# Patient Record
Sex: Female | Born: 1965
Health system: Southern US, Community
[De-identification: ages and names within clinical notes are randomized; demographics above are authoritative.]

## PROBLEM LIST (undated history)

## (undated) DIAGNOSIS — IMO0001 Reserved for inherently not codable concepts without codable children: Secondary | ICD-10-CM

## (undated) DIAGNOSIS — K219 Gastro-esophageal reflux disease without esophagitis: Secondary | ICD-10-CM

## (undated) DIAGNOSIS — T4145XA Adverse effect of unspecified anesthetic, initial encounter: Secondary | ICD-10-CM

## (undated) DIAGNOSIS — F329 Major depressive disorder, single episode, unspecified: Secondary | ICD-10-CM

## (undated) DIAGNOSIS — C50919 Malignant neoplasm of unspecified site of unspecified female breast: Secondary | ICD-10-CM

## (undated) DIAGNOSIS — D649 Anemia, unspecified: Secondary | ICD-10-CM

## (undated) DIAGNOSIS — T7840XA Allergy, unspecified, initial encounter: Secondary | ICD-10-CM

## (undated) DIAGNOSIS — T8859XA Other complications of anesthesia, initial encounter: Secondary | ICD-10-CM

## (undated) DIAGNOSIS — N189 Chronic kidney disease, unspecified: Secondary | ICD-10-CM

## (undated) DIAGNOSIS — I10 Essential (primary) hypertension: Secondary | ICD-10-CM

## (undated) DIAGNOSIS — Z9889 Other specified postprocedural states: Secondary | ICD-10-CM

## (undated) DIAGNOSIS — F32A Depression, unspecified: Secondary | ICD-10-CM

## (undated) DIAGNOSIS — R112 Nausea with vomiting, unspecified: Secondary | ICD-10-CM

## (undated) DIAGNOSIS — Z5189 Encounter for other specified aftercare: Secondary | ICD-10-CM

## (undated) DIAGNOSIS — F419 Anxiety disorder, unspecified: Secondary | ICD-10-CM

## (undated) HISTORY — DX: Major depressive disorder, single episode, unspecified: F32.9

## (undated) HISTORY — DX: Malignant neoplasm of unspecified site of unspecified female breast: C50.919

## (undated) HISTORY — DX: Essential (primary) hypertension: I10

## (undated) HISTORY — DX: Allergy, unspecified, initial encounter: T78.40XA

## (undated) HISTORY — PX: BREAST BIOPSY: SHX20

## (undated) HISTORY — DX: Depression, unspecified: F32.A

## (undated) HISTORY — PX: BREAST RECONSTRUCTION: SHX9

## (undated) HISTORY — DX: Encounter for other specified aftercare: Z51.89

## (undated) HISTORY — DX: Anxiety disorder, unspecified: F41.9

## (undated) HISTORY — PX: BACK SURGERY: SHX140

## (undated) HISTORY — PX: TONSILLECTOMY: SUR1361

---

## 1999-10-16 HISTORY — PX: ABDOMINAL HYSTERECTOMY: SHX81

## 2006-05-16 ENCOUNTER — Ambulatory Visit: Payer: Self-pay | Admitting: Unknown Physician Specialty

## 2006-06-07 ENCOUNTER — Ambulatory Visit: Payer: Self-pay | Admitting: Unknown Physician Specialty

## 2006-12-10 ENCOUNTER — Ambulatory Visit: Payer: Self-pay | Admitting: Unknown Physician Specialty

## 2007-06-10 ENCOUNTER — Ambulatory Visit: Payer: Self-pay | Admitting: Surgery

## 2007-06-13 ENCOUNTER — Ambulatory Visit: Payer: Self-pay | Admitting: Surgery

## 2007-09-04 ENCOUNTER — Ambulatory Visit: Payer: Self-pay

## 2008-05-12 ENCOUNTER — Ambulatory Visit: Payer: Self-pay | Admitting: Unknown Physician Specialty

## 2008-06-14 ENCOUNTER — Ambulatory Visit: Payer: Self-pay | Admitting: Surgery

## 2008-09-13 ENCOUNTER — Ambulatory Visit: Payer: Self-pay | Admitting: Unknown Physician Specialty

## 2009-06-13 ENCOUNTER — Ambulatory Visit: Payer: Self-pay | Admitting: Unknown Physician Specialty

## 2009-06-16 ENCOUNTER — Ambulatory Visit: Payer: Self-pay | Admitting: Surgery

## 2010-01-04 ENCOUNTER — Ambulatory Visit: Payer: Self-pay | Admitting: Unknown Physician Specialty

## 2010-06-20 ENCOUNTER — Ambulatory Visit: Payer: Self-pay | Admitting: Surgery

## 2010-10-23 ENCOUNTER — Ambulatory Visit: Payer: Self-pay | Admitting: Internal Medicine

## 2011-07-04 ENCOUNTER — Ambulatory Visit: Payer: Self-pay | Admitting: Surgery

## 2011-12-24 ENCOUNTER — Ambulatory Visit: Payer: Self-pay | Admitting: Surgery

## 2012-12-24 ENCOUNTER — Ambulatory Visit: Payer: Self-pay | Admitting: Surgery

## 2013-09-21 ENCOUNTER — Ambulatory Visit: Payer: Self-pay | Admitting: Physician Assistant

## 2013-12-29 ENCOUNTER — Ambulatory Visit: Payer: Self-pay | Admitting: Surgery

## 2015-01-12 ENCOUNTER — Encounter: Payer: Self-pay | Admitting: *Deleted

## 2015-08-01 ENCOUNTER — Other Ambulatory Visit: Payer: Self-pay | Admitting: Surgery

## 2015-08-01 DIAGNOSIS — N63 Unspecified lump in unspecified breast: Secondary | ICD-10-CM

## 2015-08-01 DIAGNOSIS — Z9289 Personal history of other medical treatment: Secondary | ICD-10-CM

## 2015-08-01 DIAGNOSIS — Z1231 Encounter for screening mammogram for malignant neoplasm of breast: Secondary | ICD-10-CM

## 2015-08-12 ENCOUNTER — Other Ambulatory Visit: Payer: Self-pay | Admitting: Surgery

## 2015-08-12 ENCOUNTER — Ambulatory Visit
Admission: RE | Admit: 2015-08-12 | Discharge: 2015-08-12 | Disposition: A | Discharge status: Home / Self Care | Payer: 59 | Source: Ambulatory Visit | Attending: Surgery | Admitting: Surgery

## 2015-08-12 DIAGNOSIS — N63 Unspecified lump in unspecified breast: Secondary | ICD-10-CM

## 2015-08-12 DIAGNOSIS — R920 Mammographic microcalcification found on diagnostic imaging of breast: Secondary | ICD-10-CM | POA: Diagnosis not present

## 2015-08-15 ENCOUNTER — Telehealth: Payer: Self-pay | Admitting: Surgery

## 2015-08-15 DIAGNOSIS — R921 Mammographic calcification found on diagnostic imaging of breast: Secondary | ICD-10-CM

## 2015-08-15 NOTE — Telephone Encounter (Signed)
Telephone conversation with Emma Atkinson. She stated that her mammogram was abnormal and they were suggesting biopsy in the breast Center. I have not been able to review her films personally but have reviewed the reports and made in in agreement with right ultrasound-guided core biopsy which is been ordered. I will follow up the results with her.

## 2015-08-16 ENCOUNTER — Other Ambulatory Visit: Payer: Self-pay | Admitting: Surgery

## 2015-08-16 DIAGNOSIS — N63 Unspecified lump in unspecified breast: Secondary | ICD-10-CM

## 2015-08-17 ENCOUNTER — Other Ambulatory Visit: Payer: Self-pay | Admitting: Surgery

## 2015-08-17 ENCOUNTER — Ambulatory Visit
Admission: RE | Admit: 2015-08-17 | Discharge: 2015-08-17 | Disposition: A | Discharge status: Home / Self Care | Payer: 59 | Source: Ambulatory Visit | Attending: Surgery | Admitting: Surgery

## 2015-08-17 DIAGNOSIS — N63 Unspecified lump in unspecified breast: Secondary | ICD-10-CM

## 2015-08-17 DIAGNOSIS — C50911 Malignant neoplasm of unspecified site of right female breast: Secondary | ICD-10-CM | POA: Insufficient documentation

## 2015-08-17 DIAGNOSIS — C50811 Malignant neoplasm of overlapping sites of right female breast: Secondary | ICD-10-CM | POA: Diagnosis not present

## 2015-08-17 DIAGNOSIS — C50919 Malignant neoplasm of unspecified site of unspecified female breast: Secondary | ICD-10-CM | POA: Insufficient documentation

## 2015-08-19 LAB — SURGICAL PATHOLOGY

## 2015-08-22 ENCOUNTER — Inpatient Hospital Stay: Payer: 59

## 2015-08-22 ENCOUNTER — Inpatient Hospital Stay: Payer: 59 | Attending: Hematology and Oncology | Admitting: Hematology and Oncology

## 2015-08-22 ENCOUNTER — Encounter: Payer: Self-pay | Admitting: Hematology and Oncology

## 2015-08-22 VITALS — BP 154/75 | HR 81 | Temp 96.6°F | Resp 18

## 2015-08-22 DIAGNOSIS — N390 Urinary tract infection, site not specified: Secondary | ICD-10-CM | POA: Diagnosis not present

## 2015-08-22 DIAGNOSIS — Z79899 Other long term (current) drug therapy: Secondary | ICD-10-CM | POA: Diagnosis not present

## 2015-08-22 DIAGNOSIS — F329 Major depressive disorder, single episode, unspecified: Secondary | ICD-10-CM | POA: Diagnosis not present

## 2015-08-22 DIAGNOSIS — F419 Anxiety disorder, unspecified: Secondary | ICD-10-CM | POA: Insufficient documentation

## 2015-08-22 DIAGNOSIS — Z170 Estrogen receptor positive status [ER+]: Secondary | ICD-10-CM | POA: Insufficient documentation

## 2015-08-22 DIAGNOSIS — C50811 Malignant neoplasm of overlapping sites of right female breast: Secondary | ICD-10-CM | POA: Insufficient documentation

## 2015-08-22 DIAGNOSIS — Z8041 Family history of malignant neoplasm of ovary: Secondary | ICD-10-CM | POA: Diagnosis not present

## 2015-08-22 DIAGNOSIS — Z803 Family history of malignant neoplasm of breast: Secondary | ICD-10-CM | POA: Diagnosis not present

## 2015-08-22 DIAGNOSIS — D242 Benign neoplasm of left breast: Secondary | ICD-10-CM | POA: Diagnosis not present

## 2015-08-22 DIAGNOSIS — Z7689 Persons encountering health services in other specified circumstances: Secondary | ICD-10-CM | POA: Insufficient documentation

## 2015-08-22 DIAGNOSIS — R232 Flushing: Secondary | ICD-10-CM | POA: Diagnosis not present

## 2015-08-22 DIAGNOSIS — Z90710 Acquired absence of both cervix and uterus: Secondary | ICD-10-CM | POA: Insufficient documentation

## 2015-08-22 DIAGNOSIS — C50111 Malignant neoplasm of central portion of right female breast: Secondary | ICD-10-CM

## 2015-08-22 DIAGNOSIS — Z5111 Encounter for antineoplastic chemotherapy: Secondary | ICD-10-CM | POA: Insufficient documentation

## 2015-08-22 DIAGNOSIS — Z87891 Personal history of nicotine dependence: Secondary | ICD-10-CM | POA: Diagnosis not present

## 2015-08-22 LAB — COMPREHENSIVE METABOLIC PANEL
ALT: 19 U/L (ref 14–54)
AST: 23 U/L (ref 15–41)
Albumin: 4.6 g/dL (ref 3.5–5.0)
Alkaline Phosphatase: 55 U/L (ref 38–126)
Anion gap: 7 (ref 5–15)
BUN: 12 mg/dL (ref 6–20)
CO2: 24 mmol/L (ref 22–32)
Calcium: 10.1 mg/dL (ref 8.9–10.3)
Chloride: 103 mmol/L (ref 101–111)
Creatinine, Ser: 0.73 mg/dL (ref 0.44–1.00)
GFR calc Af Amer: >60 mL/min (ref >60)
GFR calc non Af Amer: >60 mL/min (ref >60)
Glucose, Bld: 99 mg/dL (ref 65–99)
Potassium: 3.8 mmol/L (ref 3.5–5.1)
Sodium: 134 mmol/L — ABNORMAL LOW (ref 135–145)
Total Bilirubin: 0.9 mg/dL (ref 0.3–1.2)
Total Protein: 7.8 g/dL (ref 6.5–8.1)

## 2015-08-22 LAB — CBC WITH DIFFERENTIAL/PLATELET
Basophils Absolute: 0 10*3/uL (ref 0–0.1)
Basophils Relative: 0 %
Eosinophils Absolute: 0 10*3/uL (ref 0–0.7)
Eosinophils Relative: 1 %
HCT: 40.8 % (ref 35.0–47.0)
Hemoglobin: 13.7 g/dL (ref 12.0–16.0)
Lymphocytes Relative: 18 %
Lymphs Abs: 1.8 10*3/uL (ref 1.0–3.6)
MCH: 31.1 pg (ref 26.0–34.0)
MCHC: 33.6 g/dL (ref 32.0–36.0)
MCV: 92.4 fL (ref 80.0–100.0)
Monocytes Absolute: 0.4 10*3/uL (ref 0.2–0.9)
Monocytes Relative: 4 %
Neutro Abs: 7.9 10*3/uL — ABNORMAL HIGH (ref 1.4–6.5)
Neutrophils Relative %: 77 %
Platelets: 371 10*3/uL (ref 150–440)
RBC: 4.42 MIL/uL (ref 3.80–5.20)
RDW: 12.9 % (ref 11.5–14.5)
WBC: 10.2 10*3/uL (ref 3.6–11.0)

## 2015-08-22 NOTE — Progress Notes (Signed)
[facility] day:  08/22/2015  Chief Complaint: Emma Atkinson is a 49 y.o. female with clinical stage IIB right breast cancer s/p biopsy who is referred in consultation by Dr. Rochel Brome for assessment and management.  HPI:  The patient has had regular mammograms. She has noticed cysts now and then. As. She was due for follow-up mammogram in 12/2014 at. Mammogram was delayed. She then a right a right breast mass getting larger with inversion of the nipple. Mass was uncomfortable. She thus sought medical attention.  Bilateral mammogram and ultrasound on 08/12/2015 revealed a 4.1 x 2.3 x 4.0 cm cm highly suspicious right superior breast mass containing pleomorphic microcalcifications. There was increased internal vascularity.  There were several satellite lesions (1.3 cm, 0.9 cm, 0.6 cm) plus a 1.3 cm mass versus complicated cyst and a 0.8 cm cyst.  There was associated right axillary adenopathy (one 5 mm node with irregular thickened cortex).   In the left breast was 1.5 cm cyst containing intramural nodule versus debris at the 9:00 position 2 cm from the nipple. There was also an indeterminate left breast 7 mm nodule at the 2:00 position 4 cm from the nipple.  On 08/17/2015, she underwent core needle biopsy at the 12 o'clock position in the right breast.  Pathology revealed grade 3 invasive mammary carcinoma of no special type. Core sample was 1.2 cm. Right axillary biopsy confirmed metastatic mammary carcinoma. Left breast biopsy at the 9:00 position revealed intraductal papilloma with fibrocystic changes. Tumor was ER positive (1-10%), PR positive (11-50%) and HER-2/neu 3+  The patient notes a hysterectomy about 13 to 15 years ago. Her ovaries remain in place. She has an intermittent hot flashes. Menses was at age 61. First pregnancy at age 47. She did not breast feed her children.  She was on birth control pills from age 43 to [age].  She has not been on  any hormone replacement since her hysterectomy. She has 2 daughters who are alive and well.  Patient has a family history of breast cancer.  A maternal aunt had breast cancer at age 59. A maternal second cousin has breast cancer. A maternal aunt had ovarian cancer at age 71.  Past Medical History  Diagnosis Date  . Breast cancer ([place])   . Anxiety   . Depression   . Anemia     H/O  . Complication of anesthesia     HARD TO WAKE UP AFTER TONSILLECTOMY AS A CHILD BUT NO PROBLEMS SINCE    Past Surgical History  Procedure Laterality Date  . Breast biopsy Bilateral     08/17/2015   . Abdominal hysterectomy  2001  . Tonsillectomy    . Back surgery      FOR SCOLIOSIS-HERRINGTON RODS IN PLACE  . Portacath placement N/A 08/30/2015    Procedure: INSERTION PORT-A-CATH;  Surgeon: Leonie Green, MD;  Location: [facility] ORS;  Service: General;  Laterality: N/A;    Family History  Problem Relation Age of Onset  . Breast cancer Maternal Aunt 64  . Cancer Father   . Cancer Sister   . Cancer Maternal Grandfather     Social History:  reports that she quit smoking about 15 years ago. Her smoking use included Cigarettes. She has a 10 pack-year smoking history. She does not have any smokeless tobacco history on file. She reports that she drinks alcohol. She reports that she does not use illicit drugs.  The patient is accompanied  by her husband and mother today.  Allergies:  Allergies  Allergen Reactions  . Ceftin [Cefuroxime Axetil] Rash    Current Medications: Current Outpatient Prescriptions  Medication Sig Dispense Refill  . ALPRAZolam (XANAX) 0.25 MG tablet Take 1/4 tablet up to three times daily as needed    . diphenhydrAMINE (SOMINEX) 25 MG tablet Take 25 mg by mouth at bedtime.     Marland Kitchen HYDROcodone-acetaminophen (NORCO) 5-325 MG tablet Take 1-2 tablets by mouth every 4 (four) hours as needed for moderate pain. 12 tablet 0  . Multiple Vitamin (MULTIVITAMIN) capsule Take 1 capsule by  mouth daily.    Marland Kitchen triprolidine-pseudoephedrine (APRODINE) 2.5-60 MG TABS tablet Take 1 tablet by mouth every 6 (six) hours as needed for allergies.     No current facility-administered medications for this visit.    Review of Systems:  GENERAL:  Feels good.  Active.  No fevers, sweats or weight loss. PERFORMANCE STATUS (ECOG):  0 HEENT:  No visual changes, runny nose, sore throat, mouth sores or tenderness. Lungs: No shortness of breath or cough.  No hemoptysis. Cardiac:  No chest pain, palpitations, orthopnea, or PND. GI:  No nausea, vomiting, diarrhea, constipation, melena or hematochezia. GU:  No urgency, frequency, dysuria, or hematuria. Musculoskeletal:  No back pain.  No joint pain.  No muscle tenderness. Extremities:  No pain or swelling. Skin:  No rashes or skin changes. Neuro:  Sinus headaches.  No numbness or weakness, balance or coordination issues. Endocrine:  No diabetes, thyroid issues, hot flashes or night sweats. Psych:  Nervous/anxious.  No mood changes or depression. Pain:  No focal pain. Review of systems:  All other systems reviewed and found to be negative.  Physical Exam: Blood pressure 154/75, pulse 81, temperature 96.6 F (35.9 C), temperature source Tympanic, resp. rate 18. GENERAL:  Well developed, well nourished, sitting comfortably in the exam room in no acute distress. MENTAL STATUS:  Alert and oriented to person, place and time. HEAD:  Blonde frosted hair.  Normocephalic, atraumatic, face symmetric, no Cushingoid features. EYES:  Blue eyes.  Pupils equal round and reactive to light and accomodation.  No conjunctivitis or scleral icterus. ENT:  Oropharynx clear without lesion.  Tongue normal. Mucous membranes moist.  RESPIRATORY:  Clear to auscultation without rales, wheezes or rhonchi. CARDIOVASCULAR:  Regular rate and rhythm without murmur, rub or gallop. BREAST:  Right central breast mass measuring approximately 5 cm with associated nipple deformity.   No skin changes or nipple discharge.  Left breast with 2 cm mass at the 8-9 o'clock position and additional mass-like firmness in the 3 o'clock position. No skin changes or nipple discharge.  Steri-strips in right axillary region, right breast, and left breast. ABDOMEN:  Soft, non-tender, with active bowel sounds, and no hepatosplenomegaly.  No masses. BACK:  Scoliosis.  Long well healed midline scar. SKIN:  No rashes, ulcers or lesions. EXTREMITIES: No edema, no skin discoloration or tenderness.  No palpable cords. LYMPH NODES: No palpable cervical, supraclavicular, axillary or inguinal adenopathy  NEUROLOGICAL: Unremarkable. PSYCH:  Appropriate.  Appointment on 08/22/2015  Component Date Value Ref Range Status  . WBC 08/22/2015 10.2  3.6 - 11.0 K/uL Final  . RBC 08/22/2015 4.42  3.80 - 5.20 MIL/uL Final  . Hemoglobin 08/22/2015 13.7  12.0 - 16.0 g/dL Final  . HCT 08/22/2015 40.8  35.0 - 47.0 % Final  . MCV 08/22/2015 92.4  80.0 - 100.0 fL Final  . MCH 08/22/2015 31.1  26.0 - 34.0 pg Final  .  MCHC 08/22/2015 33.6  32.0 - 36.0 g/dL Final  . RDW 08/22/2015 12.9  11.5 - 14.5 % Final  . Platelets 08/22/2015 371  150 - 440 K/uL Final  . Neutrophils Relative % 08/22/2015 77   Final  . Neutro Abs 08/22/2015 7.9* 1.4 - 6.5 K/uL Final  . Lymphocytes Relative 08/22/2015 18   Final  . Lymphs Abs 08/22/2015 1.8  1.0 - 3.6 K/uL Final  . Monocytes Relative 08/22/2015 4   Final  . Monocytes Absolute 08/22/2015 0.4  0.2 - 0.9 K/uL Final  . Eosinophils Relative 08/22/2015 1   Final  . Eosinophils Absolute 08/22/2015 0.0  0 - 0.7 K/uL Final  . Basophils Relative 08/22/2015 0   Final  . Basophils Absolute 08/22/2015 0.0  0 - 0.1 K/uL Final  . Sodium 08/22/2015 134* 135 - 145 mmol/L Final  . Potassium 08/22/2015 3.8  3.5 - 5.1 mmol/L Final  . Chloride 08/22/2015 103  101 - 111 mmol/L Final  . CO2 08/22/2015 24  22 - 32 mmol/L Final  . Glucose, Bld 08/22/2015 99  65 - 99 mg/dL Final  . BUN 08/22/2015  12  6 - 20 mg/dL Final  . Creatinine, Ser 08/22/2015 0.73  0.44 - 1.00 mg/dL Final  . Calcium 08/22/2015 10.1  8.9 - 10.3 mg/dL Final  . Total Protein 08/22/2015 7.8  6.5 - 8.1 g/dL Final  . Albumin 08/22/2015 4.6  3.5 - 5.0 g/dL Final  . AST 08/22/2015 23  15 - 41 U/L Final  . ALT 08/22/2015 19  14 - 54 U/L Final  . Alkaline Phosphatase 08/22/2015 55  38 - 126 U/L Final  . Total Bilirubin 08/22/2015 0.9  0.3 - 1.2 mg/dL Final  . GFR calc non Af Amer 08/22/2015 >60  >60 mL/min Final  . GFR calc Af Amer 08/22/2015 >60  >60 mL/min Final   Comment: (NOTE) The eGFR has been calculated using the CKD EPI equation. This calculation has not been validated in all clinical situations. eGFR's persistently <60 mL/min signify possible Chronic Kidney Disease.   . Anion gap 08/22/2015 7  5 - 15 Final  . CA 27.29 08/22/2015 19.6  0.0 - 38.6 U/mL Final   Comment: (NOTE) [employer]ayer Cent[employer]/ACS methodology Performed At: [facility] [place], [place] [id number] Lindon Romp MD MS:[id number]   . [facility] 08/22/2015 5.0   Final   Comment: (NOTE)                    Follicular phase        3.5 -  12.5                    Ovulation phase         4.7 -  21.5                    Luteal phase            1.7 -   7.7                    Postmenopausal         25.8 - 134.8 Performed At: [facility] [place], [place] [id number] Lindon Romp MD LP:[id number]   . Estradiol 08/22/2015 271.9   Final   Comment: (NOTE)                    Adult Female:  Follicular phase   16.1 -   166.0                      Ovulation phase    85.8 -   498.0                      Luteal phase       43.8 -   211.0                      Postmenopausal     <6.0 -    54.7                    Pregnancy                      1st trimester     215.0 - >4300.0                    Girls (1-10 years)    6.0 -    27.0 Roche ECLIA methodology Performed At: [facility] [place], [place] [id number] Lindon Romp MD WJ:[id number]     Assessment:  Emma Atkinson is a 49 y.o. female with clinical stage T2N1 (stage IIB) Her2/neu + right breast cancer status post biopsy.  She presented with a minimally painful right breast mass with nipple inversion.  Bilateral mammogram and ultrasound on 08/12/2015 revealed a 4.1 cm right superior breast mass containing pleomorphic microcalcifications. There were several satellite lesions (1.3 cm, 0.9 cm, 0.6 cm) plus a 1.3 cm mass versus complicated cyst and a 0.8 cm cyst.  There was one 5 mm right axillary node with irregular thickened cortex.   Left breast revealed a 1.5 cm cyst containing intramural nodule versus debris at the 9:00 position 2 cm from the nipple. There was also an indeterminate left breast 7 mm nodule at the 2:00 position 4 cm from the nipple.  Left breast biopsy at the 9:00 position on 08/17/2015 revealed intraductal papilloma with fibrocystic changes.   Core needle biopsy on 08/17/2015 of the right breast mass revealed a grade 3 invasive mammary carcinoma. Right axillary biopsy confirmed metastatic mammary carcinoma. Tumor was ER positive (1-10%), PR positive (11-50%) and HER-2/neu 3+  She is s/p partial hysterectomy.  She has a family history of breast and ovarian cancer.  Symptomatically, she feels well.  Exam reveals a 5 cm  Right breast mass and significant changes in the left breast.  Plan: 1.  Review pathology and diagnosis of breast cancer.  Discuss staging.  Clinical stage IIB.  Discuss additional staging studies (PET scan).  Discuss My Risk genetic testing.  Discuss neoadjuvant versus adjuvant chemotherapy.  Discuss likely right mastectomy with axillary lymph node dissection.  Discuss aggressive nature of Her2/neu + disease.  Discuss in vivo response with neoadjuvant chemotherapy.  Discuss additional imaging of left breast.  Patient may not be a candidate for breast MRI given Herrington rods.   Discuss issues with following left breast.  Patient considering bilateral mastectomy.  2.  Discuss neoadjuant chemotherapy (AC q 2-3 weeks x 4 cycles with Neulasta support followed by weekly Taxol x 12 plus Perjeta x 4 cycles every 3 weeks with Herceptin x 1 year).  Discuss TCH x 6 cycles (Taxotere, carboplatin, Herceptin) followed by additional Herceptin to complete a year of Hereptin.  Side effects of chemotherapy were reviewed in detail.  Discuss surgery following  chemotherapy portion of treatment.  Discuss radiation therapy consult.  Discuss hormonal therapy (tamoxifen versus aromatase inhibitor).  Side effects reviewed.  Discuss assessing menopausal status (FSH, estradiol).  3.  Information on Adriamycin, Cytoxan, Taxol, Taxotere, carboplatin, Herceptin, and Perjeta.  4.  Schedule chemotherapy class. 5.  Labs today:  CBC with diff, CMP, CA27.29, FSH, estradiol. 6.  My Risk genetic testing. 7.  Schedule PET scan. 8.  Schedule echocardiogram- assess EF. 9.  Surgical consult (Dr Rochel Brome) for port-a-cath placement. 10. RTC after above to finalize treatment plan.   Lequita Asal, MD  08/22/2015, 8:50 AM

## 2015-08-22 NOTE — Patient Instructions (Signed)
Trastuzumab injection for infusion What is this medicine? TRASTUZUMAB (tras TOO zoo mab) is a monoclonal antibody. It is used to treat breast cancer and stomach cancer. This medicine may be used for other purposes; ask your health care provider or pharmacist if you have questions. What should I tell my health care provider before I take this medicine? They need to know if you have any of these conditions: -heart disease -heart failure -infection (especially a virus infection such as chickenpox, cold sores, or herpes) -lung or breathing disease, like asthma -recent or ongoing radiation therapy -an unusual or allergic reaction to trastuzumab, benzyl alcohol, or other medications, foods, dyes, or preservatives -pregnant or trying to get pregnant -breast-feeding How should I use this medicine? This drug is given as an infusion into a vein. It is administered in a hospital or clinic by a specially trained health care professional. Talk to your pediatrician regarding the use of this medicine in children. This medicine is not approved for use in children. Overdosage: If you think you have taken too much of this medicine contact a poison control center or emergency room at once. NOTE: This medicine is only for you. Do not share this medicine with others. What if I miss a dose? It is important not to miss a dose. Call your doctor or health care professional if you are unable to keep an appointment. What may interact with this medicine? -doxorubicin -warfarin This list may not describe all possible interactions. Give your health care provider a list of all the medicines, herbs, non-prescription drugs, or dietary supplements you use. Also tell them if you smoke, drink alcohol, or use illegal drugs. Some items may interact with your medicine. What should I watch for while using this medicine? Visit your doctor for checks on your progress. Report any side effects. Continue your course of treatment even  though you feel ill unless your doctor tells you to stop. Call your doctor or health care professional for advice if you get a fever, chills or sore throat, or other symptoms of a cold or flu. Do not treat yourself. Try to avoid being around people who are sick. You may experience fever, chills and shaking during your first infusion. These effects are usually mild and can be treated with other medicines. Report any side effects during the infusion to your health care professional. Fever and chills usually do not happen with later infusions. Do not become pregnant while taking this medicine or for 7 months after stopping it. Women should inform their doctor if they wish to become pregnant or think they might be pregnant. Women of child-bearing potential will need to have a negative pregnancy test before starting this medicine. There is a potential for serious side effects to an unborn child. Talk to your health care professional or pharmacist for more information. Do not breast-feed an infant while taking this medicine or for 7 months after stopping it. Women must use effective birth control with this medicine. What side effects may I notice from receiving this medicine? Side effects that you should report to your doctor or other health care professional as soon as possible: -breathing difficulties -chest pain or palpitations -cough -dizziness or fainting -fever or chills, sore throat -skin rash, itching or hives -swelling of the legs or ankles -unusually weak or tired Side effects that usually do not require medical attention (report to your doctor or other health care professional if they continue or are bothersome): -loss of appetite -headache -muscle aches -nausea This   list may not describe all possible side effects. Call your doctor for medical advice about side effects. You may report side effects to FDA at 1-800-FDA-1088. Where should I keep my medicine? This drug is given in a hospital  or clinic and will not be stored at home. NOTE: This sheet is a summary. It may not cover all possible information. If you have questions about this medicine, talk to your doctor, pharmacist, or health care provider.    2016, Elsevier/[employer] Standard. (2015-01-07 11:49:32) Pertuzumab injection What is this medicine? PERTUZUMAB (per TOOZ ue mab) is a monoclonal antibody. It is used to treat breast cancer. This medicine may be used for other purposes; ask your health care provider or pharmacist if you have questions. What should I tell my health care provider before I take this medicine? They need to know if you have any of these conditions: -heart disease -heart failure -high blood pressure -history of irregular heart beat -recent or ongoing radiation therapy -an unusual or allergic reaction to pertuzumab, other medicines, foods, dyes, or preservatives -pregnant or trying to get pregnant -breast-feeding How should I use this medicine? This medicine is for infusion into a vein. It is given by a health care professional in a hospital or clinic setting. Talk to your pediatrician regarding the use of this medicine in children. Special care may be needed. Overdosage: If you think you have taken too much of this medicine contact a poison control center or emergency room at once. NOTE: This medicine is only for you. Do not share this medicine with others. What if I miss a dose? It is important not to miss your dose. Call your doctor or health care professional if you are unable to keep an appointment. What may interact with this medicine? Interactions are not expected. Give your health care provider a list of all the medicines, herbs, non-prescription drugs, or dietary supplements you use. Also tell them if you smoke, drink alcohol, or use illegal drugs. Some items may interact with your medicine. This list may not describe all possible interactions. Give your health care provider a list of all  the medicines, herbs, non-prescription drugs, or dietary supplements you use. Also tell them if you smoke, drink alcohol, or use illegal drugs. Some items may interact with your medicine. What should I watch for while using this medicine? Your condition will be monitored carefully while you are receiving this medicine. Report any side effects. Continue your course of treatment even though you feel ill unless your doctor tells you to stop. Do not become pregnant while taking this medicine or for 7 months after stopping it. Women should inform their doctor if they wish to become pregnant or think they might be pregnant. Women of child-bearing potential will need to have a negative pregnancy test before starting this medicine. There is a potential for serious side effects to an unborn child. Talk to your health care professional or pharmacist for more information. Do not breast-feed an infant while taking this medicine or for 7 months after stopping it. Women must use effective birth control with this medicine. Call your doctor or health care professional for advice if you get a fever, chills or sore throat, or other symptoms of a cold or flu. Do not treat yourself. Try to avoid being around people who are sick. You may experience fever, chills, and headache during the infusion. Report any side effects during the infusion to your health care professional. What side effects may I notice from receiving  this medicine? Side effects that you should report to your doctor or health care professional as soon as possible: -breathing problems -chest pain or palpitations -dizziness -feeling faint or lightheaded -fever or chills -skin rash, itching or hives -sore throat -swelling of the face, lips, or tongue -swelling of the legs or ankles -unusually weak or tired Side effects that usually do not require medical attention (Report these to your doctor or health care professional if they continue or are  bothersome.): -diarrhea -hair loss -nausea, vomiting -tiredness This list may not describe all possible side effects. Call your doctor for medical advice about side effects. You may report side effects to FDA at 1-800-FDA-1088. Where should I keep my medicine? This drug is given in a hospital or clinic and will not be stored at home. NOTE: This sheet is a summary. It may not cover all possible information. If you have questions about this medicine, talk to your doctor, pharmacist, or health care provider.    2016, Elsevier/Gold Standard. (2015-01-07 16:07:57) Docetaxel injection What is this medicine? DOCETAXEL (doe se TAX el) is a chemotherapy drug. It targets fast dividing cells, like cancer cells, and causes these cells to die. This medicine is used to treat many types of cancers like breast cancer, certain stomach cancers, head and neck cancer, lung cancer, and prostate cancer. This medicine may be used for other purposes; ask your health care provider or pharmacist if you have questions. What should I tell my health care provider before I take this medicine? They need to know if you have any of these conditions: -infection (especially a virus infection such as chickenpox, cold sores, or herpes) -liver disease -low blood counts, like low white cell, platelet, or red cell counts -an unusual or allergic reaction to docetaxel, polysorbate 80, other chemotherapy agents, other medicines, foods, dyes, or preservatives -pregnant or trying to get pregnant -breast-feeding How should I use this medicine? This drug is given as an infusion into a vein. It is administered in a hospital or clinic by a specially trained health care professional. Talk to your pediatrician regarding the use of this medicine in children. Special care may be needed. Overdosage: If you think you have taken too much of this medicine contact a poison control center or emergency room at once. NOTE: This medicine is only for  you. Do not share this medicine with others. What if I miss a dose? It is important not to miss your dose. Call your doctor or health care professional if you are unable to keep an appointment. What may interact with this medicine? -cyclosporine -erythromycin -ketoconazole -medicines to increase blood counts like filgrastim, pegfilgrastim, sargramostim -vaccines Talk to your doctor or health care professional before taking any of these medicines: -acetaminophen -aspirin -ibuprofen -ketoprofen -naproxen This list may not describe all possible interactions. Give your health care provider a list of all the medicines, herbs, non-prescription drugs, or dietary supplements you use. Also tell them if you smoke, drink alcohol, or use illegal drugs. Some items may interact with your medicine. What should I watch for while using this medicine? Your condition will be monitored carefully while you are receiving this medicine. You will need important blood work done while you are taking this medicine. This drug may make you feel generally unwell. This is not uncommon, as chemotherapy can affect healthy cells as well as cancer cells. Report any side effects. Continue your course of treatment even though you feel ill unless your doctor tells you to stop. In some  cases, you may be given additional medicines to help with side effects. Follow all directions for their use. Call your doctor or health care professional for advice if you get a fever, chills or sore throat, or other symptoms of a cold or flu. Do not treat yourself. This drug decreases your body's ability to fight infections. Try to avoid being around people who are sick. This medicine may increase your risk to bruise or bleed. Call your doctor or health care professional if you notice any unusual bleeding. This medicine may contain alcohol in the product. You may get drowsy or dizzy. Do not drive, use machinery, or do anything that needs mental  alertness until you know how this medicine affects you. Do not stand or sit up quickly, especially if you are an older patient. This reduces the risk of dizzy or fainting spells. Avoid alcoholic drinks. Do not become pregnant while taking this medicine. Women should inform their doctor if they wish to become pregnant or think they might be pregnant. There is a potential for serious side effects to an unborn child. Talk to your health care professional or pharmacist for more information. Do not breast-feed an infant while taking this medicine. What side effects may I notice from receiving this medicine? Side effects that you should report to your doctor or health care professional as soon as possible: -allergic reactions like skin rash, itching or hives, swelling of the face, lips, or tongue -low blood counts - This drug may decrease the number of white blood cells, red blood cells and platelets. You may be at increased risk for infections and bleeding. -signs of infection - fever or chills, cough, sore throat, pain or difficulty passing urine -signs of decreased platelets or bleeding - bruising, pinpoint red spots on the skin, black, tarry stools, nosebleeds -signs of decreased red blood cells - unusually weak or tired, fainting spells, lightheadedness -breathing problems -fast or irregular heartbeat -low blood pressure -mouth sores -nausea and vomiting -pain, swelling, redness or irritation at the injection site -pain, tingling, numbness in the hands or feet -swelling of the ankle, feet, hands -weight gain Side effects that usually do not require medical attention (report to your prescriber or health care professional if they continue or are bothersome): -bone pain -complete hair loss including hair on your head, underarms, pubic hair, eyebrows, and eyelashes -diarrhea -excessive tearing -changes in the color of fingernails -loosening of the fingernails -nausea -muscle pain -red flush to  skin -sweating -weak or tired This list may not describe all possible side effects. Call your doctor for medical advice about side effects. You may report side effects to FDA at 1-800-FDA-1088. Where should I keep my medicine? This drug is given in a hospital or clinic and will not be stored at home. NOTE: This sheet is a summary. It may not cover all possible information. If you have questions about this medicine, talk to your doctor, pharmacist, or health care provider.    2016, [employer]/Gold Standard. (2014-10-18 16:04:57) Carboplatin injection What is this medicine? CARBOPLATIN (KAR boe pla tin) is a chemotherapy drug. It targets fast dividing cells, like cancer cells, and causes these cells to die. This medicine is used to treat ovarian cancer and many other cancers. This medicine may be used for other purposes; ask your health care provider or pharmacist if you have questions. What should I tell my health care provider before I take this medicine? They need to know if you have any of these conditions: -blood disorders -  hearing problems -kidney disease -recent or ongoing radiation therapy -an unusual or allergic reaction to carboplatin, cisplatin, other chemotherapy, other medicines, foods, dyes, or preservatives -pregnant or trying to get pregnant -breast-feeding How should I use this medicine? This drug is usually given as an infusion into a vein. It is administered in a hospital or clinic by a specially trained health care professional. Talk to your pediatrician regarding the use of this medicine in children. Special care may be needed. Overdosage: If you think you have taken too much of this medicine contact a poison control center or emergency room at once. NOTE: This medicine is only for you. Do not share this medicine with others. What if I miss a dose? It is important not to miss a dose. Call your doctor or health care professional if you are unable to keep an  appointment. What may interact with this medicine? -medicines for seizures -medicines to increase blood counts like filgrastim, pegfilgrastim, sargramostim -some antibiotics like amikacin, gentamicin, neomycin, streptomycin, tobramycin -vaccines Talk to your doctor or health care professional before taking any of these medicines: -acetaminophen -aspirin -ibuprofen -ketoprofen -naproxen This list may not describe all possible interactions. Give your health care provider a list of all the medicines, herbs, non-prescription drugs, or dietary supplements you use. Also tell them if you smoke, drink alcohol, or use illegal drugs. Some items may interact with your medicine. What should I watch for while using this medicine? Your condition will be monitored carefully while you are receiving this medicine. You will need important blood work done while you are taking this medicine. This drug may make you feel generally unwell. This is not uncommon, as chemotherapy can affect healthy cells as well as cancer cells. Report any side effects. Continue your course of treatment even though you feel ill unless your doctor tells you to stop. In some cases, you may be given additional medicines to help with side effects. Follow all directions for their use. Call your doctor or health care professional for advice if you get a fever, chills or sore throat, or other symptoms of a cold or flu. Do not treat yourself. This drug decreases your body's ability to fight infections. Try to avoid being around people who are sick. This medicine may increase your risk to bruise or bleed. Call your doctor or health care professional if you notice any unusual bleeding. Be careful brushing and flossing your teeth or using a toothpick because you may get an infection or bleed more easily. If you have any dental work done, tell your dentist you are receiving this medicine. Avoid taking products that contain aspirin, acetaminophen,  ibuprofen, naproxen, or ketoprofen unless instructed by your doctor. These medicines may hide a fever. Do not become pregnant while taking this medicine. Women should inform their doctor if they wish to become pregnant or think they might be pregnant. There is a potential for serious side effects to an unborn child. Talk to your health care professional or pharmacist for more information. Do not breast-feed an infant while taking this medicine. What side effects may I notice from receiving this medicine? Side effects that you should report to your doctor or health care professional as soon as possible: -allergic reactions like skin rash, itching or hives, swelling of the face, lips, or tongue -signs of infection - fever or chills, cough, sore throat, pain or difficulty passing urine -signs of decreased platelets or bleeding - bruising, pinpoint red spots on the skin, black, tarry stools, nosebleeds -signs  of decreased red blood cells - unusually weak or tired, fainting spells, lightheadedness -breathing problems -changes in hearing -changes in vision -chest pain -high blood pressure -low blood counts - This drug may decrease the number of white blood cells, red blood cells and platelets. You may be at increased risk for infections and bleeding. -nausea and vomiting -pain, swelling, redness or irritation at the injection site -pain, tingling, numbness in the hands or feet -problems with balance, talking, walking -trouble passing urine or change in the amount of urine Side effects that usually do not require medical attention (report to your doctor or health care professional if they continue or are bothersome): -hair loss -loss of appetite -metallic taste in the mouth or changes in taste This list may not describe all possible side effects. Call your doctor for medical advice about side effects. You may report side effects to FDA at 1-800-FDA-1088. Where should I keep my medicine? This drug  is given in a hospital or clinic and will not be stored at home. NOTE: This sheet is a summary. It may not cover all possible information. If you have questions about this medicine, talk to your doctor, pharmacist, or health care provider.    2016, Elsevier/Gold Standard. (2008-01-06 14:38:05)

## 2015-08-23 ENCOUNTER — Telehealth: Payer: Self-pay | Admitting: *Deleted

## 2015-08-23 ENCOUNTER — Encounter: Payer: Self-pay | Admitting: *Deleted

## 2015-08-23 LAB — ESTRADIOL: Estradiol: 271.9 pg/mL

## 2015-08-23 LAB — FOLLICLE STIMULATING HORMONE: FSH: 5 m[IU]/mL

## 2015-08-23 LAB — CANCER ANTIGEN 27.29: CA 27.29: 19.6 U/mL (ref 0.0–38.6)

## 2015-08-23 NOTE — Telephone Encounter (Signed)
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 I will advise her to call PMD

## 2015-08-23 NOTE — Telephone Encounter (Signed)
  Has she had chicken pox before?  Received the varicella vaccine?  What was the extent of the exposure?  She should talk to her PCP about this.  M

## 2015-08-23 NOTE — Progress Notes (Signed)
  Oncology Nurse Navigator Documentation    Navigator Encounter Type: Initial MedOnc (08/23/15 1200) Patient Visit Type: Initial (08/23/15 1200) Treatment Phase: Other (08/23/15 1200) Barriers/Navigation Needs: Education (08/23/15 1200) Education: Newly Diagnosed Cancer Education (08/23/15 1200)    Met patient today at the [facility] in [place].  She was seen yesterday as a new consult with Dr. Mike Gip, but I was unable to meet her at the time.  Offered support. Gave patient breast cancer educational literature, "My Breast Cancer Treatment Handbook" by Josephine Igo, RN.  Reviewed services offered at the cancer center.  She is to call if she has any questions or needs.      Support Groups/Services: Breast (08/23/15 1200) Specialty Items/DME: Wigs (08/23/15 1200) Time Spent with Patient: 60 (08/23/15 1200)

## 2015-08-23 NOTE — Telephone Encounter (Signed)
  Has she had chickenpox before?  If so, she is likely immune.  Did she get the vaccine in the past?  Unless she touched his skin/lesions, she will probably be fine.  M

## 2015-08-23 NOTE — Telephone Encounter (Signed)
Called to report that she has been exposed to Shingles. Does she need to do anything?

## 2015-08-24 ENCOUNTER — Other Ambulatory Visit: Payer: 59

## 2015-08-24 ENCOUNTER — Encounter: Payer: Self-pay | Admitting: *Deleted

## 2015-08-24 NOTE — Patient Instructions (Signed)
  Your procedure is scheduled on: 08-30-15 Report to [facility] To find out your arrival time please call [phone] between 1PM - 3PM on 08-29-15  Remember: Instructions that are not followed completely may result in serious medical risk, up to and including death, or upon the discretion of your surgeon and anesthesiologist your surgery may need to be rescheduled.    _X___ 1. Do not eat food or drink liquids after midnight. No gum chewing or hard candies.     _X___ 2. No Alcohol for 24 hours before or after surgery.   ____ 3. Bring all medications with you on the day of surgery if instructed.    ____ 4. Notify your doctor if there is any change in your medical condition     (cold, fever, infections).     Do not wear jewelry, make-up, hairpins, clips or nail polish.  Do not wear lotions, powders, or perfumes. You may wear deodorant.  Do not shave 48 hours prior to surgery. Men may shave face and neck.  Do not bring valuables to the hospital.    [facility] is not responsible for any belongings or valuables.               Contacts, dentures or bridgework may not be worn into surgery.  Leave your suitcase in the car. After surgery it may be brought to your room.  For patients admitted to the hospital, discharge time is determined by your treatment team.   Patients discharged the day of surgery will not be allowed to drive home.   Please read over the following fact sheets that you were given:      ____ Take these medicines the morning of surgery with A SIP OF WATER:    1. MAY TAKE XANAX IF NEEDED AM OF SURGERY WITH A SMALL SIP OF WATER  2.   3.   4.  5.  6.  ____ Fleet Enema (as directed)   ____ Use CHG Soap as directed  ____ Use inhalers on the day of surgery  ____ Stop metformin 2 days prior to surgery    ____ Take 1/2 of usual insulin dose the night before surgery and none on the morning of surgery.   ____ Stop  Coumadin/Plavix/aspirin-N/A  ____ Stop Anti-inflammatories-NO NSAIDS OR ASA PRODUCTS-TYLENOL OK   ____ Stop supplements until after surgery.    ____ Bring C-Pap to the hospital.

## 2015-08-25 ENCOUNTER — Encounter: Payer: Self-pay | Admitting: Radiation Oncology

## 2015-08-25 ENCOUNTER — Inpatient Hospital Stay: Payer: 59

## 2015-08-25 ENCOUNTER — Ambulatory Visit
Admission: RE | Admit: 2015-08-25 | Discharge: 2015-08-25 | Disposition: A | Discharge status: Home / Self Care | Payer: 59 | Source: Ambulatory Visit | Attending: Radiation Oncology | Admitting: Radiation Oncology

## 2015-08-25 VITALS — BP 159/79 | HR 100 | Temp 99.5°F | Resp 20 | Ht 61.0 in | Wt 110.9 lb

## 2015-08-25 DIAGNOSIS — C50911 Malignant neoplasm of unspecified site of right female breast: Secondary | ICD-10-CM

## 2015-08-25 NOTE — Consult Note (Signed)
Except an outstanding is perfect of Radiation Oncology NEW PATIENT EVALUATION  Name: Emma Atkinson  MRN: [id number]  Date:   08/25/2015     DOB: 1966/08/02   This 49 y.o. female patient presents to the clinic for initial evaluation of stage III invasive mammary carcinoma the right breast for treatment option discussion and evaluation.  REFERRING PHYSICIAN: No ref. provider found  CHIEF COMPLAINT:  Chief Complaint  Patient presents with  . Cancer    here for consultation    DIAGNOSIS: The encounter diagnosis was Malignant neoplasm of right female breast, unspecified site of breast ([place]).   PREVIOUS INVESTIGATIONS:  Mammogram and ultrasound reviewed Pathology report reviewed Clinical notes reviewed  HPI: Patient is a pleasant 49 year old female family member of one of my previous patients who presented with a self discovered mass in the 12:00 position of the right breast. Mammogram showed a 3.4 x 3.5 x 4.6 cm mass in the right breast ill-defined associated with pleomorphic microcalcifications. There is also an abnormal right axillary lymph node measuring 5 mm. There was also a cyst noted in the left breast both lesions as well as her axillary lymph node were sampled. The left breast was intraductal papilloma. Right breast had an invasive mammary carcinoma measuring 1.2 cm overall grade 3 at the 12:00 position. Her axillary lymph node also showed metastatic invasive mammary carcinoma. She's been seen by medical oncology. They're planning to do neoadjuvant chemotherapy and then possible wide local excision and sentinel node biopsy and I been asked to render opinion regarding radiation therapy treatments. She is otherwise doing well. She scheduled to have a port placed in her left anterior chest wall. There are some associated areas of abnormality on her recent mammogram although I have explained to the patient and MRI scan may be performed prior to doing definitive surgery in the  future.  PLANNED TREATMENT REGIMEN: New adjuvant chemotherapy followed by possible wide local excision and sentinel node biopsy and adjuvant radiation therapy  PAST MEDICAL HISTORY:  has a past medical history of Breast cancer ([place]); Anxiety; Depression; Anemia; and Complication of anesthesia.    PAST SURGICAL HISTORY:  Past Surgical History  Procedure Laterality Date  . Breast biopsy Bilateral     08/17/2015   . Abdominal hysterectomy  2001  . Tonsillectomy    . Back surgery      FOR SCOLIOSIS-HERRINGTON RODS IN PLACE    FAMILY HISTORY: family history includes Breast cancer (age of onset: 62) in her maternal aunt; Cancer in her father, maternal grandfather, and sister.  SOCIAL HISTORY:  reports that she quit smoking about 15 years ago. Her smoking use included Cigarettes. She has a 10 pack-year smoking history. She does not have any smokeless tobacco history on file. She reports that she drinks alcohol. She reports that she does not use illicit drugs.  ALLERGIES: Ceftin  MEDICATIONS:  Current Outpatient Prescriptions  Medication Sig Dispense Refill  . ALPRAZolam (XANAX) 0.25 MG tablet Take 1/4 tablet up to three times daily as needed    . diphenhydrAMINE (SOMINEX) 25 MG tablet Take 25 mg by mouth at bedtime.     . Multiple Vitamin (MULTIVITAMIN) capsule Take 1 capsule by mouth daily.    Marland Kitchen triprolidine-pseudoephedrine (APRODINE) 2.5-60 MG TABS tablet Take 1 tablet by mouth every 6 (six) hours as needed for allergies.     No current facility-administered medications for this encounter.    ECOG PERFORMANCE STATUS:  0 - Asymptomatic  REVIEW OF SYSTEMS:  Patient denies  any weight loss, fatigue, weakness, fever, chills or night sweats. Patient denies any loss of vision, blurred vision. Patient denies any ringing  of the ears or hearing loss. No irregular heartbeat. Patient denies heart murmur or history of fainting. Patient denies any chest pain or pain radiating to her upper  extremities. Patient denies any shortness of breath, difficulty breathing at night, cough or hemoptysis. Patient denies any swelling in the lower legs. Patient denies any nausea vomiting, vomiting of blood, or coffee ground material in the vomitus. Patient denies any stomach pain. Patient states has had normal bowel movements no significant constipation or diarrhea. Patient denies any dysuria, hematuria or significant nocturia. Patient denies any problems walking, swelling in the joints or loss of balance. Patient denies any skin changes, loss of hair or loss of weight. Patient denies any excessive worrying or anxiety or significant depression. Patient denies any problems with insomnia. Patient denies excessive thirst, polyuria, polydipsia. Patient denies any swollen glands, patient denies easy bruising or easy bleeding. Patient denies any recent infections, allergies or URI. Patient "s visual fields have not changed significantly in recent time.    PHYSICAL EXAM: BP 159/79 mmHg  Pulse 100  Temp(Src) 99.5 F (37.5 C) (Tympanic)  Resp 20  Ht 5\' 1"  (1.549 m)  Wt 110 lb 14.3 oz (50.3 kg)  BMI 20.96 kg/m2 Well-developed female in NAD. She has bilateral nipple inversion. There is a firm 4 cm mass present the 12:00 position of the right breast. No other dominant mass or nodularity is noted in either breast in 2 positions examined. No supraclavicular or axillary adenopathy is appreciated. Well-developed well-nourished patient in NAD. HEENT reveals PERLA, EOMI, discs not visualized.  Oral cavity is clear. No oral mucosal lesions are identified. Neck is clear without evidence of cervical or supraclavicular adenopathy. Lungs are clear to A&P. Cardiac examination is essentially unremarkable with regular rate and rhythm without murmur rub or thrill. Abdomen is benign with no organomegaly or masses noted. Motor sensory and DTR levels are equal and symmetric in the upper and lower extremities. Cranial nerves II  through XII are grossly intact. Proprioception is intact. No peripheral adenopathy or edema is identified. No motor or sensory levels are noted. Crude visual fields are within normal range.  LABORATORY DATA: Pathology reports reviewed    RADIOLOGY RESULTS: Mammograms and ultrasound reviewed   IMPRESSION: Stage III invasive mammary carcinoma the right breast for neoadjuvant chemotherapy followed by surgery and possible adjuvant radiation therapy  PLAN: I've explained to the patient and her family the sequencing of neoadjuvant chemotherapy in the hopes that the tumor size can shrink and patient would be able to undergo wide local excision. She would also need possible sentinel node biopsy at the time of surgery. Should there be no significant shrinkage in the mass she may be candidate for mastectomy. I would otherwise if she has wide local excision and depending on surgical pathology findings if she has a mastectomy recommend adjuvant radiation therapy. That will be determined at the time of final pathology. Risks and benefits of radiation including skin reaction, fatigue, alteration of blood counts, inclusion of some superficial lung, and possible lymphedema in her right upper extremity all were explained in detail to the patient. I will see her in follow-up after completion of her surgery for reevaluation. Patient and her family know to call with any concerns. As stated above also MRI scan may be indicated prior to any definitive surgical excision based on the multiple abnormalities in her right  breast.  I would like to take this opportunity for allowing me to participate in the care of your patient.Armstead Peaks., MD

## 2015-08-25 NOTE — Progress Notes (Signed)
Here to day for consult. States she has had a sinus headache for the past two days. No other complaints.

## 2015-08-26 ENCOUNTER — Encounter
Admission: RE | Admit: 2015-08-26 | Discharge: 2015-08-26 | Disposition: A | Discharge status: Home / Self Care | Payer: 59 | Source: Ambulatory Visit | Attending: Hematology and Oncology | Admitting: Hematology and Oncology

## 2015-08-26 ENCOUNTER — Ambulatory Visit
Admission: RE | Admit: 2015-08-26 | Discharge: 2015-08-26 | Disposition: A | Discharge status: Home / Self Care | Payer: 59 | Source: Ambulatory Visit | Attending: Hematology and Oncology | Admitting: Hematology and Oncology

## 2015-08-26 DIAGNOSIS — R590 Localized enlarged lymph nodes: Secondary | ICD-10-CM | POA: Diagnosis not present

## 2015-08-26 DIAGNOSIS — I340 Nonrheumatic mitral (valve) insufficiency: Secondary | ICD-10-CM | POA: Insufficient documentation

## 2015-08-26 DIAGNOSIS — C50111 Malignant neoplasm of central portion of right female breast: Secondary | ICD-10-CM | POA: Insufficient documentation

## 2015-08-26 DIAGNOSIS — M4184 Other forms of scoliosis, thoracic region: Secondary | ICD-10-CM | POA: Diagnosis not present

## 2015-08-26 DIAGNOSIS — Z01810 Encounter for preprocedural cardiovascular examination: Secondary | ICD-10-CM | POA: Diagnosis present

## 2015-08-26 DIAGNOSIS — Z08 Encounter for follow-up examination after completed treatment for malignant neoplasm: Secondary | ICD-10-CM | POA: Diagnosis present

## 2015-08-26 LAB — GLUCOSE, CAPILLARY: Glucose-Capillary: 70 mg/dL (ref 65–99)

## 2015-08-26 MED ORDER — FLUDEOXYGLUCOSE F - 18 (FDG) INJECTION
12.5400 | Freq: Once | INTRAVENOUS | Status: DC | PRN
  Administered 2015-08-26: 12.54 via INTRAVENOUS
  Filled 2015-08-26: qty 12.54

## 2015-08-26 NOTE — Progress Notes (Signed)
*  PRELIMINARY RESULTS* Echocardiogram 2D Echocardiogram has been performed.  Emma Atkinson 08/26/2015, 12:51 PM

## 2015-08-30 ENCOUNTER — Encounter: Payer: Self-pay | Admitting: *Deleted

## 2015-08-30 ENCOUNTER — Ambulatory Visit: Payer: 59

## 2015-08-30 ENCOUNTER — Ambulatory Visit
Admission: RE | Admit: 2015-08-30 | Discharge: 2015-08-30 | Disposition: A | Discharge status: Home / Self Care | Payer: 59 | Source: Ambulatory Visit | Attending: Surgery | Admitting: Surgery

## 2015-08-30 ENCOUNTER — Ambulatory Visit: Payer: 59 | Admitting: Anesthesiology

## 2015-08-30 ENCOUNTER — Encounter
Admission: RE | Disposition: A | Discharge status: Home / Self Care | Payer: Self-pay | Source: Ambulatory Visit | Attending: Surgery

## 2015-08-30 DIAGNOSIS — Z808 Family history of malignant neoplasm of other organs or systems: Secondary | ICD-10-CM | POA: Diagnosis not present

## 2015-08-30 DIAGNOSIS — Z90710 Acquired absence of both cervix and uterus: Secondary | ICD-10-CM | POA: Diagnosis not present

## 2015-08-30 DIAGNOSIS — F418 Other specified anxiety disorders: Secondary | ICD-10-CM | POA: Diagnosis not present

## 2015-08-30 DIAGNOSIS — Z87891 Personal history of nicotine dependence: Secondary | ICD-10-CM | POA: Insufficient documentation

## 2015-08-30 DIAGNOSIS — Z803 Family history of malignant neoplasm of breast: Secondary | ICD-10-CM | POA: Diagnosis not present

## 2015-08-30 DIAGNOSIS — Z8042 Family history of malignant neoplasm of prostate: Secondary | ICD-10-CM | POA: Insufficient documentation

## 2015-08-30 DIAGNOSIS — Z8249 Family history of ischemic heart disease and other diseases of the circulatory system: Secondary | ICD-10-CM | POA: Diagnosis not present

## 2015-08-30 DIAGNOSIS — Z881 Allergy status to other antibiotic agents status: Secondary | ICD-10-CM | POA: Diagnosis not present

## 2015-08-30 DIAGNOSIS — C50811 Malignant neoplasm of overlapping sites of right female breast: Secondary | ICD-10-CM

## 2015-08-30 DIAGNOSIS — C50211 Malignant neoplasm of upper-inner quadrant of right female breast: Secondary | ICD-10-CM | POA: Diagnosis not present

## 2015-08-30 DIAGNOSIS — Z79899 Other long term (current) drug therapy: Secondary | ICD-10-CM | POA: Diagnosis not present

## 2015-08-30 HISTORY — DX: Anemia, unspecified: D64.9

## 2015-08-30 HISTORY — PX: PORTACATH PLACEMENT: SHX2246

## 2015-08-30 HISTORY — DX: Other complications of anesthesia, initial encounter: T88.59XA

## 2015-08-30 HISTORY — DX: Adverse effect of unspecified anesthetic, initial encounter: T41.45XA

## 2015-08-30 SURGERY — INSERTION, TUNNELED CENTRAL VENOUS DEVICE, WITH PORT
Anesthesia: General

## 2015-08-30 MED ORDER — FAMOTIDINE 20 MG PO TABS
20.0000 mg | ORAL_TABLET | Freq: Once | ORAL | Status: AC
  Administered 2015-08-30: 20 mg via ORAL

## 2015-08-30 MED ORDER — FENTANYL CITRATE (PF) 100 MCG/2ML IJ SOLN
25.0000 ug | INTRAMUSCULAR | Status: DC | PRN
  Administered 2015-08-30 (×2): 25 ug via INTRAVENOUS

## 2015-08-30 MED ORDER — FENTANYL CITRATE (PF) 100 MCG/2ML IJ SOLN
INTRAMUSCULAR | Status: AC
  Administered 2015-08-30: 25 ug via INTRAVENOUS
  Filled 2015-08-30: qty 2

## 2015-08-30 MED ORDER — SODIUM CHLORIDE 0.9 % IJ SOLN
INTRAMUSCULAR | Status: AC
  Filled 2015-08-30: qty 50

## 2015-08-30 MED ORDER — PHENYLEPHRINE HCL 10 MG/ML IJ SOLN
INTRAMUSCULAR | Status: DC | PRN
  Administered 2015-08-30: 200 ug via INTRAVENOUS

## 2015-08-30 MED ORDER — VANCOMYCIN HCL IN DEXTROSE 1-5 GM/200ML-% IV SOLN
1000.0000 mg | Freq: Once | INTRAVENOUS | Status: AC
  Administered 2015-08-30: 1000 mg via INTRAVENOUS

## 2015-08-30 MED ORDER — VANCOMYCIN HCL IN DEXTROSE 1-5 GM/200ML-% IV SOLN
INTRAVENOUS | Status: AC
  Administered 2015-08-30: 1000 mg via INTRAVENOUS
  Filled 2015-08-30: qty 200

## 2015-08-30 MED ORDER — FENTANYL CITRATE (PF) 100 MCG/2ML IJ SOLN
INTRAMUSCULAR | Status: DC | PRN
  Administered 2015-08-30: 100 ug via INTRAVENOUS

## 2015-08-30 MED ORDER — FAMOTIDINE 20 MG PO TABS
ORAL_TABLET | ORAL | Status: AC
  Administered 2015-08-30: 20 mg via ORAL
  Filled 2015-08-30: qty 1

## 2015-08-30 MED ORDER — HYDROCODONE-ACETAMINOPHEN 5-325 MG PO TABS: 1.0000 | ORAL_TABLET | ORAL | Status: DC | PRN

## 2015-08-30 MED ORDER — LIDOCAINE HCL (PF) 1 % IJ SOLN
INTRAMUSCULAR | Status: AC
  Filled 2015-08-30: qty 30

## 2015-08-30 MED ORDER — PROPOFOL 500 MG/50ML IV EMUL
INTRAVENOUS | Status: DC | PRN
  Administered 2015-08-30: 100 ug/kg/min via INTRAVENOUS

## 2015-08-30 MED ORDER — ONDANSETRON HCL 4 MG/2ML IJ SOLN: 4.0000 mg | Freq: Once | INTRAMUSCULAR | Status: DC | PRN

## 2015-08-30 MED ORDER — LACTATED RINGERS IV SOLN
INTRAVENOUS | Status: DC
  Administered 2015-08-30 (×3): via INTRAVENOUS

## 2015-08-30 MED ORDER — HEPARIN SODIUM (PORCINE) 5000 UNIT/ML IJ SOLN
INTRAMUSCULAR | Status: AC
  Filled 2015-08-30: qty 1

## 2015-08-30 MED ORDER — LIDOCAINE-EPINEPHRINE 1 %-1:100000 IJ SOLN
INTRAMUSCULAR | Status: AC
  Filled 2015-08-30: qty 1

## 2015-08-30 MED ORDER — MIDAZOLAM HCL 2 MG/2ML IJ SOLN
INTRAMUSCULAR | Status: DC | PRN
  Administered 2015-08-30: 2 mg via INTRAVENOUS

## 2015-08-30 SURGICAL SUPPLY — 22 items
CANISTER SUCT 1200ML W/VALVE (MISCELLANEOUS) IMPLANT
CHLORAPREP W/TINT 26ML (MISCELLANEOUS) ×3 IMPLANT
COVER LIGHT HANDLE STERIS (MISCELLANEOUS) ×6 IMPLANT
DRAPE C-ARM XRAY 36X54 (DRAPES) ×3 IMPLANT
GLOVE BIO SURGEON STRL SZ7.5 (GLOVE) ×9 IMPLANT
GOWN STRL REUS W/ TWL LRG LVL3 (GOWN DISPOSABLE) ×2 IMPLANT
GOWN STRL REUS W/TWL LRG LVL3 (GOWN DISPOSABLE) ×4
KIT RM TURNOVER STRD PROC AR (KITS) ×3 IMPLANT
LABEL OR SOLS (LABEL) ×3 IMPLANT
LIQUID BAND (GAUZE/BANDAGES/DRESSINGS) ×3 IMPLANT
NEEDLE FILTER BLUNT 18X 1/2SAF (NEEDLE) ×2
NEEDLE FILTER BLUNT 18X1 1/2 (NEEDLE) ×1 IMPLANT
PACK PORT-A-CATH (MISCELLANEOUS) ×3 IMPLANT
PAD GROUND ADULT SPLIT (MISCELLANEOUS) ×3 IMPLANT
PORTACATH POWER 8F (Port) ×3 IMPLANT
SUT MNCRL+ 5-0 UNDYED PC-3 (SUTURE) IMPLANT
SUT MON AB 5-0 P3 18 (SUTURE) IMPLANT
SUT MONOCRYL 5-0 (SUTURE)
SUT SILK 3 0 SH 30 (SUTURE) ×3 IMPLANT
SUT VIC AB 5-0 RB1 27 (SUTURE) ×3 IMPLANT
SYR 3ML LL SCALE MARK (SYRINGE) ×3 IMPLANT
SYRINGE 10CC LL (SYRINGE) ×3 IMPLANT

## 2015-08-30 NOTE — Discharge Instructions (Signed)
Take Tylenol or Norco if needed for pain.  May shower

## 2015-08-30 NOTE — Transfer of Care (Signed)
Immediate Anesthesia Transfer of Care Note  Patient: Emma Atkinson  Procedure(s) Performed: Procedure(s): INSERTION PORT-A-CATH (N/A)  Patient Location: PACU  Anesthesia Type:General  Level of Consciousness: awake, alert  and oriented  Airway & Oxygen Therapy: Patient Spontanous Breathing and Patient connected to face mask oxygen  Post-op Assessment: Report given to RN and Post -op Vital signs reviewed and stable  Post vital signs: stable  Last Vitals:  Filed Vitals:   08/30/15 0848  BP: 138/60  Pulse: 88  Temp: 36.4 C  Resp: 16    Complications: No apparent anesthesia complications

## 2015-08-30 NOTE — Op Note (Signed)
Operative report  Preoperative diagnosis is right breast cancer  Postoperative diagnosis is right breast cancer  Operation insertion of central venous catheter with subcutaneous infusion port  Anesthesia local with monitored anesthesia care  Indications: This 49 year old female recently had findings of right breast cancer. Central venous access was recommended for chemotherapy  With the patient on the operating table in the supine position a rolled sheet was placed behind the shoulder blades to extend the neck. She was monitored by the anesthesia staff and sedated. The neck and chest wall were prepared with ChloraPrep and draped in a sterile manner.  The skin beneath the left clavicle was infiltrated with 1% Xylocaine. A transversely oriented 3 cm incision was made and carried down through subcutaneous tissues. Several small bleeding points were cauterized. A subcutaneous pouch was created large enough to admit the [place] port. The site was subsequently infiltrated with 1% Xylocaine with epinephrine for improved hemostasis. The jugular vein was identified with ultrasound. The skin overlying the jugular vein was infiltrated with 1% Xylocaine. A transversely oriented 5 mm incision was made and carried down through subcutaneous tissues. A needle was inserted into the jugular vein using ultrasound guidance. An image was saved for the paper chart. A guidewire was advanced down through the needle into the central circulation. Fluoroscopy was used to demonstrate the location of the guidewire in the vena cava. The dilator and introducer sheath were advanced over the guidewire. The guidewire and dilator were removed. The catheter was placed down through the sheath and the sheath was peeled away. Fluoroscopy was used to demonstrate the tip of the catheter in the superior vena cava. An image was saved for the paper chart. The catheter was tunneled to the subclavian incision and pressure held over the tunnel site.  The catheter was cut to fit and attached to the [place] port and accessed with a Huber needle aspirating a trace of blood and flushing with 5 cc of heparinized saline solution. The port was placed into the subcutaneous pouch and sutured to the deep fascia with 4-0 silk. Hemostasis was intact. Subcutaneous tissues were approximated with 5-0 Vicryl Both skin incisions were closed with 5-0 Vicryl subcuticular suture and LiquiBand. The patient tolerated surgery satisfactorily and was prepared for transfer to the recovery room.  Rochel Brome M.D.

## 2015-08-30 NOTE — Anesthesia Preprocedure Evaluation (Addendum)
Anesthesia Evaluation  Patient identified by MRN, date of birth, ID band Patient awake    Reviewed: Allergy & Precautions, NPO status , Patient's Chart, lab work & pertinent test results  History of Anesthesia Complications (+) PROLONGED EMERGENCE and history of anesthetic complications (as a child)  Airway Mallampati: II       Dental  (+) Teeth Intact   Pulmonary neg pulmonary ROS, former smoker,           Cardiovascular negative cardio ROS       Neuro/Psych Anxiety Depression negative neurological ROS     GI/Hepatic negative GI ROS, Neg liver ROS,   Endo/Other  negative endocrine ROS  Renal/GU negative Renal ROS     Musculoskeletal   Abdominal   Peds  Hematology  (+) anemia ,   Anesthesia Other Findings   Reproductive/Obstetrics                            Anesthesia Physical Anesthesia Plan  ASA: II  Anesthesia Plan: General   Post-op Pain Management:    Induction: Intravenous  Airway Management Planned: Nasal Cannula  Additional Equipment:   Intra-op Plan:   Post-operative Plan:   Informed Consent: I have reviewed the patients History and Physical, chart, labs and discussed the procedure including the risks, benefits and alternatives for the proposed anesthesia with the patient or authorized representative who has indicated his/her understanding and acceptance.     Plan Discussed with:   Anesthesia Plan Comments:         Anesthesia Quick Evaluation

## 2015-08-30 NOTE — Anesthesia Postprocedure Evaluation (Signed)
  Anesthesia Post-op Note  Patient: Emma Atkinson  Procedure(s) Performed: Procedure(s): INSERTION PORT-A-CATH (N/A)  Anesthesia type:General  Patient location: PACU  Post pain: Pain level controlled  Post assessment: Post-op Vital signs reviewed, Patient's Cardiovascular Status Stable, Respiratory Function Stable, Patent Airway and No signs of Nausea or vomiting  Post vital signs: Reviewed and stable  Last Vitals:  Filed Vitals:   08/30/15 1222  BP: 133/58  Pulse: 77  Temp:   Resp: 16    Level of consciousness: awake, alert  and patient cooperative  Complications: No apparent anesthesia complications

## 2015-08-31 DIAGNOSIS — G4700 Insomnia, unspecified: Secondary | ICD-10-CM | POA: Insufficient documentation

## 2015-08-31 DIAGNOSIS — F329 Major depressive disorder, single episode, unspecified: Secondary | ICD-10-CM | POA: Insufficient documentation

## 2015-08-31 DIAGNOSIS — F419 Anxiety disorder, unspecified: Secondary | ICD-10-CM | POA: Insufficient documentation

## 2015-08-31 DIAGNOSIS — F32A Depression, unspecified: Secondary | ICD-10-CM | POA: Insufficient documentation

## 2015-09-04 DIAGNOSIS — C50111 Malignant neoplasm of central portion of right female breast: Secondary | ICD-10-CM | POA: Insufficient documentation

## 2015-09-05 ENCOUNTER — Other Ambulatory Visit: Payer: Self-pay

## 2015-09-05 ENCOUNTER — Encounter: Payer: Self-pay | Admitting: Hematology and Oncology

## 2015-09-05 ENCOUNTER — Inpatient Hospital Stay (HOSPITAL_BASED_OUTPATIENT_CLINIC_OR_DEPARTMENT_OTHER): Payer: 59 | Admitting: Hematology and Oncology

## 2015-09-05 VITALS — BP 145/84 | HR 87 | Temp 96.3°F | Resp 18 | Ht 61.0 in | Wt 114.2 lb

## 2015-09-05 DIAGNOSIS — C50811 Malignant neoplasm of overlapping sites of right female breast: Secondary | ICD-10-CM

## 2015-09-05 DIAGNOSIS — D242 Benign neoplasm of left breast: Secondary | ICD-10-CM

## 2015-09-05 DIAGNOSIS — Z87891 Personal history of nicotine dependence: Secondary | ICD-10-CM

## 2015-09-05 DIAGNOSIS — Z803 Family history of malignant neoplasm of breast: Secondary | ICD-10-CM

## 2015-09-05 DIAGNOSIS — Z8041 Family history of malignant neoplasm of ovary: Secondary | ICD-10-CM

## 2015-09-05 DIAGNOSIS — F419 Anxiety disorder, unspecified: Secondary | ICD-10-CM

## 2015-09-05 DIAGNOSIS — Z170 Estrogen receptor positive status [ER+]: Secondary | ICD-10-CM | POA: Diagnosis not present

## 2015-09-05 DIAGNOSIS — Z79899 Other long term (current) drug therapy: Secondary | ICD-10-CM

## 2015-09-05 DIAGNOSIS — R232 Flushing: Secondary | ICD-10-CM

## 2015-09-05 DIAGNOSIS — Z5111 Encounter for antineoplastic chemotherapy: Secondary | ICD-10-CM | POA: Diagnosis not present

## 2015-09-05 DIAGNOSIS — Z90710 Acquired absence of both cervix and uterus: Secondary | ICD-10-CM

## 2015-09-05 DIAGNOSIS — F329 Major depressive disorder, single episode, unspecified: Secondary | ICD-10-CM

## 2015-09-05 MED ORDER — LIDOCAINE-PRILOCAINE 2.5-2.5 % EX CREA: 1.0000 "application " | TOPICAL_CREAM | CUTANEOUS | Status: DC | PRN

## 2015-09-05 NOTE — Progress Notes (Signed)
[facility] day:  09/05/2015  Chief Complaint: Emma Atkinson is a 49 y.o. female with clinical stage IIB right breast cancer s/p biopsy who is seen for review of initial work-up and finalization of treatment plan.  HPI:  The patient was last seen in the medical oncology clinic on 08/22/2015.  At that time, she was seen for initial consultation.   She had clinical stage T2N1 (stage IIB) Her2/neu + right breast cancer status post biopsy.  We discussed neoadjuvant chemotherapy consisting of either Adriamycin and Cytoxan followed by weekly Taxol with Herceptin and Perjeta versus TCH. Information on chemotherapy was provided.  She attended chemotherapy class.  We discussed My Risk of genetic testing given her family history of breast and ovarian cancer.    She underwent baseline labs.  CBC with differential, CMP, and CA27.29 were normal. Labs confirmed a premenopausal state with an [place] of 5 and an estradiol of 271.9.  PET scan on 94/80/1655 hypermetabolic right breast mass (SUV 11.3) with a hypermetabolic right axillary lymph node and a subtle focus of very faint hypermetabolic activity laterally in the right breast. There was faint hypermetabolic activity at the biopsy site of the left breast (? biopsy related).  Echocardiogram on 08/26/2015 revealed an ejection fraction of 55-60%.  She underwent Port-A-Cath placement on 08/30/2015.  She saw Dr Baruch Gouty from radiation oncology on 08/25/2015.  During the interim, she has done well.  Past Medical History  Diagnosis Date  . Breast cancer ([place])   . Anxiety   . Depression   . Anemia     H/O  . Complication of anesthesia     HARD TO WAKE UP AFTER TONSILLECTOMY AS A CHILD BUT NO PROBLEMS SINCE    Past Surgical History  Procedure Laterality Date  . Breast biopsy Bilateral     08/17/2015   . Abdominal hysterectomy  2001  . Tonsillectomy    . Back surgery      FOR SCOLIOSIS-HERRINGTON RODS IN PLACE  .  Portacath placement N/A 08/30/2015    Procedure: INSERTION PORT-A-CATH;  Surgeon: Leonie Green, MD;  Location: [facility] ORS;  Service: General;  Laterality: N/A;    Family History  Problem Relation Age of Onset  . Breast cancer Maternal Aunt 64  . Cancer Father   . Cancer Sister   . Cancer Maternal Grandfather     Social History:  reports that she quit smoking about 15 years ago. Her smoking use included Cigarettes. She has a 10 pack-year smoking history. She does not have any smokeless tobacco history on file. She reports that she drinks alcohol. She reports that she does not use illicit drugs.  The patient is accompanied by her husband and mother today.  Allergies:  Allergies  Allergen Reactions  . Ceftin [Cefuroxime Axetil] Rash    Current Medications: Current Outpatient Prescriptions  Medication Sig Dispense Refill  . ALPRAZolam (XANAX) 0.25 MG tablet Take 1/4 tablet up to three times daily as needed    . diphenhydrAMINE (SOMINEX) 25 MG tablet Take 25 mg by mouth at bedtime.     . Multiple Vitamin (MULTIVITAMIN) capsule Take 1 capsule by mouth daily.    Marland Kitchen dexamethasone (DECADRON) 4 MG tablet Take 2 tablets by mouth once a day on the day after chemotherapy and then take 2 tablets two times a day for 2 days. Take with food. 30 tablet 1  . lidocaine-prilocaine (EMLA) cream Apply 1 application topically as needed. Apply at least  1-2 hours before the port is to be used. 30 g 1  . LORazepam (ATIVAN) 0.5 MG tablet Take 1 tablet (0.5 mg total) by mouth every 6 (six) hours as needed (Nausea or vomiting). 30 tablet 0  . ondansetron (ZOFRAN) 8 MG tablet Take 1 tablet (8 mg total) by mouth 2 (two) times daily as needed. Start on the third day after chemotherapy. 30 tablet 1   No current facility-administered medications for this visit.    Review of Systems:  GENERAL:  Feels good.  Active.  No fevers, sweats or weight loss. PERFORMANCE STATUS (ECOG):  0 HEENT:  No visual changes, runny  nose, sore throat, mouth sores or tenderness. Lungs: No shortness of breath or cough.  No hemoptysis. Cardiac:  No chest pain, palpitations, orthopnea, or PND. GI:  No nausea, vomiting, diarrhea, constipation, melena or hematochezia. GU:  No urgency, frequency, dysuria, or hematuria. Musculoskeletal:  No back pain.  No joint pain.  No muscle tenderness. Extremities:  No pain or swelling. Skin:  No rashes or skin changes. Neuro:  Sinus headaches.  No numbness or weakness, balance or coordination issues. Endocrine:  No diabetes, thyroid issues, hot flashes or night sweats. Psych:  Nervous/anxious.  No mood changes or depression. Pain:  No focal pain. Review of systems:  All other systems reviewed and found to be negative.  Physical Exam: Blood pressure 145/84, pulse 87, temperature 96.3 F (35.7 C), temperature source Tympanic, resp. rate 18, height _0  (1.549 m), weight 114 lb 3.2 oz (51.8 kg). GENERAL:  Well developed, well nourished, sitting comfortably in the exam room in no acute distress. MENTAL STATUS:  Alert and oriented to person, place and time. HEAD:  Blonde frosted hair.  Normocephalic, atraumatic, face symmetric, no Cushingoid features. EYES:  Blue eyes.  No conjunctivitis or scleral icterus. NEUROLOGICAL: Unremarkable. PSYCH:  Appropriate.  No visits with results within 3 Day(s) from this visit. Latest known visit with results is:  Hospital Outpatient Visit on 08/26/2015  Component Date Value Ref Range Status  . Glucose-Capillary 08/26/2015 70  65 - 99 mg/dL Final    Assessment:  Emma Atkinson is a 49 y.o. female with clinical stage T2N1 (stage IIB) Her2/neu + right breast cancer status post biopsy.  She presented with a minimally painful right breast mass with nipple inversion.  Bilateral mammogram and ultrasound on 08/12/2015 revealed a 4.1 cm right superior breast mass containing pleomorphic microcalcifications. There were several satellite lesions (1.3 cm, 0.9 cm,  0.6 cm) plus a 1.3 cm mass versus complicated cyst and a 0.8 cm cyst.  There was one 5 mm right axillary node with irregular thickened cortex.   Left breast revealed a 1.5 cm cyst containing intramural nodule versus debris at the 9:00 position 2 cm from the nipple. There was also an indeterminate left breast 7 mm nodule at the 2:00 position 4 cm from the nipple.  Left breast biopsy at the 9:00 position on 08/17/2015 revealed intraductal papilloma with fibrocystic changes.   Core needle biopsy on 08/17/2015 of the right breast mass revealed a grade 3 invasive mammary carcinoma. Right axillary biopsy confirmed metastatic mammary carcinoma. Tumor was ER positive (1-10%), PR positive (11-50%) and HER-2/neu 3+  PET scan on 08/26/2015 revealed hypermetabolic right breast mass (SUV 11.3) with a hypermetabolic right axillary lymph node and a subtle focus of very faint hypermetabolic activity laterally in the right breast. There was faint hypermetabolic activity at the biopsy site of the left breast (? biopsy related).  CA27.29 was 19.6 on 08/22/2015.  She is s/p partial hysterectomy.  She is premenopausal ([place] 5 and estradiol 271.9) on 08/22/2015.    Echocardiogram on 08/26/2015 revealed an ejection fraction of 55-60%.  She has a family history of breast and ovarian cancer.  Symptomatically, she feels well.  Exam reveals a 5 cm right breast mass and significant changes in the left breast.  Plan: 1.  Review labs, PET scan, and echocardiogram. 2.  Discuss chemotherapy in detail.  Decision to proceed with AC q 2-3 weeks x 4 cycles with Neulasta support followed by weekly Taxol x 12 plus Perjeta x 4 cycles every 3 weeks with Herceptin x 1 year).  Discuss exams and follow-up breast ultrasound during chemotherapy to assess tumor response.  Discuss surgery approximately 3-4 weeks after chemotherapy complete.  Discuss radiation therapy consult.  Discuss hormonal therapy. 3.  Preauth Adriamycin and Cytoxan x 4  cycles. 4.  My Risk genetic testing. 5.  Rx:  wig (cranial prosthesis). 6.  RTC on 09/13/2015 for MD assess, labs (CBC with diff, CMP, Mg), and cycle #1 AC with Neulasta support 7.  RTC on 09/20/2015 for nadir assessment (CBC with diff).   Lequita Asal, MD  09/05/2015

## 2015-09-06 ENCOUNTER — Telehealth: Payer: Self-pay | Admitting: *Deleted

## 2015-09-06 NOTE — Telephone Encounter (Signed)
  Oncology Nurse Navigator Documentation  Referral date to RadOnc/MedOnc: 09/06/15 (09/06/15 1000) Navigator Encounter Type: Telephone (09/06/15 1000) Patient Visit Type: Follow-up (09/06/15 1000) Treatment Phase: Other (09/06/15 1000) Barriers/Navigation Needs: Education (09/06/15 1000)    Called patient today to see if she had any questions or needs.  Patient met with Dr. Alvia Grove yesterday for treatment plan.  She is to start chemo on 09/13/15.  No needs at this time.            Time Spent with Patient: 15 (09/06/15 1000)

## 2015-09-13 ENCOUNTER — Inpatient Hospital Stay (HOSPITAL_BASED_OUTPATIENT_CLINIC_OR_DEPARTMENT_OTHER): Payer: 59 | Admitting: Internal Medicine

## 2015-09-13 ENCOUNTER — Telehealth: Payer: Self-pay | Admitting: *Deleted

## 2015-09-13 ENCOUNTER — Inpatient Hospital Stay: Payer: 59

## 2015-09-13 ENCOUNTER — Encounter: Payer: Self-pay | Admitting: *Deleted

## 2015-09-13 ENCOUNTER — Telehealth: Payer: Self-pay

## 2015-09-13 ENCOUNTER — Encounter: Payer: Self-pay | Admitting: Hematology and Oncology

## 2015-09-13 ENCOUNTER — Encounter: Payer: Self-pay | Admitting: Internal Medicine

## 2015-09-13 ENCOUNTER — Other Ambulatory Visit: Payer: Self-pay | Admitting: Hematology and Oncology

## 2015-09-13 VITALS — BP 152/84 | HR 88 | Temp 98.0°F | Resp 18 | Ht 61.0 in | Wt 112.0 lb

## 2015-09-13 DIAGNOSIS — Z170 Estrogen receptor positive status [ER+]: Secondary | ICD-10-CM | POA: Diagnosis not present

## 2015-09-13 DIAGNOSIS — Z803 Family history of malignant neoplasm of breast: Secondary | ICD-10-CM

## 2015-09-13 DIAGNOSIS — C50811 Malignant neoplasm of overlapping sites of right female breast: Secondary | ICD-10-CM

## 2015-09-13 DIAGNOSIS — C50111 Malignant neoplasm of central portion of right female breast: Secondary | ICD-10-CM

## 2015-09-13 DIAGNOSIS — Z8041 Family history of malignant neoplasm of ovary: Secondary | ICD-10-CM

## 2015-09-13 DIAGNOSIS — Z87891 Personal history of nicotine dependence: Secondary | ICD-10-CM

## 2015-09-13 DIAGNOSIS — D242 Benign neoplasm of left breast: Secondary | ICD-10-CM

## 2015-09-13 DIAGNOSIS — Z90710 Acquired absence of both cervix and uterus: Secondary | ICD-10-CM

## 2015-09-13 DIAGNOSIS — N390 Urinary tract infection, site not specified: Secondary | ICD-10-CM

## 2015-09-13 DIAGNOSIS — Z5111 Encounter for antineoplastic chemotherapy: Secondary | ICD-10-CM | POA: Diagnosis not present

## 2015-09-13 DIAGNOSIS — F329 Major depressive disorder, single episode, unspecified: Secondary | ICD-10-CM

## 2015-09-13 DIAGNOSIS — F419 Anxiety disorder, unspecified: Secondary | ICD-10-CM

## 2015-09-13 DIAGNOSIS — R232 Flushing: Secondary | ICD-10-CM

## 2015-09-13 DIAGNOSIS — Z79899 Other long term (current) drug therapy: Secondary | ICD-10-CM

## 2015-09-13 LAB — COMPREHENSIVE METABOLIC PANEL
ALT: 18 U/L (ref 14–54)
AST: 22 U/L (ref 15–41)
Albumin: 4.3 g/dL (ref 3.5–5.0)
Alkaline Phosphatase: 54 U/L (ref 38–126)
Anion gap: 8 (ref 5–15)
BUN: 13 mg/dL (ref 6–20)
CO2: 22 mmol/L (ref 22–32)
Calcium: 9.2 mg/dL (ref 8.9–10.3)
Chloride: 107 mmol/L (ref 101–111)
Creatinine, Ser: 0.8 mg/dL (ref 0.44–1.00)
GFR calc Af Amer: >60 mL/min (ref >60)
GFR calc non Af Amer: >60 mL/min (ref >60)
Glucose, Bld: 79 mg/dL (ref 65–99)
Potassium: 3.7 mmol/L (ref 3.5–5.1)
Sodium: 137 mmol/L (ref 135–145)
Total Bilirubin: 0.6 mg/dL (ref 0.3–1.2)
Total Protein: 7 g/dL (ref 6.5–8.1)

## 2015-09-13 LAB — CBC WITH DIFFERENTIAL/PLATELET
Basophils Absolute: 0 10*3/uL (ref 0–0.1)
Basophils Relative: 0 %
Eosinophils Absolute: 0.2 10*3/uL (ref 0–0.7)
Eosinophils Relative: 2 %
HCT: 39.5 % (ref 35.0–47.0)
Hemoglobin: 13.1 g/dL (ref 12.0–16.0)
Lymphocytes Relative: 22 %
Lymphs Abs: 1.7 10*3/uL (ref 1.0–3.6)
MCH: 30.8 pg (ref 26.0–34.0)
MCHC: 33.1 g/dL (ref 32.0–36.0)
MCV: 93.1 fL (ref 80.0–100.0)
Monocytes Absolute: 0.5 10*3/uL (ref 0.2–0.9)
Monocytes Relative: 7 %
Neutro Abs: 5.5 10*3/uL (ref 1.4–6.5)
Neutrophils Relative %: 69 %
Platelets: 328 10*3/uL (ref 150–440)
RBC: 4.24 MIL/uL (ref 3.80–5.20)
RDW: 13.2 % (ref 11.5–14.5)
WBC: 8 10*3/uL (ref 3.6–11.0)

## 2015-09-13 LAB — MAGNESIUM: Magnesium: 1.9 mg/dL (ref 1.7–2.4)

## 2015-09-13 MED ORDER — SODIUM CHLORIDE 0.9 % IV SOLN
Freq: Once | INTRAVENOUS | Status: AC
  Administered 2015-09-13: 11:00:00 via INTRAVENOUS
  Filled 2015-09-13: qty 1000

## 2015-09-13 MED ORDER — DEXAMETHASONE 4 MG PO TABS: ORAL_TABLET | ORAL | Status: DC

## 2015-09-13 MED ORDER — ONDANSETRON HCL 8 MG PO TABS: 8.0000 mg | ORAL_TABLET | Freq: Two times a day (BID) | ORAL | Status: DC | PRN

## 2015-09-13 MED ORDER — DOXORUBICIN HCL CHEMO IV INJECTION 2 MG/ML
60.0000 mg/m2 | Freq: Once | INTRAVENOUS | Status: AC
  Administered 2015-09-13: 88 mg via INTRAVENOUS
  Filled 2015-09-13: qty 44

## 2015-09-13 MED ORDER — SODIUM CHLORIDE 0.9 % IJ SOLN
10.0000 mL | INTRAMUSCULAR | Status: DC | PRN
  Administered 2015-09-13: 10 mL via INTRAVENOUS
  Filled 2015-09-13: qty 10

## 2015-09-13 MED ORDER — HEPARIN SOD (PORK) LOCK FLUSH 100 UNIT/ML IV SOLN
500.0000 [IU] | Freq: Once | INTRAVENOUS | Status: DC
  Filled 2015-09-13: qty 5

## 2015-09-13 MED ORDER — LORAZEPAM 0.5 MG PO TABS: 0.5000 mg | ORAL_TABLET | Freq: Four times a day (QID) | ORAL | Status: DC | PRN

## 2015-09-13 MED ORDER — FAMOTIDINE IN NACL 20-0.9 MG/50ML-% IV SOLN
20.0000 mg | Freq: Once | INTRAVENOUS | Status: AC
  Administered 2015-09-13: 20 mg via INTRAVENOUS
  Filled 2015-09-13: qty 50

## 2015-09-13 MED ORDER — HEPARIN SOD (PORK) LOCK FLUSH 100 UNIT/ML IV SOLN
500.0000 [IU] | Freq: Once | INTRAVENOUS | Status: AC | PRN
  Administered 2015-09-13: 500 [IU]

## 2015-09-13 MED ORDER — SODIUM CHLORIDE 0.9 % IV SOLN
600.0000 mg/m2 | Freq: Once | INTRAVENOUS | Status: AC
  Administered 2015-09-13: 880 mg via INTRAVENOUS
  Filled 2015-09-13: qty 44

## 2015-09-13 MED ORDER — DIPHENHYDRAMINE HCL 50 MG/ML IJ SOLN
25.0000 mg | Freq: Once | INTRAMUSCULAR | Status: AC
  Administered 2015-09-13: 25 mg via INTRAVENOUS
  Filled 2015-09-13: qty 1

## 2015-09-13 MED ORDER — PALONOSETRON HCL INJECTION 0.25 MG/5ML
0.2500 mg | Freq: Once | INTRAVENOUS | Status: AC
  Administered 2015-09-13: 0.25 mg via INTRAVENOUS
  Filled 2015-09-13: qty 5

## 2015-09-13 MED ORDER — SODIUM CHLORIDE 0.9 % IV SOLN
Freq: Once | INTRAVENOUS | Status: AC
  Administered 2015-09-13: 11:00:00 via INTRAVENOUS
  Filled 2015-09-13: qty 5

## 2015-09-13 NOTE — Telephone Encounter (Signed)
Pt has reaction to the decadron and emend together in infusion bag. Per Katharine Look it happened almost as soon as the med started.  Katharine Look states she had flushing of her face and her whole face was beet red. She was given additional medication of benadryl and pepcid.  I called and spoke to Bethany about the rx for decadron that the pharmacy has to order for her to make she still wants to use it with pt having reaction. Mike Gip states no she does not want her to have it. She can use ativan and it was explained to her and the zofran.  Called pt to tell her the above and i did call pharmacy and leave message to cancel the dexamethasone rx .  Pt states she got a call from [facility] today about the ativan but pt never got rx for it.  I told her that I would check with md in am and order it for her and he understands that she will not use the dexamethasone and that I have cancelled it from the pharmacy.  The pharmacy had to order it and so it will no longer need to be ordered.

## 2015-09-13 NOTE — Progress Notes (Signed)
Patient is here for follow-up of breast cancer and first [facility] treatment. Patient states that last week she had a UTI and was placed on Cipro x 7days.

## 2015-09-13 NOTE — Progress Notes (Signed)
  Oncology Nurse Navigator Documentation    Navigator Encounter Type: Treatment (09/13/15 1100) Patient Visit Type: Follow-up (09/13/15 1100) Treatment Phase: First Chemo Tx (09/13/15 1100) Barriers/Navigation Needs: No barriers at this time (09/13/15 1100)      Met patient and her husband during her first chemotherapy treatment.  She was a little anxious.  Offered support.  She is to call if she has any questions or needs.          Time Spent with Patient: 15 (09/13/15 1100)

## 2015-09-13 NOTE — Telephone Encounter (Signed)
Called per MD and spoke with pt's husband while pt was in treatment.  Per MD pt has a prescription for xanax and one for Ativan for nausea.  Per MD informed pt's husband that if she takes Ativan for nausea not to take Xanax.  Husband verbalized an understanding and was sitting right beside pt.

## 2015-09-13 NOTE — Progress Notes (Signed)
[facility] day:  09/05/2015  Chief Complaint: Emma Atkinson is a 49 y.o. female with clinical stage IIB right breast cancer s/p biopsy who is seen for review of initial work-up and finalization of treatment plan.  HPI:  The patient was last seen in the medical oncology clinic on 08/22/2015.  At that time, she was seen for initial consultation.   She had clinical stage T2N1 (stage IIB) Her2/neu + right breast cancer status post biopsy.  We discussed neoadjuvant chemotherapy consisting of either Adriamycin and Cytoxan followed by weekly Taxol with Herceptin and Perjeta versus TCH. Information on chemotherapy was provided.  She attended chemotherapy class.  We discussed My Risk of genetic testing given her family history of breast and ovarian cancer.    She underwent baseline labs.  CBC with differential, CMP, and CA27.29 were normal. Labs confirmed a premenopausal state with an H[place] of 5 and an estradiol of 271.9.  PET scan on 11/65/7903 hypermetabolic right breast mass (SUV 11.3) with a hypermetabolic right axillary lymph node and a subtle focus of very faint hypermetabolic activity laterally in the right breast. There was faint hypermetabolic activity at the biopsy site of the left breast (? biopsy related).  Echocardiogram on 08/26/2015 revealed an ejection fraction of 55-60%.  She underwent Port-A-Cath placement on 08/30/2015.  She saw Dr Baruch Gouty from radiation oncology on 08/25/2015.  Current status:  Emma Atkinson comes to our clinic to start cycle 1 of dose dense AC chemotherapy. Last week she developed burning with urination, went to a walk-in clinic, where she was diagnosed with UTI, and prescribed ciprofloxacin, although culture of the urine came back negative. She has improved on ciprofloxacin within the next 2 days, and for the past 4 days she has been symptomatic. She denies pain with urination, burning with urination, blood in the urine, back  pain, fever, chills.  Past Medical History  Diagnosis Date  . Breast cancer ([place])   . Anxiety   . Depression   . Anemia     H/O  . Complication of anesthesia     HARD TO WAKE UP AFTER TONSILLECTOMY AS A CHILD BUT NO PROBLEMS SINCE    Past Surgical History  Procedure Laterality Date  . Breast biopsy Bilateral     08/17/2015   . Abdominal hysterectomy  2001  . Tonsillectomy    . Back surgery      FOR SCOLIOSIS-HERRINGTON RODS IN PLACE  . Portacath placement N/A 08/30/2015    Procedure: INSERTION PORT-A-CATH;  Surgeon: Leonie Green, MD;  Location: [facility] ORS;  Service: General;  Laterality: N/A;    Family History  Problem Relation Age of Onset  . Breast cancer Maternal Aunt 64  . Cancer Father   . Cancer Sister   . Cancer Maternal Grandfather     Social History:  reports that she quit smoking about 15 years ago. Her smoking use included Cigarettes. She has a 10 pack-year smoking history. She does not have any smokeless tobacco history on file. She reports that she drinks alcohol. She reports that she does not use illicit drugs.  The patient is accompanied by her husband and mother today.  Allergies:  Allergies  Allergen Reactions  . Ceftin [Cefuroxime Axetil] Rash    Current Medications: Current Outpatient Prescriptions  Medication Sig Dispense Refill  . ALPRAZolam (XANAX) 0.25 MG tablet Take 1/4 tablet up to three times daily as needed    . dexamethasone (DECADRON) 4 MG tablet  Take 2 tablets by mouth once a day on the day after chemotherapy and then take 2 tablets two times a day for 2 days. Take with food. 30 tablet 1  . diphenhydrAMINE (SOMINEX) 25 MG tablet Take 25 mg by mouth at bedtime.     . lidocaine-prilocaine (EMLA) cream Apply 1 application topically as needed. Apply at least 1-2 hours before the port is to be used. 30 g 1  . LORazepam (ATIVAN) 0.5 MG tablet Take 1 tablet (0.5 mg total) by mouth every 6 (six) hours as needed (Nausea or vomiting). 30 tablet  0  . Multiple Vitamin (MULTIVITAMIN) capsule Take 1 capsule by mouth daily.    . ondansetron (ZOFRAN) 8 MG tablet Take 1 tablet (8 mg total) by mouth 2 (two) times daily as needed. Start on the third day after chemotherapy. 30 tablet 1   No current facility-administered medications for this visit.    Review of Systems:  GENERAL:  Feels good.  Active.  No fevers, sweats or weight loss. PERFORMANCE STATUS (ECOG):  0 HEENT:  No visual changes, runny nose, sore throat, mouth sores or tenderness. Lungs: No shortness of breath or cough.  No hemoptysis. Cardiac:  No chest pain, palpitations, orthopnea, or PND. GI:  No nausea, vomiting, diarrhea, constipation, melena or hematochezia. GU:  No urgency, frequency, dysuria, or hematuria. Musculoskeletal:  No back pain.  No joint pain.  No muscle tenderness. Extremities:  No pain or swelling. Skin:  No rashes or skin changes. Neuro:  Sinus headaches.  No numbness or weakness, balance or coordination issues. Endocrine:  No diabetes, thyroid issues, hot flashes or night sweats. Psych:  Nervous/anxious.  No mood changes or depression. Pain:  No focal pain. Review of systems:  All other systems reviewed and found to be negative.  Physical Exam: Blood pressure 152/84, pulse 88, temperature 98 F (36.7 C), temperature source Tympanic, resp. rate 18, height 5' 1" (1.549 m), weight 111 lb 15.9 oz (50.8 kg). GENERAL:  Well developed, well nourished, sitting comfortably in the exam room in no acute distress. MENTAL STATUS:  Alert and oriented to person, place and time. HEAD:  Blonde frosted hair.  Normocephalic, atraumatic, face symmetric, no Cushingoid features. EYES:  Blue eyes.  No conjunctivitis or scleral icterus. NEUROLOGICAL: Unremarkable. PSYCH:  Appropriate. Breast: 5 cm palpable mass in the right breast. No palpable lymphadenopathy.  Infusion on 09/13/2015  Component Date Value Ref Range Status  . WBC 09/13/2015 8.0  3.6 - 11.0 K/uL Final  .  RBC 09/13/2015 4.24  3.80 - 5.20 MIL/uL Final  . Hemoglobin 09/13/2015 13.1  12.0 - 16.0 g/dL Final  . HCT 09/13/2015 39.5  35.0 - 47.0 % Final  . MCV 09/13/2015 93.1  80.0 - 100.0 fL Final  . MCH 09/13/2015 30.8  26.0 - 34.0 pg Final  . MCHC 09/13/2015 33.1  32.0 - 36.0 g/dL Final  . RDW 09/13/2015 13.2  11.5 - 14.5 % Final  . Platelets 09/13/2015 328  150 - 440 K/uL Final  . Neutrophils Relative % 09/13/2015 69   Final  . Neutro Abs 09/13/2015 5.5  1.4 - 6.5 K/uL Final  . Lymphocytes Relative 09/13/2015 22   Final  . Lymphs Abs 09/13/2015 1.7  1.0 - 3.6 K/uL Final  . Monocytes Relative 09/13/2015 7   Final  . Monocytes Absolute 09/13/2015 0.5  0.2 - 0.9 K/uL Final  . Eosinophils Relative 09/13/2015 2   Final  . Eosinophils Absolute 09/13/2015 0.2  0 - 0.7  K/uL Final  . Basophils Relative 09/13/2015 0   Final  . Basophils Absolute 09/13/2015 0.0  0 - 0.1 K/uL Final  . Sodium 09/13/2015 137  135 - 145 mmol/L Final  . Potassium 09/13/2015 3.7  3.5 - 5.1 mmol/L Final  . Chloride 09/13/2015 107  101 - 111 mmol/L Final  . CO2 09/13/2015 22  22 - 32 mmol/L Final  . Glucose, Bld 09/13/2015 79  65 - 99 mg/dL Final  . BUN 09/13/2015 13  6 - 20 mg/dL Final  . Creatinine, Ser 09/13/2015 0.80  0.44 - 1.00 mg/dL Final  . Calcium 09/13/2015 9.2  8.9 - 10.3 mg/dL Final  . Total Protein 09/13/2015 7.0  6.5 - 8.1 g/dL Final  . Albumin 09/13/2015 4.3  3.5 - 5.0 g/dL Final  . AST 09/13/2015 22  15 - 41 U/L Final  . ALT 09/13/2015 18  14 - 54 U/L Final  . Alkaline Phosphatase 09/13/2015 54  38 - 126 U/L Final  . Total Bilirubin 09/13/2015 0.6  0.3 - 1.2 mg/dL Final  . GFR calc non Af Amer 09/13/2015 >60  >60 mL/min Final  . GFR calc Af Amer 09/13/2015 >60  >60 mL/min Final   Comment: (NOTE) The eGFR has been calculated using the CKD EPI equation. This calculation has not been validated in all clinical situations. eGFR's persistently <60 mL/min signify possible Chronic Kidney Disease.   . Anion  gap 09/13/2015 8  5 - 15 Final  . Magnesium 09/13/2015 1.9  1.7 - 2.4 mg/dL Final    Assessment:  Emma Atkinson is a 49 y.o. female with clinical stage T2N1 (stage IIB) Her2/neu + right breast cancer status post biopsy.  She presented with a minimally painful right breast mass with nipple inversion.  Bilateral mammogram and ultrasound on 08/12/2015 revealed a 4.1 cm right superior breast mass containing pleomorphic microcalcifications. There were several satellite lesions (1.3 cm, 0.9 cm, 0.6 cm) plus a 1.3 cm mass versus complicated cyst and a 0.8 cm cyst.  There was one 5 mm right axillary node with irregular thickened cortex.   Left breast revealed a 1.5 cm cyst containing intramural nodule versus debris at the 9:00 position 2 cm from the nipple. There was also an indeterminate left breast 7 mm nodule at the 2:00 position 4 cm from the nipple.  Left breast biopsy at the 9:00 position on 08/17/2015 revealed intraductal papilloma with fibrocystic changes.   Core needle biopsy on 08/17/2015 of the right breast mass revealed a grade 3 invasive mammary carcinoma. Right axillary biopsy confirmed metastatic mammary carcinoma. Tumor was ER positive (1-10%), PR positive (11-50%) and HER-2/neu 3+  PET scan on 08/26/2015 revealed hypermetabolic right breast mass (SUV 11.3) with a hypermetabolic right axillary lymph node and a subtle focus of very faint hypermetabolic activity laterally in the right breast. There was faint hypermetabolic activity at the biopsy site of the left breast (? biopsy related).  CA27.29 was 19.6 on 08/22/2015.  She is s/p partial hysterectomy.  She is premenopausal ([place] 5 and estradiol 271.9) on 08/22/2015.    Echocardiogram on 08/26/2015 revealed an ejection fraction of 55-60%.  She has a family history of breast and ovarian cancer.  Symptomatically, she feels well.  Exam reveals a 5 cm right breast mass and no significant changes in the left breast.  Plan: 1. Patient is ready  to proceed with the first cycle dose dense AC chemotherapy. Discussed the need for oral hydration, antinausea medications to be taken at  home, and the need to call the clinic if she were to develop intractable nausea and vomiting, fevers or any other problems. 2. Neulasta to be injected tomorrow in [place] clinic. 3. Return to the clinic in one week for CBC, CMP check. 4.  RTC in 2 weeks for MD assessment, labs (CBC with diff, CMP, Mg), and cycle #2 AC with Neulasta support 5. Recent UTI-will complete ciprofloxacin tomorrow. Currently symptomatic. Will call us if symptoms recur.   Roxana Hires, MD  09/05/2015

## 2015-09-14 ENCOUNTER — Telehealth: Payer: Self-pay | Admitting: *Deleted

## 2015-09-14 ENCOUNTER — Ambulatory Visit: Payer: 59

## 2015-09-14 ENCOUNTER — Inpatient Hospital Stay: Payer: 59

## 2015-09-14 VITALS — BP 136/62 | HR 83 | Temp 98.1°F | Resp 18

## 2015-09-14 DIAGNOSIS — C50111 Malignant neoplasm of central portion of right female breast: Secondary | ICD-10-CM

## 2015-09-14 DIAGNOSIS — Z5111 Encounter for antineoplastic chemotherapy: Secondary | ICD-10-CM | POA: Diagnosis not present

## 2015-09-14 MED ORDER — PEGFILGRASTIM INJECTION 6 MG/0.6ML ~~LOC~~
6.0000 mg | PREFILLED_SYRINGE | Freq: Once | SUBCUTANEOUS | Status: AC
Start: 2015-09-14 — End: 2015-09-14
  Administered 2015-09-14: 6 mg via SUBCUTANEOUS
  Filled 2015-09-14: qty 0.6

## 2015-09-14 NOTE — Telephone Encounter (Signed)
Called in the rx that Dr. Mike Gip sent electronically but Ativan does not go through electronically and it was called into pharmacy as directed in prescription ordered by [facility].  Pt was told yest evening that I would call it in this am and she will look for it to be ready today.

## 2015-09-19 ENCOUNTER — Inpatient Hospital Stay: Payer: 59 | Attending: Hematology and Oncology | Admitting: Hematology and Oncology

## 2015-09-19 ENCOUNTER — Inpatient Hospital Stay: Payer: 59

## 2015-09-19 ENCOUNTER — Telehealth: Payer: Self-pay | Admitting: *Deleted

## 2015-09-19 ENCOUNTER — Other Ambulatory Visit: Payer: Self-pay

## 2015-09-19 VITALS — BP 115/81 | HR 75 | Temp 95.9°F | Resp 18 | Ht 61.0 in | Wt 108.0 lb

## 2015-09-19 DIAGNOSIS — R7989 Other specified abnormal findings of blood chemistry: Secondary | ICD-10-CM

## 2015-09-19 DIAGNOSIS — R42 Dizziness and giddiness: Secondary | ICD-10-CM | POA: Diagnosis not present

## 2015-09-19 DIAGNOSIS — R634 Abnormal weight loss: Secondary | ICD-10-CM | POA: Diagnosis not present

## 2015-09-19 DIAGNOSIS — Z170 Estrogen receptor positive status [ER+]: Secondary | ICD-10-CM | POA: Insufficient documentation

## 2015-09-19 DIAGNOSIS — Z87891 Personal history of nicotine dependence: Secondary | ICD-10-CM | POA: Insufficient documentation

## 2015-09-19 DIAGNOSIS — C50111 Malignant neoplasm of central portion of right female breast: Secondary | ICD-10-CM

## 2015-09-19 DIAGNOSIS — D709 Neutropenia, unspecified: Secondary | ICD-10-CM | POA: Diagnosis not present

## 2015-09-19 DIAGNOSIS — Z5111 Encounter for antineoplastic chemotherapy: Secondary | ICD-10-CM | POA: Insufficient documentation

## 2015-09-19 DIAGNOSIS — Z862 Personal history of diseases of the blood and blood-forming organs and certain disorders involving the immune mechanism: Secondary | ICD-10-CM | POA: Diagnosis not present

## 2015-09-19 DIAGNOSIS — R531 Weakness: Secondary | ICD-10-CM | POA: Insufficient documentation

## 2015-09-19 DIAGNOSIS — R197 Diarrhea, unspecified: Secondary | ICD-10-CM | POA: Insufficient documentation

## 2015-09-19 DIAGNOSIS — Z8041 Family history of malignant neoplasm of ovary: Secondary | ICD-10-CM | POA: Insufficient documentation

## 2015-09-19 DIAGNOSIS — Z7689 Persons encountering health services in other specified circumstances: Secondary | ICD-10-CM | POA: Diagnosis not present

## 2015-09-19 DIAGNOSIS — F419 Anxiety disorder, unspecified: Secondary | ICD-10-CM | POA: Diagnosis not present

## 2015-09-19 DIAGNOSIS — F329 Major depressive disorder, single episode, unspecified: Secondary | ICD-10-CM | POA: Insufficient documentation

## 2015-09-19 DIAGNOSIS — C50811 Malignant neoplasm of overlapping sites of right female breast: Secondary | ICD-10-CM | POA: Diagnosis not present

## 2015-09-19 DIAGNOSIS — E2740 Unspecified adrenocortical insufficiency: Secondary | ICD-10-CM

## 2015-09-19 DIAGNOSIS — Z79899 Other long term (current) drug therapy: Secondary | ICD-10-CM | POA: Insufficient documentation

## 2015-09-19 DIAGNOSIS — Z803 Family history of malignant neoplasm of breast: Secondary | ICD-10-CM | POA: Diagnosis not present

## 2015-09-19 DIAGNOSIS — Z809 Family history of malignant neoplasm, unspecified: Secondary | ICD-10-CM | POA: Insufficient documentation

## 2015-09-19 DIAGNOSIS — D702 Other drug-induced agranulocytosis: Secondary | ICD-10-CM

## 2015-09-19 LAB — BASIC METABOLIC PANEL
Anion gap: 8 (ref 5–15)
BUN: 11 mg/dL (ref 6–20)
CO2: 26 mmol/L (ref 22–32)
Calcium: 9.4 mg/dL (ref 8.9–10.3)
Chloride: 101 mmol/L (ref 101–111)
Creatinine, Ser: 0.57 mg/dL (ref 0.44–1.00)
GFR calc Af Amer: >60 mL/min (ref >60)
GFR calc non Af Amer: >60 mL/min (ref >60)
Glucose, Bld: 91 mg/dL (ref 65–99)
Potassium: 3.7 mmol/L (ref 3.5–5.1)
Sodium: 135 mmol/L (ref 135–145)

## 2015-09-19 LAB — CBC WITH DIFFERENTIAL/PLATELET
Basophils Absolute: 0 10*3/uL (ref 0–0.1)
Basophils Relative: 1 %
Eosinophils Absolute: 0.1 10*3/uL (ref 0–0.7)
Eosinophils Relative: 7 %
HCT: 38.8 % (ref 35.0–47.0)
Hemoglobin: 12.7 g/dL (ref 12.0–16.0)
Lymphocytes Relative: 67 %
Lymphs Abs: 0.7 10*3/uL — ABNORMAL LOW (ref 1.0–3.6)
MCH: 30.9 pg (ref 26.0–34.0)
MCHC: 32.8 g/dL (ref 32.0–36.0)
MCV: 94 fL (ref 80.0–100.0)
Monocytes Absolute: 0 10*3/uL — ABNORMAL LOW (ref 0.2–0.9)
Monocytes Relative: 4 %
Neutro Abs: 0.2 10*3/uL — ABNORMAL LOW (ref 1.4–6.5)
Neutrophils Relative %: 21 %
Platelets: 192 10*3/uL (ref 150–440)
RBC: 4.13 MIL/uL (ref 3.80–5.20)
RDW: 12.8 % (ref 11.5–14.5)
WBC: 1 10*3/uL — CL (ref 3.6–11.0)

## 2015-09-19 LAB — CORTISOL: Cortisol, Plasma: 6.4 ug/dL

## 2015-09-19 MED ORDER — LORAZEPAM 0.5 MG PO TABS: 0.5000 mg | ORAL_TABLET | Freq: Four times a day (QID) | ORAL | Status: DC | PRN

## 2015-09-19 NOTE — Progress Notes (Signed)
Patient is acute add on for dizziness. She had first chemo treatment last Tuesday. She states that she was feeling fine until Saturday when she started having some dizziness that morning. She also had some nausea and took her Zofran which helped. She states that after the dizziness and nausea she felt good the rest of the day. She has also been very fatigued.

## 2015-09-19 NOTE — Telephone Encounter (Signed)
Coming in at 1045 for lab, 11 for IVF    bce

## 2015-09-19 NOTE — Telephone Encounter (Addendum)
Called and reported that since chemo on Thursday, she has been having bouts of nausea and dizziness. She is reporting dizzy and light headed when she gets up. She is drinking 3 bottles of water a day. Orders?

## 2015-09-20 ENCOUNTER — Ambulatory Visit: Payer: 59 | Admitting: Hematology and Oncology

## 2015-09-20 ENCOUNTER — Encounter: Payer: Self-pay | Admitting: Hematology and Oncology

## 2015-09-20 ENCOUNTER — Other Ambulatory Visit: Payer: 59

## 2015-09-20 NOTE — Progress Notes (Signed)
[facility] day:  09/19/2015  Chief Complaint: Emma Atkinson is a 49 y.o. female with clinical stage IIB Her2/neu+ right breast cancer s/p biopsy who is seen for sick call visit on day 7 of cycle #1 adriamycin and Cytoxan (AC).  HPI:  The patient was last seen in the medical oncology clinic on 09/05/2015.  At that time, her work-up was reviewed and her treatment plan finalized.  Decision was made to proceed with 4 cycles of AC followed by 12 weeks of Taxol, Perjeta x 4 cycles, and 1 year of adjuvant Herceptin.  She was seen by Dr. Rudean Hitt on 09/13/2015 for cycle #1 AC.  She tolerated her chemotherapy well, except for severe facial erythema/flushing after initiation of Decadron and Emend.  She denied any shortness of breath.  She received Benadryl and Pepcid.  Her premedications were temporarly held then restarted slowly.  She tolerated the remainder of her Decadron and subsequent chemotherapy well.  She returned the following day in [place] for Neulasta.  She states that on Friday, 09/16/2015, she felt fine. On Saturday, 09/17/2015, she stood up in the morning and felt dizzy. She felt nauseous twice on Saturday and took Zofran.  She felt better.  She noted a "little diarrhea here and there".  On _0 /11/2014   . Abdominal hysterectomy  2001   . Tonsillectomy    . Back surgery      FOR SCOLIOSIS-HERRINGTON RODS IN PLACE  . Portacath placement N/A 08/30/2015    Procedure: INSERTION PORT-A-CATH;  Surgeon: Leonie Green, MD;  Location: [facility] ORS;  Service: General;  Laterality: N/A;    Family History  Problem Relation Age of Onset  . Breast cancer Maternal Aunt 64  . Cancer Father   . Cancer Sister   . Cancer Maternal Grandfather     Social History:  reports that she quit smoking about 15 years ago. Her smoking use included Cigarettes. She has a 10 pack-year smoking history. She does not have any smokeless tobacco history on file. She reports that she drinks alcohol. She reports that she does not use illicit drugs.  The patient is accompanied by her mother and daughter, Emma Atkinson, today.  Allergies:  Allergies  Allergen Reactions  . Ceftin [Cefuroxime Axetil] Rash    Current Medications: Current Outpatient Prescriptions  Medication Sig Dispense Refill  . ALPRAZolam (XANAX) 0.25 MG tablet Take 1/4 tablet up to three times daily as needed    . diphenhydrAMINE (SOMINEX) 25 MG tablet Take 25 mg by mouth at bedtime.     . lidocaine-prilocaine (EMLA) cream Apply 1 application topically as needed. Apply at least 1-2 hours before the port is to be used. 30 g  1  . LORazepam (ATIVAN) 0.5 MG tablet Take 1 tablet (0.5 mg total) by mouth every 6 (six) hours as needed (Nausea or vomiting). 30 tablet 0  . Multiple Vitamin (MULTIVITAMIN) capsule Take 1 capsule by mouth daily.    . ondansetron (ZOFRAN) 8 MG tablet Take 1 tablet (8 mg total) by mouth 2 (two) times daily as needed. Start on the third day after chemotherapy. 30 tablet 1   No current facility-administered medications for this visit.    Review of Systems:  GENERAL:  Feels a little tired.  No fevers or sweats.  Weight down 3 pounds in 1 week. PERFORMANCE STATUS (ECOG):  1 HEENT:  No visual changes, runny nose, sore throat, mouth sores or tenderness. Lungs: No shortness  of breath or cough.  No hemoptysis. Cardiac:  No chest pain, palpitations, orthopnea, or PND. GI:  No current nausea or vomiting.  No current diarrhea.  No constipation, melena or hematochezia. GU:  No urgency, frequency, dysuria, or hematuria. Musculoskeletal:  No back pain.  No joint pain.  No muscle tenderness. Extremities:  No pain or swelling. Skin:  No rashes or skin changes. Neuro:  Dizzy in morning x 3.  No numbness or weakness, balance or coordination issues. Endocrine:  No diabetes, thyroid issues, hot flashes or night sweats. Psych:  No mood changes or depression. Pain:  No focal pain. Review of systems:  All other systems reviewed and found to be negative.  Physical Exam: Blood pressure 115/81, pulse 75, temperature 95.9 F (35.5 C), temperature source Tympanic, resp. rate 18, height _0  (1.549 m), weight 108 lb 0.4 oz (49 kg).  No orthostasis. GENERAL:  Thin woman sitting comfortably in the exam room in no acute distress. MENTAL STATUS:  Alert and oriented to person, place and time. HEAD:  Blonde frosted hair.  Normocephalic, atraumatic, face symmetric (thin), no Cushingoid features. EYES:  Blue eyes.  Pupils equal round and reactive to light and accomodation.  No conjunctivitis or scleral icterus. ENT:  Oropharynx clear without lesion.  Tongue normal. Mucous membranes moist.  RESPIRATORY:  Clear to auscultation without rales, wheezes or rhonchi. CARDIOVASCULAR:  Regular rate and rhythm without murmur, rub or gallop. No JVD. ABDOMEN:  Soft, non-tender, with active bowel sounds, and no hepatosplenomegaly.  No masses. SKIN:  No rashes, ulcers or lesions. EXTREMITIES: No edema, no skin discoloration or tenderness.  No palpable cords. LYMPH NODES: No palpable cervical, supraclavicular, axillary or inguinal adenopathy  NEUROLOGICAL: Unremarkable. PSYCH:  Appropriate.   Appointment on 09/19/2015  Component Date Value Ref Range Status  . WBC 09/19/2015 1.0* 3.6 - 11.0 K/uL  Final   Comment: RESULT REPEATED AND VERIFIED [facility]ER [facility]ENTER CRITICAL VALUE PROTOCOL   . RBC 09/19/2015 4.13  3.80 - 5.20 MIL/uL Final  . Hemoglobin 09/19/2015 12.7  12.0 - 16.0 g/dL Final  . HCT 09/19/2015 38.8  35.0 - 47.0 % Final  . MCV 09/19/2015 94.0  80.0 - 100.0 fL Final  . MCH 09/19/2015 30.9  26.0 - 34.0 pg Final  . MCHC 09/19/2015 32.8  32.0 - 36.0 g/dL Final  . RDW 09/19/2015 12.8  11.5 - 14.5 % Final  . Platelets 09/19/2015 192  150 - 440 K/uL Final  . Neutrophils Relative % 09/19/2015 21%   Final  . Neutro Abs 09/19/2015 0.2* 1.4 - 6.5 K/uL Final  . Lymphocytes Relative 09/19/2015 67%   Final  . Lymphs Abs 09/19/2015 0.7* 1.0 - 3.6 K/uL Final  . Monocytes Relative 09/19/2015 4%  Final  . Monocytes Absolute 09/19/2015 0.0* 0.2 - 0.9 K/uL Final  . Eosinophils Relative 09/19/2015 7%   Final  . Eosinophils Absolute 09/19/2015 0.1  0 - 0.7 K/uL Final  . Basophils Relative 09/19/2015 1%   Final  . Basophils Absolute 09/19/2015 0.0  0 - 0.1 K/uL Final  . Sodium 09/19/2015 135  135 - 145 mmol/L Final  . Potassium 09/19/2015 3.7  3.5 - 5.1 mmol/L Final  . Chloride 09/19/2015 101  101 - 111 mmol/L Final  . CO2 09/19/2015 26  22 - 32 mmol/L Final  . Glucose, Bld 09/19/2015 91  65 - 99 mg/dL Final  . BUN 09/19/2015 11  6 - 20 mg/dL Final  . Creatinine, Ser 09/19/2015 0.57  0.44 - 1.00 mg/dL Final  . Calcium 09/19/2015 9.4  8.9 - 10.3 mg/dL Final  . GFR calc non Af Amer 09/19/2015 >60  >60 mL/min Final  . GFR calc Af Amer 09/19/2015 >60  >60 mL/min Final   Comment: (NOTE) The eGFR has been calculated using the CKD EPI equation. This calculation has not been validated in all clinical situations. eGFR's persistently <60 mL/min signify possible Chronic Kidney Disease.   . Anion gap 09/19/2015 8  5 - 15 Final  . Cortisol, Plasma 09/19/2015 6.4   Final   Comment: (NOTE) AM    6.7 - 22.6 ug/dL PM   <10.0       ug/dL Performed at [facility]     Assessment:  SHACARA COZINE is a 49 y.o. female with clinical stage T2N1 (stage IIB) Her2/neu + right breast cancer status post biopsy.  She presented with a minimally painful right breast mass with nipple inversion.  Bilateral mammogram and ultrasound on 08/12/2015 revealed a 4.1 cm right superior breast mass containing pleomorphic microcalcifications. There were several satellite lesions (1.3 cm, 0.9 cm, 0.6 cm) plus a 1.3 cm mass versus complicated cyst and a 0.8 cm cyst.  There was one 5 mm right axillary node with irregular thickened cortex.   Left breast revealed a 1.5 cm cyst containing intramural nodule versus debris at the 9:00 position 2 cm from the nipple. There was also an indeterminate left breast 7 mm nodule at the 2:00 position 4 cm from the nipple.  Left breast biopsy at the 9:00 position on 08/17/2015 revealed intraductal papilloma with fibrocystic changes.   Core needle biopsy on 08/17/2015 of the right breast mass revealed a grade 3 invasive mammary carcinoma. Right axillary biopsy confirmed metastatic mammary carcinoma. Tumor was ER positive (1-10%), PR positive (11-50%) and HER-2/neu 3+  PET scan on 08/26/2015 revealed hypermetabolic right breast mass (SUV 11.3) with a hypermetabolic right axillary lymph node and a subtle focus of very faint hypermetabolic activity laterally in the right breast. There was faint hypermetabolic activity at the biopsy site of the left breast (? biopsy related).  CA27.29 was 19.6 on 08/22/2015.  She is s/p partial hysterectomy.  She is premenopausal ([place] 5 and estradiol 271.9) on 08/22/2015.  Echocardiogram on 08/26/2015 revealed an ejection fraction of 55-60%.  She has a family history of breast and ovarian cancer.  My Risk genetic testing revealed no mutation on 08/22/2015.  She is day 7 of cycle #1 AC (09/13/2015) with Neulasta support.  Cycle #1 was complicated by facial flushing secondary to Decadron and Emend.  She tolerated the remainder of the infusion with Benadryl  and Pepcid premedication and slowing down the infusion.  She has had 3 episodes of dizziness in  the morning.  She is currently not dizzy or orthostatic.  She does not eat in the morning (? hypoglycemia).  She is neutropenic.  Exam is unremarkable.  Plan: 1.  Labs today:  CBC with diff, BMP, cortisol level. 2.  Orthostatics +/- IVF. 3.  Discuss caloric intake.   4.  Discuss fever and neutropenia precautions. 5.  Rx:  Ativan 0.5 mg po q 6 hours prn nausea.  She is not to take Xanax if takes Ativan. 6.  RTC on 09/20/2015 for weight check. 7.  RTC on 09/27/2015 for MD assessment, labs (CBC with diff, CMP, Mg) and cycle #2 AC (slow infusion of Decadron with premeds).    Lequita Asal, MD  09/19/2015, 12:06 PM

## 2015-09-22 ENCOUNTER — Inpatient Hospital Stay: Payer: 59 | Admitting: Hematology and Oncology

## 2015-09-22 VITALS — Wt 111.3 lb

## 2015-09-22 DIAGNOSIS — C50811 Malignant neoplasm of overlapping sites of right female breast: Secondary | ICD-10-CM

## 2015-09-23 ENCOUNTER — Inpatient Hospital Stay: Payer: 59

## 2015-09-23 ENCOUNTER — Other Ambulatory Visit: Payer: Self-pay

## 2015-09-23 ENCOUNTER — Telehealth: Payer: Self-pay

## 2015-09-23 DIAGNOSIS — C50811 Malignant neoplasm of overlapping sites of right female breast: Secondary | ICD-10-CM

## 2015-09-23 DIAGNOSIS — Z5111 Encounter for antineoplastic chemotherapy: Secondary | ICD-10-CM | POA: Diagnosis not present

## 2015-09-23 LAB — COMPREHENSIVE METABOLIC PANEL
ALT: 49 U/L (ref 14–54)
AST: 21 U/L (ref 15–41)
Albumin: 4 g/dL (ref 3.5–5.0)
Alkaline Phosphatase: 86 U/L (ref 38–126)
Anion gap: 8 (ref 5–15)
BUN: 9 mg/dL (ref 6–20)
CO2: 25 mmol/L (ref 22–32)
Calcium: 8.9 mg/dL (ref 8.9–10.3)
Chloride: 103 mmol/L (ref 101–111)
Creatinine, Ser: 0.65 mg/dL (ref 0.44–1.00)
GFR calc Af Amer: >60 mL/min (ref >60)
GFR calc non Af Amer: >60 mL/min (ref >60)
Glucose, Bld: 120 mg/dL — ABNORMAL HIGH (ref 65–99)
Potassium: 3.5 mmol/L (ref 3.5–5.1)
Sodium: 136 mmol/L (ref 135–145)
Total Bilirubin: 0.2 mg/dL — ABNORMAL LOW (ref 0.3–1.2)
Total Protein: 6.6 g/dL (ref 6.5–8.1)

## 2015-09-23 LAB — CBC WITH DIFFERENTIAL/PLATELET
Basophils Absolute: 0 10*3/uL (ref 0–0.1)
Basophils Relative: 0 %
Eosinophils Absolute: 0.1 10*3/uL (ref 0–0.7)
Eosinophils Relative: 1 %
HCT: 35.4 % (ref 35.0–47.0)
Hemoglobin: 11.6 g/dL — ABNORMAL LOW (ref 12.0–16.0)
Lymphocytes Relative: 14 %
Lymphs Abs: 1.9 10*3/uL (ref 1.0–3.6)
MCH: 30.6 pg (ref 26.0–34.0)
MCHC: 32.8 g/dL (ref 32.0–36.0)
MCV: 93.4 fL (ref 80.0–100.0)
Monocytes Absolute: 0.8 10*3/uL (ref 0.2–0.9)
Monocytes Relative: 6 %
Neutro Abs: 10.3 10*3/uL — ABNORMAL HIGH (ref 1.4–6.5)
Neutrophils Relative %: 79 %
Platelets: 232 10*3/uL (ref 150–440)
RBC: 3.79 MIL/uL — ABNORMAL LOW (ref 3.80–5.20)
RDW: 13.2 % (ref 11.5–14.5)
WBC: 13.1 10*3/uL — ABNORMAL HIGH (ref 3.6–11.0)

## 2015-09-23 LAB — MAGNESIUM: Magnesium: 2.2 mg/dL (ref 1.7–2.4)

## 2015-09-24 ENCOUNTER — Other Ambulatory Visit: Payer: Self-pay | Admitting: Hematology and Oncology

## 2015-09-27 ENCOUNTER — Inpatient Hospital Stay: Payer: 59

## 2015-09-27 ENCOUNTER — Inpatient Hospital Stay (HOSPITAL_BASED_OUTPATIENT_CLINIC_OR_DEPARTMENT_OTHER): Payer: 59 | Admitting: Hematology and Oncology

## 2015-09-27 ENCOUNTER — Ambulatory Visit
Admission: RE | Admit: 2015-09-27 | Discharge: 2015-09-27 | Disposition: A | Discharge status: Home / Self Care | Payer: 59 | Source: Ambulatory Visit | Attending: Hematology and Oncology | Admitting: Hematology and Oncology

## 2015-09-27 VITALS — BP 121/78 | HR 80 | Temp 97.8°F | Ht 61.0 in | Wt 109.6 lb

## 2015-09-27 DIAGNOSIS — F419 Anxiety disorder, unspecified: Secondary | ICD-10-CM

## 2015-09-27 DIAGNOSIS — D709 Neutropenia, unspecified: Secondary | ICD-10-CM

## 2015-09-27 DIAGNOSIS — Z803 Family history of malignant neoplasm of breast: Secondary | ICD-10-CM

## 2015-09-27 DIAGNOSIS — C50811 Malignant neoplasm of overlapping sites of right female breast: Secondary | ICD-10-CM

## 2015-09-27 DIAGNOSIS — Z95828 Presence of other vascular implants and grafts: Secondary | ICD-10-CM | POA: Diagnosis present

## 2015-09-27 DIAGNOSIS — R531 Weakness: Secondary | ICD-10-CM

## 2015-09-27 DIAGNOSIS — Z87891 Personal history of nicotine dependence: Secondary | ICD-10-CM

## 2015-09-27 DIAGNOSIS — R7989 Other specified abnormal findings of blood chemistry: Secondary | ICD-10-CM

## 2015-09-27 DIAGNOSIS — Z809 Family history of malignant neoplasm, unspecified: Secondary | ICD-10-CM

## 2015-09-27 DIAGNOSIS — Z862 Personal history of diseases of the blood and blood-forming organs and certain disorders involving the immune mechanism: Secondary | ICD-10-CM

## 2015-09-27 DIAGNOSIS — C50111 Malignant neoplasm of central portion of right female breast: Secondary | ICD-10-CM

## 2015-09-27 DIAGNOSIS — Z8041 Family history of malignant neoplasm of ovary: Secondary | ICD-10-CM

## 2015-09-27 DIAGNOSIS — Z79899 Other long term (current) drug therapy: Secondary | ICD-10-CM

## 2015-09-27 DIAGNOSIS — Z170 Estrogen receptor positive status [ER+]: Secondary | ICD-10-CM | POA: Diagnosis not present

## 2015-09-27 DIAGNOSIS — F329 Major depressive disorder, single episode, unspecified: Secondary | ICD-10-CM

## 2015-09-27 DIAGNOSIS — Z5111 Encounter for antineoplastic chemotherapy: Secondary | ICD-10-CM | POA: Diagnosis not present

## 2015-09-27 DIAGNOSIS — E2740 Unspecified adrenocortical insufficiency: Secondary | ICD-10-CM

## 2015-09-27 LAB — COMPREHENSIVE METABOLIC PANEL
ALT: 30 U/L (ref 14–54)
AST: 21 U/L (ref 15–41)
Albumin: 4.3 g/dL (ref 3.5–5.0)
Alkaline Phosphatase: 98 U/L (ref 38–126)
Anion gap: 6 (ref 5–15)
BUN: 10 mg/dL (ref 6–20)
CO2: 27 mmol/L (ref 22–32)
Calcium: 9.5 mg/dL (ref 8.9–10.3)
Chloride: 104 mmol/L (ref 101–111)
Creatinine, Ser: 0.71 mg/dL (ref 0.44–1.00)
GFR calc Af Amer: >60 mL/min (ref >60)
GFR calc non Af Amer: >60 mL/min (ref >60)
Glucose, Bld: 89 mg/dL (ref 65–99)
Potassium: 4.4 mmol/L (ref 3.5–5.1)
Sodium: 137 mmol/L (ref 135–145)
Total Bilirubin: 0.3 mg/dL (ref 0.3–1.2)
Total Protein: 7.1 g/dL (ref 6.5–8.1)

## 2015-09-27 LAB — CBC WITH DIFFERENTIAL/PLATELET
Basophils Absolute: 0 10*3/uL (ref 0–0.1)
Basophils Relative: 0 %
Eosinophils Absolute: 0 10*3/uL (ref 0–0.7)
Eosinophils Relative: 0 %
HCT: 37 % (ref 35.0–47.0)
Hemoglobin: 12.3 g/dL (ref 12.0–16.0)
Lymphocytes Relative: 14 %
Lymphs Abs: 1.9 10*3/uL (ref 1.0–3.6)
MCH: 30.8 pg (ref 26.0–34.0)
MCHC: 33.2 g/dL (ref 32.0–36.0)
MCV: 92.9 fL (ref 80.0–100.0)
Monocytes Absolute: 0.9 10*3/uL (ref 0.2–0.9)
Monocytes Relative: 7 %
Neutro Abs: 10.8 10*3/uL — ABNORMAL HIGH (ref 1.4–6.5)
Neutrophils Relative %: 79 %
Platelets: 260 10*3/uL (ref 150–440)
RBC: 3.98 MIL/uL (ref 3.80–5.20)
RDW: 13.2 % (ref 11.5–14.5)
WBC: 13.7 10*3/uL — ABNORMAL HIGH (ref 3.6–11.0)

## 2015-09-27 LAB — MAGNESIUM: Magnesium: 2.3 mg/dL (ref 1.7–2.4)

## 2015-09-27 MED ORDER — DOXORUBICIN HCL CHEMO IV INJECTION 2 MG/ML
60.0000 mg/m2 | Freq: Once | INTRAVENOUS | Status: AC
  Administered 2015-09-27: 88 mg via INTRAVENOUS
  Filled 2015-09-27: qty 44

## 2015-09-27 MED ORDER — SODIUM CHLORIDE 0.9 % IJ SOLN
10.0000 mL | INTRAMUSCULAR | Status: DC | PRN
  Filled 2015-09-27: qty 10

## 2015-09-27 MED ORDER — PALONOSETRON HCL INJECTION 0.25 MG/5ML
0.2500 mg | Freq: Once | INTRAVENOUS | Status: AC
  Administered 2015-09-27: 0.25 mg via INTRAVENOUS
  Filled 2015-09-27: qty 5

## 2015-09-27 MED ORDER — HEPARIN SOD (PORK) LOCK FLUSH 100 UNIT/ML IV SOLN
500.0000 [IU] | Freq: Once | INTRAVENOUS | Status: AC
  Administered 2015-09-27: 500 [IU] via INTRAVENOUS
  Filled 2015-09-27: qty 5

## 2015-09-27 MED ORDER — SODIUM CHLORIDE 0.9 % IV SOLN
600.0000 mg/m2 | Freq: Once | INTRAVENOUS | Status: AC
  Administered 2015-09-27: 880 mg via INTRAVENOUS
  Filled 2015-09-27: qty 44

## 2015-09-27 MED ORDER — PEGFILGRASTIM 6 MG/0.6ML ~~LOC~~ PSKT
6.0000 mg | PREFILLED_SYRINGE | Freq: Once | SUBCUTANEOUS | Status: AC
  Administered 2015-09-27: 6 mg via SUBCUTANEOUS

## 2015-09-27 MED ORDER — FOSAPREPITANT DIMEGLUMINE INJECTION 150 MG
Freq: Once | INTRAVENOUS | Status: AC
  Administered 2015-09-27: 16:00:00 via INTRAVENOUS
  Filled 2015-09-27: qty 5

## 2015-09-27 MED ORDER — ALTEPLASE 2 MG IJ SOLR
2.0000 mg | Freq: Once | INTRAMUSCULAR | Status: AC | PRN
  Administered 2015-09-27: 2 mg
  Filled 2015-09-27: qty 2

## 2015-09-27 MED ORDER — FAMOTIDINE IN NACL 20-0.9 MG/50ML-% IV SOLN
20.0000 mg | Freq: Two times a day (BID) | INTRAVENOUS | Status: DC
  Administered 2015-09-27: 20 mg via INTRAVENOUS
  Filled 2015-09-27: qty 50

## 2015-09-27 MED ORDER — DIPHENHYDRAMINE HCL 50 MG/ML IJ SOLN
25.0000 mg | Freq: Once | INTRAMUSCULAR | Status: AC
  Administered 2015-09-27: 25 mg via INTRAVENOUS
  Filled 2015-09-27: qty 1

## 2015-09-27 MED ORDER — HEPARIN SOD (PORK) LOCK FLUSH 100 UNIT/ML IV SOLN
500.0000 [IU] | Freq: Once | INTRAVENOUS | Status: DC | PRN
Start: 2015-09-27 — End: 2015-09-28

## 2015-09-27 MED ORDER — PEGFILGRASTIM 6 MG/0.6ML ~~LOC~~ PSKT
PREFILLED_SYRINGE | SUBCUTANEOUS | Status: AC
  Filled 2015-09-27: qty 0.6

## 2015-09-27 MED ORDER — SODIUM CHLORIDE 0.9 % IV SOLN
Freq: Once | INTRAVENOUS | Status: AC
  Administered 2015-09-27: 15:00:00 via INTRAVENOUS
  Filled 2015-09-27: qty 1000

## 2015-09-28 ENCOUNTER — Ambulatory Visit: Payer: 59

## 2015-09-28 NOTE — Progress Notes (Signed)
09/27/15 pt had cathflo activase used on port with positive results, blood return noted after one hour, started chemo after notifying Dr. Mike Gip

## 2015-10-03 NOTE — Telephone Encounter (Signed)
Erroneous note encounter  

## 2015-10-10 ENCOUNTER — Encounter: Payer: Self-pay | Admitting: Hematology and Oncology

## 2015-10-10 ENCOUNTER — Other Ambulatory Visit: Payer: Self-pay | Admitting: Hematology and Oncology

## 2015-10-10 NOTE — Progress Notes (Signed)
[facility] day:  09/27/2015  Chief Complaint: Emma Atkinson is a 49 y.o. female with clinical stage IIB Her2/neu+ right breast cancer who is seen for assessment prior to cycle #2 neoadjuvant adriamycin and Cytoxan (AC).  HPI:  The patient was last seen in the medical oncology clinic on 09/19/2015.  At that time, she was seen for sick call visit.  She noted a 3 day history of dizziness, mostly in the morning.  She felt nauseous twice  and took Zofran.  She noted a "little diarrhea here and there".  She believed that she was eating and drinking well, although her weight was down 3 pounds.  She was not eating in the morning.  She had no fever.  She was not orthostatic.  She was drinking well.  She was neutropenic ([place] 200).  Sodium was 135 and potassium 3.7.  Cortisol was 6.4 at 11:54 AM (6.7-22.6).  Because of a low serum cortisol, she was referred to endocrinology, Dr. Gabriel Carina.  She has an appointment on 09/29/2015.  During the interim, she has felt better. Her weight is up 2 pounds. She denies any nausea. She feels back to herself. She has had no further episodes.  Past Medical History  Diagnosis Date  . Breast cancer ([place])   . Anxiety   . Depression   . Anemia     H/O  . Complication of anesthesia     HARD TO WAKE UP AFTER TONSILLECTOMY AS A CHILD BUT NO PROBLEMS SINCE    Past Surgical History  Procedure Laterality Date  . Breast biopsy Bilateral     08/17/2015   . Abdominal hysterectomy  2001  . Tonsillectomy    . Back surgery      FOR SCOLIOSIS-HERRINGTON RODS IN PLACE  . Portacath placement N/A 08/30/2015    Procedure: INSERTION PORT-A-CATH;  Surgeon: Leonie Green, MD;  Location: [facility] ORS;  Service: General;  Laterality: N/A;    Family History  Problem Relation Age of Onset  . Breast cancer Maternal Aunt 64  . Cancer Father   . Cancer Sister   . Cancer Maternal Grandfather     Social History:  reports that she quit smoking  about 15 years ago. Her smoking use included Cigarettes. She has a 10 pack-year smoking history. She does not have any smokeless tobacco history on file. She reports that she drinks alcohol. She reports that she does not use illicit drugs.  The patient is accompanied by her husband today.  Allergies:  Allergies  Allergen Reactions  . Ceftin [Cefuroxime Axetil] Rash    Current Medications: Current Outpatient Prescriptions  Medication Sig Dispense Refill  . ALPRAZolam (XANAX) 0.25 MG tablet Take 1/4 tablet up to three times daily as needed    . diphenhydrAMINE (SOMINEX) 25 MG tablet Take 25 mg by mouth at bedtime.     . lidocaine-prilocaine (EMLA) cream Apply 1 application topically as needed. Apply at least 1-2 hours before the port is to be used. 30 g 1  . LORazepam (ATIVAN) 0.5 MG tablet Take 1 tablet (0.5 mg total) by mouth every 6 (six) hours as needed (Nausea or vomiting). 30 tablet 0  . Multiple Vitamin (MULTIVITAMIN) capsule Take 1 capsule by mouth daily.    . ondansetron (ZOFRAN) 8 MG tablet Take 1 tablet (8 mg total) by mouth 2 (two) times daily as needed. Start on the third day after chemotherapy. 30 tablet 1   No current facility-administered medications  for this visit.    Review of Systems:  GENERAL:  Feels better.  No fevers or sweats.  Weight up 2 pounds. PERFORMANCE STATUS (ECOG):  1 HEENT:  No visual changes, runny nose, sore throat, mouth sores or tenderness. Lungs: No shortness of breath or cough.  No hemoptysis. Cardiac:  No chest pain, palpitations, orthopnea, or PND. GI:  No nausea, vomiting, diarrhea, constipation, melena or hematochezia. GU:  No urgency, frequency, dysuria, or hematuria. Musculoskeletal:  No back pain.  No joint pain.  No muscle tenderness. Extremities:  No pain or swelling. Skin:  No rashes or skin changes. Neuro:  No recurrent dizzy episodes.  No numbness or weakness, balance or coordination issues. Endocrine:  No diabetes, thyroid issues, hot  flashes or night sweats. Psych:  No mood changes or depression. Pain:  No focal pain. Review of systems:  All other systems reviewed and found to be negative.  Physical Exam: Blood pressure 121/78, pulse 80, temperature 97.8 F (36.6 C), temperature source Tympanic, height _0  (1.549 m), weight 109 lb 9.1 oz (49.7 kg).  No orthostasis. GENERAL:  Thin woman sitting comfortably in the exam room in no acute distress. MENTAL STATUS:  Alert and oriented to person, place and time. HEAD:  Shoulder length blonde frosted hair.  Normocephalic, atraumatic, face symmetric (thin), no Cushingoid features. EYES:  Blue eyes.  Pupils equal round and reactive to light and accomodation.  No conjunctivitis or scleral icterus. ENT:  Oropharynx clear without lesion.  Tongue normal. Mucous membranes moist.  RESPIRATORY:  Clear to auscultation without rales, wheezes or rhonchi. CARDIOVASCULAR:  Regular rate and rhythm without murmur, rub or gallop. No JVD. ABDOMEN:  Soft, non-tender, with active bowel sounds, and no hepatosplenomegaly.  No masses. SKIN:  No rashes, ulcers or lesions. EXTREMITIES: No edema, no skin discoloration or tenderness.  No palpable cords. LYMPH NODES: No palpable cervical, supraclavicular, axillary or inguinal adenopathy  NEUROLOGICAL: Unremarkable. PSYCH:  Appropriate.   Infusion on 09/27/2015  Component Date Value Ref Range Status  . WBC 09/27/2015 13.7* 3.6 - 11.0 K/uL Final  . RBC 09/27/2015 3.98  3.80 - 5.20 MIL/uL Final  . Hemoglobin 09/27/2015 12.3  12.0 - 16.0 g/dL Final  . HCT 09/27/2015 37.0  35.0 - 47.0 % Final  . MCV 09/27/2015 92.9  80.0 - 100.0 fL Final  . MCH 09/27/2015 30.8  26.0 - 34.0 pg Final  . MCHC 09/27/2015 33.2  32.0 - 36.0 g/dL Final  . RDW 09/27/2015 13.2  11.5 - 14.5 % Final  . Platelets 09/27/2015 260  150 - 440 K/uL Final  . Neutrophils Relative % 09/27/2015 79   Final  . Neutro Abs 09/27/2015 10.8* 1.4 - 6.5 K/uL Final  . Lymphocytes Relative  09/27/2015 14   Final  . Lymphs Abs 09/27/2015 1.9  1.0 - 3.6 K/uL Final  . Monocytes Relative 09/27/2015 7   Final  . Monocytes Absolute 09/27/2015 0.9  0.2 - 0.9 K/uL Final  . Eosinophils Relative 09/27/2015 0   Final  . Eosinophils Absolute 09/27/2015 0.0  0 - 0.7 K/uL Final  . Basophils Relative 09/27/2015 0   Final  . Basophils Absolute 09/27/2015 0.0  0 - 0.1 K/uL Final  . Sodium 09/27/2015 137  135 - 145 mmol/L Final  . Potassium 09/27/2015 4.4  3.5 - 5.1 mmol/L Final  . Chloride 09/27/2015 104  101 - 111 mmol/L Final  . CO2 09/27/2015 27  22 - 32 mmol/L Final  . Glucose, Bld 09/27/2015 89  65 - 99 mg/dL Final  . BUN 09/27/2015 10  6 - 20 mg/dL Final  . Creatinine, Ser 09/27/2015 0.71  0.44 - 1.00 mg/dL Final  . Calcium 09/27/2015 9.5  8.9 - 10.3 mg/dL Final  . Total Protein 09/27/2015 7.1  6.5 - 8.1 g/dL Final  . Albumin 09/27/2015 4.3  3.5 - 5.0 g/dL Final  . AST 09/27/2015 21  15 - 41 U/L Final  . ALT 09/27/2015 30  14 - 54 U/L Final  . Alkaline Phosphatase 09/27/2015 98  38 - 126 U/L Final  . Total Bilirubin 09/27/2015 0.3  0.3 - 1.2 mg/dL Final  . GFR calc non Af Amer 09/27/2015 >60  >60 mL/min Final  . GFR calc Af Amer 09/27/2015 >60  >60 mL/min Final   Comment: (NOTE) The eGFR has been calculated using the CKD EPI equation. This calculation has not been validated in all clinical situations. eGFR's persistently <60 mL/min signify possible Chronic Kidney Disease.   . Anion gap 09/27/2015 6  5 - 15 Final  . Magnesium 09/27/2015 2.3  1.7 - 2.4 mg/dL Final    Assessment:  Emma Atkinson is a 49 y.o. female with clinical stage T2N1 (stage IIB) Her2/neu + right breast cancer status post biopsy.  She presented with a minimally painful right breast mass with nipple inversion.  Bilateral mammogram and ultrasound on 08/12/2015 revealed a 4.1 cm right superior breast mass containing pleomorphic microcalcifications. There were several satellite lesions (1.3 cm, 0.9 cm, 0.6 cm)  plus a 1.3 cm mass versus complicated cyst and a 0.8 cm cyst.  There was one 5 mm right axillary node with irregular thickened cortex.   Left breast revealed a 1.5 cm cyst containing intramural nodule versus debris at the 9:00 position 2 cm from the nipple. There was also an indeterminate left breast 7 mm nodule at the 2:00 position 4 cm from the nipple.  Left breast biopsy at the 9:00 position on 08/17/2015 revealed intraductal papilloma with fibrocystic changes.   Core needle biopsy on 08/17/2015 of the right breast mass revealed a grade 3 invasive mammary carcinoma. Right axillary biopsy confirmed metastatic mammary carcinoma. Tumor was ER positive (1-10%), PR positive (11-50%) and HER-2/neu 3+  PET scan on 08/26/2015 revealed hypermetabolic right breast mass (SUV 11.3) with a hypermetabolic right axillary lymph node and a subtle focus of very faint hypermetabolic activity laterally in the right breast. There was faint hypermetabolic activity at the biopsy site of the left breast (? biopsy related).  CA27.29 was 19.6 on 08/22/2015.  She is s/p partial hysterectomy.  She is premenopausal ([place] 5 and estradiol 271.9) on 08/22/2015.  Echocardiogram on 08/26/2015 revealed an ejection fraction of 55-60%.  She has a family history of breast and ovarian cancer.  My Risk genetic testing revealed no mutation on 08/22/2015.  She is status post 1 cycle of AC (09/13/2015) with Neulasta support.  Cycle #1 was complicated by facial flushing secondary to Decadron and Emend.  She tolerated the remainder of the infusion with Benadryl and Pepcid premedication and slowing down the infusion.  She developed neutropenia (ANC 200).  She had 3 episodes of dizziness in the morning.  Cortisol level was low on 09/19/2015 at noon.   Symptomatically, she feels back to baseline.  Exam is unremarkable.  Port-a-cath is not working.  Plan: 1.  Labs today:  CBC with diff, CMP, Mg. 2.  Copy of My Risk genetic testing provided.   Results reviewed (no mutation). 3.  CXR (PA  and lateral): line placement.  If line in good position, cathflo prior to chemo. 4.  Cycle #2 AC today with premeds (after line working). 5.  RTC tomorrow in [place] for Neulasta.   6.  Re-review fever and neutropenia precautions.  7.  Follow-up with Dr. Gabriel Carina this week re: evaluation of low cortisol. 8.  RTC in 2 weeks for MD assessment, labs (CBC with diff, CMP, Mg) and cycle #3 AC.   Addendum:  CXR reveals port in good position.   Lequita Asal, MD  09/27/2015, 5:52 PM

## 2015-10-11 ENCOUNTER — Inpatient Hospital Stay (HOSPITAL_BASED_OUTPATIENT_CLINIC_OR_DEPARTMENT_OTHER): Payer: 59 | Admitting: Hematology and Oncology

## 2015-10-11 ENCOUNTER — Other Ambulatory Visit: Payer: 59

## 2015-10-11 ENCOUNTER — Ambulatory Visit: Payer: 59

## 2015-10-11 ENCOUNTER — Ambulatory Visit: Payer: 59 | Admitting: Hematology and Oncology

## 2015-10-11 ENCOUNTER — Inpatient Hospital Stay: Payer: 59

## 2015-10-11 VITALS — BP 147/81 | HR 97 | Temp 98.1°F | Resp 18 | Ht 61.0 in | Wt 108.7 lb

## 2015-10-11 DIAGNOSIS — R634 Abnormal weight loss: Secondary | ICD-10-CM

## 2015-10-11 DIAGNOSIS — Z170 Estrogen receptor positive status [ER+]: Secondary | ICD-10-CM

## 2015-10-11 DIAGNOSIS — Z8041 Family history of malignant neoplasm of ovary: Secondary | ICD-10-CM

## 2015-10-11 DIAGNOSIS — F419 Anxiety disorder, unspecified: Secondary | ICD-10-CM

## 2015-10-11 DIAGNOSIS — C50811 Malignant neoplasm of overlapping sites of right female breast: Secondary | ICD-10-CM

## 2015-10-11 DIAGNOSIS — D709 Neutropenia, unspecified: Secondary | ICD-10-CM

## 2015-10-11 DIAGNOSIS — Z79899 Other long term (current) drug therapy: Secondary | ICD-10-CM

## 2015-10-11 DIAGNOSIS — Z5111 Encounter for antineoplastic chemotherapy: Secondary | ICD-10-CM | POA: Diagnosis not present

## 2015-10-11 DIAGNOSIS — Z7689 Persons encountering health services in other specified circumstances: Secondary | ICD-10-CM

## 2015-10-11 DIAGNOSIS — D72829 Elevated white blood cell count, unspecified: Secondary | ICD-10-CM

## 2015-10-11 DIAGNOSIS — R531 Weakness: Secondary | ICD-10-CM

## 2015-10-11 DIAGNOSIS — Z809 Family history of malignant neoplasm, unspecified: Secondary | ICD-10-CM

## 2015-10-11 DIAGNOSIS — Z803 Family history of malignant neoplasm of breast: Secondary | ICD-10-CM

## 2015-10-11 DIAGNOSIS — Z87891 Personal history of nicotine dependence: Secondary | ICD-10-CM

## 2015-10-11 DIAGNOSIS — R197 Diarrhea, unspecified: Secondary | ICD-10-CM

## 2015-10-11 DIAGNOSIS — R42 Dizziness and giddiness: Secondary | ICD-10-CM

## 2015-10-11 DIAGNOSIS — Z862 Personal history of diseases of the blood and blood-forming organs and certain disorders involving the immune mechanism: Secondary | ICD-10-CM

## 2015-10-11 DIAGNOSIS — F329 Major depressive disorder, single episode, unspecified: Secondary | ICD-10-CM

## 2015-10-11 LAB — COMPREHENSIVE METABOLIC PANEL
ALT: 24 U/L (ref 14–54)
AST: 16 U/L (ref 15–41)
Albumin: 4.5 g/dL (ref 3.5–5.0)
Alkaline Phosphatase: 125 U/L (ref 38–126)
Anion gap: 7 (ref 5–15)
BUN: 11 mg/dL (ref 6–20)
CO2: 26 mmol/L (ref 22–32)
Calcium: 8.8 mg/dL — ABNORMAL LOW (ref 8.9–10.3)
Chloride: 103 mmol/L (ref 101–111)
Creatinine, Ser: 0.69 mg/dL (ref 0.44–1.00)
GFR calc Af Amer: >60 mL/min (ref >60)
GFR calc non Af Amer: >60 mL/min (ref >60)
Glucose, Bld: 100 mg/dL — ABNORMAL HIGH (ref 65–99)
Potassium: 3.5 mmol/L (ref 3.5–5.1)
Sodium: 136 mmol/L (ref 135–145)
Total Bilirubin: 0.6 mg/dL (ref 0.3–1.2)
Total Protein: 7.1 g/dL (ref 6.5–8.1)

## 2015-10-11 LAB — MAGNESIUM: Magnesium: 2.1 mg/dL (ref 1.7–2.4)

## 2015-10-11 LAB — CBC WITH DIFFERENTIAL/PLATELET
Basophils Absolute: 0 10*3/uL (ref 0–0.1)
Basophils Relative: 0 %
Eosinophils Absolute: 0 10*3/uL (ref 0–0.7)
Eosinophils Relative: 0 %
HCT: 33.6 % — ABNORMAL LOW (ref 35.0–47.0)
Hemoglobin: 11.1 g/dL — ABNORMAL LOW (ref 12.0–16.0)
Lymphocytes Relative: 7 %
Lymphs Abs: 1.7 10*3/uL (ref 1.0–3.6)
MCH: 30.3 pg (ref 26.0–34.0)
MCHC: 33 g/dL (ref 32.0–36.0)
MCV: 91.6 fL (ref 80.0–100.0)
Monocytes Absolute: 1.3 10*3/uL — ABNORMAL HIGH (ref 0.2–0.9)
Monocytes Relative: 6 %
Neutro Abs: 20.1 10*3/uL — ABNORMAL HIGH (ref 1.4–6.5)
Neutrophils Relative %: 87 %
Platelets: 214 10*3/uL (ref 150–440)
RBC: 3.67 MIL/uL — ABNORMAL LOW (ref 3.80–5.20)
RDW: 13.6 % (ref 11.5–14.5)
WBC: 23.1 10*3/uL — ABNORMAL HIGH (ref 3.6–11.0)

## 2015-10-11 MED ORDER — HEPARIN SOD (PORK) LOCK FLUSH 100 UNIT/ML IV SOLN
500.0000 [IU] | Freq: Once | INTRAVENOUS | Status: AC
  Administered 2015-10-11: 500 [IU] via INTRAVENOUS
  Filled 2015-10-11: qty 5

## 2015-10-11 MED ORDER — SODIUM CHLORIDE 0.9 % IJ SOLN
10.0000 mL | INTRAMUSCULAR | Status: DC | PRN
  Administered 2015-10-11: 10 mL via INTRAVENOUS
  Filled 2015-10-11: qty 10

## 2015-10-11 NOTE — Progress Notes (Signed)
[facility]:  [place]   Chief Complaint: Emma Atkinson is a 49 y.o. female with clinical stage IIB Her2/neu+ right breast cancer who is seen for assessment prior to cycle #3 neoadjuvant adriamycin and Cytoxan (AC).  HPI:  The patient was last seen in the medical oncology clinic on 09/27/2015.  At that time, she felt back to herself. She has had no further dizzy episodes.  She received cycle #2 AC with Neulasta support.  She had no reaction with her premedication regimen.  She has been seen by endocrinology.  Repeat cortisol level was low (1.9).    During the interim, she notes nausea after chemotherapy.  She has taken ondansetron and Ativan.  She does not like Ensure.  She has had no further dizzy spells ("fuzzy or out of it").  She denies any runny nose, sore throat, cough, or urinary symptoms.  Past Medical History  Diagnosis Date  . Breast cancer ([place])   . Anxiety   . Depression   . Anemia     H/O  . Complication of anesthesia     HARD TO WAKE UP AFTER TONSILLECTOMY AS A CHILD BUT NO PROBLEMS SINCE    Past Surgical History  Procedure Laterality Date  . Breast biopsy Bilateral     08/17/2015   . Abdominal hysterectomy  2001  . Tonsillectomy    . Back surgery      FOR SCOLIOSIS-HERRINGTON RODS IN PLACE  . Portacath placement N/A 08/30/2015    Procedure: INSERTION PORT-A-CATH;  Surgeon: Leonie Green, MD;  Location: [facility] ORS;  Service: General;  Laterality: N/A;    Family History  Problem Relation Age of Onset  . Breast cancer Maternal Aunt 64  . Cancer Father   . Cancer Sister   . Cancer Maternal Grandfather     Social History:  reports that she quit smoking about 15 years ago. Her smoking use included Cigarettes. She has a 10 pack-year smoking history. She does not have any smokeless tobacco history on file. She reports that she drinks alcohol. She reports that she does not use illicit drugs.  The patient is  accompanied by her husband today.  Allergies:  Allergies  Allergen Reactions  . Ceftin [Cefuroxime Axetil] Rash    Current Medications: Current Outpatient Prescriptions  Medication Sig Dispense Refill  . ALPRAZolam (XANAX) 0.25 MG tablet Take 1/4 tablet up to three times daily as needed    . Dexlansoprazole (DEXILANT) 30 MG capsule Take 30 mg by mouth daily.    . diphenhydrAMINE (SOMINEX) 25 MG tablet Take 25 mg by mouth at bedtime.     . lidocaine-prilocaine (EMLA) cream Apply 1 application topically as needed. Apply at least 1-2 hours before the port is to be used. 30 g 1  . LORazepam (ATIVAN) 0.5 MG tablet Take 1 tablet (0.5 mg total) by mouth every 6 (six) hours as needed (Nausea or vomiting). 30 tablet 0  . Multiple Vitamin (MULTIVITAMIN) capsule Take 1 capsule by mouth daily.    . ondansetron (ZOFRAN) 8 MG tablet Take 1 tablet (8 mg total) by mouth 2 (two) times daily as needed. Start on the third day after chemotherapy. 30 tablet 1   No current facility-administered medications for this visit.   Facility-Administered Medications Ordered in Other Visits  Medication Dose Route Frequency Provider Last Rate Last Dose  . heparin lock flush 100 unit/mL  500 Units Intravenous Once Lequita Asal, MD      .  sodium chloride 0.9 % injection 10 mL  10 mL Intravenous PRN Lequita Asal, MD   10 mL at 10/11/15 1307    Review of Systems:  GENERAL:  Feels good.  No fevers or sweats.  Weight down 1 pound. PERFORMANCE STATUS (ECOG):  1 HEENT:  No visual changes, runny nose, sore throat, mouth sores or tenderness. Lungs: No shortness of breath or cough.  No hemoptysis. Cardiac:  No chest pain, palpitations, orthopnea, or PND. GI:  No nausea, vomiting, diarrhea, constipation, melena or hematochezia. GU:  No urgency, frequency, dysuria, or hematuria. Musculoskeletal:  No back pain.  No joint pain.  No muscle tenderness. Extremities:  No pain or swelling. Skin:  No rashes or skin  changes. Neuro:  No recurrent dizzy episodes.  No numbness or weakness, balance or coordination issues. Endocrine:  No diabetes, thyroid issues, hot flashes or night sweats. Psych:  No mood changes or depression. Pain:  No focal pain. Review of systems:  All other systems reviewed and found to be negative.  Physical Exam: Blood pressure 147/81, pulse 97, temperature 98.1 F (36.7 C), temperature source Tympanic, resp. rate 18, height '5\' 1"'  (1.549 m), weight 108 lb 11 oz (49.3 kg).  No orthostasis. GENERAL:  Thin woman sitting comfortably in the exam room in no acute distress. MENTAL STATUS:  Alert and oriented to person, place and time. HEAD:  Pearline Cables knit hat.  Alopecia.  Normocephalic, atraumatic, face symmetric (thin), no Cushingoid features. EYES:  Blue eyes.  Pupils equal round and reactive to light and accomodation.  No conjunctivitis or scleral icterus. ENT:  Oropharynx clear without lesion.  Tongue normal. Mucous membranes moist.  RESPIRATORY:  Clear to auscultation without rales, wheezes or rhonchi. CARDIOVASCULAR:  Regular rate and rhythm without murmur, rub or gallop. No JVD. BREAST:  Right breast with 4.5 x 3. 5 cm central mass.  No skin changes or nipple discharge.   ABDOMEN:  Soft, non-tender, with active bowel sounds, and no hepatosplenomegaly.  No masses. SKIN:  No rashes, ulcers or lesions. EXTREMITIES: No edema, no skin discoloration or tenderness.  No palpable cords. LYMPH NODES: No palpable cervical, supraclavicular, axillary or inguinal adenopathy  NEUROLOGICAL: Unremarkable. PSYCH:  Appropriate.   Infusion on [place]  Component Date Value Ref Range Status  . WBC [place] 23.1* 3.6 - 11.0 K/uL Final  . RBC [place] 3.67* 3.80 - 5.20 MIL/uL Final  . Hemoglobin [place] 11.1* 12.0 - 16.0 g/dL Final  . HCT [place] 33.6* 35.0 - 47.0 % Final  . MCV [place] 91.6  80.0 - 100.0 fL Final  . MCH [place] 30.3  26.0 - 34.0 pg Final  . MCHC [place] 33.0   32.0 - 36.0 g/dL Final  . RDW [place] 13.6  11.5 - 14.5 % Final  . Platelets [place] 214  150 - 440 K/uL Final  . Neutrophils Relative % [place] 87   Final  . Neutro Abs [place] 20.1* 1.4 - 6.5 K/uL Final  . Lymphocytes Relative [place] 7   Final  . Lymphs Abs [place] 1.7  1.0 - 3.6 K/uL Final  . Monocytes Relative [place] 6   Final  . Monocytes Absolute [place] 1.3* 0.2 - 0.9 K/uL Final  . Eosinophils Relative [place] 0   Final  . Eosinophils Absolute [place] 0.0  0 - 0.7 K/uL Final  . Basophils Relative [place] 0   Final  . Basophils Absolute [place] 0.0  0 - 0.1 K/uL Final  . Sodium [place] 136  135 - 145 mmol/L  Final  . Potassium [place] 3.5  3.5 - 5.1 mmol/L Final  . Chloride [place] 103  101 - 111 mmol/L Final  . CO2 [place] 26  22 - 32 mmol/L Final  . Glucose, Bld [place] 100* 65 - 99 mg/dL Final  . BUN [place] 11  6 - 20 mg/dL Final  . Creatinine, Ser [place] 0.69  0.44 - 1.00 mg/dL Final  . Calcium [place] 8.8* 8.9 - 10.3 mg/dL Final  . Total Protein [place] 7.1  6.5 - 8.1 g/dL Final  . Albumin [place] 4.5  3.5 - 5.0 g/dL Final  . AST [place] 16  15 - 41 U/L Final  . ALT [place] 24  14 - 54 U/L Final  . Alkaline Phosphatase [place] 125  38 - 126 U/L Final  . Total Bilirubin [place] 0.6  0.3 - 1.2 mg/dL Final  . GFR calc non Af Amer [place] >60  >60 mL/min Final  . GFR calc Af Amer [place] >60  >60 mL/min Final   Comment: (NOTE) The eGFR has been calculated using the CKD EPI equation. This calculation has not been validated in all clinical situations. eGFR's persistently <60 mL/min signify possible Chronic Kidney Disease.   . Anion gap [place] 7  5 - 15 Final  . Magnesium [place] 2.1  1.7 - 2.4 mg/dL Final    Assessment:  WISDOM SEYBOLD is a 49 y.o. female with clinical stage T2N1 (stage IIB) Her2/neu + right breast cancer status post biopsy.  She presented with a minimally painful  right breast mass with nipple inversion.  Bilateral mammogram and ultrasound on 08/12/2015 revealed a 4.1 cm right superior breast mass containing pleomorphic microcalcifications. There were several satellite lesions (1.3 cm, 0.9 cm, 0.6 cm) plus a 1.3 cm mass versus complicated cyst and a 0.8 cm cyst.  There was one 5 mm right axillary node with irregular thickened cortex.   Left breast revealed a 1.5 cm cyst containing intramural nodule versus debris at the 9:00 position 2 cm from the nipple. There was also an indeterminate left breast 7 mm nodule at the 2:00 position 4 cm from the nipple.  Left breast biopsy at the 9:00 position on 08/17/2015 revealed intraductal papilloma with fibrocystic changes.   Core needle biopsy on 08/17/2015 of the right breast mass revealed a grade 3 invasive mammary carcinoma. Right axillary biopsy confirmed metastatic mammary carcinoma. Tumor was ER positive (1-10%), PR positive (11-50%) and HER-2/neu 3+  PET scan on 08/26/2015 revealed hypermetabolic right breast mass (SUV 11.3) with a hypermetabolic right axillary lymph node and a subtle focus of very faint hypermetabolic activity laterally in the right breast. There was faint hypermetabolic activity at the biopsy site of the left breast (? biopsy related).  CA27.29 was 19.6 on 08/22/2015.  She is s/p partial hysterectomy.  She is premenopausal ([place] 5 and estradiol 271.9) on 08/22/2015.  Echo on 08/26/2015 revealed an ejection fraction of 55-60%.  She has a family history of breast and ovarian cancer.  My Risk genetic testing revealed no mutation on 08/22/2015.  She is status post 2 cycles of AC (09/13/2015 - 09/27/2015) with Neulasta support.  Cycle #1 was complicated by facial flushing secondary to Decadron and Emend.  She tolerated the remainder of the infusion with Benadryl and Pepcid premedication and slowing down the infusion.  She developed neutropenia (ANC 200).  She had 3 episodes of dizziness in the morning.   Cortisol level was low x 2 (seen by endocrinology).   Symptomatically, she feels  good.  Exam is unremarkable.  Plan: 1.  Labs today:  CBC with diff, CMP, Mg. 2.  Discuss unexplained leukocytosis.  Etiology secondary to residual Neulasta effect or possible infection.  Patient to contact clinic if becomes ill (fever, chills, or other concerning symptoms). 3.  No chemotherapy today. 4.  RTC in 1 week for MD assessment, labs (CBC with diff, CMP) and cycle #3 AC.    Lequita Asal, MD  [place], 2:04 PM

## 2015-10-17 ENCOUNTER — Encounter: Payer: Self-pay | Admitting: Hematology and Oncology

## 2015-10-18 ENCOUNTER — Inpatient Hospital Stay (HOSPITAL_BASED_OUTPATIENT_CLINIC_OR_DEPARTMENT_OTHER): Payer: 59 | Admitting: Hematology and Oncology

## 2015-10-18 ENCOUNTER — Inpatient Hospital Stay: Payer: 59 | Attending: Hematology and Oncology

## 2015-10-18 VITALS — BP 129/84 | HR 90 | Temp 98.7°F | Resp 84 | Ht 61.0 in | Wt 111.6 lb

## 2015-10-18 DIAGNOSIS — Z803 Family history of malignant neoplasm of breast: Secondary | ICD-10-CM | POA: Insufficient documentation

## 2015-10-18 DIAGNOSIS — Z79899 Other long term (current) drug therapy: Secondary | ICD-10-CM | POA: Insufficient documentation

## 2015-10-18 DIAGNOSIS — Z87891 Personal history of nicotine dependence: Secondary | ICD-10-CM | POA: Insufficient documentation

## 2015-10-18 DIAGNOSIS — F329 Major depressive disorder, single episode, unspecified: Secondary | ICD-10-CM | POA: Insufficient documentation

## 2015-10-18 DIAGNOSIS — R109 Unspecified abdominal pain: Secondary | ICD-10-CM | POA: Diagnosis not present

## 2015-10-18 DIAGNOSIS — Z170 Estrogen receptor positive status [ER+]: Secondary | ICD-10-CM | POA: Diagnosis not present

## 2015-10-18 DIAGNOSIS — C50811 Malignant neoplasm of overlapping sites of right female breast: Secondary | ICD-10-CM

## 2015-10-18 DIAGNOSIS — F419 Anxiety disorder, unspecified: Secondary | ICD-10-CM | POA: Insufficient documentation

## 2015-10-18 DIAGNOSIS — R110 Nausea: Secondary | ICD-10-CM | POA: Diagnosis not present

## 2015-10-18 DIAGNOSIS — D709 Neutropenia, unspecified: Secondary | ICD-10-CM | POA: Diagnosis not present

## 2015-10-18 DIAGNOSIS — R5383 Other fatigue: Secondary | ICD-10-CM | POA: Diagnosis not present

## 2015-10-18 DIAGNOSIS — Z8041 Family history of malignant neoplasm of ovary: Secondary | ICD-10-CM | POA: Diagnosis not present

## 2015-10-18 DIAGNOSIS — Z809 Family history of malignant neoplasm, unspecified: Secondary | ICD-10-CM

## 2015-10-18 DIAGNOSIS — Z7689 Persons encountering health services in other specified circumstances: Secondary | ICD-10-CM | POA: Diagnosis not present

## 2015-10-18 DIAGNOSIS — R42 Dizziness and giddiness: Secondary | ICD-10-CM | POA: Diagnosis not present

## 2015-10-18 DIAGNOSIS — R531 Weakness: Secondary | ICD-10-CM | POA: Diagnosis not present

## 2015-10-18 DIAGNOSIS — N63 Unspecified lump in breast: Secondary | ICD-10-CM | POA: Insufficient documentation

## 2015-10-18 DIAGNOSIS — C50911 Malignant neoplasm of unspecified site of right female breast: Secondary | ICD-10-CM | POA: Diagnosis not present

## 2015-10-18 DIAGNOSIS — T380X5A Adverse effect of glucocorticoids and synthetic analogues, initial encounter: Secondary | ICD-10-CM | POA: Insufficient documentation

## 2015-10-18 DIAGNOSIS — D72829 Elevated white blood cell count, unspecified: Secondary | ICD-10-CM | POA: Insufficient documentation

## 2015-10-18 DIAGNOSIS — Z5111 Encounter for antineoplastic chemotherapy: Secondary | ICD-10-CM | POA: Insufficient documentation

## 2015-10-18 LAB — CBC WITH DIFFERENTIAL/PLATELET
Basophils Absolute: 0 10*3/uL (ref 0–0.1)
Basophils Relative: 0 %
Eosinophils Absolute: 0 10*3/uL (ref 0–0.7)
Eosinophils Relative: 0 %
HCT: 34.9 % — ABNORMAL LOW (ref 35.0–47.0)
Hemoglobin: 11.6 g/dL — ABNORMAL LOW (ref 12.0–16.0)
Lymphocytes Relative: 14 %
Lymphs Abs: 1.2 10*3/uL (ref 1.0–3.6)
MCH: 31.2 pg (ref 26.0–34.0)
MCHC: 33.4 g/dL (ref 32.0–36.0)
MCV: 93.5 fL (ref 80.0–100.0)
Monocytes Absolute: 0.7 10*3/uL (ref 0.2–0.9)
Monocytes Relative: 8 %
Neutro Abs: 6.9 10*3/uL — ABNORMAL HIGH (ref 1.4–6.5)
Neutrophils Relative %: 78 %
Platelets: 361 10*3/uL (ref 150–440)
RBC: 3.73 MIL/uL — ABNORMAL LOW (ref 3.80–5.20)
RDW: 15.1 % — ABNORMAL HIGH (ref 11.5–14.5)
WBC: 8.9 10*3/uL (ref 3.6–11.0)

## 2015-10-18 LAB — COMPREHENSIVE METABOLIC PANEL
ALT: 24 U/L (ref 14–54)
AST: 21 U/L (ref 15–41)
Albumin: 4.5 g/dL (ref 3.5–5.0)
Alkaline Phosphatase: 85 U/L (ref 38–126)
Anion gap: 7 (ref 5–15)
BUN: 15 mg/dL (ref 6–20)
CO2: 25 mmol/L (ref 22–32)
Calcium: 8.9 mg/dL (ref 8.9–10.3)
Chloride: 103 mmol/L (ref 101–111)
Creatinine, Ser: 0.69 mg/dL (ref 0.44–1.00)
GFR calc Af Amer: >60 mL/min (ref >60)
GFR calc non Af Amer: >60 mL/min (ref >60)
Glucose, Bld: 79 mg/dL (ref 65–99)
Potassium: 3.5 mmol/L (ref 3.5–5.1)
Sodium: 135 mmol/L (ref 135–145)
Total Bilirubin: 0.5 mg/dL (ref 0.3–1.2)
Total Protein: 7.5 g/dL (ref 6.5–8.1)

## 2015-10-19 ENCOUNTER — Inpatient Hospital Stay: Payer: 59

## 2015-10-19 ENCOUNTER — Other Ambulatory Visit: Payer: Self-pay | Admitting: Hematology and Oncology

## 2015-10-19 VITALS — BP 126/80 | HR 84 | Temp 97.7°F | Resp 20

## 2015-10-19 DIAGNOSIS — T380X5A Adverse effect of glucocorticoids and synthetic analogues, initial encounter: Secondary | ICD-10-CM | POA: Diagnosis not present

## 2015-10-19 DIAGNOSIS — R42 Dizziness and giddiness: Secondary | ICD-10-CM | POA: Diagnosis not present

## 2015-10-19 DIAGNOSIS — D709 Neutropenia, unspecified: Secondary | ICD-10-CM | POA: Diagnosis not present

## 2015-10-19 DIAGNOSIS — Z5111 Encounter for antineoplastic chemotherapy: Secondary | ICD-10-CM | POA: Diagnosis not present

## 2015-10-19 DIAGNOSIS — Z170 Estrogen receptor positive status [ER+]: Secondary | ICD-10-CM | POA: Diagnosis not present

## 2015-10-19 DIAGNOSIS — Z7689 Persons encountering health services in other specified circumstances: Secondary | ICD-10-CM | POA: Diagnosis not present

## 2015-10-19 DIAGNOSIS — C50911 Malignant neoplasm of unspecified site of right female breast: Secondary | ICD-10-CM | POA: Diagnosis not present

## 2015-10-19 DIAGNOSIS — R5383 Other fatigue: Secondary | ICD-10-CM | POA: Diagnosis not present

## 2015-10-19 DIAGNOSIS — R531 Weakness: Secondary | ICD-10-CM | POA: Diagnosis not present

## 2015-10-19 DIAGNOSIS — C50111 Malignant neoplasm of central portion of right female breast: Secondary | ICD-10-CM

## 2015-10-19 MED ORDER — SODIUM CHLORIDE 0.9 % IJ SOLN
10.0000 mL | INTRAMUSCULAR | Status: DC | PRN
  Filled 2015-10-19: qty 10

## 2015-10-19 MED ORDER — HEPARIN SOD (PORK) LOCK FLUSH 100 UNIT/ML IV SOLN
500.0000 [IU] | Freq: Once | INTRAVENOUS | Status: AC | PRN
  Administered 2015-10-19: 500 [IU]
  Filled 2015-10-19: qty 5

## 2015-10-19 MED ORDER — PALONOSETRON HCL INJECTION 0.25 MG/5ML
0.2500 mg | Freq: Once | INTRAVENOUS | Status: AC
  Administered 2015-10-19: 0.25 mg via INTRAVENOUS
  Filled 2015-10-19: qty 5

## 2015-10-19 MED ORDER — DIPHENHYDRAMINE HCL 50 MG/ML IJ SOLN
25.0000 mg | Freq: Once | INTRAMUSCULAR | Status: AC
  Administered 2015-10-19: 25 mg via INTRAVENOUS
  Filled 2015-10-19: qty 1

## 2015-10-19 MED ORDER — FAMOTIDINE IN NACL 20-0.9 MG/50ML-% IV SOLN
20.0000 mg | Freq: Two times a day (BID) | INTRAVENOUS | Status: DC
  Administered 2015-10-19: 20 mg via INTRAVENOUS
  Filled 2015-10-19: qty 50

## 2015-10-19 MED ORDER — SODIUM CHLORIDE 0.9 % IV SOLN
Freq: Once | INTRAVENOUS | Status: AC
  Administered 2015-10-19: 15:00:00 via INTRAVENOUS
  Filled 2015-10-19: qty 5

## 2015-10-19 MED ORDER — PEGFILGRASTIM 6 MG/0.6ML ~~LOC~~ PSKT
6.0000 mg | PREFILLED_SYRINGE | Freq: Once | SUBCUTANEOUS | Status: AC
  Administered 2015-10-19: 6 mg via SUBCUTANEOUS
  Filled 2015-10-19: qty 0.6

## 2015-10-19 MED ORDER — SODIUM CHLORIDE 0.9 % IV SOLN
Freq: Once | INTRAVENOUS | Status: AC
  Administered 2015-10-19: 14:00:00 via INTRAVENOUS
  Filled 2015-10-19: qty 1000

## 2015-10-19 MED ORDER — DOXORUBICIN HCL CHEMO IV INJECTION 2 MG/ML
60.0000 mg/m2 | Freq: Once | INTRAVENOUS | Status: AC
  Administered 2015-10-19: 88 mg via INTRAVENOUS
  Filled 2015-10-19: qty 44

## 2015-10-19 MED ORDER — SODIUM CHLORIDE 0.9 % IV SOLN
600.0000 mg/m2 | Freq: Once | INTRAVENOUS | Status: AC
  Administered 2015-10-19: 880 mg via INTRAVENOUS
  Filled 2015-10-19: qty 44

## 2015-10-21 ENCOUNTER — Telehealth: Payer: Self-pay | Admitting: *Deleted

## 2015-10-21 NOTE — Telephone Encounter (Signed)
Called to report that she had her first chemo treatment Wed and has woke up this morning with facial swelling. Asking if this is normal or does she need to be concerned

## 2015-10-21 NOTE — Telephone Encounter (Signed)
Per Dr Mike Gip puffiness is nml, but if she has a lot of edema, need to be evaluated. Per pt, there is only some puffiness, nothing major. She was advised to call back if it becomes worse. She repeated this back to me

## 2015-10-27 ENCOUNTER — Inpatient Hospital Stay (HOSPITAL_BASED_OUTPATIENT_CLINIC_OR_DEPARTMENT_OTHER): Payer: 59 | Admitting: Hematology and Oncology

## 2015-10-27 ENCOUNTER — Telehealth: Payer: Self-pay | Admitting: *Deleted

## 2015-10-27 ENCOUNTER — Inpatient Hospital Stay: Payer: 59

## 2015-10-27 VITALS — BP 123/71 | HR 98 | Temp 97.9°F | Resp 18 | Ht 61.0 in | Wt 106.9 lb

## 2015-10-27 DIAGNOSIS — R42 Dizziness and giddiness: Secondary | ICD-10-CM | POA: Diagnosis not present

## 2015-10-27 DIAGNOSIS — F419 Anxiety disorder, unspecified: Secondary | ICD-10-CM

## 2015-10-27 DIAGNOSIS — D72829 Elevated white blood cell count, unspecified: Secondary | ICD-10-CM

## 2015-10-27 DIAGNOSIS — C50111 Malignant neoplasm of central portion of right female breast: Secondary | ICD-10-CM

## 2015-10-27 DIAGNOSIS — D709 Neutropenia, unspecified: Secondary | ICD-10-CM | POA: Diagnosis not present

## 2015-10-27 DIAGNOSIS — N63 Unspecified lump in breast: Secondary | ICD-10-CM

## 2015-10-27 DIAGNOSIS — R5383 Other fatigue: Secondary | ICD-10-CM

## 2015-10-27 DIAGNOSIS — Z5189 Encounter for other specified aftercare: Secondary | ICD-10-CM

## 2015-10-27 DIAGNOSIS — Z170 Estrogen receptor positive status [ER+]: Secondary | ICD-10-CM | POA: Diagnosis not present

## 2015-10-27 DIAGNOSIS — R110 Nausea: Secondary | ICD-10-CM

## 2015-10-27 DIAGNOSIS — Z87891 Personal history of nicotine dependence: Secondary | ICD-10-CM

## 2015-10-27 DIAGNOSIS — R634 Abnormal weight loss: Secondary | ICD-10-CM

## 2015-10-27 DIAGNOSIS — Z809 Family history of malignant neoplasm, unspecified: Secondary | ICD-10-CM

## 2015-10-27 DIAGNOSIS — Z7689 Persons encountering health services in other specified circumstances: Secondary | ICD-10-CM | POA: Diagnosis not present

## 2015-10-27 DIAGNOSIS — R531 Weakness: Secondary | ICD-10-CM | POA: Diagnosis not present

## 2015-10-27 DIAGNOSIS — T380X5A Adverse effect of glucocorticoids and synthetic analogues, initial encounter: Secondary | ICD-10-CM | POA: Diagnosis not present

## 2015-10-27 DIAGNOSIS — R109 Unspecified abdominal pain: Secondary | ICD-10-CM

## 2015-10-27 DIAGNOSIS — Z803 Family history of malignant neoplasm of breast: Secondary | ICD-10-CM

## 2015-10-27 DIAGNOSIS — C50911 Malignant neoplasm of unspecified site of right female breast: Secondary | ICD-10-CM | POA: Diagnosis not present

## 2015-10-27 DIAGNOSIS — Z8041 Family history of malignant neoplasm of ovary: Secondary | ICD-10-CM

## 2015-10-27 DIAGNOSIS — F329 Major depressive disorder, single episode, unspecified: Secondary | ICD-10-CM

## 2015-10-27 DIAGNOSIS — Z5111 Encounter for antineoplastic chemotherapy: Secondary | ICD-10-CM | POA: Diagnosis not present

## 2015-10-27 DIAGNOSIS — Z79899 Other long term (current) drug therapy: Secondary | ICD-10-CM

## 2015-10-27 LAB — CBC WITH DIFFERENTIAL/PLATELET
Basophils Absolute: 0 10*3/uL (ref 0–0.1)
Basophils Relative: 1 %
Eosinophils Absolute: 0 10*3/uL (ref 0–0.7)
Eosinophils Relative: 1 %
HCT: 33.1 % — ABNORMAL LOW (ref 35.0–47.0)
Hemoglobin: 11.1 g/dL — ABNORMAL LOW (ref 12.0–16.0)
Lymphocytes Relative: 16 %
Lymphs Abs: 0.7 10*3/uL — ABNORMAL LOW (ref 1.0–3.6)
MCH: 30.9 pg (ref 26.0–34.0)
MCHC: 33.6 g/dL (ref 32.0–36.0)
MCV: 92 fL (ref 80.0–100.0)
Monocytes Absolute: 0.4 10*3/uL (ref 0.2–0.9)
Monocytes Relative: 10 %
Neutro Abs: 3.1 10*3/uL (ref 1.4–6.5)
Neutrophils Relative %: 73 %
Platelets: 242 10*3/uL (ref 150–440)
RBC: 3.59 MIL/uL — ABNORMAL LOW (ref 3.80–5.20)
RDW: 14.9 % — ABNORMAL HIGH (ref 11.5–14.5)
WBC: 4.2 10*3/uL (ref 3.6–11.0)

## 2015-10-27 LAB — COMPREHENSIVE METABOLIC PANEL
ALT: 41 U/L (ref 14–54)
AST: 24 U/L (ref 15–41)
Albumin: 4.3 g/dL (ref 3.5–5.0)
Alkaline Phosphatase: 122 U/L (ref 38–126)
Anion gap: 7 (ref 5–15)
BUN: 11 mg/dL (ref 6–20)
CO2: 27 mmol/L (ref 22–32)
Calcium: 9.4 mg/dL (ref 8.9–10.3)
Chloride: 102 mmol/L (ref 101–111)
Creatinine, Ser: 0.62 mg/dL (ref 0.44–1.00)
GFR calc Af Amer: >60 mL/min (ref >60)
GFR calc non Af Amer: >60 mL/min (ref >60)
Glucose, Bld: 92 mg/dL (ref 65–99)
Potassium: 3.2 mmol/L — ABNORMAL LOW (ref 3.5–5.1)
Sodium: 136 mmol/L (ref 135–145)
Total Bilirubin: 0.6 mg/dL (ref 0.3–1.2)
Total Protein: 7.5 g/dL (ref 6.5–8.1)

## 2015-10-27 LAB — MAGNESIUM: Magnesium: 2.1 mg/dL (ref 1.7–2.4)

## 2015-10-27 MED ORDER — POTASSIUM CHLORIDE ER 10 MEQ PO TBCR: 10.0000 meq | EXTENDED_RELEASE_TABLET | Freq: Two times a day (BID) | ORAL | Status: DC

## 2015-10-27 NOTE — Progress Notes (Signed)
[facility] day:  10/27/2015  Chief Complaint: Emma Atkinson is a 50 y.o. female with clinical stage IIB Her2/neu+ right breast cancer who is seen for sick call visit on day 9 status post cycle #3 neoadjuvant adriamycin and Cytoxan (AC).  HPI:  The patient was last seen in the medical oncology clinic on 10/18/2015.  At that time, she was doing well.  She felt back to herself after an additional week off of chemotherapy.  Labs revealed a hematocrit of 34.8, hemoglobin 11.6, platelets 135,000, WBC 8900 with an [place] 6900.  She received cycle #3 on 10/19/2015 with Neulasta On-Pro support.  During the interim, she has felt tired and weak.  She is eating small amounts.  She denies any emesis.  She has mild nausea.  She denies diarrhea.  She describes 1 dizzy spell after a hot shower.  She took a Zofran and layed down for 30 minutes.  She notes a "puffy face" x 2 days after chemotherapy.  She was tired on Friday.  She went home at lunchtime, ate lunch, and slept.    She describes "eating some, but not much".  She has tried to eat Mac and cheese, apple, and ice cream.  She states that her stomach hurts after eating.  She takes Miralax with her coffee.  She is sipping water.  She is voiding well (3-4 times a day).   Past Medical History  Diagnosis Date  . Breast cancer ([place])   . Anxiety   . Depression   . Anemia     H/O  . Complication of anesthesia     HARD TO WAKE UP AFTER TONSILLECTOMY AS A CHILD BUT NO PROBLEMS SINCE    Past Surgical History  Procedure Laterality Date  . Breast biopsy Bilateral     08/17/2015   . Abdominal hysterectomy  2001  . Tonsillectomy    . Back surgery      FOR SCOLIOSIS-HERRINGTON RODS IN PLACE  . Portacath placement N/A 08/30/2015    Procedure: INSERTION PORT-A-CATH;  Surgeon: Leonie Green, MD;  Location: [facility] ORS;  Service: General;  Laterality: N/A;    Family History  Problem Relation Age of Onset  . Breast  cancer Maternal Aunt 64  . Cancer Father   . Cancer Sister   . Cancer Maternal Grandfather     Social History:  reports that she quit smoking about 16 years ago. Her smoking use included Cigarettes. She has a 10 pack-year smoking history. She does not have any smokeless tobacco history on file. She reports that she drinks alcohol. She reports that she does not use illicit drugs.  The patient is accompanied by her mother, husband, and son-in-law today.  Allergies:  Allergies  Allergen Reactions  . Ceftin [Cefuroxime Axetil] Rash    Current Medications: Current Outpatient Prescriptions  Medication Sig Dispense Refill  . ALPRAZolam (XANAX) 0.25 MG tablet Take 1/4 tablet up to three times daily as needed    . Dexlansoprazole (DEXILANT) 30 MG capsule Take 30 mg by mouth daily.    . diphenhydrAMINE (SOMINEX) 25 MG tablet Take 25 mg by mouth at bedtime.     . lidocaine-prilocaine (EMLA) cream Apply 1 application topically as needed. Apply at least 1-2 hours before the port is to be used. 30 g 1  . LORazepam (ATIVAN) 0.5 MG tablet Take 1 tablet (0.5 mg total) by mouth every 6 (six) hours as needed (Nausea or vomiting). 30 tablet 0  .  Multiple Vitamin (MULTIVITAMIN) capsule Take 1 capsule by mouth daily.    . ondansetron (ZOFRAN) 8 MG tablet Take 1 tablet (8 mg total) by mouth 2 (two) times daily as needed. Start on the third day after chemotherapy. 30 tablet 1  . potassium chloride (K-DUR) 10 MEQ tablet Take 1 tablet (10 mEq total) by mouth 2 (two) times daily. For three days 10 tablet 0   No current facility-administered medications for this visit.    Review of Systems:  GENERAL:  Feels weak and tired.  No fevers or sweats.  Weight down 5 pounds. PERFORMANCE STATUS (ECOG):  1 HEENT:  No visual changes, runny nose, sore throat, mouth sores or tenderness. Lungs: No shortness of breath or cough.  No hemoptysis. Cardiac:  No chest pain, palpitations, orthopnea, or PND. GI:  Not much eating.   Mild nausea.  Taking Miralax.  No vomiting, diarrhea, constipation, melena or hematochezia. GU:  Voiding well.  No urgency, frequency, dysuria, or hematuria. Musculoskeletal:  No back pain.  No joint pain.  No muscle tenderness. Extremities:  No pain or swelling. Skin:  No rashes or skin changes. Neuro:  Dizzy spell after shower.  No numbness or weakness, balance or coordination issues. Endocrine:  No diabetes, thyroid issues, hot flashes or night sweats. Psych:  No mood changes or depression. Pain:  No focal pain. Review of systems:  All other systems reviewed and found to be negative.  Physical Exam: Blood pressure 123/71, pulse 98, temperature 97.9 F (36.6 C), temperature source Tympanic, resp. rate 18, height '5\' 1"'  (1.549 m), weight 106 lb 14.8 oz (48.5 kg).  No orthostasis. GENERAL:  Thin woman sitting comfortably in the exam room in no acute distress.  She is sipping water. MENTAL STATUS:  Alert and oriented to person, place and time. HEAD:  Alopecia.  Normocephalic, atraumatic, face symmetric (thin), no Cushingoid features. EYES:  Blue eyes.  Pupils equal round and reactive to light and accomodation.  No conjunctivitis or scleral icterus. ENT:  Oropharynx clear without lesion.  Tongue normal. Mucous membranes moist.  RESPIRATORY:  Clear to auscultation without rales, wheezes or rhonchi. CARDIOVASCULAR:  Regular rate and rhythm without murmur, rub or gallop. No JVD. ABDOMEN:  Soft, non-tender, with active bowel sounds, and no hepatosplenomegaly.  No masses. SKIN:  No rashes, ulcers or lesions. EXTREMITIES: No edema, no skin discoloration or tenderness.  No palpable cords. LYMPH NODES: No palpable cervical, supraclavicular, axillary or inguinal adenopathy  NEUROLOGICAL: Unremarkable. PSYCH:  Appropriate.   Appointment on 10/27/2015  Component Date Value Ref Range Status  . Sodium 10/27/2015 136  135 - 145 mmol/L Final  . Potassium 10/27/2015 3.2* 3.5 - 5.1 mmol/L Final  .  Chloride 10/27/2015 102  101 - 111 mmol/L Final  . CO2 10/27/2015 27  22 - 32 mmol/L Final  . Glucose, Bld 10/27/2015 92  65 - 99 mg/dL Final  . BUN 10/27/2015 11  6 - 20 mg/dL Final  . Creatinine, Ser 10/27/2015 0.62  0.44 - 1.00 mg/dL Final  . Calcium 10/27/2015 9.4  8.9 - 10.3 mg/dL Final  . Total Protein 10/27/2015 7.5  6.5 - 8.1 g/dL Final  . Albumin 10/27/2015 4.3  3.5 - 5.0 g/dL Final  . AST 10/27/2015 24  15 - 41 U/L Final  . ALT 10/27/2015 41  14 - 54 U/L Final  . Alkaline Phosphatase 10/27/2015 122  38 - 126 U/L Final  . Total Bilirubin 10/27/2015 0.6  0.3 - 1.2 mg/dL Final  .  GFR calc non Af Amer 10/27/2015 >60  >60 mL/min Final  . GFR calc Af Amer 10/27/2015 >60  >60 mL/min Final   Comment: (NOTE) The eGFR has been calculated using the CKD EPI equation. This calculation has not been validated in all clinical situations. eGFR's persistently <60 mL/min signify possible Chronic Kidney Disease.   . Anion gap 10/27/2015 7  5 - 15 Final  . WBC 10/27/2015 4.2  3.6 - 11.0 K/uL Final  . RBC 10/27/2015 3.59* 3.80 - 5.20 MIL/uL Final  . Hemoglobin 10/27/2015 11.1* 12.0 - 16.0 g/dL Final  . HCT 10/27/2015 33.1* 35.0 - 47.0 % Final  . MCV 10/27/2015 92.0  80.0 - 100.0 fL Final  . MCH 10/27/2015 30.9  26.0 - 34.0 pg Final  . MCHC 10/27/2015 33.6  32.0 - 36.0 g/dL Final  . RDW 10/27/2015 14.9* 11.5 - 14.5 % Final  . Platelets 10/27/2015 242  150 - 440 K/uL Final  . Neutrophils Relative % 10/27/2015 73   Final  . Neutro Abs 10/27/2015 3.1  1.4 - 6.5 K/uL Final  . Lymphocytes Relative 10/27/2015 16   Final  . Lymphs Abs 10/27/2015 0.7* 1.0 - 3.6 K/uL Final  . Monocytes Relative 10/27/2015 10   Final  . Monocytes Absolute 10/27/2015 0.4  0.2 - 0.9 K/uL Final  . Eosinophils Relative 10/27/2015 1   Final  . Eosinophils Absolute 10/27/2015 0.0  0 - 0.7 K/uL Final  . Basophils Relative 10/27/2015 1   Final  . Basophils Absolute 10/27/2015 0.0  0 - 0.1 K/uL Final  . Smear Review  10/27/2015 MORPHOLOGY UNREMARKABLE   Final   SMEAR SCANNED  . Magnesium 10/27/2015 2.1  1.7 - 2.4 mg/dL Final    Assessment:  CHAMPAGNE PALETTA is a 50 y.o. female with clinical stage T2N1 (stage IIB) Her2/neu + right breast cancer status post biopsy.  She presented with a minimally painful right breast mass with nipple inversion.  Bilateral mammogram and ultrasound on 08/12/2015 revealed a 4.1 cm right superior breast mass containing pleomorphic microcalcifications. There were several satellite lesions (1.3 cm, 0.9 cm, 0.6 cm) plus a 1.3 cm mass versus complicated cyst and a 0.8 cm cyst.  There was one 5 mm right axillary node with irregular thickened cortex.   Left breast revealed a 1.5 cm cyst containing intramural nodule versus debris at the 9:00 position 2 cm from the nipple. There was also an indeterminate left breast 7 mm nodule at the 2:00 position 4 cm from the nipple.  Left breast biopsy at the 9:00 position on 08/17/2015 revealed intraductal papilloma with fibrocystic changes.   Core needle biopsy on 08/17/2015 of the right breast mass revealed a grade 3 invasive mammary carcinoma. Right axillary biopsy confirmed metastatic mammary carcinoma. Tumor was ER positive (1-10%), PR positive (11-50%) and HER-2/neu 3+  PET scan on 08/26/2015 revealed hypermetabolic right breast mass (SUV 11.3) with a hypermetabolic right axillary lymph node and a subtle focus of very faint hypermetabolic activity laterally in the right breast. There was faint hypermetabolic activity at the biopsy site of the left breast (? biopsy related).  CA27.29 was 19.6 on 08/22/2015.  She is s/p partial hysterectomy.  She is premenopausal ([place] 5 and estradiol 271.9) on 08/22/2015.  Echo on 08/26/2015 revealed an ejection fraction of 55-60%.  She has a family history of breast and ovarian cancer.  My Risk genetic testing revealed no mutation on 08/22/2015.  She is currently day 9 status post cycle #3 AC (09/13/2015 -  10/19/2015)  with Neulasta support.  Cycle #1 was complicated by facial flushing secondary to Decadron and Emend.  She tolerated the remainder of the infusion with Benadryl and Pepcid premedication and slowing down the infusion.  She developed neutropenia (ANC 200).  She had 3 episodes of dizziness in the morning.  Cortisol level was low x 2 (seen by endocrinology). Cycle #2 was notable for leukocytosis (23,100) on day 15.  Symptomatically, she feels weak and fatigued.  Oral intake has decreased.  Weight has decreased by 5 pounds.  Exam is unremarkable.  Plan: 1.  Labs today:  CBC with diff, CMP. 2.  Orthostatic +/- IVF.  Patient is not orthostatic. 3.  Discuss importance of caloric intake and maintaining weight.  Discuss high caloric foods.  Ensure/Boost shakes.  Discuss small frequent meals. 4.  Discuss consideration of treatment every 3 weeks instead of 2 weeks. 5.  RTC as previously scheduled.   Lequita Asal, MD  10/27/2015, 11:57 AM

## 2015-10-27 NOTE — Progress Notes (Signed)
Patient is here for acute add on. She states that she has been feeling very weak and fatigues after her last chemo treatment last week. She states that she kept thinking that she was going to bounce back and just has not been able to. She has been eating, but it has been very small amounts. She denies any vomiting but has had some mild nausea. Patient did get dizzy after taking a shower on Tuesday.

## 2015-10-27 NOTE — Telephone Encounter (Signed)
Pt agrees to come in for labs and see md will try to be here at 1115

## 2015-10-27 NOTE — Telephone Encounter (Signed)
Called to report that she has been very weak since Saturday and is not bouncing back. She is not eating much, but is drinking water. Has no energy at all to shower takes all of her energy. She looks as if she has lost weight. What can be done?

## 2015-10-28 ENCOUNTER — Telehealth: Payer: Self-pay | Admitting: *Deleted

## 2015-10-28 NOTE — Telephone Encounter (Signed)
Per Dr Mike Gip pt to continue drinking, and eating cal back if gets worse. Keep appt for Tuesday. Pt informed and repeated this back to me

## 2015-10-28 NOTE — Telephone Encounter (Signed)
Called to say that she is no better or worse, she is trying to force herself to eat. She is still lightheadded today.

## 2015-10-31 ENCOUNTER — Encounter: Payer: Self-pay | Admitting: Hematology and Oncology

## 2015-10-31 NOTE — Progress Notes (Signed)
[facility] day:  10/18/2015  Chief Complaint: Emma Atkinson is a 50 y.o. female with clinical stage IIB Her2/neu+ right breast cancer who is seen for assessment prior to cycle #3 neoadjuvant adriamycin and Cytoxan (AC).  HPI:  The patient was last seen in the medical oncology clinic on 10/11/2015.  At that time, she was seen for assessment prior to cycle #3 AC.  Symptomatically, she felt good.  Exam was unremarkable.  CBC surprising revealed a WBC of 23,100 with a left shift.  Etiology was felt secondary to a delayed cumulative Neulasta effect or an infection.  Chemotherapy was held.  During the interim, she has felt great.  She feels "back to normal".  She denies any complaint.  Past Medical History  Diagnosis Date  . Breast cancer ([place])   . Anxiety   . Depression   . Anemia     H/O  . Complication of anesthesia     HARD TO WAKE UP AFTER TONSILLECTOMY AS A CHILD BUT NO PROBLEMS SINCE    Past Surgical History  Procedure Laterality Date  . Breast biopsy Bilateral     08/17/2015   . Abdominal hysterectomy  2001  . Tonsillectomy    . Back surgery      FOR SCOLIOSIS-HERRINGTON RODS IN PLACE  . Portacath placement N/A 08/30/2015    Procedure: INSERTION PORT-A-CATH;  Surgeon: Leonie Green, MD;  Location: [facility] ORS;  Service: General;  Laterality: N/A;    Family History  Problem Relation Age of Onset  . Breast cancer Maternal Aunt 64  . Cancer Father   . Cancer Sister   . Cancer Maternal Grandfather     Social History:  reports that she quit smoking about 16 years ago. Her smoking use included Cigarettes. She has a 10 pack-year smoking history. She does not have any smokeless tobacco history on file. She reports that she drinks alcohol. She reports that she does not use illicit drugs.  The patient is accompanied by her son today.  Allergies:  Allergies  Allergen Reactions  . Ceftin [Cefuroxime Axetil] Rash    Current  Medications: Current Outpatient Prescriptions  Medication Sig Dispense Refill  . ALPRAZolam (XANAX) 0.25 MG tablet Take 1/4 tablet up to three times daily as needed    . Dexlansoprazole (DEXILANT) 30 MG capsule Take 30 mg by mouth daily.    . diphenhydrAMINE (SOMINEX) 25 MG tablet Take 25 mg by mouth at bedtime.     . lidocaine-prilocaine (EMLA) cream Apply 1 application topically as needed. Apply at least 1-2 hours before the port is to be used. 30 g 1  . LORazepam (ATIVAN) 0.5 MG tablet Take 1 tablet (0.5 mg total) by mouth every 6 (six) hours as needed (Nausea or vomiting). 30 tablet 0  . Multiple Vitamin (MULTIVITAMIN) capsule Take 1 capsule by mouth daily.    . ondansetron (ZOFRAN) 8 MG tablet Take 1 tablet (8 mg total) by mouth 2 (two) times daily as needed. Start on the third day after chemotherapy. 30 tablet 1  . potassium chloride (K-DUR) 10 MEQ tablet Take 1 tablet (10 mEq total) by mouth 2 (two) times daily. For three days 10 tablet 0   No current facility-administered medications for this visit.    Review of Systems:  GENERAL:  Feels great.  Back to normal.  No fevers or sweats.  Weight up 3 pounds. PERFORMANCE STATUS (ECOG):  1 HEENT:  No visual changes, runny nose,  sore throat, mouth sores or tenderness. Lungs: No shortness of breath or cough.  No hemoptysis. Cardiac:  No chest pain, palpitations, orthopnea, or PND. GI:  No nausea, vomiting, diarrhea, constipation, melena or hematochezia. GU:  No urgency, frequency, dysuria, or hematuria. Musculoskeletal:  No back pain.  No joint pain.  No muscle tenderness. Extremities:  No pain or swelling. Skin:  No rashes or skin changes. Neuro:  No recurrent dizzy episodes.  No numbness or weakness, balance or coordination issues. Endocrine:  No diabetes, thyroid issues, hot flashes or night sweats. Psych:  No mood changes or depression. Pain:  No focal pain. Review of systems:  All other systems reviewed and found to be  negative.  Physical Exam: Blood pressure 129/84, pulse 90, temperature 98.7 F (37.1 C), temperature source Tympanic, resp. rate 84, height 5\' 1"  (1.549 m), weight 111 lb 8.8 oz (50.6 kg).  No orthostasis. GENERAL:  Thin woman sitting comfortably in the exam room in no acute distress. MENTAL STATUS:  Alert and oriented to person, place and time. HEAD:  Long styled hair.  Normocephalic, atraumatic, face symmetric (thin), no Cushingoid features. EYES:  Blue eyes.  Pupils equal round and reactive to light and accomodation.  No conjunctivitis or scleral icterus. ENT:  Oropharynx clear without lesion.  Tongue normal. Mucous membranes moist.  RESPIRATORY:  Clear to auscultation without rales, wheezes or rhonchi. CARDIOVASCULAR:  Regular rate and rhythm without murmur, rub or gallop. No JVD. ABDOMEN:  Soft, non-tender, with active bowel sounds, and no hepatosplenomegaly.  No masses. SKIN:  No rashes, ulcers or lesions. EXTREMITIES: No edema, no skin discoloration or tenderness.  No palpable cords. LYMPH NODES: No palpable cervical, supraclavicular, axillary or inguinal adenopathy  NEUROLOGICAL: Unremarkable. PSYCH:  Appropriate.   Appointment on 10/18/2015  Component Date Value Ref Range Status  . WBC 10/18/2015 8.9  3.6 - 11.0 K/uL Final  . RBC 10/18/2015 3.73* 3.80 - 5.20 MIL/uL Final  . Hemoglobin 10/18/2015 11.6* 12.0 - 16.0 g/dL Final  . HCT 12/16/2015 34.9* 35.0 - 47.0 % Final  . MCV 10/18/2015 93.5  80.0 - 100.0 fL Final  . MCH 10/18/2015 31.2  26.0 - 34.0 pg Final  . MCHC 10/18/2015 33.4  32.0 - 36.0 g/dL Final  . RDW 12/16/2015 15.1* 11.5 - 14.5 % Final  . Platelets 10/18/2015 361  150 - 440 K/uL Final  . Neutrophils Relative % 10/18/2015 78   Final  . Neutro Abs 10/18/2015 6.9* 1.4 - 6.5 K/uL Final  . Lymphocytes Relative 10/18/2015 14   Final  . Lymphs Abs 10/18/2015 1.2  1.0 - 3.6 K/uL Final  . Monocytes Relative 10/18/2015 8   Final  . Monocytes Absolute 10/18/2015 0.7  0.2 -  0.9 K/uL Final  . Eosinophils Relative 10/18/2015 0   Final  . Eosinophils Absolute 10/18/2015 0.0  0 - 0.7 K/uL Final  . Basophils Relative 10/18/2015 0   Final  . Basophils Absolute 10/18/2015 0.0  0 - 0.1 K/uL Final  . Sodium 10/18/2015 135  135 - 145 mmol/L Final  . Potassium 10/18/2015 3.5  3.5 - 5.1 mmol/L Final  . Chloride 10/18/2015 103  101 - 111 mmol/L Final  . CO2 10/18/2015 25  22 - 32 mmol/L Final  . Glucose, Bld 10/18/2015 79  65 - 99 mg/dL Final  . BUN 12/16/2015 15  6 - 20 mg/dL Final  . Creatinine, Ser 10/18/2015 0.69  0.44 - 1.00 mg/dL Final  . Calcium 12/16/2015 8.9  8.9 - 10.3  mg/dL Final  . Total Protein 10/18/2015 7.5  6.5 - 8.1 g/dL Final  . Albumin 10/18/2015 4.5  3.5 - 5.0 g/dL Final  . AST 10/18/2015 21  15 - 41 U/L Final  . ALT 10/18/2015 24  14 - 54 U/L Final  . Alkaline Phosphatase 10/18/2015 85  38 - 126 U/L Final  . Total Bilirubin 10/18/2015 0.5  0.3 - 1.2 mg/dL Final  . GFR calc non Af Amer 10/18/2015 >60  >60 mL/min Final  . GFR calc Af Amer 10/18/2015 >60  >60 mL/min Final   Comment: (NOTE) The eGFR has been calculated using the CKD EPI equation. This calculation has not been validated in all clinical situations. eGFR's persistently <60 mL/min signify possible Chronic Kidney Disease.   . Anion gap 10/18/2015 7  5 - 15 Final    Assessment:  Emma Atkinson is a 50 y.o. female with clinical stage T2N1 (stage IIB) Her2/neu + right breast cancer status post biopsy.  She presented with a minimally painful right breast mass with nipple inversion.  Bilateral mammogram and ultrasound on 08/12/2015 revealed a 4.1 cm right superior breast mass containing pleomorphic microcalcifications. There were several satellite lesions (1.3 cm, 0.9 cm, 0.6 cm) plus a 1.3 cm mass versus complicated cyst and a 0.8 cm cyst.  There was one 5 mm right axillary node with irregular thickened cortex.   Left breast revealed a 1.5 cm cyst containing intramural nodule versus debris  at the 9:00 position 2 cm from the nipple. There was also an indeterminate left breast 7 mm nodule at the 2:00 position 4 cm from the nipple.  Left breast biopsy at the 9:00 position on 08/17/2015 revealed intraductal papilloma with fibrocystic changes.   Core needle biopsy on 08/17/2015 of the right breast mass revealed a grade 3 invasive mammary carcinoma. Right axillary biopsy confirmed metastatic mammary carcinoma. Tumor was ER positive (1-10%), PR positive (11-50%) and HER-2/neu 3+  PET scan on 08/26/2015 revealed hypermetabolic right breast mass (SUV 11.3) with a hypermetabolic right axillary lymph node and a subtle focus of very faint hypermetabolic activity laterally in the right breast. There was faint hypermetabolic activity at the biopsy site of the left breast (? biopsy related).  CA27.29 was 19.6 on 08/22/2015.  She is s/p partial hysterectomy.  She is premenopausal ([place] 5 and estradiol 271.9) on 08/22/2015.  Echo on 08/26/2015 revealed an ejection fraction of 55-60%.  She has a family history of breast and ovarian cancer.  My Risk genetic testing revealed no mutation on 08/22/2015.  She is status post 2 cycles of AC (09/13/2015 - 09/27/2015) with Neulasta support.  Cycle #1 was complicated by facial flushing secondary to Decadron and Emend.  She tolerated the remainder of the infusion with Benadryl and Pepcid premedication and slowing down the infusion.  She developed neutropenia (ANC 200).  She had 3 episodes of morning dizziness.  Cortisol level was low x 2 (seen by endocrinology).  Cycle #2 was notable for leukocytosis (23,100) on day 15.  Symptomatically, she feels great.  Exam is unremarkable.  Plan: 1.  Labs today:  CBC with diff, CMP. 2.  Cycle #3 AC tomorrow in [place].  On-Pro Neulasta. 3.  RTC in 2 weeks for MD assessment, labs (CBC with diff, CMP) and cycle #4 AC.    Lequita Asal, MD  10/18/2015

## 2015-11-01 ENCOUNTER — Inpatient Hospital Stay: Payer: 59

## 2015-11-01 ENCOUNTER — Inpatient Hospital Stay (HOSPITAL_BASED_OUTPATIENT_CLINIC_OR_DEPARTMENT_OTHER): Payer: 59 | Admitting: Hematology and Oncology

## 2015-11-01 VITALS — BP 133/80 | HR 94 | Temp 98.0°F | Resp 18 | Ht 61.0 in | Wt 109.3 lb

## 2015-11-01 DIAGNOSIS — C50911 Malignant neoplasm of unspecified site of right female breast: Secondary | ICD-10-CM

## 2015-11-01 DIAGNOSIS — Z809 Family history of malignant neoplasm, unspecified: Secondary | ICD-10-CM

## 2015-11-01 DIAGNOSIS — R531 Weakness: Secondary | ICD-10-CM

## 2015-11-01 DIAGNOSIS — R5383 Other fatigue: Secondary | ICD-10-CM | POA: Diagnosis not present

## 2015-11-01 DIAGNOSIS — T380X5A Adverse effect of glucocorticoids and synthetic analogues, initial encounter: Secondary | ICD-10-CM

## 2015-11-01 DIAGNOSIS — N63 Unspecified lump in breast: Secondary | ICD-10-CM

## 2015-11-01 DIAGNOSIS — Z8041 Family history of malignant neoplasm of ovary: Secondary | ICD-10-CM

## 2015-11-01 DIAGNOSIS — R42 Dizziness and giddiness: Secondary | ICD-10-CM

## 2015-11-01 DIAGNOSIS — C50111 Malignant neoplasm of central portion of right female breast: Secondary | ICD-10-CM

## 2015-11-01 DIAGNOSIS — Z5111 Encounter for antineoplastic chemotherapy: Secondary | ICD-10-CM | POA: Diagnosis not present

## 2015-11-01 DIAGNOSIS — D709 Neutropenia, unspecified: Secondary | ICD-10-CM

## 2015-11-01 DIAGNOSIS — F329 Major depressive disorder, single episode, unspecified: Secondary | ICD-10-CM

## 2015-11-01 DIAGNOSIS — Z170 Estrogen receptor positive status [ER+]: Secondary | ICD-10-CM

## 2015-11-01 DIAGNOSIS — Z7689 Persons encountering health services in other specified circumstances: Secondary | ICD-10-CM | POA: Diagnosis not present

## 2015-11-01 DIAGNOSIS — R109 Unspecified abdominal pain: Secondary | ICD-10-CM

## 2015-11-01 DIAGNOSIS — R110 Nausea: Secondary | ICD-10-CM

## 2015-11-01 DIAGNOSIS — D72829 Elevated white blood cell count, unspecified: Secondary | ICD-10-CM

## 2015-11-01 DIAGNOSIS — Z803 Family history of malignant neoplasm of breast: Secondary | ICD-10-CM

## 2015-11-01 DIAGNOSIS — Z87891 Personal history of nicotine dependence: Secondary | ICD-10-CM

## 2015-11-01 DIAGNOSIS — F419 Anxiety disorder, unspecified: Secondary | ICD-10-CM

## 2015-11-01 DIAGNOSIS — Z79899 Other long term (current) drug therapy: Secondary | ICD-10-CM

## 2015-11-01 DIAGNOSIS — C50811 Malignant neoplasm of overlapping sites of right female breast: Secondary | ICD-10-CM

## 2015-11-01 LAB — COMPREHENSIVE METABOLIC PANEL
ALT: 30 U/L (ref 14–54)
AST: 22 U/L (ref 15–41)
Albumin: 4.2 g/dL (ref 3.5–5.0)
Alkaline Phosphatase: 129 U/L — ABNORMAL HIGH (ref 38–126)
Anion gap: 7 (ref 5–15)
BUN: 9 mg/dL (ref 6–20)
CO2: 26 mmol/L (ref 22–32)
Calcium: 9.1 mg/dL (ref 8.9–10.3)
Chloride: 104 mmol/L (ref 101–111)
Creatinine, Ser: 0.58 mg/dL (ref 0.44–1.00)
GFR calc Af Amer: >60 mL/min (ref >60)
GFR calc non Af Amer: >60 mL/min (ref >60)
Glucose, Bld: 108 mg/dL — ABNORMAL HIGH (ref 65–99)
Potassium: 4 mmol/L (ref 3.5–5.1)
Sodium: 137 mmol/L (ref 135–145)
Total Bilirubin: 0.5 mg/dL (ref 0.3–1.2)
Total Protein: 7 g/dL (ref 6.5–8.1)

## 2015-11-01 LAB — CBC WITH DIFFERENTIAL/PLATELET
Basophils Absolute: 0.1 10*3/uL (ref 0–0.1)
Basophils Relative: 0 %
Eosinophils Absolute: 0.1 10*3/uL (ref 0–0.7)
Eosinophils Relative: 0 %
HCT: 33.4 % — ABNORMAL LOW (ref 35.0–47.0)
Hemoglobin: 11.2 g/dL — ABNORMAL LOW (ref 12.0–16.0)
Lymphocytes Relative: 6 %
Lymphs Abs: 1.5 10*3/uL (ref 1.0–3.6)
MCH: 30.9 pg (ref 26.0–34.0)
MCHC: 33.4 g/dL (ref 32.0–36.0)
MCV: 92.4 fL (ref 80.0–100.0)
Monocytes Absolute: 1.2 10*3/uL — ABNORMAL HIGH (ref 0.2–0.9)
Monocytes Relative: 5 %
Neutro Abs: 21.4 10*3/uL — ABNORMAL HIGH (ref 1.4–6.5)
Neutrophils Relative %: 89 %
Platelets: 243 10*3/uL (ref 150–440)
RBC: 3.61 MIL/uL — ABNORMAL LOW (ref 3.80–5.20)
RDW: 16.2 % — ABNORMAL HIGH (ref 11.5–14.5)
WBC: 24.2 10*3/uL — ABNORMAL HIGH (ref 3.6–11.0)

## 2015-11-01 NOTE — Progress Notes (Signed)
[facility] day:  11/01/2015   Chief Complaint: Emma Atkinson is a 50 y.o. female with clinical stage IIB Her2/neu+ right breast cancer who is seen for assessment prior to cycle #4 neoadjuvant adriamycin and Cytoxan (AC).  HPI:  The patient was last seen in the medical oncology clinic on 10/27/2015.  At that time, she was seen for sick call visit on day 9 status post cycle #3 neoadjuvant adriamycin and Cytoxan (AC).  She felt weak and fatigued.  Oral intake had decreased.  Weight had decreased by 5 pounds.  Exam was unremarkable.  During the interim, she has done well.  She has gained 3 pounds.  She is eating 1 big meal a day. She denies any nausea.  She denies any Neulasta achiness.  Past Medical History  Diagnosis Date  . Breast cancer ([place])   . Anxiety   . Depression   . Anemia     H/O  . Complication of anesthesia     HARD TO WAKE UP AFTER TONSILLECTOMY AS A CHILD BUT NO PROBLEMS SINCE    Past Surgical History  Procedure Laterality Date  . Breast biopsy Bilateral     08/17/2015   . Abdominal hysterectomy  2001  . Tonsillectomy    . Back surgery      FOR SCOLIOSIS-HERRINGTON RODS IN PLACE  . Portacath placement N/A 08/30/2015    Procedure: INSERTION PORT-A-CATH;  Surgeon: Leonie Green, MD;  Location: [facility] ORS;  Service: General;  Laterality: N/A;    Family History  Problem Relation Age of Onset  . Breast cancer Maternal Aunt 64  . Cancer Father   . Cancer Sister   . Cancer Maternal Grandfather     Social History:  reports that she quit smoking about 16 years ago. Her smoking use included Cigarettes. She has a 10 pack-year smoking history. She does not have any smokeless tobacco history on file. She reports that she drinks alcohol. She reports that she does not use illicit drugs.  The patient is accompanied by her husband today.  Allergies:  Allergies  Allergen Reactions  . Ceftin [Cefuroxime Axetil] Rash    Current  Medications: Current Outpatient Prescriptions  Medication Sig Dispense Refill  . ALPRAZolam (XANAX) 0.25 MG tablet Take 1/4 tablet up to three times daily as needed    . Dexlansoprazole (DEXILANT) 30 MG capsule Take 30 mg by mouth daily.    . diphenhydrAMINE (SOMINEX) 25 MG tablet Take 25 mg by mouth at bedtime.     . lidocaine-prilocaine (EMLA) cream Apply 1 application topically as needed. Apply at least 1-2 hours before the port is to be used. 30 g 1  . LORazepam (ATIVAN) 0.5 MG tablet Take 1 tablet (0.5 mg total) by mouth every 6 (six) hours as needed (Nausea or vomiting). 30 tablet 0  . Multiple Vitamin (MULTIVITAMIN) capsule Take 1 capsule by mouth daily.    . ondansetron (ZOFRAN) 8 MG tablet Take 1 tablet (8 mg total) by mouth 2 (two) times daily as needed. Start on the third day after chemotherapy. 30 tablet 1   No current facility-administered medications for this visit.    Review of Systems:  GENERAL:  Feels good.  No fevers or sweats.  Weight up 3 pounds. PERFORMANCE STATUS (ECOG):  1 HEENT:  No visual changes, runny nose, sore throat, mouth sores or tenderness. Lungs: No shortness of breath or cough.  No hemoptysis. Cardiac:  No chest pain, palpitations, orthopnea,  or PND. GI:  Eating better (1 big meal a day).  Mild nausea.  Taking Miralax.  No nausea, vomiting, diarrhea, constipation, melena or hematochezia. GU:  No urgency, frequency, dysuria, or hematuria. Musculoskeletal:  No back pain.  No joint pain.  No muscle tenderness. Extremities:  No pain or swelling. Skin:  No rashes or skin changes. Neuro:  No dizzy or lightheadedness.  No numbness or weakness, balance or coordination issues. Endocrine:  No diabetes, thyroid issues, hot flashes or night sweats. Psych:  No mood changes or depression. Pain:  No focal pain. Review of systems:  All other systems reviewed and found to be negative.  Physical Exam: Blood pressure 133/80, pulse 94, temperature 98 F (36.7 C),  temperature source Tympanic, resp. rate 18, height '5\' 1"'$  (1.549 m), weight 109 lb 5.6 oz (49.6 kg).  No orthostasis. GENERAL:  Thin woman sitting comfortably in the exam room in no acute distress.  MENTAL STATUS:  Alert and oriented to person, place and time. HEAD:  Alopecia.  Normocephalic, atraumatic, face symmetric (thin), no Cushingoid features. EYES:  Blue eyes.  Pupils equal round and reactive to light and accomodation.  No conjunctivitis or scleral icterus. ENT:  Oropharynx clear without lesion.  Tongue normal. Mucous membranes moist.  RESPIRATORY:  Clear to auscultation without rales, wheezes or rhonchi. CARDIOVASCULAR:  Regular rate and rhythm without murmur, rub or gallop. No JVD. ABDOMEN:  Soft, non-tender, with active bowel sounds, and no hepatosplenomegaly.  No masses. SKIN:  No rashes, ulcers or lesions. EXTREMITIES: No edema, no skin discoloration or tenderness.  No palpable cords. LYMPH NODES: No palpable cervical, supraclavicular, axillary or inguinal adenopathy  NEUROLOGICAL: Unremarkable. PSYCH:  Appropriate.   Appointment on 11/01/2015  Component Date Value Ref Range Status  . WBC 11/01/2015 24.2* 3.6 - 11.0 K/uL Final  . RBC 11/01/2015 3.61* 3.80 - 5.20 MIL/uL Final  . Hemoglobin 11/01/2015 11.2* 12.0 - 16.0 g/dL Final  . HCT 11/01/2015 33.4* 35.0 - 47.0 % Final  . MCV 11/01/2015 92.4  80.0 - 100.0 fL Final  . MCH 11/01/2015 30.9  26.0 - 34.0 pg Final  . MCHC 11/01/2015 33.4  32.0 - 36.0 g/dL Final  . RDW 11/01/2015 16.2* 11.5 - 14.5 % Final  . Platelets 11/01/2015 243  150 - 440 K/uL Final  . Neutrophils Relative % 11/01/2015 89   Final  . Neutro Abs 11/01/2015 21.4* 1.4 - 6.5 K/uL Final  . Lymphocytes Relative 11/01/2015 6   Final  . Lymphs Abs 11/01/2015 1.5  1.0 - 3.6 K/uL Final  . Monocytes Relative 11/01/2015 5   Final  . Monocytes Absolute 11/01/2015 1.2* 0.2 - 0.9 K/uL Final  . Eosinophils Relative 11/01/2015 0   Final  . Eosinophils Absolute 11/01/2015  0.1  0 - 0.7 K/uL Final  . Basophils Relative 11/01/2015 0   Final  . Basophils Absolute 11/01/2015 0.1  0 - 0.1 K/uL Final    Assessment:  Emma Atkinson is a 50 y.o. female with clinical stage T2N1 (stage IIB) Her2/neu + right breast cancer status post biopsy.  She presented with a minimally painful right breast mass with nipple inversion.  Bilateral mammogram and ultrasound on 08/12/2015 revealed a 4.1 cm right superior breast mass containing pleomorphic microcalcifications. There were several satellite lesions (1.3 cm, 0.9 cm, 0.6 cm) plus a 1.3 cm mass versus complicated cyst and a 0.8 cm cyst.  There was one 5 mm right axillary node with irregular thickened cortex.   Left breast revealed  a 1.5 cm cyst containing intramural nodule versus debris at the 9:00 position 2 cm from the nipple. There was also an indeterminate left breast 7 mm nodule at the 2:00 position 4 cm from the nipple.  Left breast biopsy at the 9:00 position on 08/17/2015 revealed intraductal papilloma with fibrocystic changes.   Core needle biopsy on 08/17/2015 of the right breast mass revealed a grade 3 invasive mammary carcinoma. Right axillary biopsy confirmed metastatic mammary carcinoma. Tumor was ER positive (1-10%), PR positive (11-50%) and HER-2/neu 3+  PET scan on 08/26/2015 revealed hypermetabolic right breast mass (SUV 11.3) with a hypermetabolic right axillary lymph node and a subtle focus of very faint hypermetabolic activity laterally in the right breast. There was faint hypermetabolic activity at the biopsy site of the left breast (? biopsy related).  CA27.29 was 19.6 on 08/22/2015.  She is s/p partial hysterectomy.  She is premenopausal ([place] 5 and estradiol 271.9) on 08/22/2015.  Echo on 08/26/2015 revealed an ejection fraction of 55-60%.  She has a family history of breast and ovarian cancer.  My Risk genetic testing revealed no mutation on 08/22/2015.  She is currently day 15 status post cycle #3 AC (09/13/2015  - 10/19/2015) with Neulasta support.  Cycle #1 was complicated by facial flushing secondary to Decadron and Emend.  She tolerated the remainder of the infusion with Benadryl and Pepcid premedication and slowing down the infusion.  She developed neutropenia (ANC 200).  She had 3 episodes of dizziness in the morning.  Cortisol level was low x 2 (seen by endocrinology). Cycle #2 and #3 were notable for leukocytosis (23,100 - 24,200) on day 15.  Symptomatically, she feels good. She is eating better.  Weight has increased by 3 pounds.  Exam is unremarkable.  Plan: 1.  Labs today:  CBC with diff, CMP. 2.  Discuss leukocytosis likely due to residual Neulasta effect.  Discuss postponing cycle #4 for 1 week. 3.  No treatment tomorrow. 4.  RTC on 11/08/2015 for MD assessment, labs (CBC with diff, CMP), and cycle #4 AC.   Lequita Asal, MD  11/01/2015, 9:45 AM

## 2015-11-02 ENCOUNTER — Other Ambulatory Visit: Payer: Self-pay | Admitting: Hematology and Oncology

## 2015-11-02 ENCOUNTER — Inpatient Hospital Stay: Payer: 59

## 2015-11-07 ENCOUNTER — Encounter: Payer: Self-pay | Admitting: Hematology and Oncology

## 2015-11-07 ENCOUNTER — Other Ambulatory Visit: Payer: Self-pay | Admitting: Hematology and Oncology

## 2015-11-08 ENCOUNTER — Inpatient Hospital Stay: Payer: 59

## 2015-11-08 ENCOUNTER — Inpatient Hospital Stay (HOSPITAL_BASED_OUTPATIENT_CLINIC_OR_DEPARTMENT_OTHER): Payer: 59 | Admitting: Hematology and Oncology

## 2015-11-08 ENCOUNTER — Other Ambulatory Visit: Payer: Self-pay | Admitting: Hematology and Oncology

## 2015-11-08 VITALS — BP 131/84 | HR 98 | Temp 99.3°F | Resp 18 | Ht 61.0 in | Wt 109.9 lb

## 2015-11-08 DIAGNOSIS — C50911 Malignant neoplasm of unspecified site of right female breast: Secondary | ICD-10-CM | POA: Diagnosis not present

## 2015-11-08 DIAGNOSIS — C50111 Malignant neoplasm of central portion of right female breast: Secondary | ICD-10-CM

## 2015-11-08 DIAGNOSIS — R5383 Other fatigue: Secondary | ICD-10-CM

## 2015-11-08 DIAGNOSIS — F329 Major depressive disorder, single episode, unspecified: Secondary | ICD-10-CM

## 2015-11-08 DIAGNOSIS — Z170 Estrogen receptor positive status [ER+]: Secondary | ICD-10-CM | POA: Diagnosis not present

## 2015-11-08 DIAGNOSIS — R110 Nausea: Secondary | ICD-10-CM

## 2015-11-08 DIAGNOSIS — T380X5A Adverse effect of glucocorticoids and synthetic analogues, initial encounter: Secondary | ICD-10-CM

## 2015-11-08 DIAGNOSIS — F419 Anxiety disorder, unspecified: Secondary | ICD-10-CM

## 2015-11-08 DIAGNOSIS — N63 Unspecified lump in breast: Secondary | ICD-10-CM

## 2015-11-08 DIAGNOSIS — Z7689 Persons encountering health services in other specified circumstances: Secondary | ICD-10-CM

## 2015-11-08 DIAGNOSIS — D709 Neutropenia, unspecified: Secondary | ICD-10-CM | POA: Diagnosis not present

## 2015-11-08 DIAGNOSIS — R531 Weakness: Secondary | ICD-10-CM

## 2015-11-08 DIAGNOSIS — R42 Dizziness and giddiness: Secondary | ICD-10-CM

## 2015-11-08 DIAGNOSIS — R109 Unspecified abdominal pain: Secondary | ICD-10-CM

## 2015-11-08 DIAGNOSIS — Z5111 Encounter for antineoplastic chemotherapy: Secondary | ICD-10-CM | POA: Diagnosis not present

## 2015-11-08 DIAGNOSIS — Z803 Family history of malignant neoplasm of breast: Secondary | ICD-10-CM

## 2015-11-08 DIAGNOSIS — Z809 Family history of malignant neoplasm, unspecified: Secondary | ICD-10-CM

## 2015-11-08 DIAGNOSIS — D72829 Elevated white blood cell count, unspecified: Secondary | ICD-10-CM

## 2015-11-08 DIAGNOSIS — Z8041 Family history of malignant neoplasm of ovary: Secondary | ICD-10-CM

## 2015-11-08 DIAGNOSIS — Z79899 Other long term (current) drug therapy: Secondary | ICD-10-CM

## 2015-11-08 DIAGNOSIS — Z87891 Personal history of nicotine dependence: Secondary | ICD-10-CM

## 2015-11-08 LAB — CBC WITH DIFFERENTIAL/PLATELET
Basophils Absolute: 0 10*3/uL (ref 0–0.1)
Basophils Relative: 1 %
Eosinophils Absolute: 0 10*3/uL (ref 0–0.7)
Eosinophils Relative: 0 %
HCT: 30.7 % — ABNORMAL LOW (ref 35.0–47.0)
Hemoglobin: 10.4 g/dL — ABNORMAL LOW (ref 12.0–16.0)
Lymphocytes Relative: 10 %
Lymphs Abs: 0.8 10*3/uL — ABNORMAL LOW (ref 1.0–3.6)
MCH: 31.7 pg (ref 26.0–34.0)
MCHC: 33.9 g/dL (ref 32.0–36.0)
MCV: 93.5 fL (ref 80.0–100.0)
Monocytes Absolute: 0.8 10*3/uL (ref 0.2–0.9)
Monocytes Relative: 9 %
Neutro Abs: 7 10*3/uL — ABNORMAL HIGH (ref 1.4–6.5)
Neutrophils Relative %: 80 %
Platelets: 359 10*3/uL (ref 150–440)
RBC: 3.28 MIL/uL — ABNORMAL LOW (ref 3.80–5.20)
RDW: 17.2 % — ABNORMAL HIGH (ref 11.5–14.5)
WBC: 8.7 10*3/uL (ref 3.6–11.0)

## 2015-11-08 LAB — COMPREHENSIVE METABOLIC PANEL
ALT: 21 U/L (ref 14–54)
AST: 18 U/L (ref 15–41)
Albumin: 4.2 g/dL (ref 3.5–5.0)
Alkaline Phosphatase: 77 U/L (ref 38–126)
Anion gap: 7 (ref 5–15)
BUN: 12 mg/dL (ref 6–20)
CO2: 25 mmol/L (ref 22–32)
Calcium: 8.9 mg/dL (ref 8.9–10.3)
Chloride: 105 mmol/L (ref 101–111)
Creatinine, Ser: 0.57 mg/dL (ref 0.44–1.00)
GFR calc Af Amer: >60 mL/min (ref >60)
GFR calc non Af Amer: >60 mL/min (ref >60)
Glucose, Bld: 95 mg/dL (ref 65–99)
Potassium: 3.7 mmol/L (ref 3.5–5.1)
Sodium: 137 mmol/L (ref 135–145)
Total Bilirubin: 0.5 mg/dL (ref 0.3–1.2)
Total Protein: 6.8 g/dL (ref 6.5–8.1)

## 2015-11-08 LAB — MAGNESIUM: Magnesium: 2.1 mg/dL (ref 1.7–2.4)

## 2015-11-08 MED ORDER — FAMOTIDINE IN NACL 20-0.9 MG/50ML-% IV SOLN
20.0000 mg | Freq: Two times a day (BID) | INTRAVENOUS | Status: DC
  Administered 2015-11-08: 20 mg via INTRAVENOUS
  Filled 2015-11-08: qty 50

## 2015-11-08 MED ORDER — PALONOSETRON HCL INJECTION 0.25 MG/5ML
0.2500 mg | Freq: Once | INTRAVENOUS | Status: AC
  Administered 2015-11-08: 0.25 mg via INTRAVENOUS
  Filled 2015-11-08: qty 5

## 2015-11-08 MED ORDER — DIPHENHYDRAMINE HCL 50 MG/ML IJ SOLN
25.0000 mg | Freq: Once | INTRAMUSCULAR | Status: AC
  Administered 2015-11-08: 25 mg via INTRAVENOUS
  Filled 2015-11-08: qty 1

## 2015-11-08 MED ORDER — HEPARIN SOD (PORK) LOCK FLUSH 100 UNIT/ML IV SOLN
500.0000 [IU] | Freq: Once | INTRAVENOUS | Status: AC
  Administered 2015-11-08: 500 [IU] via INTRAVENOUS
  Filled 2015-11-08: qty 5

## 2015-11-08 MED ORDER — SODIUM CHLORIDE 0.9 % IV SOLN
600.0000 mg/m2 | Freq: Once | INTRAVENOUS | Status: AC
  Administered 2015-11-08: 880 mg via INTRAVENOUS
  Filled 2015-11-08: qty 44

## 2015-11-08 MED ORDER — PEGFILGRASTIM 6 MG/0.6ML ~~LOC~~ PSKT
6.0000 mg | PREFILLED_SYRINGE | Freq: Once | SUBCUTANEOUS | Status: AC
Start: 2015-11-08 — End: 2015-11-08
  Administered 2015-11-08: 6 mg via SUBCUTANEOUS
  Filled 2015-11-08: qty 0.6

## 2015-11-08 MED ORDER — SODIUM CHLORIDE 0.9 % IJ SOLN
10.0000 mL | Freq: Once | INTRAMUSCULAR | Status: AC
  Administered 2015-11-08: 10 mL via INTRAVENOUS
  Filled 2015-11-08: qty 10

## 2015-11-08 MED ORDER — DOXORUBICIN HCL CHEMO IV INJECTION 2 MG/ML
60.0000 mg/m2 | Freq: Once | INTRAVENOUS | Status: AC
  Administered 2015-11-08: 88 mg via INTRAVENOUS
  Filled 2015-11-08: qty 44

## 2015-11-08 MED ORDER — SODIUM CHLORIDE 0.9 % IV SOLN
Freq: Once | INTRAVENOUS | Status: AC
  Administered 2015-11-08: 15:00:00 via INTRAVENOUS
  Filled 2015-11-08: qty 1000

## 2015-11-08 MED ORDER — SODIUM CHLORIDE 0.9 % IV SOLN
Freq: Once | INTRAVENOUS | Status: AC
  Administered 2015-11-08: 16:00:00 via INTRAVENOUS
  Filled 2015-11-08: qty 5

## 2015-11-08 NOTE — Progress Notes (Signed)
[facility] day:  11/08/2015   Chief Complaint: Emma Atkinson is a 50 y.o. female with clinical stage IIB Her2/neu+ right breast cancer who is seen for assessment prior to cycle #4 neoadjuvant adriamycin and Cytoxan (AC).  HPI:  The patient was last seen in the medical oncology clinic on 11/01/2015.  At that time, she was doing well.  Decision was made to postpone chemotherapy secondary to leukocytosis due to residual Neulasta effect.  During the interim, she has done well.  Weight is stable.  She notes that her appetite is "not great".  She denies any nausea or vomiting.  She notes some fatigue.   Past Medical History  Diagnosis Date  . Breast cancer ([place])   . Anxiety   . Depression   . Anemia     H/O  . Complication of anesthesia     HARD TO WAKE UP AFTER TONSILLECTOMY AS A CHILD BUT NO PROBLEMS SINCE    Past Surgical History  Procedure Laterality Date  . Breast biopsy Bilateral     08/17/2015   . Abdominal hysterectomy  2001  . Tonsillectomy    . Back surgery      FOR SCOLIOSIS-HERRINGTON RODS IN PLACE  . Portacath placement N/A 08/30/2015    Procedure: INSERTION PORT-A-CATH;  Surgeon: Leonie Green, MD;  Location: [facility] ORS;  Service: General;  Laterality: N/A;    Family History  Problem Relation Age of Onset  . Breast cancer Maternal Aunt 64  . Cancer Father   . Cancer Sister   . Cancer Maternal Grandfather     Social History:  reports that she quit smoking about 16 years ago. Her smoking use included Cigarettes. She has a 10 pack-year smoking history. She does not have any smokeless tobacco history on file. She reports that she drinks alcohol. She reports that she does not use illicit drugs.  The patient is accompanied by her husband today.  Allergies:  Allergies  Allergen Reactions  . Ceftin [Cefuroxime Axetil] Rash    Current Medications: Current Outpatient Prescriptions  Medication Sig Dispense Refill  .  ALPRAZolam (XANAX) 0.25 MG tablet Take 1/4 tablet up to three times daily as needed    . Dexlansoprazole (DEXILANT) 30 MG capsule Take 30 mg by mouth daily.    . diphenhydrAMINE (SOMINEX) 25 MG tablet Take 25 mg by mouth at bedtime.     . lidocaine-prilocaine (EMLA) cream Apply 1 application topically as needed. Apply at least 1-2 hours before the port is to be used. 30 g 1  . LORazepam (ATIVAN) 0.5 MG tablet Take 1 tablet (0.5 mg total) by mouth every 6 (six) hours as needed (Nausea or vomiting). 30 tablet 0  . Multiple Vitamin (MULTIVITAMIN) capsule Take 1 capsule by mouth daily.    . ondansetron (ZOFRAN) 8 MG tablet Take 1 tablet (8 mg total) by mouth 2 (two) times daily as needed. Start on the third day after chemotherapy. 30 tablet 1   No current facility-administered medications for this visit.   Facility-Administered Medications Ordered in Other Visits  Medication Dose Route Frequency Provider Last Rate Last Dose  . heparin lock flush 100 unit/mL  500 Units Intravenous Once Lequita Asal, MD      . sodium chloride 0.9 % injection 10 mL  10 mL Intravenous Once Lequita Asal, MD        Review of Systems:  GENERAL:  Feels good.  Fatigue.  No fevers or  sweats.  Weight stable. PERFORMANCE STATUS (ECOG):  1 HEENT:  No visual changes, runny nose, sore throat, mouth sores or tenderness. Lungs: No shortness of breath or cough.  No hemoptysis. Cardiac:  No chest pain, palpitations, orthopnea, or PND. GI:  Appetite modest.  No nausea, vomiting, diarrhea, constipation, melena or hematochezia. GU:  No urgency, frequency, dysuria, or hematuria. Musculoskeletal:  No back pain.  No joint pain.  No muscle tenderness. Extremities:  No pain or swelling. Skin:  No rashes or skin changes. Neuro:  No dizzy or lightheadedness.  No numbness or weakness, balance or coordination issues. Endocrine:  No diabetes, thyroid issues, hot flashes or night sweats. Psych:  No mood changes or  depression. Pain:  No focal pain. Review of systems:  All other systems reviewed and found to be negative.  Physical Exam: Blood pressure 131/84, pulse 98, temperature 99.3 F (37.4 C), temperature source Tympanic, resp. rate 18, height '5\' 1"'  (1.549 m), weight 109 lb 14.4 oz (49.85 kg).  No orthostasis. GENERAL:  Thin woman sitting comfortably in the exam room in no acute distress.  MENTAL STATUS:  Alert and oriented to person, place and time. HEAD:  Alopecia.  Normocephalic, atraumatic, face symmetric (thin), no Cushingoid features. EYES:  Blue eyes.  Pupils equal round and reactive to light and accomodation.  No conjunctivitis or scleral icterus. ENT:  Oropharynx clear without lesion.  Tongue normal. Mucous membranes moist.  RESPIRATORY:  Clear to auscultation without rales, wheezes or rhonchi. CARDIOVASCULAR:  Regular rate and rhythm without murmur, rub or gallop. No JVD. ABDOMEN:  Soft, non-tender, with active bowel sounds, and no hepatosplenomegaly.  No masses. SKIN:  No rashes, ulcers or lesions. EXTREMITIES: No edema, no skin discoloration or tenderness.  No palpable cords. LYMPH NODES: No palpable cervical, supraclavicular, axillary or inguinal adenopathy  NEUROLOGICAL: Unremarkable. PSYCH:  Appropriate.   Infusion on 11/08/2015  Component Date Value Ref Range Status  . Sodium 11/08/2015 137  135 - 145 mmol/L Final  . Potassium 11/08/2015 3.7  3.5 - 5.1 mmol/L Final  . Chloride 11/08/2015 105  101 - 111 mmol/L Final  . CO2 11/08/2015 25  22 - 32 mmol/L Final  . Glucose, Bld 11/08/2015 95  65 - 99 mg/dL Final  . BUN 11/08/2015 12  6 - 20 mg/dL Final  . Creatinine, Ser 11/08/2015 0.57  0.44 - 1.00 mg/dL Final  . Calcium 11/08/2015 8.9  8.9 - 10.3 mg/dL Final  . Total Protein 11/08/2015 6.8  6.5 - 8.1 g/dL Final  . Albumin 11/08/2015 4.2  3.5 - 5.0 g/dL Final  . AST 11/08/2015 18  15 - 41 U/L Final  . ALT 11/08/2015 21  14 - 54 U/L Final  . Alkaline Phosphatase 11/08/2015 77   38 - 126 U/L Final  . Total Bilirubin 11/08/2015 0.5  0.3 - 1.2 mg/dL Final  . GFR calc non Af Amer 11/08/2015 >60  >60 mL/min Final  . GFR calc Af Amer 11/08/2015 >60  >60 mL/min Final   Comment: (NOTE) The eGFR has been calculated using the CKD EPI equation. This calculation has not been validated in all clinical situations. eGFR's persistently <60 mL/min signify possible Chronic Kidney Disease.   . Anion gap 11/08/2015 7  5 - 15 Final  . WBC 11/08/2015 8.7  3.6 - 11.0 K/uL Final  . RBC 11/08/2015 3.28* 3.80 - 5.20 MIL/uL Final  . Hemoglobin 11/08/2015 10.4* 12.0 - 16.0 g/dL Final  . HCT 11/08/2015 30.7* 35.0 - 47.0 % Final  .  MCV 11/08/2015 93.5  80.0 - 100.0 fL Final  . MCH 11/08/2015 31.7  26.0 - 34.0 pg Final  . MCHC 11/08/2015 33.9  32.0 - 36.0 g/dL Final  . RDW 11/08/2015 17.2* 11.5 - 14.5 % Final  . Platelets 11/08/2015 359  150 - 440 K/uL Final  . Neutrophils Relative % 11/08/2015 80   Final  . Neutro Abs 11/08/2015 7.0* 1.4 - 6.5 K/uL Final  . Lymphocytes Relative 11/08/2015 10   Final  . Lymphs Abs 11/08/2015 0.8* 1.0 - 3.6 K/uL Final  . Monocytes Relative 11/08/2015 9   Final  . Monocytes Absolute 11/08/2015 0.8  0.2 - 0.9 K/uL Final  . Eosinophils Relative 11/08/2015 0   Final  . Eosinophils Absolute 11/08/2015 0.0  0 - 0.7 K/uL Final  . Basophils Relative 11/08/2015 1   Final  . Basophils Absolute 11/08/2015 0.0  0 - 0.1 K/uL Final    Assessment:  Emma Atkinson is a 50 y.o. female with clinical stage T2N1 (stage IIB) Her2/neu + right breast cancer status post biopsy.  She presented with a minimally painful right breast mass with nipple inversion.  Bilateral mammogram and ultrasound on 08/12/2015 revealed a 4.1 cm right superior breast mass containing pleomorphic microcalcifications. There were several satellite lesions (1.3 cm, 0.9 cm, 0.6 cm) plus a 1.3 cm mass versus complicated cyst and a 0.8 cm cyst.  There was one 5 mm right axillary node with irregular thickened  cortex.   Left breast revealed a 1.5 cm cyst containing intramural nodule versus debris at the 9:00 position 2 cm from the nipple. There was also an indeterminate left breast 7 mm nodule at the 2:00 position 4 cm from the nipple.  Left breast biopsy at the 9:00 position on 08/17/2015 revealed intraductal papilloma with fibrocystic changes.   Core needle biopsy on 08/17/2015 of the right breast mass revealed a grade 3 invasive mammary carcinoma. Right axillary biopsy confirmed metastatic mammary carcinoma. Tumor was ER positive (1-10%), PR positive (11-50%) and HER-2/neu 3+  PET scan on 08/26/2015 revealed hypermetabolic right breast mass (SUV 11.3) with a hypermetabolic right axillary lymph node and a subtle focus of very faint hypermetabolic activity laterally in the right breast. There was faint hypermetabolic activity at the biopsy site of the left breast (? biopsy related).  CA27.29 was 19.6 on 08/22/2015.  She is s/p partial hysterectomy.  She is premenopausal ([place] 5 and estradiol 271.9) on 08/22/2015.  Echo on 08/26/2015 revealed an ejection fraction of 55-60%.  She has a family history of breast and ovarian cancer.  My Risk genetic testing revealed no mutation on 08/22/2015.  She is status post 3 cycles of AC (09/13/2015 - 10/19/2015) with Neulasta support.  Cycle #1 was complicated by facial flushing secondary to Decadron and Emend.  She tolerated the remainder of the infusion with Benadryl and Pepcid premedication and slowing down the infusion.  She developed neutropenia (ANC 200).  She had 3 episodes of dizziness in the morning.  Cortisol level was low x 2 (seen by endocrinology). Cycle #2 and #3 were notable for leukocytosis (23,100 - 24,200) on day 15.  Symptomatically, she is fatigued.  Appetite is modest.  Weight is stable.  Exam is unremarkable.  Plan: 1.  Labs today:  CBC with diff, CMP. 2.  Cycle #4 AC today.  On-Pro Neulasta. 3.  Schedule follow-up breast ultrasound and echo in 2  1/2 weeks. 4.  Preauth Taxol, Herceptin, and Perjeta.  Information on medications today. 5.  Discuss diet.  Discuss time off from work.  Complete paperwork. 6.  RTC in 3 weeks for MD assessment, labs (CBC with diff, CMP, Mg), review of interval imaging, and week #1 Taxol with Herceptin and Perjeta.   Lequita Asal, MD  11/08/2015, 3:01 PM

## 2015-11-13 ENCOUNTER — Encounter: Payer: Self-pay | Admitting: Hematology and Oncology

## 2015-11-22 ENCOUNTER — Encounter: Payer: Self-pay | Admitting: *Deleted

## 2015-11-22 NOTE — Progress Notes (Signed)
  Oncology Nurse Navigator Documentation  Navigator Location: [facility] (11/22/15 1100) Navigator Encounter Type: Other (Card) (11/22/15 1100)           Patient Visit Type: Other (11/22/15 1100)    Thinking of you card mailed to patient.                          Time Spent with Patient: 15 (11/22/15 1100)

## 2015-11-25 ENCOUNTER — Ambulatory Visit
Admission: RE | Admit: 2015-11-25 | Discharge: 2015-11-25 | Disposition: A | Discharge status: Home / Self Care | Payer: 59 | Source: Ambulatory Visit | Attending: Hematology and Oncology | Admitting: Hematology and Oncology

## 2015-11-25 ENCOUNTER — Other Ambulatory Visit: Payer: Self-pay | Admitting: Hematology and Oncology

## 2015-11-25 ENCOUNTER — Ambulatory Visit (HOSPITAL_BASED_OUTPATIENT_CLINIC_OR_DEPARTMENT_OTHER)
Admission: RE | Admit: 2015-11-25 | Discharge: 2015-11-25 | Disposition: A | Discharge status: Home / Self Care | Payer: 59 | Source: Ambulatory Visit | Attending: Hematology and Oncology | Admitting: Hematology and Oncology

## 2015-11-25 ENCOUNTER — Ambulatory Visit: Payer: 59

## 2015-11-25 DIAGNOSIS — R921 Mammographic calcification found on diagnostic imaging of breast: Secondary | ICD-10-CM | POA: Diagnosis not present

## 2015-11-25 DIAGNOSIS — C50111 Malignant neoplasm of central portion of right female breast: Secondary | ICD-10-CM | POA: Insufficient documentation

## 2015-11-25 DIAGNOSIS — N63 Unspecified lump in breast: Secondary | ICD-10-CM | POA: Diagnosis not present

## 2015-11-25 DIAGNOSIS — C50912 Malignant neoplasm of unspecified site of left female breast: Secondary | ICD-10-CM | POA: Diagnosis not present

## 2015-11-25 NOTE — Progress Notes (Signed)
*  PRELIMINARY RESULTS* Echocardiogram 2D Echocardiogram has been performed.  Emma Atkinson 11/25/2015, 12:25 PM

## 2015-11-27 ENCOUNTER — Other Ambulatory Visit: Payer: Self-pay | Admitting: Hematology and Oncology

## 2015-11-29 ENCOUNTER — Inpatient Hospital Stay: Payer: 59 | Attending: Hematology and Oncology

## 2015-11-29 ENCOUNTER — Encounter: Payer: Self-pay | Admitting: Hematology and Oncology

## 2015-11-29 ENCOUNTER — Inpatient Hospital Stay (HOSPITAL_BASED_OUTPATIENT_CLINIC_OR_DEPARTMENT_OTHER): Payer: 59 | Admitting: Hematology and Oncology

## 2015-11-29 ENCOUNTER — Inpatient Hospital Stay: Payer: 59

## 2015-11-29 ENCOUNTER — Other Ambulatory Visit: Payer: Self-pay | Admitting: Hematology and Oncology

## 2015-11-29 VITALS — BP 122/72 | HR 98 | Temp 98.0°F | Resp 20

## 2015-11-29 VITALS — BP 123/78 | HR 102 | Temp 98.7°F | Wt 108.9 lb

## 2015-11-29 DIAGNOSIS — Z862 Personal history of diseases of the blood and blood-forming organs and certain disorders involving the immune mechanism: Secondary | ICD-10-CM | POA: Insufficient documentation

## 2015-11-29 DIAGNOSIS — R5383 Other fatigue: Secondary | ICD-10-CM | POA: Insufficient documentation

## 2015-11-29 DIAGNOSIS — Z803 Family history of malignant neoplasm of breast: Secondary | ICD-10-CM

## 2015-11-29 DIAGNOSIS — Z79899 Other long term (current) drug therapy: Secondary | ICD-10-CM | POA: Insufficient documentation

## 2015-11-29 DIAGNOSIS — Z170 Estrogen receptor positive status [ER+]: Secondary | ICD-10-CM | POA: Insufficient documentation

## 2015-11-29 DIAGNOSIS — Z5112 Encounter for antineoplastic immunotherapy: Secondary | ICD-10-CM | POA: Diagnosis not present

## 2015-11-29 DIAGNOSIS — F418 Other specified anxiety disorders: Secondary | ICD-10-CM

## 2015-11-29 DIAGNOSIS — G629 Polyneuropathy, unspecified: Secondary | ICD-10-CM | POA: Diagnosis not present

## 2015-11-29 DIAGNOSIS — C50811 Malignant neoplasm of overlapping sites of right female breast: Secondary | ICD-10-CM

## 2015-11-29 DIAGNOSIS — Z8041 Family history of malignant neoplasm of ovary: Secondary | ICD-10-CM

## 2015-11-29 DIAGNOSIS — C773 Secondary and unspecified malignant neoplasm of axilla and upper limb lymph nodes: Secondary | ICD-10-CM

## 2015-11-29 DIAGNOSIS — C50111 Malignant neoplasm of central portion of right female breast: Secondary | ICD-10-CM | POA: Diagnosis not present

## 2015-11-29 DIAGNOSIS — Z87891 Personal history of nicotine dependence: Secondary | ICD-10-CM | POA: Insufficient documentation

## 2015-11-29 DIAGNOSIS — Z809 Family history of malignant neoplasm, unspecified: Secondary | ICD-10-CM | POA: Insufficient documentation

## 2015-11-29 DIAGNOSIS — D242 Benign neoplasm of left breast: Secondary | ICD-10-CM

## 2015-11-29 DIAGNOSIS — L989 Disorder of the skin and subcutaneous tissue, unspecified: Secondary | ICD-10-CM | POA: Insufficient documentation

## 2015-11-29 LAB — COMPREHENSIVE METABOLIC PANEL
ALT: 16 U/L (ref 14–54)
AST: 15 U/L (ref 15–41)
Albumin: 4.1 g/dL (ref 3.5–5.0)
Alkaline Phosphatase: 76 U/L (ref 38–126)
Anion gap: 7 (ref 5–15)
BUN: 13 mg/dL (ref 6–20)
CO2: 25 mmol/L (ref 22–32)
Calcium: 8.8 mg/dL — ABNORMAL LOW (ref 8.9–10.3)
Chloride: 105 mmol/L (ref 101–111)
Creatinine, Ser: 0.48 mg/dL (ref 0.44–1.00)
GFR calc Af Amer: >60 mL/min (ref >60)
GFR calc non Af Amer: >60 mL/min (ref >60)
Glucose, Bld: 115 mg/dL — ABNORMAL HIGH (ref 65–99)
Potassium: 3.5 mmol/L (ref 3.5–5.1)
Sodium: 137 mmol/L (ref 135–145)
Total Bilirubin: 0.7 mg/dL (ref 0.3–1.2)
Total Protein: 6.9 g/dL (ref 6.5–8.1)

## 2015-11-29 LAB — CBC WITH DIFFERENTIAL/PLATELET
Basophils Absolute: 0 10*3/uL (ref 0–0.1)
Basophils Relative: 0 %
Eosinophils Absolute: 0 10*3/uL (ref 0–0.7)
Eosinophils Relative: 0 %
HCT: 30.6 % — ABNORMAL LOW (ref 35.0–47.0)
Hemoglobin: 10.5 g/dL — ABNORMAL LOW (ref 12.0–16.0)
Lymphocytes Relative: 6 %
Lymphs Abs: 0.6 10*3/uL — ABNORMAL LOW (ref 1.0–3.6)
MCH: 32.7 pg (ref 26.0–34.0)
MCHC: 34.2 g/dL (ref 32.0–36.0)
MCV: 95.7 fL (ref 80.0–100.0)
Monocytes Absolute: 1 10*3/uL — ABNORMAL HIGH (ref 0.2–0.9)
Monocytes Relative: 9 %
Neutro Abs: 9.5 10*3/uL — ABNORMAL HIGH (ref 1.4–6.5)
Neutrophils Relative %: 85 %
Platelets: 352 10*3/uL (ref 150–440)
RBC: 3.2 MIL/uL — ABNORMAL LOW (ref 3.80–5.20)
RDW: 17.1 % — ABNORMAL HIGH (ref 11.5–14.5)
WBC: 11.2 10*3/uL — ABNORMAL HIGH (ref 3.6–11.0)

## 2015-11-29 LAB — MAGNESIUM: Magnesium: 2 mg/dL (ref 1.7–2.4)

## 2015-11-29 MED ORDER — TRASTUZUMAB CHEMO INJECTION 440 MG
8.0000 mg/kg | Freq: Once | INTRAVENOUS | Status: AC
  Administered 2015-11-29: 399 mg via INTRAVENOUS
  Filled 2015-11-29: qty 19

## 2015-11-29 MED ORDER — HEPARIN SOD (PORK) LOCK FLUSH 100 UNIT/ML IV SOLN
500.0000 [IU] | Freq: Once | INTRAVENOUS | Status: AC | PRN
  Administered 2015-11-29: 500 [IU]
  Filled 2015-11-29: qty 5

## 2015-11-29 MED ORDER — SODIUM CHLORIDE 0.9 % IV SOLN
20.0000 mg | Freq: Once | INTRAVENOUS | Status: AC
  Administered 2015-11-29: 20 mg via INTRAVENOUS
  Filled 2015-11-29: qty 2

## 2015-11-29 MED ORDER — SODIUM CHLORIDE 0.9 % IV SOLN
840.0000 mg | Freq: Once | INTRAVENOUS | Status: AC
  Administered 2015-11-29: 840 mg via INTRAVENOUS
  Filled 2015-11-29: qty 28

## 2015-11-29 MED ORDER — FAMOTIDINE IN NACL 20-0.9 MG/50ML-% IV SOLN
20.0000 mg | Freq: Once | INTRAVENOUS | Status: AC
  Administered 2015-11-29: 20 mg via INTRAVENOUS
  Filled 2015-11-29: qty 50

## 2015-11-29 MED ORDER — DIPHENHYDRAMINE HCL 25 MG PO CAPS: 50.0000 mg | ORAL_CAPSULE | Freq: Once | ORAL | Status: DC

## 2015-11-29 MED ORDER — PACLITAXEL CHEMO INJECTION 300 MG/50ML
80.0000 mg/m2 | Freq: Once | INTRAVENOUS | Status: AC
  Administered 2015-11-29: 120 mg via INTRAVENOUS
  Filled 2015-11-29: qty 20

## 2015-11-29 MED ORDER — DIPHENHYDRAMINE HCL 50 MG/ML IJ SOLN
50.0000 mg | Freq: Once | INTRAMUSCULAR | Status: AC
  Administered 2015-11-29: 50 mg via INTRAVENOUS
  Filled 2015-11-29: qty 1

## 2015-11-29 MED ORDER — ACETAMINOPHEN 325 MG PO TABS
650.0000 mg | ORAL_TABLET | Freq: Once | ORAL | Status: AC
  Administered 2015-11-29: 650 mg via ORAL
  Filled 2015-11-29: qty 2

## 2015-11-29 MED ORDER — SODIUM CHLORIDE 0.9 % IV SOLN
Freq: Once | INTRAVENOUS | Status: AC
  Administered 2015-11-29: 10:00:00 via INTRAVENOUS
  Filled 2015-11-29: qty 1000

## 2015-11-29 NOTE — Progress Notes (Signed)
[facility] day:  11/29/2015   Chief Complaint: Emma Atkinson is a 50 y.o. female with clinical stage IIB Her2/neu+ right breast cancer who is seen for review of interval breast imaging and assessment prior to cycle #1 neoadjuvant Herceptin, Perjeta, and Taxol.  HPI:  The patient was last seen in the medical oncology clinic on 11/08/2015.  At that time, she received cycle #4 AC with Neulasta support.  She states that she tolerated the last cycle better than cycle #3.  Per prior plan, she underwent re-evaluation.   Right sided mammogram and ultrasound on 11/25/2015 revealed extensive disease throughout the right breast. By ultrasound, the central mass was slightly smaller (4.1 x 2.3 x 4 cm to 3.1 x 1.0 x 2.4 cm) but now associated with increased nipple retraction. An adjacent nodule is larger (1.2 x 1.0 x 1.3 cm to 1.3 x 1.1 x 1.2 cm), consistent with a mixed response to chemotherapy.  The extent of calcifications was larger, measuring at least 3.8 x 3.1 x 8.1 cm.  Echocardiogram on 11/25/2015 revealed an EF of 60-65%.  Symptomatically, she feels good.  She denies any complains.  Past Medical History  Diagnosis Date  . Breast cancer ([place])   . Anxiety   . Depression   . Anemia     H/O  . Complication of anesthesia     HARD TO WAKE UP AFTER TONSILLECTOMY AS A CHILD BUT NO PROBLEMS SINCE    Past Surgical History  Procedure Laterality Date  . Breast biopsy Bilateral     08/17/2015   . Abdominal hysterectomy  2001  . Tonsillectomy    . Back surgery      FOR SCOLIOSIS-HERRINGTON RODS IN PLACE  . Portacath placement N/A 08/30/2015    Procedure: INSERTION PORT-A-CATH;  Surgeon: Leonie Green, MD;  Location: [facility] ORS;  Service: General;  Laterality: N/A;    Family History  Problem Relation Age of Onset  . Breast cancer Maternal Aunt 64  . Cancer Father   . Cancer Sister   . Cancer Maternal Grandfather     Social History:  reports  that she quit smoking about 16 years ago. Her smoking use included Cigarettes. She has a 10 pack-year smoking history. She does not have any smokeless tobacco history on file. She reports that she drinks alcohol. She reports that she does not use illicit drugs.  The patient is accompanied by her husband and mothertoday.  Allergies:  Allergies  Allergen Reactions  . Ceftin [Cefuroxime Axetil] Rash    Current Medications: Current Outpatient Prescriptions  Medication Sig Dispense Refill  . ALPRAZolam (XANAX) 0.25 MG tablet Take 1/4 tablet up to three times daily as needed    . Dexlansoprazole (DEXILANT) 30 MG capsule Take 30 mg by mouth daily.    . diphenhydrAMINE (SOMINEX) 25 MG tablet Take 25 mg by mouth at bedtime.     . lidocaine-prilocaine (EMLA) cream Apply 1 application topically as needed. Apply at least 1-2 hours before the port is to be used. 30 g 1  . LORazepam (ATIVAN) 0.5 MG tablet Take 1 tablet (0.5 mg total) by mouth every 6 (six) hours as needed (Nausea or vomiting). 30 tablet 0  . Multiple Vitamin (MULTIVITAMIN) capsule Take 1 capsule by mouth daily.    . potassium chloride (K-DUR,KLOR-CON) 10 MEQ tablet   0   No current facility-administered medications for this visit.    Review of Systems:  GENERAL:  Feels good.  No fevers or sweats.  Weight stable. PERFORMANCE STATUS (ECOG):  1 HEENT:  No visual changes, runny nose, sore throat, mouth sores or tenderness. Lungs: No shortness of breath or cough.  No hemoptysis. Cardiac:  No chest pain, palpitations, orthopnea, or PND. GI:  Appetite modest.  No nausea, vomiting, diarrhea, constipation, melena or hematochezia. GU:  No urgency, frequency, dysuria, or hematuria. Musculoskeletal:  No back pain.  No joint pain.  No muscle tenderness. Extremities:  No pain or swelling. Skin:  No rashes or skin changes. Neuro:  No numbness or weakness, balance or coordination issues. Endocrine:  No diabetes, thyroid issues, hot flashes or  night sweats. Psych:  No mood changes or depression. Pain:  No focal pain. Review of systems:  All other systems reviewed and found to be negative.  Physical Exam: Blood pressure 123/78, pulse 102, temperature 98.7 F (37.1 C), temperature source Tympanic, weight 108 lb 14.5 oz (49.4 kg).  No orthostasis. GENERAL:  Thin woman sitting comfortably in the exam room in no acute distress.  MENTAL STATUS:  Alert and oriented to person, place and time. HEAD:  Wearing a cap.  Alopecia.  Normocephalic, atraumatic, face symmetric (thin), no Cushingoid features. EYES:  Blue eyes.  Pupils equal round and reactive to light and accomodation.  No conjunctivitis or scleral icterus. ENT:  Oropharynx clear without lesion.  Tongue normal. Mucous membranes moist.  RESPIRATORY:  Clear to auscultation without rales, wheezes or rhonchi. CARDIOVASCULAR:  Regular rate and rhythm without murmur, rub or gallop. No JVD. BREAST:  Right central breast 4 cm mass with nipple deformity.  No skin changes.  ABDOMEN:  Soft, non-tender, with active bowel sounds, and no hepatosplenomegaly.  No masses. SKIN:  No rashes, ulcers or lesions. EXTREMITIES: No edema, no skin discoloration or tenderness.  No palpable cords. LYMPH NODES: No palpable cervical, supraclavicular, axillary or inguinal adenopathy  NEUROLOGICAL: Unremarkable. PSYCH:  Appropriate.   Appointment on 11/29/2015  Component Date Value Ref Range Status  . WBC 11/29/2015 11.2* 3.6 - 11.0 K/uL Final  . RBC 11/29/2015 3.20* 3.80 - 5.20 MIL/uL Final  . Hemoglobin 11/29/2015 10.5* 12.0 - 16.0 g/dL Final  . HCT 11/29/2015 30.6* 35.0 - 47.0 % Final  . MCV 11/29/2015 95.7  80.0 - 100.0 fL Final  . MCH 11/29/2015 32.7  26.0 - 34.0 pg Final  . MCHC 11/29/2015 34.2  32.0 - 36.0 g/dL Final  . RDW 11/29/2015 17.1* 11.5 - 14.5 % Final  . Platelets 11/29/2015 352  150 - 440 K/uL Final  . Neutrophils Relative % 11/29/2015 85   Final  . Neutro Abs 11/29/2015 9.5* 1.4 - 6.5  K/uL Final  . Lymphocytes Relative 11/29/2015 6   Final  . Lymphs Abs 11/29/2015 0.6* 1.0 - 3.6 K/uL Final  . Monocytes Relative 11/29/2015 9   Final  . Monocytes Absolute 11/29/2015 1.0* 0.2 - 0.9 K/uL Final  . Eosinophils Relative 11/29/2015 0   Final  . Eosinophils Absolute 11/29/2015 0.0  0 - 0.7 K/uL Final  . Basophils Relative 11/29/2015 0   Final  . Basophils Absolute 11/29/2015 0.0  0 - 0.1 K/uL Final  . Sodium 11/29/2015 137  135 - 145 mmol/L Final  . Potassium 11/29/2015 3.5  3.5 - 5.1 mmol/L Final  . Chloride 11/29/2015 105  101 - 111 mmol/L Final  . CO2 11/29/2015 25  22 - 32 mmol/L Final  . Glucose, Bld 11/29/2015 115* 65 - 99 mg/dL Final  . BUN 11/29/2015 13  6 -  20 mg/dL Final  . Creatinine, Ser 11/29/2015 0.48  0.44 - 1.00 mg/dL Final  . Calcium 11/29/2015 8.8* 8.9 - 10.3 mg/dL Final  . Total Protein 11/29/2015 6.9  6.5 - 8.1 g/dL Final  . Albumin 11/29/2015 4.1  3.5 - 5.0 g/dL Final  . AST 11/29/2015 15  15 - 41 U/L Final  . ALT 11/29/2015 16  14 - 54 U/L Final  . Alkaline Phosphatase 11/29/2015 76  38 - 126 U/L Final  . Total Bilirubin 11/29/2015 0.7  0.3 - 1.2 mg/dL Final  . GFR calc non Af Amer 11/29/2015 >60  >60 mL/min Final  . GFR calc Af Amer 11/29/2015 >60  >60 mL/min Final   Comment: (NOTE) The eGFR has been calculated using the CKD EPI equation. This calculation has not been validated in all clinical situations. eGFR's persistently <60 mL/min signify possible Chronic Kidney Disease.   . Anion gap 11/29/2015 7  5 - 15 Final  . Magnesium 11/29/2015 2.0  1.7 - 2.4 mg/dL Final    Assessment:  Emma Atkinson is a 50 y.o. female with clinical stage T2N1 (stage IIB) Her2/neu + right breast cancer status post biopsy.  She presented with a minimally painful right breast mass with nipple inversion.  Bilateral mammogram and ultrasound on 08/12/2015 revealed a 4.1 cm right superior breast mass containing pleomorphic microcalcifications. There were several satellite  lesions (1.3 cm, 0.9 cm, 0.6 cm) plus a 1.3 cm mass versus complicated cyst and a 0.8 cm cyst.  There was one 5 mm right axillary node with irregular thickened cortex.   Left breast revealed a 1.5 cm cyst containing intramural nodule versus debris at the 9:00 position 2 cm from the nipple. There was also an indeterminate left breast 7 mm nodule at the 2:00 position 4 cm from the nipple.  Left breast biopsy at the 9:00 position on 08/17/2015 revealed intraductal papilloma with fibrocystic changes.   Core needle biopsy on 08/17/2015 of the right breast mass revealed a grade 3 invasive mammary carcinoma. Right axillary biopsy confirmed metastatic mammary carcinoma. Tumor was ER positive (1-10%), PR positive (11-50%) and HER-2/neu 3+  PET scan on 08/26/2015 revealed hypermetabolic right breast mass (SUV 11.3) with a hypermetabolic right axillary lymph node and a subtle focus of very faint hypermetabolic activity laterally in the right breast. There was faint hypermetabolic activity at the biopsy site of the left breast (? biopsy related).  CA27.29 was 19.6 on 08/22/2015.  She is s/p partial hysterectomy.  She is premenopausal (Ronan 5 and estradiol 271.9) on 08/22/2015.  Echo on 08/26/2015 revealed an ejection fraction of 55-60%.  Echo on 11/25/2015 revealed an EF of 60-65%.  She has a family history of breast and ovarian cancer.  My Risk genetic testing revealed no mutation on 08/22/2015.  She  Received 4 cycles of AC (09/13/2015 - 11/08/2015) with Neulasta support. Right sided mammogram and ultrasound on 11/25/2015 revealed extensive disease throughout the right breast. By ultrasound, the central mass was slightly smaller (4.1 x 2.3 x 4 cm to 3.1 x 1.0 x 2.4 cm) but with increased nipple retraction. An adjacent nodule was slightly larger (1.2 x 1.0 x 1.3 cm to 1.3 x 1.1 x 1.2 cm).  The extent of calcifications was larger, measuring at least 3.8 x 3.1 x 8.1 cm.  Symptomatically, she feels good.  Breast exam  reveals a central breast 4 cm mass with nipple deformity.  There are no skin changes.  Plan: 1.  Labs today:  CBC  with diff, CMP. 2.  Review echo, mammogram, and ultrasound.  Discuss plan to proceed with Taxol, Herceptin, and Perjeta.  Herceptin given every 3 weeks x 1 year.  Perjeta given every 3 weeks x 4 cycles.  Taxol given weekly x 12.  Side effects reviewed in detail.  Patient consented to treatment. 3.  Discuss plan for reimaging of breast after 6 weeks to assess response to anti-Her2/neudirected therapy.  Anticipate surgery after completion of Taxol unless imaging studies show progressive disease. 4.  Cycle #1 Herceptin and Perjeta.  Begin week #1 Taxol. 5.  RTC in 1 week for MD assessment, labs (CBC with diff, CMP), and week #2 Taxol.   Lequita Asal, MD  11/29/2015, 9:32 AM

## 2015-12-06 ENCOUNTER — Inpatient Hospital Stay (HOSPITAL_BASED_OUTPATIENT_CLINIC_OR_DEPARTMENT_OTHER): Payer: 59 | Admitting: Hematology and Oncology

## 2015-12-06 ENCOUNTER — Inpatient Hospital Stay: Payer: 59

## 2015-12-06 ENCOUNTER — Other Ambulatory Visit: Payer: Self-pay | Admitting: Hematology and Oncology

## 2015-12-06 VITALS — BP 125/87 | HR 94 | Temp 98.1°F | Wt 106.9 lb

## 2015-12-06 DIAGNOSIS — D242 Benign neoplasm of left breast: Secondary | ICD-10-CM | POA: Diagnosis not present

## 2015-12-06 DIAGNOSIS — G629 Polyneuropathy, unspecified: Secondary | ICD-10-CM | POA: Diagnosis not present

## 2015-12-06 DIAGNOSIS — Z5112 Encounter for antineoplastic immunotherapy: Secondary | ICD-10-CM | POA: Diagnosis not present

## 2015-12-06 DIAGNOSIS — Z170 Estrogen receptor positive status [ER+]: Secondary | ICD-10-CM

## 2015-12-06 DIAGNOSIS — C50811 Malignant neoplasm of overlapping sites of right female breast: Secondary | ICD-10-CM

## 2015-12-06 DIAGNOSIS — Z79899 Other long term (current) drug therapy: Secondary | ICD-10-CM

## 2015-12-06 DIAGNOSIS — Z803 Family history of malignant neoplasm of breast: Secondary | ICD-10-CM

## 2015-12-06 DIAGNOSIS — R634 Abnormal weight loss: Secondary | ICD-10-CM

## 2015-12-06 DIAGNOSIS — T451X5A Adverse effect of antineoplastic and immunosuppressive drugs, initial encounter: Secondary | ICD-10-CM

## 2015-12-06 DIAGNOSIS — Z862 Personal history of diseases of the blood and blood-forming organs and certain disorders involving the immune mechanism: Secondary | ICD-10-CM

## 2015-12-06 DIAGNOSIS — R5383 Other fatigue: Secondary | ICD-10-CM

## 2015-12-06 DIAGNOSIS — F418 Other specified anxiety disorders: Secondary | ICD-10-CM | POA: Diagnosis not present

## 2015-12-06 DIAGNOSIS — Z809 Family history of malignant neoplasm, unspecified: Secondary | ICD-10-CM

## 2015-12-06 DIAGNOSIS — C50111 Malignant neoplasm of central portion of right female breast: Secondary | ICD-10-CM

## 2015-12-06 DIAGNOSIS — Z8041 Family history of malignant neoplasm of ovary: Secondary | ICD-10-CM

## 2015-12-06 DIAGNOSIS — C773 Secondary and unspecified malignant neoplasm of axilla and upper limb lymph nodes: Secondary | ICD-10-CM

## 2015-12-06 DIAGNOSIS — Z87891 Personal history of nicotine dependence: Secondary | ICD-10-CM

## 2015-12-06 DIAGNOSIS — G620 Drug-induced polyneuropathy: Secondary | ICD-10-CM

## 2015-12-06 LAB — COMPREHENSIVE METABOLIC PANEL
ALT: 66 U/L — ABNORMAL HIGH (ref 14–54)
AST: 37 U/L (ref 15–41)
Albumin: 4.3 g/dL (ref 3.5–5.0)
Alkaline Phosphatase: 88 U/L (ref 38–126)
Anion gap: 5 (ref 5–15)
BUN: 12 mg/dL (ref 6–20)
CO2: 25 mmol/L (ref 22–32)
Calcium: 9 mg/dL (ref 8.9–10.3)
Chloride: 106 mmol/L (ref 101–111)
Creatinine, Ser: 0.54 mg/dL (ref 0.44–1.00)
GFR calc Af Amer: >60 mL/min (ref >60)
GFR calc non Af Amer: >60 mL/min (ref >60)
Glucose, Bld: 87 mg/dL (ref 65–99)
Potassium: 3.9 mmol/L (ref 3.5–5.1)
Sodium: 136 mmol/L (ref 135–145)
Total Bilirubin: 0.6 mg/dL (ref 0.3–1.2)
Total Protein: 6.9 g/dL (ref 6.5–8.1)

## 2015-12-06 LAB — CBC WITH DIFFERENTIAL/PLATELET
Basophils Absolute: 0 10*3/uL (ref 0–0.1)
Basophils Relative: 1 %
Eosinophils Absolute: 0 10*3/uL (ref 0–0.7)
Eosinophils Relative: 0 %
HCT: 29.4 % — ABNORMAL LOW (ref 35.0–47.0)
Hemoglobin: 10.3 g/dL — ABNORMAL LOW (ref 12.0–16.0)
Lymphocytes Relative: 9 %
Lymphs Abs: 0.6 10*3/uL — ABNORMAL LOW (ref 1.0–3.6)
MCH: 33.6 pg (ref 26.0–34.0)
MCHC: 35 g/dL (ref 32.0–36.0)
MCV: 96 fL (ref 80.0–100.0)
Monocytes Absolute: 0.5 10*3/uL (ref 0.2–0.9)
Monocytes Relative: 7 %
Neutro Abs: 5.5 10*3/uL (ref 1.4–6.5)
Neutrophils Relative %: 83 %
Platelets: 341 10*3/uL (ref 150–440)
RBC: 3.07 MIL/uL — ABNORMAL LOW (ref 3.80–5.20)
RDW: 16.8 % — ABNORMAL HIGH (ref 11.5–14.5)
WBC: 6.7 10*3/uL (ref 3.6–11.0)

## 2015-12-06 LAB — TSH: TSH: 2.372 u[IU]/mL (ref 0.350–4.500)

## 2015-12-06 MED ORDER — DIPHENHYDRAMINE HCL 50 MG/ML IJ SOLN
50.0000 mg | Freq: Once | INTRAMUSCULAR | Status: AC
  Administered 2015-12-06: 50 mg via INTRAVENOUS
  Filled 2015-12-06: qty 1

## 2015-12-06 MED ORDER — SODIUM CHLORIDE 0.9 % IV SOLN
Freq: Once | INTRAVENOUS | Status: AC
Start: 2015-12-06 — End: 2015-12-06
  Administered 2015-12-06: 11:00:00 via INTRAVENOUS
  Filled 2015-12-06: qty 1000

## 2015-12-06 MED ORDER — SODIUM CHLORIDE 0.9 % IV SOLN
20.0000 mg | Freq: Once | INTRAVENOUS | Status: AC
  Administered 2015-12-06: 20 mg via INTRAVENOUS
  Filled 2015-12-06: qty 2

## 2015-12-06 MED ORDER — FAMOTIDINE IN NACL 20-0.9 MG/50ML-% IV SOLN
20.0000 mg | Freq: Once | INTRAVENOUS | Status: AC
  Administered 2015-12-06: 20 mg via INTRAVENOUS
  Filled 2015-12-06: qty 50

## 2015-12-06 MED ORDER — SODIUM CHLORIDE 0.9% FLUSH
10.0000 mL | INTRAVENOUS | Status: DC | PRN
  Administered 2015-12-06: 10 mL
  Filled 2015-12-06: qty 10

## 2015-12-06 MED ORDER — PACLITAXEL CHEMO INJECTION 300 MG/50ML
80.0000 mg/m2 | Freq: Once | INTRAVENOUS | Status: AC
  Administered 2015-12-06: 120 mg via INTRAVENOUS
  Filled 2015-12-06: qty 20

## 2015-12-06 MED ORDER — HEPARIN SOD (PORK) LOCK FLUSH 100 UNIT/ML IV SOLN
500.0000 [IU] | Freq: Once | INTRAVENOUS | Status: AC | PRN
  Administered 2015-12-06: 500 [IU]
  Filled 2015-12-06: qty 5

## 2015-12-06 NOTE — Progress Notes (Signed)
[facility] day:  12/06/2015   Chief Complaint: Emma Atkinson is a 50 y.o. female with clinical stage IIB Her2/neu+ right breast cancer who is seen for assessment prior to week #2 neoadjuvant Taxol.  HPI:  The patient was last seen in the medical oncology clinic on 11/29/2015.  At that time, she received cycle #1 Herceptin and Perjeta and week #1 Taxol. She tolerated her chemotherapy well.  During the interim, she states that she has felt tired and foggy.  She notes a little neuropathy. The toes and heels of her feet feel cold.  She notes a touch of numbness in her fingertips.  Weight is down 2 pounds.  Sleep has been poor.   Past Medical History  Diagnosis Date  . Breast cancer ([place])   . Anxiety   . Depression   . Anemia     H/O  . Complication of anesthesia     HARD TO WAKE UP AFTER TONSILLECTOMY AS A CHILD BUT NO PROBLEMS SINCE    Past Surgical History  Procedure Laterality Date  . Breast biopsy Bilateral     08/17/2015   . Abdominal hysterectomy  2001  . Tonsillectomy    . Back surgery      FOR SCOLIOSIS-HERRINGTON RODS IN PLACE  . Portacath placement N/A 08/30/2015    Procedure: INSERTION PORT-A-CATH;  Surgeon: Leonie Green, MD;  Location: [facility] ORS;  Service: General;  Laterality: N/A;    Family History  Problem Relation Age of Onset  . Breast cancer Maternal Aunt 64  . Cancer Father   . Cancer Sister   . Cancer Maternal Grandfather     Social History:  reports that she quit smoking about 16 years ago. Her smoking use included Cigarettes. She has a 10 pack-year smoking history. She does not have any smokeless tobacco history on file. She reports that she drinks alcohol. She reports that she does not use illicit drugs.  The patient is accompanied by her husband today.  Allergies:  Allergies  Allergen Reactions  . Ceftin [Cefuroxime Axetil] Rash    Current Medications: Current Outpatient Prescriptions  Medication  Sig Dispense Refill  . ALPRAZolam (XANAX) 0.25 MG tablet Take 1/4 tablet up to three times daily as needed    . Dexlansoprazole (DEXILANT) 30 MG capsule Take 30 mg by mouth daily.    . diphenhydrAMINE (SOMINEX) 25 MG tablet Take 25 mg by mouth at bedtime.     . lidocaine-prilocaine (EMLA) cream Apply 1 application topically as needed. Apply at least 1-2 hours before the port is to be used. 30 g 1  . LORazepam (ATIVAN) 0.5 MG tablet Take 1 tablet (0.5 mg total) by mouth every 6 (six) hours as needed (Nausea or vomiting). 30 tablet 0  . Multiple Vitamin (MULTIVITAMIN) capsule Take 1 capsule by mouth daily.     No current facility-administered medications for this visit.    Review of Systems:  GENERAL:  Little tired and foggy.  No fevers or sweats.  Weightdown 2 pounds. PERFORMANCE STATUS (ECOG):  1 HEENT:  No visual changes, runny nose, sore throat, mouth sores or tenderness. Lungs: No shortness of breath or cough.  No hemoptysis. Cardiac:  No chest pain, palpitations, orthopnea, or PND. GI:  Appetite modest.  No nausea, vomiting, diarrhea, constipation, melena or hematochezia. GU:  No urgency, frequency, dysuria, or hematuria. Musculoskeletal:  No back pain.  No joint pain.  No muscle tenderness. Extremities:  No pain or  swelling. Skin:  No rashes or skin changes. Neuro:  Foggy.  Little numbness in fingertips.  Feet feel cold.  No weakness, balance or coordination issues. Endocrine:  No diabetes, thyroid issues, hot flashes or night sweats. Psych:  No mood changes or depression.  Poor sleep. Pain:  No focal pain. Review of systems:  All other systems reviewed and found to be negative.  Physical Exam: Blood pressure 125/87, pulse 94, temperature 98.1 F (36.7 C), temperature source Oral, weight 106 lb 14.8 oz (48.5 kg).  No orthostasis. GENERAL:  Thin woman sitting comfortably in the exam room in no acute distress.  MENTAL STATUS:  Alert and oriented to person, place and time. HEAD:   Wearing a gray cap.  Alopecia.  Normocephalic, atraumatic, face symmetric (thin), no Cushingoid features. EYES:  Blue eyes.  Pupils equal round and reactive to light and accomodation.  No conjunctivitis or scleral icterus. ENT:  Oropharynx clear without lesion.  Tongue normal. Mucous membranes moist.  RESPIRATORY:  Clear to auscultation without rales, wheezes or rhonchi. CARDIOVASCULAR:  Regular rate and rhythm without murmur, rub or gallop. No JVD. ABDOMEN:  Soft, non-tender, with active bowel sounds, and no hepatosplenomegaly.  No masses. SKIN:  No rashes, ulcers or lesions. EXTREMITIES: Warm.  No edema, no skin discoloration or tenderness.  No palpable cords. LYMPH NODES: No palpable cervical, supraclavicular, axillary or inguinal adenopathy  NEUROLOGICAL: Unremarkable. PSYCH:  Appropriate.   Appointment on 12/06/2015  Component Date Value Ref Range Status  . WBC 12/06/2015 6.7  3.6 - 11.0 K/uL Final  . RBC 12/06/2015 3.07* 3.80 - 5.20 MIL/uL Final  . Hemoglobin 12/06/2015 10.3* 12.0 - 16.0 g/dL Final  . HCT 12/06/2015 29.4* 35.0 - 47.0 % Final  . MCV 12/06/2015 96.0  80.0 - 100.0 fL Final  . MCH 12/06/2015 33.6  26.0 - 34.0 pg Final  . MCHC 12/06/2015 35.0  32.0 - 36.0 g/dL Final  . RDW 12/06/2015 16.8* 11.5 - 14.5 % Final  . Platelets 12/06/2015 341  150 - 440 K/uL Final  . Neutrophils Relative % 12/06/2015 83   Final  . Neutro Abs 12/06/2015 5.5  1.4 - 6.5 K/uL Final  . Lymphocytes Relative 12/06/2015 9   Final  . Lymphs Abs 12/06/2015 0.6* 1.0 - 3.6 K/uL Final  . Monocytes Relative 12/06/2015 7   Final  . Monocytes Absolute 12/06/2015 0.5  0.2 - 0.9 K/uL Final  . Eosinophils Relative 12/06/2015 0   Final  . Eosinophils Absolute 12/06/2015 0.0  0 - 0.7 K/uL Final  . Basophils Relative 12/06/2015 1   Final  . Basophils Absolute 12/06/2015 0.0  0 - 0.1 K/uL Final  . Sodium 12/06/2015 136  135 - 145 mmol/L Final  . Potassium 12/06/2015 3.9  3.5 - 5.1 mmol/L Final  . Chloride  12/06/2015 106  101 - 111 mmol/L Final  . CO2 12/06/2015 25  22 - 32 mmol/L Final  . Glucose, Bld 12/06/2015 87  65 - 99 mg/dL Final  . BUN 12/06/2015 12  6 - 20 mg/dL Final  . Creatinine, Ser 12/06/2015 0.54  0.44 - 1.00 mg/dL Final  . Calcium 12/06/2015 9.0  8.9 - 10.3 mg/dL Final  . Total Protein 12/06/2015 6.9  6.5 - 8.1 g/dL Final  . Albumin 12/06/2015 4.3  3.5 - 5.0 g/dL Final  . AST 12/06/2015 37  15 - 41 U/L Final  . ALT 12/06/2015 66* 14 - 54 U/L Final  . Alkaline Phosphatase 12/06/2015 88  38 - 126  U/L Final  . Total Bilirubin 12/06/2015 0.6  0.3 - 1.2 mg/dL Final  . GFR calc non Af Amer 12/06/2015 >60  >60 mL/min Final  . GFR calc Af Amer 12/06/2015 >60  >60 mL/min Final   Comment: (NOTE) The eGFR has been calculated using the CKD EPI equation. This calculation has not been validated in all clinical situations. eGFR's persistently <60 mL/min signify possible Chronic Kidney Disease.   . Anion gap 12/06/2015 5  5 - 15 Final    Assessment:  Emma Atkinson is a 50 y.o. female with clinical stage T2N1 (stage IIB) Her2/neu + right breast cancer status post biopsy.  She presented with a minimally painful right breast mass with nipple inversion.  Bilateral mammogram and ultrasound on 08/12/2015 revealed a 4.1 cm right superior breast mass containing pleomorphic microcalcifications. There were several satellite lesions (1.3 cm, 0.9 cm, 0.6 cm) plus a 1.3 cm mass versus complicated cyst and a 0.8 cm cyst.  There was one 5 mm right axillary node with irregular thickened cortex.   Left breast revealed a 1.5 cm cyst containing intramural nodule versus debris at the 9:00 position 2 cm from the nipple. There was also an indeterminate left breast 7 mm nodule at the 2:00 position 4 cm from the nipple.  Left breast biopsy at the 9:00 position on 08/17/2015 revealed intraductal papilloma with fibrocystic changes.   Core needle biopsy on 08/17/2015 of the right breast mass revealed a grade 3  invasive mammary carcinoma. Right axillary biopsy confirmed metastatic mammary carcinoma. Tumor was ER positive (1-10%), PR positive (11-50%) and HER-2/neu 3+  PET scan on 08/26/2015 revealed hypermetabolic right breast mass (SUV 11.3) with a hypermetabolic right axillary lymph node and a subtle focus of very faint hypermetabolic activity laterally in the right breast. There was faint hypermetabolic activity at the biopsy site of the left breast (? biopsy related).  CA27.29 was 19.6 on 08/22/2015.  She is s/p partial hysterectomy.  She is premenopausal (Dixon 5 and estradiol 271.9) on 08/22/2015.  Echo on 08/26/2015 revealed an ejection fraction of 55-60%.  Echo on 11/25/2015 revealed an EF of 60-65%.  She has a family history of breast and ovarian cancer.  My Risk genetic testing revealed no mutation on 08/22/2015.  She received 4 cycles of AC (09/13/2015 - 11/08/2015) with Neulasta support. Right sided mammogram and ultrasound on 11/25/2015 revealed extensive disease throughout the right breast. By ultrasound, the central mass was slightly smaller (4.1 x 2.3 x 4 cm to 3.1 x 1.0 x 2.4 cm) but with increased nipple retraction. An adjacent nodule was slightly larger (1.2 x 1.0 x 1.3 cm to 1.3 x 1.1 x 1.2 cm).  The extent of calcifications was larger, measuring at least 3.8 x 3.1 x 8.1 cm.  She began Herceptin, Perjeta, and Taxol on 11/29/2015.  She is tolerating her chemotherapy well.  She has a grade I neuropathy.  She feels fatigued.  She has lost 2 pounds.  She notes a little numbness in her fingertips and cold feet.  Exam is stable.  Plan: 1.  Labs today:  CBC with diff, CMP. 2.  Discuss grade I neuropathy.  Continue Taxol. 3.  Week #2 Taxol today. 4.  Continue weekly labs and Taxol. 5.  Discuss diet and caloric intake. 6.  Anticipate follow-up right breast ultrasound on 01/06/2016. 7.  RTC in 2 weeks for MD assessment, labs (CBC with diff, CMP, Mg), cycle #2 Herceptin and Perjeta and week #4  Taxol.  Lequita Asal, MD  12/06/2015, 9:54 AM

## 2015-12-07 LAB — T4: T4, Total: 10.5 ug/dL (ref 4.5–12.0)

## 2015-12-09 ENCOUNTER — Encounter: Payer: Self-pay | Admitting: Hematology and Oncology

## 2015-12-11 ENCOUNTER — Encounter: Payer: Self-pay | Admitting: Hematology and Oncology

## 2015-12-13 ENCOUNTER — Inpatient Hospital Stay: Payer: 59

## 2015-12-13 ENCOUNTER — Other Ambulatory Visit: Payer: Self-pay | Admitting: Hematology and Oncology

## 2015-12-13 DIAGNOSIS — R5383 Other fatigue: Secondary | ICD-10-CM | POA: Diagnosis not present

## 2015-12-13 DIAGNOSIS — C50111 Malignant neoplasm of central portion of right female breast: Secondary | ICD-10-CM

## 2015-12-13 DIAGNOSIS — G629 Polyneuropathy, unspecified: Secondary | ICD-10-CM | POA: Diagnosis not present

## 2015-12-13 DIAGNOSIS — F418 Other specified anxiety disorders: Secondary | ICD-10-CM | POA: Diagnosis not present

## 2015-12-13 DIAGNOSIS — C773 Secondary and unspecified malignant neoplasm of axilla and upper limb lymph nodes: Secondary | ICD-10-CM | POA: Diagnosis not present

## 2015-12-13 DIAGNOSIS — Z5112 Encounter for antineoplastic immunotherapy: Secondary | ICD-10-CM | POA: Diagnosis not present

## 2015-12-13 DIAGNOSIS — Z862 Personal history of diseases of the blood and blood-forming organs and certain disorders involving the immune mechanism: Secondary | ICD-10-CM | POA: Diagnosis not present

## 2015-12-13 DIAGNOSIS — Z170 Estrogen receptor positive status [ER+]: Secondary | ICD-10-CM | POA: Diagnosis not present

## 2015-12-13 DIAGNOSIS — D242 Benign neoplasm of left breast: Secondary | ICD-10-CM | POA: Diagnosis not present

## 2015-12-13 DIAGNOSIS — C50811 Malignant neoplasm of overlapping sites of right female breast: Secondary | ICD-10-CM

## 2015-12-13 LAB — CBC WITH DIFFERENTIAL/PLATELET
Basophils Absolute: 0 10*3/uL (ref 0–0.1)
Basophils Relative: 1 %
Eosinophils Absolute: 0.2 10*3/uL (ref 0–0.7)
Eosinophils Relative: 2 %
HCT: 31 % — ABNORMAL LOW (ref 35.0–47.0)
Hemoglobin: 10.7 g/dL — ABNORMAL LOW (ref 12.0–16.0)
Lymphocytes Relative: 12 %
Lymphs Abs: 0.9 10*3/uL — ABNORMAL LOW (ref 1.0–3.6)
MCH: 33.1 pg (ref 26.0–34.0)
MCHC: 34.4 g/dL (ref 32.0–36.0)
MCV: 96.4 fL (ref 80.0–100.0)
Monocytes Absolute: 0.5 10*3/uL (ref 0.2–0.9)
Monocytes Relative: 7 %
Neutro Abs: 5.7 10*3/uL (ref 1.4–6.5)
Neutrophils Relative %: 78 %
Platelets: 350 10*3/uL (ref 150–440)
RBC: 3.22 MIL/uL — ABNORMAL LOW (ref 3.80–5.20)
RDW: 15.8 % — ABNORMAL HIGH (ref 11.5–14.5)
WBC: 7.3 10*3/uL (ref 3.6–11.0)

## 2015-12-13 LAB — COMPREHENSIVE METABOLIC PANEL
ALT: 42 U/L (ref 14–54)
AST: 26 U/L (ref 15–41)
Albumin: 4.1 g/dL (ref 3.5–5.0)
Alkaline Phosphatase: 78 U/L (ref 38–126)
Anion gap: 7 (ref 5–15)
BUN: 11 mg/dL (ref 6–20)
CO2: 26 mmol/L (ref 22–32)
Calcium: 8.8 mg/dL — ABNORMAL LOW (ref 8.9–10.3)
Chloride: 103 mmol/L (ref 101–111)
Creatinine, Ser: 0.69 mg/dL (ref 0.44–1.00)
GFR calc Af Amer: >60 mL/min (ref >60)
GFR calc non Af Amer: >60 mL/min (ref >60)
Glucose, Bld: 81 mg/dL (ref 65–99)
Potassium: 3.9 mmol/L (ref 3.5–5.1)
Sodium: 136 mmol/L (ref 135–145)
Total Bilirubin: 0.6 mg/dL (ref 0.3–1.2)
Total Protein: 6.9 g/dL (ref 6.5–8.1)

## 2015-12-13 MED ORDER — PACLITAXEL CHEMO INJECTION 300 MG/50ML
80.0000 mg/m2 | Freq: Once | INTRAVENOUS | Status: AC
  Administered 2015-12-13: 120 mg via INTRAVENOUS
  Filled 2015-12-13: qty 20

## 2015-12-13 MED ORDER — HEPARIN SOD (PORK) LOCK FLUSH 100 UNIT/ML IV SOLN
500.0000 [IU] | Freq: Once | INTRAVENOUS | Status: AC | PRN
  Administered 2015-12-13: 500 [IU]
  Filled 2015-12-13: qty 5

## 2015-12-13 MED ORDER — DIPHENHYDRAMINE HCL 50 MG/ML IJ SOLN
50.0000 mg | Freq: Once | INTRAMUSCULAR | Status: AC
Start: 2015-12-13 — End: 2015-12-13
  Administered 2015-12-13: 50 mg via INTRAVENOUS
  Filled 2015-12-13: qty 1

## 2015-12-13 MED ORDER — FAMOTIDINE IN NACL 20-0.9 MG/50ML-% IV SOLN
20.0000 mg | Freq: Once | INTRAVENOUS | Status: AC
Start: 2015-12-13 — End: 2015-12-13
  Administered 2015-12-13: 20 mg via INTRAVENOUS
  Filled 2015-12-13: qty 50

## 2015-12-13 MED ORDER — DEXAMETHASONE SODIUM PHOSPHATE 100 MG/10ML IJ SOLN
20.0000 mg | Freq: Once | INTRAMUSCULAR | Status: AC
Start: 2015-12-13 — End: 2015-12-13
  Administered 2015-12-13: 20 mg via INTRAVENOUS
  Filled 2015-12-13: qty 2

## 2015-12-13 MED ORDER — SODIUM CHLORIDE 0.9 % IV SOLN
Freq: Once | INTRAVENOUS | Status: AC
  Administered 2015-12-13: 10:00:00 via INTRAVENOUS
  Filled 2015-12-13: qty 1000

## 2015-12-14 DIAGNOSIS — N189 Chronic kidney disease, unspecified: Secondary | ICD-10-CM

## 2015-12-14 HISTORY — DX: Chronic kidney disease, unspecified: N18.9

## 2015-12-20 ENCOUNTER — Encounter: Payer: Self-pay | Admitting: Hematology and Oncology

## 2015-12-20 ENCOUNTER — Inpatient Hospital Stay: Payer: 59 | Attending: Hematology and Oncology | Admitting: Hematology and Oncology

## 2015-12-20 ENCOUNTER — Inpatient Hospital Stay: Payer: 59

## 2015-12-20 ENCOUNTER — Other Ambulatory Visit: Payer: Self-pay | Admitting: Hematology and Oncology

## 2015-12-20 VITALS — BP 146/90 | HR 112 | Temp 98.6°F | Resp 18 | Ht 61.0 in | Wt 108.0 lb

## 2015-12-20 DIAGNOSIS — R918 Other nonspecific abnormal finding of lung field: Secondary | ICD-10-CM | POA: Insufficient documentation

## 2015-12-20 DIAGNOSIS — G629 Polyneuropathy, unspecified: Secondary | ICD-10-CM | POA: Diagnosis not present

## 2015-12-20 DIAGNOSIS — D242 Benign neoplasm of left breast: Secondary | ICD-10-CM | POA: Diagnosis not present

## 2015-12-20 DIAGNOSIS — Z803 Family history of malignant neoplasm of breast: Secondary | ICD-10-CM | POA: Insufficient documentation

## 2015-12-20 DIAGNOSIS — T451X5A Adverse effect of antineoplastic and immunosuppressive drugs, initial encounter: Secondary | ICD-10-CM

## 2015-12-20 DIAGNOSIS — Z862 Personal history of diseases of the blood and blood-forming organs and certain disorders involving the immune mechanism: Secondary | ICD-10-CM | POA: Insufficient documentation

## 2015-12-20 DIAGNOSIS — C773 Secondary and unspecified malignant neoplasm of axilla and upper limb lymph nodes: Secondary | ICD-10-CM | POA: Insufficient documentation

## 2015-12-20 DIAGNOSIS — Z8041 Family history of malignant neoplasm of ovary: Secondary | ICD-10-CM | POA: Diagnosis not present

## 2015-12-20 DIAGNOSIS — F418 Other specified anxiety disorders: Secondary | ICD-10-CM | POA: Insufficient documentation

## 2015-12-20 DIAGNOSIS — C50811 Malignant neoplasm of overlapping sites of right female breast: Secondary | ICD-10-CM | POA: Diagnosis not present

## 2015-12-20 DIAGNOSIS — C50111 Malignant neoplasm of central portion of right female breast: Secondary | ICD-10-CM

## 2015-12-20 DIAGNOSIS — Z5111 Encounter for antineoplastic chemotherapy: Secondary | ICD-10-CM | POA: Insufficient documentation

## 2015-12-20 DIAGNOSIS — Z87891 Personal history of nicotine dependence: Secondary | ICD-10-CM | POA: Insufficient documentation

## 2015-12-20 DIAGNOSIS — R040 Epistaxis: Secondary | ICD-10-CM | POA: Insufficient documentation

## 2015-12-20 DIAGNOSIS — Z170 Estrogen receptor positive status [ER+]: Secondary | ICD-10-CM | POA: Insufficient documentation

## 2015-12-20 DIAGNOSIS — R32 Unspecified urinary incontinence: Secondary | ICD-10-CM | POA: Diagnosis not present

## 2015-12-20 DIAGNOSIS — Z809 Family history of malignant neoplasm, unspecified: Secondary | ICD-10-CM | POA: Insufficient documentation

## 2015-12-20 DIAGNOSIS — Z79899 Other long term (current) drug therapy: Secondary | ICD-10-CM | POA: Insufficient documentation

## 2015-12-20 DIAGNOSIS — G620 Drug-induced polyneuropathy: Secondary | ICD-10-CM

## 2015-12-20 LAB — COMPREHENSIVE METABOLIC PANEL
ALT: 49 U/L (ref 14–54)
AST: 28 U/L (ref 15–41)
Albumin: 4.1 g/dL (ref 3.5–5.0)
Alkaline Phosphatase: 94 U/L (ref 38–126)
Anion gap: 7 (ref 5–15)
BUN: 10 mg/dL (ref 6–20)
CO2: 25 mmol/L (ref 22–32)
Calcium: 9 mg/dL (ref 8.9–10.3)
Chloride: 106 mmol/L (ref 101–111)
Creatinine, Ser: 0.58 mg/dL (ref 0.44–1.00)
GFR calc Af Amer: >60 mL/min (ref >60)
GFR calc non Af Amer: >60 mL/min (ref >60)
Glucose, Bld: 68 mg/dL (ref 65–99)
Potassium: 3.8 mmol/L (ref 3.5–5.1)
Sodium: 138 mmol/L (ref 135–145)
Total Bilirubin: 0.5 mg/dL (ref 0.3–1.2)
Total Protein: 7 g/dL (ref 6.5–8.1)

## 2015-12-20 LAB — CBC WITH DIFFERENTIAL/PLATELET
Basophils Absolute: 0.1 10*3/uL (ref 0–0.1)
Basophils Relative: 1 %
Eosinophils Absolute: 0.2 10*3/uL (ref 0–0.7)
Eosinophils Relative: 2 %
HCT: 32.1 % — ABNORMAL LOW (ref 35.0–47.0)
Hemoglobin: 10.9 g/dL — ABNORMAL LOW (ref 12.0–16.0)
Lymphocytes Relative: 8 %
Lymphs Abs: 0.9 10*3/uL — ABNORMAL LOW (ref 1.0–3.6)
MCH: 32.4 pg (ref 26.0–34.0)
MCHC: 34 g/dL (ref 32.0–36.0)
MCV: 95.4 fL (ref 80.0–100.0)
Monocytes Absolute: 0.5 10*3/uL (ref 0.2–0.9)
Monocytes Relative: 5 %
Neutro Abs: 9.4 10*3/uL — ABNORMAL HIGH (ref 1.4–6.5)
Neutrophils Relative %: 86 %
Platelets: 359 10*3/uL (ref 150–440)
RBC: 3.36 MIL/uL — ABNORMAL LOW (ref 3.80–5.20)
RDW: 14.3 % (ref 11.5–14.5)
WBC: 11 10*3/uL (ref 3.6–11.0)

## 2015-12-20 MED ORDER — PACLITAXEL CHEMO INJECTION 300 MG/50ML
80.0000 mg/m2 | Freq: Once | INTRAVENOUS | Status: AC
  Administered 2015-12-20: 120 mg via INTRAVENOUS
  Filled 2015-12-20: qty 20

## 2015-12-20 MED ORDER — SODIUM CHLORIDE 0.9% FLUSH
10.0000 mL | Freq: Once | INTRAVENOUS | Status: AC
  Administered 2015-12-20: 10 mL via INTRAVENOUS
  Filled 2015-12-20: qty 10

## 2015-12-20 MED ORDER — SODIUM CHLORIDE 0.9 % IV SOLN
20.0000 mg | Freq: Once | INTRAVENOUS | Status: AC
  Administered 2015-12-20: 20 mg via INTRAVENOUS
  Filled 2015-12-20: qty 2

## 2015-12-20 MED ORDER — ACETAMINOPHEN 325 MG PO TABS
650.0000 mg | ORAL_TABLET | Freq: Once | ORAL | Status: AC
  Administered 2015-12-20: 650 mg via ORAL
  Filled 2015-12-20: qty 2

## 2015-12-20 MED ORDER — SODIUM CHLORIDE 0.9 % IV SOLN
Freq: Once | INTRAVENOUS | Status: AC
  Administered 2015-12-20: 11:00:00 via INTRAVENOUS
  Filled 2015-12-20: qty 1000

## 2015-12-20 MED ORDER — FAMOTIDINE IN NACL 20-0.9 MG/50ML-% IV SOLN
20.0000 mg | Freq: Once | INTRAVENOUS | Status: AC
  Administered 2015-12-20: 20 mg via INTRAVENOUS
  Filled 2015-12-20: qty 50

## 2015-12-20 MED ORDER — HEPARIN SOD (PORK) LOCK FLUSH 100 UNIT/ML IV SOLN
500.0000 [IU] | Freq: Once | INTRAVENOUS | Status: AC
  Administered 2015-12-20: 500 [IU] via INTRAVENOUS
  Filled 2015-12-20: qty 5

## 2015-12-20 MED ORDER — SODIUM CHLORIDE 0.9 % IV SOLN
420.0000 mg | Freq: Once | INTRAVENOUS | Status: AC
  Administered 2015-12-20: 420 mg via INTRAVENOUS
  Filled 2015-12-20: qty 14

## 2015-12-20 MED ORDER — DIPHENHYDRAMINE HCL 50 MG/ML IJ SOLN
50.0000 mg | Freq: Once | INTRAMUSCULAR | Status: AC
  Administered 2015-12-20: 50 mg via INTRAVENOUS
  Filled 2015-12-20: qty 1

## 2015-12-20 MED ORDER — TRASTUZUMAB CHEMO INJECTION 440 MG
6.0000 mg/kg | Freq: Once | INTRAVENOUS | Status: AC
  Administered 2015-12-20: 294 mg via INTRAVENOUS
  Filled 2015-12-20: qty 14

## 2015-12-20 NOTE — Progress Notes (Signed)
[facility] day:  12/20/2015   Chief Complaint: Emma Atkinson is a 50 y.o. female with clinical stage IIB Her2/neu+ right breast cancer who is seen for assessment prior to cycle #2 Herceptin and Perjeta and week #4 neoadjuvant Taxol.  HPI:  The patient was last seen in the medical oncology clinic on 12/06/2015.  At that time, she received week #3 Taxol.    During the interim, she has done well.  She is eating better and has gained 2 pounds.  Her neuropathy is unchanged.  She only notes an increased cold sensitivity in her fingers and toes.  She denies any pain or difficulty buttoning buttons or zipping zippers.  Past Medical History  Diagnosis Date  . Breast cancer ([place])   . Anxiety   . Depression   . Anemia     H/O  . Complication of anesthesia     HARD TO WAKE UP AFTER TONSILLECTOMY AS A CHILD BUT NO PROBLEMS SINCE    Past Surgical History  Procedure Laterality Date  . Breast biopsy Bilateral     08/17/2015   . Abdominal hysterectomy  2001  . Tonsillectomy    . Back surgery      FOR SCOLIOSIS-HERRINGTON RODS IN PLACE  . Portacath placement N/A 08/30/2015    Procedure: INSERTION PORT-A-CATH;  Surgeon: Leonie Green, MD;  Location: [facility] ORS;  Service: General;  Laterality: N/A;    Family History  Problem Relation Age of Onset  . Breast cancer Maternal Aunt 64  . Cancer Father   . Cancer Sister   . Cancer Maternal Grandfather     Social History:  reports that she quit smoking about 16 years ago. Her smoking use included Cigarettes. She has a 10 pack-year smoking history. She does not have any smokeless tobacco history on file. She reports that she drinks alcohol. She reports that she does not use illicit drugs.  The patient is accompanied by her husband today.  Allergies:  Allergies  Allergen Reactions  . Ceftin [Cefuroxime Axetil] Rash    Current Medications: Current Outpatient Prescriptions  Medication Sig Dispense  Refill  . ALPRAZolam (XANAX) 0.25 MG tablet Take 1/4 tablet up to three times daily as needed    . Dexlansoprazole (DEXILANT) 30 MG capsule Take 30 mg by mouth daily.    . diphenhydrAMINE (SOMINEX) 25 MG tablet Take 25 mg by mouth at bedtime.     . lidocaine-prilocaine (EMLA) cream Apply 1 application topically as needed. Apply at least 1-2 hours before the port is to be used. 30 g 1  . LORazepam (ATIVAN) 0.5 MG tablet Take 1 tablet (0.5 mg total) by mouth every 6 (six) hours as needed (Nausea or vomiting). 30 tablet 0  . Multiple Vitamin (MULTIVITAMIN) capsule Take 1 capsule by mouth daily.     No current facility-administered medications for this visit.   Facility-Administered Medications Ordered in Other Visits  Medication Dose Route Frequency Provider Last Rate Last Dose  . heparin lock flush 100 unit/mL  500 Units Intravenous Once Lequita Asal, MD        Review of Systems:  GENERAL:  Little fatigue.  Feels better.  No fevers or sweats.  Weight up 2 pounds. PERFORMANCE STATUS (ECOG):  1 HEENT:  No visual changes, runny nose, sore throat, mouth sores or tenderness. Lungs: No shortness of breath or cough.  No hemoptysis. Cardiac:  No chest pain, palpitations, orthopnea, or PND. GI:  Appetite better.  No nausea, vomiting, diarrhea, constipation, melena or hematochezia. GU:  No urgency, frequency, dysuria, or hematuria. Musculoskeletal:  No back pain.  No joint pain.  No muscle tenderness. Extremities:  No pain or swelling. Skin:  No rashes or skin changes. Neuro:  Fingertips and feet feel cold.  No tingling or burning.  No weakness, balance or coordination issues. Endocrine:  No diabetes, thyroid issues, hot flashes or night sweats. Psych:  No mood changes or depression.  Poor sleep. Pain:  No focal pain. Review of systems:  All other systems reviewed and found to be negative.  Physical Exam: Blood pressure 146/90, pulse 112, temperature 98.6 F (37 C), resp. rate 18, height  '5\' 1"'  (1.549 m), weight 108 lb 0.4 oz (49 kg).  No orthostasis. GENERAL:  Thin woman sitting comfortably in the exam room in no acute distress.  MENTAL STATUS:  Alert and oriented to person, place and time. HEAD:  Wearing a black cap.  Alopecia.  Normocephalic, atraumatic, face symmetric (thin), no Cushingoid features. EYES:  Blue eyes.  Pupils equal round and reactive to light and accomodation.  No conjunctivitis or scleral icterus. ENT:  Oropharynx clear without lesion.  Tongue normal. Mucous membranes moist.  RESPIRATORY:  Clear to auscultation without rales, wheezes or rhonchi. CARDIOVASCULAR:  Regular rate and rhythm without murmur, rub or gallop. No JVD. BREAST:  Right breast with central breast mass (smaller) with nipple deformity.  ABDOMEN:  Soft, non-tender, with active bowel sounds, and no hepatosplenomegaly.  No masses. SKIN:  No rashes, ulcers or lesions. EXTREMITIES: Warm.  No edema, no skin discoloration or tenderness.  No palpable cords. LYMPH NODES: No palpable cervical, supraclavicular, axillary or inguinal adenopathy  NEUROLOGICAL: Unremarkable. PSYCH:  Appropriate.   Infusion on 12/20/2015  Component Date Value Ref Range Status  . WBC 12/20/2015 11.0  3.6 - 11.0 K/uL Final  . RBC 12/20/2015 3.36* 3.80 - 5.20 MIL/uL Final  . Hemoglobin 12/20/2015 10.9* 12.0 - 16.0 g/dL Final  . HCT 12/20/2015 32.1* 35.0 - 47.0 % Final  . MCV 12/20/2015 95.4  80.0 - 100.0 fL Final  . MCH 12/20/2015 32.4  26.0 - 34.0 pg Final  . MCHC 12/20/2015 34.0  32.0 - 36.0 g/dL Final  . RDW 12/20/2015 14.3  11.5 - 14.5 % Final  . Platelets 12/20/2015 359  150 - 440 K/uL Final  . Neutrophils Relative % 12/20/2015 86   Final  . Neutro Abs 12/20/2015 9.4* 1.4 - 6.5 K/uL Final  . Lymphocytes Relative 12/20/2015 8   Final  . Lymphs Abs 12/20/2015 0.9* 1.0 - 3.6 K/uL Final  . Monocytes Relative 12/20/2015 5   Final  . Monocytes Absolute 12/20/2015 0.5  0.2 - 0.9 K/uL Final  . Eosinophils Relative  12/20/2015 2   Final  . Eosinophils Absolute 12/20/2015 0.2  0 - 0.7 K/uL Final  . Basophils Relative 12/20/2015 1   Final  . Basophils Absolute 12/20/2015 0.1  0 - 0.1 K/uL Final  . Sodium 12/20/2015 138  135 - 145 mmol/L Final  . Potassium 12/20/2015 3.8  3.5 - 5.1 mmol/L Final  . Chloride 12/20/2015 106  101 - 111 mmol/L Final  . CO2 12/20/2015 25  22 - 32 mmol/L Final  . Glucose, Bld 12/20/2015 68  65 - 99 mg/dL Final  . BUN 12/20/2015 10  6 - 20 mg/dL Final  . Creatinine, Ser 12/20/2015 0.58  0.44 - 1.00 mg/dL Final  . Calcium 12/20/2015 9.0  8.9 - 10.3 mg/dL Final  .  Total Protein 12/20/2015 7.0  6.5 - 8.1 g/dL Final  . Albumin 12/20/2015 4.1  3.5 - 5.0 g/dL Final  . AST 12/20/2015 28  15 - 41 U/L Final  . ALT 12/20/2015 49  14 - 54 U/L Final  . Alkaline Phosphatase 12/20/2015 94  38 - 126 U/L Final  . Total Bilirubin 12/20/2015 0.5  0.3 - 1.2 mg/dL Final  . GFR calc non Af Amer 12/20/2015 >60  >60 mL/min Final  . GFR calc Af Amer 12/20/2015 >60  >60 mL/min Final   Comment: (NOTE) The eGFR has been calculated using the CKD EPI equation. This calculation has not been validated in all clinical situations. eGFR's persistently <60 mL/min signify possible Chronic Kidney Disease.   . Anion gap 12/20/2015 7  5 - 15 Final    Assessment:  DELEAH TISON is a 50 y.o. female with clinical stage T2N1 (stage IIB) Her2/neu + right breast cancer status post biopsy.  She presented with a minimally painful right breast mass with nipple inversion.  Bilateral mammogram and ultrasound on 08/12/2015 revealed a 4.1 cm right superior breast mass containing pleomorphic microcalcifications. There were several satellite lesions (1.3 cm, 0.9 cm, 0.6 cm) plus a 1.3 cm mass versus complicated cyst and a 0.8 cm cyst.  There was one 5 mm right axillary node with irregular thickened cortex.   Left breast revealed a 1.5 cm cyst containing intramural nodule versus debris at the 9:00 position 2 cm from the  nipple. There was also an indeterminate left breast 7 mm nodule at the 2:00 position 4 cm from the nipple.  Left breast biopsy at the 9:00 position on 08/17/2015 revealed intraductal papilloma with fibrocystic changes.   Core needle biopsy on 08/17/2015 of the right breast mass revealed a grade 3 invasive mammary carcinoma. Right axillary biopsy confirmed metastatic mammary carcinoma. Tumor was ER positive (1-10%), PR positive (11-50%) and HER-2/neu 3+  PET scan on 08/26/2015 revealed hypermetabolic right breast mass (SUV 11.3) with a hypermetabolic right axillary lymph node and a subtle focus of very faint hypermetabolic activity laterally in the right breast. There was faint hypermetabolic activity at the biopsy site of the left breast (? biopsy related).  CA27.29 was 19.6 on 08/22/2015.  She is s/p partial hysterectomy.  She is premenopausal ([place] 5 and estradiol 271.9) on 08/22/2015.  Echo on 08/26/2015 revealed an ejection fraction of 55-60%.  Echo on 11/25/2015 revealed an EF of 60-65%.  She has a family history of breast and ovarian cancer.  My Risk genetic testing revealed no mutation on 08/22/2015.  She received 4 cycles of AC (09/13/2015 - 11/08/2015) with Neulasta support. Right sided mammogram and ultrasound on 11/25/2015 revealed extensive disease throughout the right breast. By ultrasound, the central mass was slightly smaller (4.1 x 2.3 x 4 cm to 3.1 x 1.0 x 2.4 cm) but with increased nipple retraction. An adjacent nodule was slightly larger (1.2 x 1.0 x 1.3 cm to 1.3 x 1.1 x 1.2 cm).  The extent of calcifications was larger, measuring at least 3.8 x 3.1 x 8.1 cm.  She is s/p cycle #1 Herceptin and Perjeta (11/29/2015) and week #3 of Taxol (11/29/2015 - 12/13/2015).  She is tolerating her chemotherapy well.  She has a grade I neuropathy (stable).  Symptomatically, she is feeling better.  Her fingertips and feet feel cool.  Exam reveals a smaller central right breast mass.  Plan: 1.   Labs today:  CBC with diff, CMP, Mg. 2.  Cycle #2  Herceptin and Perjeta. 3.  Week #4 Taxol today. 4.  Continue weekly labs and Taxol. 5.  Schedule right breast ultrasound on 01/06/2016. 6.  RTC in 3 weeks for MD assessment, labs (CBC with diff, CMP, Mg), review of interval breast ultrasound, and cycle #3 Herceptin and Perjeta and week #7 Taxol.   Lequita Asal, MD  12/20/2015, 9:47 AM

## 2015-12-23 ENCOUNTER — Other Ambulatory Visit: Payer: Self-pay

## 2015-12-23 DIAGNOSIS — C50811 Malignant neoplasm of overlapping sites of right female breast: Secondary | ICD-10-CM

## 2015-12-26 ENCOUNTER — Telehealth: Payer: Self-pay | Admitting: *Deleted

## 2015-12-26 NOTE — Telephone Encounter (Signed)
Per Dr Mike Gip need to see pt before her treatment tomorrow and do not move her chemo appt, just add pt to schedule. Lab appt moved to allow for md visit and pt notified to be here at 9 for lab, 915 for md and keep chemo appt same. She has agredd to [employer] plan

## 2015-12-26 NOTE — Telephone Encounter (Signed)
Called to report that she noted yesterday when she went out in the cold that she is having difficulty taking a deep breath and that her chest feels "tight" since her last treatment. States she is for treatment again tomorrow

## 2015-12-27 ENCOUNTER — Ambulatory Visit
Admission: RE | Admit: 2015-12-27 | Discharge: 2015-12-27 | Disposition: A | Discharge status: Home / Self Care | Payer: 59 | Source: Ambulatory Visit | Attending: Hematology and Oncology | Admitting: Hematology and Oncology

## 2015-12-27 ENCOUNTER — Other Ambulatory Visit: Payer: Self-pay | Admitting: Hematology and Oncology

## 2015-12-27 ENCOUNTER — Inpatient Hospital Stay: Payer: 59

## 2015-12-27 ENCOUNTER — Inpatient Hospital Stay (HOSPITAL_BASED_OUTPATIENT_CLINIC_OR_DEPARTMENT_OTHER): Payer: 59 | Admitting: Hematology and Oncology

## 2015-12-27 VITALS — BP 142/81 | HR 98 | Resp 18 | Ht 61.0 in | Wt 109.5 lb

## 2015-12-27 DIAGNOSIS — Z809 Family history of malignant neoplasm, unspecified: Secondary | ICD-10-CM

## 2015-12-27 DIAGNOSIS — Z803 Family history of malignant neoplasm of breast: Secondary | ICD-10-CM

## 2015-12-27 DIAGNOSIS — R040 Epistaxis: Secondary | ICD-10-CM | POA: Diagnosis not present

## 2015-12-27 DIAGNOSIS — Z8041 Family history of malignant neoplasm of ovary: Secondary | ICD-10-CM | POA: Diagnosis not present

## 2015-12-27 DIAGNOSIS — R0602 Shortness of breath: Secondary | ICD-10-CM | POA: Diagnosis not present

## 2015-12-27 DIAGNOSIS — Z79899 Other long term (current) drug therapy: Secondary | ICD-10-CM

## 2015-12-27 DIAGNOSIS — F418 Other specified anxiety disorders: Secondary | ICD-10-CM

## 2015-12-27 DIAGNOSIS — R32 Unspecified urinary incontinence: Secondary | ICD-10-CM | POA: Diagnosis not present

## 2015-12-27 DIAGNOSIS — C50811 Malignant neoplasm of overlapping sites of right female breast: Secondary | ICD-10-CM

## 2015-12-27 DIAGNOSIS — C773 Secondary and unspecified malignant neoplasm of axilla and upper limb lymph nodes: Secondary | ICD-10-CM

## 2015-12-27 DIAGNOSIS — G629 Polyneuropathy, unspecified: Secondary | ICD-10-CM

## 2015-12-27 DIAGNOSIS — R0789 Other chest pain: Secondary | ICD-10-CM

## 2015-12-27 DIAGNOSIS — R918 Other nonspecific abnormal finding of lung field: Secondary | ICD-10-CM | POA: Insufficient documentation

## 2015-12-27 DIAGNOSIS — D242 Benign neoplasm of left breast: Secondary | ICD-10-CM

## 2015-12-27 DIAGNOSIS — Z5111 Encounter for antineoplastic chemotherapy: Secondary | ICD-10-CM | POA: Diagnosis not present

## 2015-12-27 DIAGNOSIS — Z170 Estrogen receptor positive status [ER+]: Secondary | ICD-10-CM | POA: Diagnosis not present

## 2015-12-27 DIAGNOSIS — C50111 Malignant neoplasm of central portion of right female breast: Secondary | ICD-10-CM

## 2015-12-27 DIAGNOSIS — Z862 Personal history of diseases of the blood and blood-forming organs and certain disorders involving the immune mechanism: Secondary | ICD-10-CM

## 2015-12-27 DIAGNOSIS — Z87891 Personal history of nicotine dependence: Secondary | ICD-10-CM

## 2015-12-27 LAB — COMPREHENSIVE METABOLIC PANEL
ALT: 36 U/L (ref 14–54)
AST: 27 U/L (ref 15–41)
Albumin: 4 g/dL (ref 3.5–5.0)
Alkaline Phosphatase: 90 U/L (ref 38–126)
Anion gap: 7 (ref 5–15)
BUN: 14 mg/dL (ref 6–20)
CO2: 25 mmol/L (ref 22–32)
Calcium: 9 mg/dL (ref 8.9–10.3)
Chloride: 105 mmol/L (ref 101–111)
Creatinine, Ser: 0.58 mg/dL (ref 0.44–1.00)
GFR calc Af Amer: >60 mL/min (ref >60)
GFR calc non Af Amer: >60 mL/min (ref >60)
Glucose, Bld: 103 mg/dL — ABNORMAL HIGH (ref 65–99)
Potassium: 3.7 mmol/L (ref 3.5–5.1)
Sodium: 137 mmol/L (ref 135–145)
Total Bilirubin: 0.7 mg/dL (ref 0.3–1.2)
Total Protein: 7.1 g/dL (ref 6.5–8.1)

## 2015-12-27 LAB — CBC WITH DIFFERENTIAL/PLATELET
Basophils Absolute: 0 10*3/uL (ref 0–0.1)
Basophils Relative: 0 %
Eosinophils Absolute: 0.2 10*3/uL (ref 0–0.7)
Eosinophils Relative: 3 %
HCT: 32.7 % — ABNORMAL LOW (ref 35.0–47.0)
Hemoglobin: 11.3 g/dL — ABNORMAL LOW (ref 12.0–16.0)
Lymphocytes Relative: 15 %
Lymphs Abs: 1 10*3/uL (ref 1.0–3.6)
MCH: 33 pg (ref 26.0–34.0)
MCHC: 34.5 g/dL (ref 32.0–36.0)
MCV: 95.7 fL (ref 80.0–100.0)
Monocytes Absolute: 0.4 10*3/uL (ref 0.2–0.9)
Monocytes Relative: 5 %
Neutro Abs: 5.2 10*3/uL (ref 1.4–6.5)
Neutrophils Relative %: 77 %
Platelets: 374 10*3/uL (ref 150–440)
RBC: 3.41 MIL/uL — ABNORMAL LOW (ref 3.80–5.20)
RDW: 14 % (ref 11.5–14.5)
WBC: 6.8 10*3/uL (ref 3.6–11.0)

## 2015-12-27 LAB — MAGNESIUM: Magnesium: 1.9 mg/dL (ref 1.7–2.4)

## 2015-12-27 MED ORDER — SODIUM CHLORIDE 0.9 % IV SOLN
20.0000 mg | Freq: Once | INTRAVENOUS | Status: AC
  Administered 2015-12-27: 20 mg via INTRAVENOUS
  Filled 2015-12-27: qty 2

## 2015-12-27 MED ORDER — SODIUM CHLORIDE 0.9% FLUSH
10.0000 mL | INTRAVENOUS | Status: DC | PRN
  Administered 2015-12-27 (×2): 10 mL
  Filled 2015-12-27: qty 10

## 2015-12-27 MED ORDER — DEXTROSE 5 % IV SOLN
80.0000 mg/m2 | Freq: Once | INTRAVENOUS | Status: AC
  Administered 2015-12-27: 120 mg via INTRAVENOUS
  Filled 2015-12-27: qty 20

## 2015-12-27 MED ORDER — HEPARIN SOD (PORK) LOCK FLUSH 100 UNIT/ML IV SOLN
500.0000 [IU] | Freq: Once | INTRAVENOUS | Status: AC | PRN
  Administered 2015-12-27: 500 [IU]
  Filled 2015-12-27: qty 5

## 2015-12-27 MED ORDER — SODIUM CHLORIDE 0.9 % IV SOLN
Freq: Once | INTRAVENOUS | Status: AC
  Administered 2015-12-27: 13:00:00 via INTRAVENOUS
  Filled 2015-12-27: qty 1000

## 2015-12-27 MED ORDER — IOHEXOL 350 MG/ML SOLN
75.0000 mL | Freq: Once | INTRAVENOUS | Status: AC | PRN
  Administered 2015-12-27: 75 mL via INTRAVENOUS

## 2015-12-27 MED ORDER — DIPHENHYDRAMINE HCL 50 MG/ML IJ SOLN
50.0000 mg | Freq: Once | INTRAMUSCULAR | Status: AC
  Administered 2015-12-27: 50 mg via INTRAVENOUS
  Filled 2015-12-27: qty 1

## 2015-12-27 MED ORDER — FAMOTIDINE IN NACL 20-0.9 MG/50ML-% IV SOLN
20.0000 mg | Freq: Once | INTRAVENOUS | Status: AC
Start: 2015-12-27 — End: 2015-12-27
  Administered 2015-12-27: 20 mg via INTRAVENOUS
  Filled 2015-12-27: qty 50

## 2015-12-27 NOTE — Progress Notes (Signed)
Emma Atkinson is a 50 y.o. female with clinical stage IIB Her2/neu+ right breast cancer who is seen for sick call visit on prior to week #5 neoadjuvant Taxol.  HPI:  The patient was last seen in the medical oncology clinic on 12/20/2015.  At that time, she was feeling better.  Her fingertips and feet felt cool.  Exam revealed a smaller central right breast mass  She received cycle #2 Herceptin, Perjeta, and week # 4 Taxol.    During the interim, she notes the sensation of chest tightness when taking a deep breath or walking around.  She notes a little congestion.  She denies any productive cough or fever.  She denies any lower extremity edema or pleuritic chest pain.  She has been fatigued this week.  She had a nose bleed this am.  She has gained 1 1/2 pounds.  Neuropathy is stable.  Past Medical History  Diagnosis Date  . Breast cancer ([place])   . Anxiety   . Depression   . Anemia     H/O  . Complication of anesthesia     HARD TO WAKE UP AFTER TONSILLECTOMY AS A CHILD BUT NO PROBLEMS SINCE    Past Surgical History  Procedure Laterality Date  . Breast biopsy Bilateral     08/17/2015   . Abdominal hysterectomy  2001  . Tonsillectomy    . Back surgery      FOR SCOLIOSIS-HERRINGTON RODS IN PLACE  . Portacath placement N/A 08/30/2015    Procedure: INSERTION PORT-A-CATH;  Surgeon: Leonie Green, MD;  Location: [facility] ORS;  Service: General;  Laterality: N/A;    Family History  Problem Relation Age of Onset  . Breast cancer Maternal Aunt 64  . Cancer Father   . Cancer Sister   . Cancer Maternal Grandfather     Social History:  reports that she quit smoking about 16 years ago. Her smoking use included Cigarettes. She has a 10 pack-year smoking history. She does not have any smokeless tobacco history on file. She reports that she drinks alcohol. She reports that she does not use illicit  drugs.  The patient is accompanied by her husband today.  Allergies:  Allergies  Allergen Reactions  . Ceftin [Cefuroxime Axetil] Rash    Current Medications: Current Outpatient Prescriptions  Medication Sig Dispense Refill  . ALPRAZolam (XANAX) 0.25 MG tablet Take 1/4 tablet up to three times daily as needed    . Dexlansoprazole (DEXILANT) 30 MG capsule Take 30 mg by mouth daily.    . diphenhydrAMINE (SOMINEX) 25 MG tablet Take 25 mg by mouth at bedtime.     . lidocaine-prilocaine (EMLA) cream Apply 1 application topically as needed. Apply at least 1-2 hours before the port is to be used. 30 g 1  . LORazepam (ATIVAN) 0.5 MG tablet Take 1 tablet (0.5 mg total) by mouth every 6 (six) hours as needed (Nausea or vomiting). 30 tablet 0  . Multiple Vitamin (MULTIVITAMIN) capsule Take 1 capsule by mouth daily.     No current facility-administered medications for this visit.    Review of Systems:  GENERAL:  Fatigue.  No fevers or sweats.  Weight up 1.5 pounds. PERFORMANCE STATUS (ECOG):  1 HEENT:  No visual changes, runny nose, sore throat, mouth sores or tenderness. Lungs: Little congestion.  Chest tightness when walking around.  No pleuritic chest pain.  No hemoptysis. Cardiac:  No chest pain, palpitations, orthopnea, or PND. GI:  Appetite good.  No nausea, vomiting, diarrhea, constipation, melena or hematochezia. GU:  No urgency, frequency, dysuria, or hematuria. Musculoskeletal:  No back pain.  No joint pain.  No muscle tenderness. Extremities:  No pain or swelling. Skin:  No rashes or skin changes. Neuro:  Fingertips and feet feel cold.  No tingling or burning.  No weakness, balance or coordination issues. Endocrine:  No diabetes, thyroid issues, hot flashes or night sweats. Psych:  No mood changes or depression.  Poor sleep. Pain:  No focal pain. Review of systems:  All other systems reviewed and found to be negative.  Physical Exam: Blood pressure 142/81, pulse 98, resp. rate  18, height '5\' 1"'  (1.549 m), weight 109 lb 7.3 oz (49.65 kg), SpO2 100 %. O2 sats 94% with ambulation. GENERAL:  Thin woman sitting comfortably in the exam room in no acute distress.  MENTAL STATUS:  Alert and oriented to person, place and time. HEAD:  Wearing a cap.  Alopecia.  Normocephalic, atraumatic, face symmetric (thin), no Cushingoid features. EYES:  Blue eyes.  Pupils equal round and reactive to light and accomodation.  No conjunctivitis or scleral icterus. ENT:  Oropharynx clear without lesion.  Tongue normal. Mucous membranes moist.  RESPIRATORY:  Rare squeak in RLL.  Clear to auscultation without rales, wheezes or rhonchi. CARDIOVASCULAR:  Regular rate and rhythm without murmur, rub or gallop. No JVD. BREAST:  Right breast with central breast mass (smaller) with nipple deformity.  ABDOMEN:  Soft, non-tender, with active bowel sounds, and no hepatosplenomegaly.  No masses. SKIN:  No rashes, ulcers or lesions. EXTREMITIES: Warm.  No edema, no skin discoloration or tenderness.  No palpable cords. LYMPH NODES: No palpable cervical, supraclavicular, axillary or inguinal adenopathy  NEUROLOGICAL: Unremarkable. PSYCH:  Appropriate.   Infusion on 12/27/2015  Component Date Value Ref Range Status  . WBC 12/27/2015 6.8  3.6 - 11.0 K/uL Final  . RBC 12/27/2015 3.41* 3.80 - 5.20 MIL/uL Final  . Hemoglobin 12/27/2015 11.3* 12.0 - 16.0 g/dL Final  . HCT 12/27/2015 32.7* 35.0 - 47.0 % Final  . MCV 12/27/2015 95.7  80.0 - 100.0 fL Final  . MCH 12/27/2015 33.0  26.0 - 34.0 pg Final  . MCHC 12/27/2015 34.5  32.0 - 36.0 g/dL Final  . RDW 12/27/2015 14.0  11.5 - 14.5 % Final  . Platelets 12/27/2015 374  150 - 440 K/uL Final  . Neutrophils Relative % 12/27/2015 77   Final  . Neutro Abs 12/27/2015 5.2  1.4 - 6.5 K/uL Final  . Lymphocytes Relative 12/27/2015 15   Final  . Lymphs Abs 12/27/2015 1.0  1.0 - 3.6 K/uL Final  . Monocytes Relative 12/27/2015 5   Final  . Monocytes Absolute 12/27/2015  0.4  0.2 - 0.9 K/uL Final  . Eosinophils Relative 12/27/2015 3   Final  . Eosinophils Absolute 12/27/2015 0.2  0 - 0.7 K/uL Final  . Basophils Relative 12/27/2015 0   Final  . Basophils Absolute 12/27/2015 0.0  0 - 0.1 K/uL Final  . Sodium 12/27/2015 137  135 - 145 mmol/L Final  . Potassium 12/27/2015 3.7  3.5 - 5.1 mmol/L Final  . Chloride 12/27/2015 105  101 - 111 mmol/L Final  . CO2 12/27/2015 25  22 - 32 mmol/L Final  . Glucose, Bld 12/27/2015 103* 65 - 99 mg/dL Final  . BUN 12/27/2015 14  6 - 20 mg/dL Final  . Creatinine, Ser 12/27/2015 0.58  0.44 -  1.00 mg/dL Final  . Calcium 12/27/2015 9.0  8.9 - 10.3 mg/dL Final  . Total Protein 12/27/2015 7.1  6.5 - 8.1 g/dL Final  . Albumin 12/27/2015 4.0  3.5 - 5.0 g/dL Final  . AST 12/27/2015 27  15 - 41 U/L Final  . ALT 12/27/2015 36  14 - 54 U/L Final  . Alkaline Phosphatase 12/27/2015 90  38 - 126 U/L Final  . Total Bilirubin 12/27/2015 0.7  0.3 - 1.2 mg/dL Final  . GFR calc non Af Amer 12/27/2015 >60  >60 mL/min Final  . GFR calc Af Amer 12/27/2015 >60  >60 mL/min Final   Comment: (NOTE) The eGFR has been calculated using the CKD EPI equation. This calculation has not been validated in all clinical situations. eGFR's persistently <60 mL/min signify possible Chronic Kidney Disease.   . Anion gap 12/27/2015 7  5 - 15 Final  . Magnesium 12/27/2015 1.9  1.7 - 2.4 mg/dL Final    Assessment:  BRONWYN BELASCO is a 50 y.o. female with clinical stage T2N1 (stage IIB) Her2/neu + right breast cancer status post biopsy.  She presented with a minimally painful right breast mass with nipple inversion.  Bilateral mammogram and ultrasound on 08/12/2015 revealed a 4.1 cm right superior breast mass containing pleomorphic microcalcifications. There were several satellite lesions (1.3 cm, 0.9 cm, 0.6 cm) plus a 1.3 cm mass versus complicated cyst and a 0.8 cm cyst.  There was one 5 mm right axillary node with irregular thickened cortex.   Left breast  revealed a 1.5 cm cyst containing intramural nodule versus debris at the 9:00 position 2 cm from the nipple. There was also an indeterminate left breast 7 mm nodule at the 2:00 position 4 cm from the nipple.  Left breast biopsy at the 9:00 position on 08/17/2015 revealed intraductal papilloma with fibrocystic changes.   Core needle biopsy on 08/17/2015 of the right breast mass revealed a grade 3 invasive mammary carcinoma. Right axillary biopsy confirmed metastatic mammary carcinoma. Tumor was ER positive (1-10%), PR positive (11-50%) and HER-2/neu 3+  PET scan on 08/26/2015 revealed hypermetabolic right breast mass (SUV 11.3) with a hypermetabolic right axillary lymph node and a subtle focus of very faint hypermetabolic activity laterally in the right breast. There was faint hypermetabolic activity at the biopsy site of the left breast (? biopsy related).  CA27.29 was 19.6 on 08/22/2015.  She is s/p partial hysterectomy.  She is premenopausal ([place] 5 and estradiol 271.9) on 08/22/2015.  Echo on 08/26/2015 revealed an ejection fraction of 55-60%.  Echo on 11/25/2015 revealed an EF of 60-65%.  She has a family history of breast and ovarian cancer.  My Risk genetic testing revealed no mutation on 08/22/2015.  She received 4 cycles of AC (09/13/2015 - 11/08/2015) with Neulasta support. Right sided mammogram and ultrasound on 11/25/2015 revealed extensive disease throughout the right breast. By ultrasound, the central mass was slightly smaller (4.1 x 2.3 x 4 cm to 3.1 x 1.0 x 2.4 cm) but with increased nipple retraction. An adjacent nodule was slightly larger (1.2 x 1.0 x 1.3 cm to 1.3 x 1.1 x 1.2 cm).  The extent of calcifications was larger, measuring at least 3.8 x 3.1 x 8.1 cm.  She is s/p 2 cycles of Herceptin and Perjeta (11/29/2015 - 12/20/2015) and week #4 of Taxol (11/29/2015 - 12/20/2015).  She is tolerating her chemotherapy well.  She has a grade I neuropathy (stable).  She has a 2 day history of  chest tightness when taking a deep breath or walking around.  She denies any pleuritic chest pain. Exam reveals a rare squeak in the right lower lobe.   Plan: 1.  Labs today:  CBC with diff, CMP, Mg. 2.  Chest CT angiogram. 3.  Week #5 Taxol today. 4.  Continue weekly labs and Taxol. 5.  Anticipate right breast ultrasound on 01/06/2016. 6.  RTC on 01/10/2016  for MD assessment, labs (CBC with diff, CMP, Mg), review of interval breast ultrasound, and cycle #3 Herceptin and Perjeta and week #7 Taxol.  Addendum:  Chest CT angiogram reveals no evidence of pulmonary embolism.  There were minor anterior bilateral upper lobe patchy and subpleural ground-glass opacities, nonspecific, suspect mild scattered alveolitis.  Findings discussed with the patient.  She will be referred to pulmonary medicine.   Lequita Asal, MD  12/27/2015, 8:48 AM

## 2015-12-28 ENCOUNTER — Encounter: Payer: Self-pay | Admitting: Hematology and Oncology

## 2015-12-29 ENCOUNTER — Other Ambulatory Visit: Payer: Self-pay

## 2015-12-29 DIAGNOSIS — C50811 Malignant neoplasm of overlapping sites of right female breast: Secondary | ICD-10-CM

## 2015-12-31 ENCOUNTER — Other Ambulatory Visit: Payer: Self-pay | Admitting: Hematology and Oncology

## 2016-01-03 ENCOUNTER — Inpatient Hospital Stay: Payer: 59

## 2016-01-03 VITALS — BP 130/81 | HR 78

## 2016-01-03 DIAGNOSIS — C50811 Malignant neoplasm of overlapping sites of right female breast: Secondary | ICD-10-CM | POA: Diagnosis not present

## 2016-01-03 DIAGNOSIS — C50111 Malignant neoplasm of central portion of right female breast: Secondary | ICD-10-CM

## 2016-01-03 DIAGNOSIS — Z809 Family history of malignant neoplasm, unspecified: Secondary | ICD-10-CM | POA: Diagnosis not present

## 2016-01-03 DIAGNOSIS — R918 Other nonspecific abnormal finding of lung field: Secondary | ICD-10-CM | POA: Diagnosis not present

## 2016-01-03 DIAGNOSIS — Z170 Estrogen receptor positive status [ER+]: Secondary | ICD-10-CM | POA: Diagnosis not present

## 2016-01-03 DIAGNOSIS — R32 Unspecified urinary incontinence: Secondary | ICD-10-CM | POA: Diagnosis not present

## 2016-01-03 DIAGNOSIS — Z803 Family history of malignant neoplasm of breast: Secondary | ICD-10-CM | POA: Diagnosis not present

## 2016-01-03 DIAGNOSIS — Z8041 Family history of malignant neoplasm of ovary: Secondary | ICD-10-CM | POA: Diagnosis not present

## 2016-01-03 DIAGNOSIS — C773 Secondary and unspecified malignant neoplasm of axilla and upper limb lymph nodes: Secondary | ICD-10-CM | POA: Diagnosis not present

## 2016-01-03 DIAGNOSIS — Z5111 Encounter for antineoplastic chemotherapy: Secondary | ICD-10-CM | POA: Diagnosis not present

## 2016-01-03 LAB — CBC WITH DIFFERENTIAL/PLATELET
Basophils Absolute: 0 10*3/uL (ref 0–0.1)
Basophils Relative: 0 %
Eosinophils Absolute: 0.2 10*3/uL (ref 0–0.7)
Eosinophils Relative: 2 %
HCT: 33 % — ABNORMAL LOW (ref 35.0–47.0)
Hemoglobin: 11.4 g/dL — ABNORMAL LOW (ref 12.0–16.0)
Lymphocytes Relative: 9 %
Lymphs Abs: 0.8 10*3/uL — ABNORMAL LOW (ref 1.0–3.6)
MCH: 32.8 pg (ref 26.0–34.0)
MCHC: 34.4 g/dL (ref 32.0–36.0)
MCV: 95.2 fL (ref 80.0–100.0)
Monocytes Absolute: 0.4 10*3/uL (ref 0.2–0.9)
Monocytes Relative: 5 %
Neutro Abs: 7.7 10*3/uL — ABNORMAL HIGH (ref 1.4–6.5)
Neutrophils Relative %: 84 %
Platelets: 356 10*3/uL (ref 150–440)
RBC: 3.47 MIL/uL — ABNORMAL LOW (ref 3.80–5.20)
RDW: 13.3 % (ref 11.5–14.5)
WBC: 9.1 10*3/uL (ref 3.6–11.0)

## 2016-01-03 LAB — COMPREHENSIVE METABOLIC PANEL
ALT: 30 U/L (ref 14–54)
AST: 20 U/L (ref 15–41)
Albumin: 4 g/dL (ref 3.5–5.0)
Alkaline Phosphatase: 83 U/L (ref 38–126)
Anion gap: 5 (ref 5–15)
BUN: 12 mg/dL (ref 6–20)
CO2: 25 mmol/L (ref 22–32)
Calcium: 8.9 mg/dL (ref 8.9–10.3)
Chloride: 104 mmol/L (ref 101–111)
Creatinine, Ser: 0.65 mg/dL (ref 0.44–1.00)
GFR calc Af Amer: >60 mL/min (ref >60)
GFR calc non Af Amer: >60 mL/min (ref >60)
Glucose, Bld: 117 mg/dL — ABNORMAL HIGH (ref 65–99)
Potassium: 3.8 mmol/L (ref 3.5–5.1)
Sodium: 134 mmol/L — ABNORMAL LOW (ref 135–145)
Total Bilirubin: 0.5 mg/dL (ref 0.3–1.2)
Total Protein: 6.8 g/dL (ref 6.5–8.1)

## 2016-01-03 LAB — MAGNESIUM: Magnesium: 1.9 mg/dL (ref 1.7–2.4)

## 2016-01-03 MED ORDER — DIPHENHYDRAMINE HCL 50 MG/ML IJ SOLN
50.0000 mg | Freq: Once | INTRAMUSCULAR | Status: AC
  Administered 2016-01-03: 50 mg via INTRAVENOUS
  Filled 2016-01-03: qty 1

## 2016-01-03 MED ORDER — PACLITAXEL CHEMO INJECTION 300 MG/50ML
80.0000 mg/m2 | Freq: Once | INTRAVENOUS | Status: AC
  Administered 2016-01-03: 120 mg via INTRAVENOUS
  Filled 2016-01-03: qty 20

## 2016-01-03 MED ORDER — FAMOTIDINE IN NACL 20-0.9 MG/50ML-% IV SOLN
20.0000 mg | Freq: Once | INTRAVENOUS | Status: AC
  Administered 2016-01-03: 20 mg via INTRAVENOUS
  Filled 2016-01-03: qty 50

## 2016-01-03 MED ORDER — SODIUM CHLORIDE 0.9 % IV SOLN
Freq: Once | INTRAVENOUS | Status: AC
  Administered 2016-01-03: 11:00:00 via INTRAVENOUS
  Filled 2016-01-03: qty 1000

## 2016-01-03 MED ORDER — HEPARIN SOD (PORK) LOCK FLUSH 100 UNIT/ML IV SOLN
500.0000 [IU] | Freq: Once | INTRAVENOUS | Status: AC | PRN
  Administered 2016-01-03: 500 [IU]
  Filled 2016-01-03: qty 5

## 2016-01-03 MED ORDER — SODIUM CHLORIDE 0.9 % IV SOLN
20.0000 mg | Freq: Once | INTRAVENOUS | Status: AC
  Administered 2016-01-03: 20 mg via INTRAVENOUS
  Filled 2016-01-03: qty 2

## 2016-01-06 ENCOUNTER — Other Ambulatory Visit: Payer: Self-pay | Admitting: Hematology and Oncology

## 2016-01-06 ENCOUNTER — Ambulatory Visit
Admission: RE | Admit: 2016-01-06 | Discharge: 2016-01-06 | Disposition: A | Discharge status: Home / Self Care | Payer: 59 | Source: Ambulatory Visit | Attending: Hematology and Oncology | Admitting: Hematology and Oncology

## 2016-01-06 DIAGNOSIS — C50111 Malignant neoplasm of central portion of right female breast: Secondary | ICD-10-CM | POA: Insufficient documentation

## 2016-01-06 DIAGNOSIS — N63 Unspecified lump in breast: Secondary | ICD-10-CM | POA: Diagnosis not present

## 2016-01-09 ENCOUNTER — Other Ambulatory Visit: Payer: Self-pay | Admitting: *Deleted

## 2016-01-09 DIAGNOSIS — C50111 Malignant neoplasm of central portion of right female breast: Secondary | ICD-10-CM

## 2016-01-10 ENCOUNTER — Inpatient Hospital Stay (HOSPITAL_BASED_OUTPATIENT_CLINIC_OR_DEPARTMENT_OTHER): Payer: 59 | Admitting: Hematology and Oncology

## 2016-01-10 ENCOUNTER — Inpatient Hospital Stay: Payer: 59

## 2016-01-10 ENCOUNTER — Encounter: Payer: Self-pay | Admitting: *Deleted

## 2016-01-10 VITALS — BP 124/88 | HR 99 | Temp 98.4°F | Resp 16 | Wt 107.6 lb

## 2016-01-10 DIAGNOSIS — Z5111 Encounter for antineoplastic chemotherapy: Secondary | ICD-10-CM | POA: Diagnosis not present

## 2016-01-10 DIAGNOSIS — R32 Unspecified urinary incontinence: Secondary | ICD-10-CM | POA: Diagnosis not present

## 2016-01-10 DIAGNOSIS — Z87891 Personal history of nicotine dependence: Secondary | ICD-10-CM

## 2016-01-10 DIAGNOSIS — Z8041 Family history of malignant neoplasm of ovary: Secondary | ICD-10-CM | POA: Diagnosis not present

## 2016-01-10 DIAGNOSIS — Z79899 Other long term (current) drug therapy: Secondary | ICD-10-CM

## 2016-01-10 DIAGNOSIS — Z803 Family history of malignant neoplasm of breast: Secondary | ICD-10-CM

## 2016-01-10 DIAGNOSIS — Z809 Family history of malignant neoplasm, unspecified: Secondary | ICD-10-CM | POA: Diagnosis not present

## 2016-01-10 DIAGNOSIS — N39498 Other specified urinary incontinence: Secondary | ICD-10-CM

## 2016-01-10 DIAGNOSIS — C50811 Malignant neoplasm of overlapping sites of right female breast: Secondary | ICD-10-CM

## 2016-01-10 DIAGNOSIS — G629 Polyneuropathy, unspecified: Secondary | ICD-10-CM

## 2016-01-10 DIAGNOSIS — C773 Secondary and unspecified malignant neoplasm of axilla and upper limb lymph nodes: Secondary | ICD-10-CM | POA: Diagnosis not present

## 2016-01-10 DIAGNOSIS — F418 Other specified anxiety disorders: Secondary | ICD-10-CM

## 2016-01-10 DIAGNOSIS — Z862 Personal history of diseases of the blood and blood-forming organs and certain disorders involving the immune mechanism: Secondary | ICD-10-CM

## 2016-01-10 DIAGNOSIS — R918 Other nonspecific abnormal finding of lung field: Secondary | ICD-10-CM

## 2016-01-10 DIAGNOSIS — C50111 Malignant neoplasm of central portion of right female breast: Secondary | ICD-10-CM

## 2016-01-10 DIAGNOSIS — D242 Benign neoplasm of left breast: Secondary | ICD-10-CM

## 2016-01-10 DIAGNOSIS — R040 Epistaxis: Secondary | ICD-10-CM

## 2016-01-10 DIAGNOSIS — Z170 Estrogen receptor positive status [ER+]: Secondary | ICD-10-CM | POA: Diagnosis not present

## 2016-01-10 LAB — COMPREHENSIVE METABOLIC PANEL
ALT: 22 U/L (ref 14–54)
AST: 21 U/L (ref 15–41)
Albumin: 3.9 g/dL (ref 3.5–5.0)
Alkaline Phosphatase: 89 U/L (ref 38–126)
Anion gap: 8 (ref 5–15)
BUN: 10 mg/dL (ref 6–20)
CO2: 25 mmol/L (ref 22–32)
Calcium: 8.7 mg/dL — ABNORMAL LOW (ref 8.9–10.3)
Chloride: 103 mmol/L (ref 101–111)
Creatinine, Ser: 0.74 mg/dL (ref 0.44–1.00)
GFR calc Af Amer: >60 mL/min (ref >60)
GFR calc non Af Amer: >60 mL/min (ref >60)
Glucose, Bld: 103 mg/dL — ABNORMAL HIGH (ref 65–99)
Potassium: 3.8 mmol/L (ref 3.5–5.1)
Sodium: 136 mmol/L (ref 135–145)
Total Bilirubin: 0.6 mg/dL (ref 0.3–1.2)
Total Protein: 6.8 g/dL (ref 6.5–8.1)

## 2016-01-10 LAB — URINALYSIS COMPLETE WITH MICROSCOPIC (ARMC ONLY)
Bacteria, UA: NONE SEEN
Bilirubin Urine: NEGATIVE
Glucose, UA: NEGATIVE mg/dL
Ketones, ur: NEGATIVE mg/dL
Nitrite: NEGATIVE
Protein, ur: NEGATIVE mg/dL
Specific Gravity, Urine: 1.002 — ABNORMAL LOW (ref 1.005–1.030)
pH: 7 (ref 5.0–8.0)

## 2016-01-10 LAB — CBC WITH DIFFERENTIAL/PLATELET
Basophils Absolute: 0 10*3/uL (ref 0–0.1)
Basophils Relative: 0 %
Eosinophils Absolute: 0.1 10*3/uL (ref 0–0.7)
Eosinophils Relative: 2 %
HCT: 33 % — ABNORMAL LOW (ref 35.0–47.0)
Hemoglobin: 11.2 g/dL — ABNORMAL LOW (ref 12.0–16.0)
Lymphocytes Relative: 8 %
Lymphs Abs: 0.7 10*3/uL — ABNORMAL LOW (ref 1.0–3.6)
MCH: 32.1 pg (ref 26.0–34.0)
MCHC: 33.9 g/dL (ref 32.0–36.0)
MCV: 94.5 fL (ref 80.0–100.0)
Monocytes Absolute: 0.5 10*3/uL (ref 0.2–0.9)
Monocytes Relative: 6 %
Neutro Abs: 7.8 10*3/uL — ABNORMAL HIGH (ref 1.4–6.5)
Neutrophils Relative %: 84 %
Platelets: 409 10*3/uL (ref 150–440)
RBC: 3.49 MIL/uL — ABNORMAL LOW (ref 3.80–5.20)
RDW: 13.5 % (ref 11.5–14.5)
WBC: 9.2 10*3/uL (ref 3.6–11.0)

## 2016-01-10 LAB — MAGNESIUM: Magnesium: 1.9 mg/dL (ref 1.7–2.4)

## 2016-01-10 MED ORDER — SODIUM CHLORIDE 0.9 % IV SOLN
20.0000 mg | Freq: Once | INTRAVENOUS | Status: AC
  Administered 2016-01-10: 20 mg via INTRAVENOUS
  Filled 2016-01-10: qty 2

## 2016-01-10 MED ORDER — DIPHENHYDRAMINE HCL 50 MG/ML IJ SOLN
50.0000 mg | Freq: Once | INTRAMUSCULAR | Status: AC
  Administered 2016-01-10: 50 mg via INTRAVENOUS
  Filled 2016-01-10: qty 1

## 2016-01-10 MED ORDER — FAMOTIDINE IN NACL 20-0.9 MG/50ML-% IV SOLN
20.0000 mg | Freq: Once | INTRAVENOUS | Status: AC
  Administered 2016-01-10: 20 mg via INTRAVENOUS
  Filled 2016-01-10: qty 50

## 2016-01-10 MED ORDER — HEPARIN SOD (PORK) LOCK FLUSH 100 UNIT/ML IV SOLN
500.0000 [IU] | Freq: Once | INTRAVENOUS | Status: AC
  Administered 2016-01-10: 500 [IU] via INTRAVENOUS
  Filled 2016-01-10: qty 5

## 2016-01-10 MED ORDER — SODIUM CHLORIDE 0.9% FLUSH
10.0000 mL | INTRAVENOUS | Status: DC | PRN
  Administered 2016-01-10: 10 mL via INTRAVENOUS
  Filled 2016-01-10: qty 10

## 2016-01-10 MED ORDER — ACETAMINOPHEN 325 MG PO TABS
650.0000 mg | ORAL_TABLET | Freq: Once | ORAL | Status: AC
  Administered 2016-01-10: 650 mg via ORAL
  Filled 2016-01-10: qty 2

## 2016-01-10 MED ORDER — PACLITAXEL CHEMO INJECTION 300 MG/50ML
80.0000 mg/m2 | Freq: Once | INTRAVENOUS | Status: AC
  Administered 2016-01-10: 120 mg via INTRAVENOUS
  Filled 2016-01-10: qty 20

## 2016-01-10 MED ORDER — TRASTUZUMAB CHEMO INJECTION 440 MG
6.0000 mg/kg | Freq: Once | INTRAVENOUS | Status: AC
  Administered 2016-01-10: 294 mg via INTRAVENOUS
  Filled 2016-01-10: qty 14

## 2016-01-10 MED ORDER — SODIUM CHLORIDE 0.9 % IV SOLN
Freq: Once | INTRAVENOUS | Status: AC
  Administered 2016-01-10: 10:00:00 via INTRAVENOUS
  Filled 2016-01-10: qty 1000

## 2016-01-10 MED ORDER — SODIUM CHLORIDE 0.9 % IV SOLN
420.0000 mg | Freq: Once | INTRAVENOUS | Status: AC
  Administered 2016-01-10: 420 mg via INTRAVENOUS
  Filled 2016-01-10: qty 14

## 2016-01-10 NOTE — Progress Notes (Signed)
Patient has new symptom of urinary incontinence with feeling bladder pressure.  Also has vaginal burning and redness that feels like a yeast infection.  Her neuropathy is slightly worse.  Has lost a 2 pounds with decrease in appetite but could be stress related with the recent loss of her grandmother and her daughter has moved out of state.

## 2016-01-11 ENCOUNTER — Telehealth: Payer: Self-pay | Admitting: *Deleted

## 2016-01-11 DIAGNOSIS — C50411 Malignant neoplasm of upper-outer quadrant of right female breast: Secondary | ICD-10-CM | POA: Diagnosis not present

## 2016-01-11 LAB — URINE CULTURE: Culture: 7000

## 2016-01-11 LAB — CANCER ANTIGEN 27.29: CA 27.29: 24.7 U/mL (ref 0.0–38.6)

## 2016-01-11 NOTE — Telephone Encounter (Signed)
Called and left message on voicemail that the tumor marker drawn yest. Was in normal limits.

## 2016-01-11 NOTE — Telephone Encounter (Signed)
-----   Message from Lequita Asal, MD sent at 01/11/2016  4:09 AM EDT ----- Regarding: Tumor marker  Please notify patient that marker is normal.  Thanks,  M  ----- Message -----    From: [facility] [place]: 01/10/2016   8:53 AM      To: Lequita Asal, MD

## 2016-01-12 ENCOUNTER — Other Ambulatory Visit: Payer: Self-pay | Admitting: Internal Medicine

## 2016-01-12 ENCOUNTER — Telehealth: Payer: Self-pay | Admitting: Internal Medicine

## 2016-01-12 ENCOUNTER — Encounter: Payer: Self-pay | Admitting: Family Medicine

## 2016-01-12 ENCOUNTER — Telehealth: Payer: Self-pay | Admitting: Hematology and Oncology

## 2016-01-12 ENCOUNTER — Ambulatory Visit (INDEPENDENT_AMBULATORY_CARE_PROVIDER_SITE_OTHER): Payer: 59 | Admitting: Family Medicine

## 2016-01-12 ENCOUNTER — Other Ambulatory Visit
Admission: RE | Admit: 2016-01-12 | Discharge: 2016-01-12 | Disposition: A | Discharge status: Home / Self Care | Payer: 59 | Source: Ambulatory Visit | Attending: Family Medicine | Admitting: Family Medicine

## 2016-01-12 VITALS — BP 120/70 | HR 120 | Temp 100.9°F | Ht 61.0 in | Wt 107.0 lb

## 2016-01-12 DIAGNOSIS — N760 Acute vaginitis: Secondary | ICD-10-CM

## 2016-01-12 DIAGNOSIS — R509 Fever, unspecified: Secondary | ICD-10-CM | POA: Diagnosis not present

## 2016-01-12 DIAGNOSIS — R52 Pain, unspecified: Secondary | ICD-10-CM | POA: Diagnosis not present

## 2016-01-12 DIAGNOSIS — N3090 Cystitis, unspecified without hematuria: Secondary | ICD-10-CM

## 2016-01-12 DIAGNOSIS — R0989 Other specified symptoms and signs involving the circulatory and respiratory systems: Secondary | ICD-10-CM | POA: Insufficient documentation

## 2016-01-12 LAB — CBC WITH DIFFERENTIAL/PLATELET
BASOS ABS: 0 10*3/uL (ref 0–0.1)
Basophils Relative: 0 %
EOS ABS: 0 10*3/uL (ref 0–0.7)
EOS PCT: 0 %
HCT: 31.4 % — ABNORMAL LOW (ref 35.0–47.0)
Hemoglobin: 10.4 g/dL — ABNORMAL LOW (ref 12.0–16.0)
LYMPHS PCT: 3 %
Lymphs Abs: 0.4 10*3/uL — ABNORMAL LOW (ref 1.0–3.6)
MCH: 31.8 pg (ref 26.0–34.0)
MCHC: 33.2 g/dL (ref 32.0–36.0)
MCV: 95.9 fL (ref 80.0–100.0)
MONO ABS: 0.3 10*3/uL (ref 0.2–0.9)
Monocytes Relative: 3 %
Neutro Abs: 12.4 10*3/uL — ABNORMAL HIGH (ref 1.4–6.5)
Neutrophils Relative %: 94 %
PLATELETS: 339 10*3/uL (ref 150–440)
RBC: 3.28 MIL/uL — AB (ref 3.80–5.20)
RDW: 13.7 % (ref 11.5–14.5)
WBC: 13.1 10*3/uL — AB (ref 3.6–11.0)

## 2016-01-12 LAB — POCT URINALYSIS DIPSTICK
Bilirubin, UA: NEGATIVE
Glucose, UA: NEGATIVE
KETONES UA: NEGATIVE
NITRITE UA: NEGATIVE
PH UA: 5
PROTEIN UA: NEGATIVE
Spec Grav, UA: <=1.005
UROBILINOGEN UA: 0.2

## 2016-01-12 LAB — POCT RAPID STREP A (OFFICE): RAPID STREP A SCREEN: NEGATIVE

## 2016-01-12 LAB — RAPID INFLUENZA A&B ANTIGENS (ARMC ONLY): INFLUENZA B (ARMC): NEGATIVE

## 2016-01-12 LAB — RAPID INFLUENZA A&B ANTIGENS: Influenza A (ARMC): NEGATIVE

## 2016-01-12 LAB — POCT INFLUENZA A/B
INFLUENZA B, POC: NEGATIVE
Influenza A, POC: NEGATIVE

## 2016-01-12 MED ORDER — FLUCONAZOLE 150 MG PO TABS: 150.0000 mg | ORAL_TABLET | Freq: Once | ORAL | Status: DC

## 2016-01-12 MED ORDER — CIPROFLOXACIN HCL 500 MG PO TABS: 500.0000 mg | ORAL_TABLET | Freq: Two times a day (BID) | ORAL | Status: DC

## 2016-01-12 NOTE — Telephone Encounter (Signed)
Patient's husband called back regarding fever 101 bodyaches/sinus congestion. No cough or shortness of breath. Recommend taking Tylenol. Would recommend ruling out flu- family interested in checking with PCP for flu testing.Family will call us after talking to PCPs office/Dr. Doy Hutching. If cannot go to PCPs office- options coming to the cancer center/flu testing versus going to the ER.  [place].

## 2016-01-12 NOTE — Telephone Encounter (Signed)
Dr. Ronnald Ramp has patient in her office right now and says her fever is 101.5 this morning - axillary - so probably a bit higher in actuality. She has sensitivity sensitivity - no rash. Throat is a little red, but influenza AB is negative. No adenopathy. Sinuses are a little scratchy but no tenderness over maxillaries. She wants to consult Dr. Loletha Grayer about this for fear it may be urosepsis? Please call Dr. Ronnald Ramp asap to give direction: [phone]

## 2016-01-12 NOTE — Telephone Encounter (Signed)
Reviewed previous note, was typing as Dr. Ronnald Ramp relayed info and upon review I noticed a mistake. Should say, "Superpubic sensitivity."

## 2016-01-12 NOTE — Telephone Encounter (Signed)
Dr. Mike Gip spoke to Dr. Ronnald Ramp this am and they decided a plan toegether. Dr. Ronnald Ramp started atb for UTI, and pt getting blood cultures done.

## 2016-01-12 NOTE — Progress Notes (Signed)
Name: Emma Atkinson   MRN: [id number]    DOB: 12/08/1965   Date:01/12/2016       Progress Note  Subjective  Chief Complaint  Chief Complaint  Patient presents with  . Sinusitis    scratchy throat, nasal cong, body aches, fever    Sinusitis Associated symptoms include congestion, coughing, headaches and a sore throat. Pertinent negatives include no ear pain.  Fever  This is a new problem. The current episode started today. The problem occurs constantly. The problem has been waxing and waning. The maximum temperature noted was 101 to 101.9 F. The temperature was taken using an axillary reading. Associated symptoms include congestion, coughing, diarrhea, headaches, muscle aches and a sore throat. Pertinent negatives include no abdominal pain, chest pain, ear pain, nausea, rash, sleepiness, vomiting or wheezing. Associated symptoms comments: Stridor/ treatment Tuesday/   Urinary incontinence. She has tried acetaminophen for the symptoms.    No problem-specific assessment & plan notes found for this encounter.   Past Medical History  Diagnosis Date  . Breast cancer ([place])   . Anxiety   . Depression   . Anemia     H/O  . Complication of anesthesia     HARD TO WAKE UP AFTER TONSILLECTOMY AS A CHILD BUT NO PROBLEMS SINCE    Past Surgical History  Procedure Laterality Date  . Breast biopsy Bilateral     08/17/2015   . Abdominal hysterectomy  2001  . Tonsillectomy    . Back surgery      FOR SCOLIOSIS-HERRINGTON RODS IN PLACE  . Portacath placement N/A 08/30/2015    Procedure: INSERTION PORT-A-CATH;  Surgeon: Leonie Green, MD;  Location: [facility] ORS;  Service: General;  Laterality: N/A;    Family History  Problem Relation Age of Onset  . Breast cancer Maternal Aunt 64  . Cancer Father   . Cancer Sister   . Cancer Maternal Grandfather     Social History   Social History  . Marital Status: Married    Spouse Name: N/A  . Number of Children: N/A  . Years of Education: N/A    Occupational History  . Not on file.   Social History Main Topics  . Smoking status: Former Smoker -- 1.00 packs/day for 10 years    Types: Cigarettes    Quit date: 10/16/1999  . Smokeless tobacco: Not on file  . Alcohol Use: Yes  . Drug Use: No  . Sexual Activity: Yes   Other Topics Concern  . Not on file   Social History Narrative    Allergies  Allergen Reactions  . Ceftin [Cefuroxime Axetil] Rash     Review of Systems  Constitutional: Positive for fever.  HENT: Positive for congestion and sore throat. Negative for ear pain.   Respiratory: Positive for cough. Negative for wheezing.   Cardiovascular: Negative for chest pain.  Gastrointestinal: Positive for diarrhea. Negative for nausea, vomiting and abdominal pain.  Skin: Negative for rash.  Neurological: Positive for headaches.     Objective  Filed Vitals:   01/12/16 0856  BP: 120/70  Pulse: 120  Temp: 100.9 F (38.3 C)  TempSrc: Oral  Height: 5\' 1"  (1.549 m)  Weight: 107 lb (48.535 kg)  SpO2: 97%    Physical Exam  Constitutional: She is well-developed, well-nourished, and in no distress. No distress.  HENT:  Head: Normocephalic and atraumatic.  Right Ear: External ear normal.  Left Ear: External ear normal.  Nose: Nose normal.  Mouth/Throat: Oropharynx is clear and  moist.  Eyes: Conjunctivae and EOM are normal. Pupils are equal, round, and reactive to light. Right eye exhibits no discharge. Left eye exhibits no discharge.  Neck: Normal range of motion. Neck supple. No JVD present. No thyromegaly present.  Cardiovascular: Normal rate, regular rhythm, normal heart sounds and intact distal pulses.  Exam reveals no gallop and no friction rub.   No murmur heard. Pulmonary/Chest: Effort normal and breath sounds normal.  Abdominal: Soft. Bowel sounds are normal. She exhibits no mass. There is no tenderness. There is no guarding.  Musculoskeletal: Normal range of motion. She exhibits no edema.   Lymphadenopathy:    She has no cervical adenopathy.  Neurological: She is alert. She has normal reflexes.  Skin: Skin is warm and dry. She is not diaphoretic.  Psychiatric: Mood and affect normal.  Nursing note and vitals reviewed.     Assessment & Plan  Problem List Items Addressed This Visit    None    Visit Diagnoses    Fever and chills    -  Primary    await discussion with oncology/ordered stat CBC    Relevant Medications    ciprofloxacin (CIPRO) 500 MG tablet    Other Relevant Orders    POCT rapid strep A (Completed)    POCT Influenza A/B (Completed)    Cystitis        Relevant Medications    ciprofloxacin (CIPRO) 500 MG tablet    Other Relevant Orders    POCT Urinalysis Dipstick (Completed)    Urine Culture    Vaginitis        Relevant Medications    fluconazole (DIFLUCAN) 150 MG tablet    ciprofloxacin (CIPRO) 500 MG tablet      Sent downstairs for cbc and blood cultures   Dr. Rileigh [facility]ash[facility]  01/12/2016

## 2016-01-13 ENCOUNTER — Ambulatory Visit (INDEPENDENT_AMBULATORY_CARE_PROVIDER_SITE_OTHER): Payer: 59 | Admitting: Family Medicine

## 2016-01-13 ENCOUNTER — Other Ambulatory Visit
Admission: RE | Admit: 2016-01-13 | Discharge: 2016-01-13 | Disposition: A | Discharge status: Home / Self Care | Payer: 59 | Source: Ambulatory Visit | Attending: Family Medicine | Admitting: Family Medicine

## 2016-01-13 ENCOUNTER — Encounter: Payer: Self-pay | Admitting: Family Medicine

## 2016-01-13 VITALS — BP 120/70 | HR 70 | Temp 98.5°F | Ht 61.0 in | Wt 107.0 lb

## 2016-01-13 DIAGNOSIS — R509 Fever, unspecified: Secondary | ICD-10-CM | POA: Insufficient documentation

## 2016-01-13 DIAGNOSIS — N10 Acute tubulo-interstitial nephritis: Secondary | ICD-10-CM

## 2016-01-13 LAB — CBC WITH DIFFERENTIAL/PLATELET
BASOS ABS: 0 10*3/uL (ref 0–0.1)
BASOS PCT: 0 %
EOS PCT: 1 %
Eosinophils Absolute: 0.1 10*3/uL (ref 0–0.7)
HCT: 32.3 % — ABNORMAL LOW (ref 35.0–47.0)
Hemoglobin: 10.8 g/dL — ABNORMAL LOW (ref 12.0–16.0)
LYMPHS PCT: 5 %
Lymphs Abs: 0.5 10*3/uL — ABNORMAL LOW (ref 1.0–3.6)
MCH: 31.9 pg (ref 26.0–34.0)
MCHC: 33.4 g/dL (ref 32.0–36.0)
MCV: 95.5 fL (ref 80.0–100.0)
Monocytes Absolute: 0.3 10*3/uL (ref 0.2–0.9)
Monocytes Relative: 3 %
Neutro Abs: 8.7 10*3/uL — ABNORMAL HIGH (ref 1.4–6.5)
Neutrophils Relative %: 91 %
PLATELETS: 332 10*3/uL (ref 150–440)
RBC: 3.39 MIL/uL — AB (ref 3.80–5.20)
RDW: 13.4 % (ref 11.5–14.5)
WBC: 9.6 10*3/uL (ref 3.6–11.0)

## 2016-01-13 NOTE — Progress Notes (Signed)
Name: Emma Atkinson   MRN: [id number]    DOB: [birth year]   Date:01/13/2016       Progress Note  Subjective  Chief Complaint  Chief Complaint  Patient presents with  . Follow-up    neg flu, neg strep- cultures look good so far-     Fever  This is a new problem. The current episode started yesterday. The problem has been gradually improving. Maximum temperature: WNL. Pertinent negatives include no abdominal pain, chest pain, congestion, coughing, diarrhea, ear pain, headaches, muscle aches, nausea, rash, sleepiness, sore throat, urinary pain, vomiting or wheezing. She has tried acetaminophen (cipro) for the symptoms. The treatment provided moderate relief.  Urinary Tract Infection  This is a new problem. The current episode started in the past 7 days. The problem has been gradually improving. The patient is experiencing no pain. There has been no fever. Pertinent negatives include no chills, discharge, frequency, hematuria, nausea, urgency or vomiting. She has tried acetaminophen for the symptoms. The treatment provided no relief.    No problem-specific assessment & plan notes found for this encounter.   Past Medical History  Diagnosis Date  . Breast cancer ([place])   . Anxiety   . Depression   . Anemia     H/O  . Complication of anesthesia     HARD TO WAKE UP AFTER TONSILLECTOMY AS A CHILD BUT NO PROBLEMS SINCE    Past Surgical History  Procedure Laterality Date  . Breast biopsy Bilateral     08/17/2015   . Abdominal hysterectomy  2001  . Tonsillectomy    . Back surgery      FOR SCOLIOSIS-HERRINGTON RODS IN PLACE  . Portacath placement N/A 08/30/2015    Procedure: INSERTION PORT-A-CATH;  Surgeon: Leonie Green, MD;  Location: [facility] ORS;  Service: General;  Laterality: N/A;    Family History  Problem Relation Age of Onset  . Breast cancer Maternal Aunt 64  . Cancer Father   . Cancer Sister   . Cancer Maternal Grandfather     Social History   Social History  .  Marital Status: Married    Spouse Name: N/A  . Number of Children: N/A  . Years of Education: N/A   Occupational History  . Not on file.   Social History Main Topics  . Smoking status: Former Smoker -- 1.00 packs/day for 10 years    Types: Cigarettes    Quit date: 10/16/1999  . Smokeless tobacco: Not on file  . Alcohol Use: Yes  . Drug Use: No  . Sexual Activity: Yes   Other Topics Concern  . Not on file   Social History Narrative    Allergies  Allergen Reactions  . Ceftin [Cefuroxime Axetil] Rash     Review of Systems  Constitutional: Positive for fever. Negative for chills, weight loss and malaise/fatigue.  HENT: Negative for congestion, ear discharge, ear pain and sore throat.   Eyes: Negative for blurred vision.  Respiratory: Negative for cough, sputum production, shortness of breath and wheezing.   Cardiovascular: Negative for chest pain, palpitations and leg swelling.  Gastrointestinal: Negative for heartburn, nausea, vomiting, abdominal pain, diarrhea, constipation, blood in stool and melena.  Genitourinary: Negative for dysuria, urgency, frequency and hematuria.  Musculoskeletal: Negative for myalgias, back pain, joint pain and neck pain.  Skin: Negative for rash.  Neurological: Negative for dizziness, tingling, sensory change, focal weakness and headaches.  Endo/Heme/Allergies: Negative for environmental allergies and polydipsia. Does not bruise/bleed easily.  Psychiatric/Behavioral: Negative for  depression and suicidal ideas. The patient is not nervous/anxious and does not have insomnia.      Objective  Filed Vitals:   01/13/16 0833  BP: 120/70  Pulse: 70  Temp: 98.5 F (36.9 C)  TempSrc: Oral  Height: 5\' 1"  (1.549 m)  Weight: 107 lb (48.535 kg)    Physical Exam  Constitutional: She is well-developed, well-nourished, and in no distress. No distress.  HENT:  Head: Normocephalic and atraumatic.  Right Ear: External ear normal.  Left Ear: External  ear normal.  Nose: Nose normal.  Mouth/Throat: Oropharynx is clear and moist.  Eyes: Conjunctivae and EOM are normal. Pupils are equal, round, and reactive to light. Right eye exhibits no discharge. Left eye exhibits no discharge.  Neck: Normal range of motion. Neck supple. No JVD present. No thyromegaly present.  Cardiovascular: Normal rate, regular rhythm, normal heart sounds and intact distal pulses.  Exam reveals no gallop and no friction rub.   No murmur heard. Pulmonary/Chest: Effort normal and breath sounds normal.  Abdominal: Soft. Bowel sounds are normal. She exhibits no mass. There is tenderness in the suprapubic area. There is no guarding.  Musculoskeletal: Normal range of motion. She exhibits no edema.  Lymphadenopathy:    She has no cervical adenopathy.  Neurological: She is alert.  Skin: Skin is warm and dry. She is not diaphoretic.  Psychiatric: Mood and affect normal.  Nursing note and vitals reviewed.     Assessment & Plan  Problem List Items Addressed This Visit    None    Visit Diagnoses    Acute pyelonephritis    -  Primary    cont cipro/ repeat cbc/ reviewed blood culture/  contact oncology         Dr. Otilio Miu [facility]  01/13/2016

## 2016-01-14 LAB — URINE CULTURE

## 2016-01-17 ENCOUNTER — Other Ambulatory Visit: Payer: Self-pay | Admitting: Hematology and Oncology

## 2016-01-17 ENCOUNTER — Encounter: Payer: Self-pay | Admitting: *Deleted

## 2016-01-17 ENCOUNTER — Inpatient Hospital Stay: Payer: 59 | Attending: Hematology and Oncology

## 2016-01-17 ENCOUNTER — Inpatient Hospital Stay: Payer: 59

## 2016-01-17 VITALS — BP 143/83 | HR 98 | Temp 98.0°F

## 2016-01-17 DIAGNOSIS — Z170 Estrogen receptor positive status [ER+]: Secondary | ICD-10-CM | POA: Diagnosis not present

## 2016-01-17 DIAGNOSIS — Z862 Personal history of diseases of the blood and blood-forming organs and certain disorders involving the immune mechanism: Secondary | ICD-10-CM | POA: Insufficient documentation

## 2016-01-17 DIAGNOSIS — R5383 Other fatigue: Secondary | ICD-10-CM | POA: Diagnosis not present

## 2016-01-17 DIAGNOSIS — G629 Polyneuropathy, unspecified: Secondary | ICD-10-CM | POA: Insufficient documentation

## 2016-01-17 DIAGNOSIS — C50111 Malignant neoplasm of central portion of right female breast: Secondary | ICD-10-CM

## 2016-01-17 DIAGNOSIS — R32 Unspecified urinary incontinence: Secondary | ICD-10-CM | POA: Insufficient documentation

## 2016-01-17 DIAGNOSIS — Z8041 Family history of malignant neoplasm of ovary: Secondary | ICD-10-CM | POA: Diagnosis not present

## 2016-01-17 DIAGNOSIS — K5900 Constipation, unspecified: Secondary | ICD-10-CM | POA: Diagnosis not present

## 2016-01-17 DIAGNOSIS — R197 Diarrhea, unspecified: Secondary | ICD-10-CM | POA: Insufficient documentation

## 2016-01-17 DIAGNOSIS — F418 Other specified anxiety disorders: Secondary | ICD-10-CM | POA: Diagnosis not present

## 2016-01-17 DIAGNOSIS — Z5112 Encounter for antineoplastic immunotherapy: Secondary | ICD-10-CM | POA: Insufficient documentation

## 2016-01-17 DIAGNOSIS — Z87891 Personal history of nicotine dependence: Secondary | ICD-10-CM | POA: Insufficient documentation

## 2016-01-17 DIAGNOSIS — Z5111 Encounter for antineoplastic chemotherapy: Secondary | ICD-10-CM | POA: Diagnosis not present

## 2016-01-17 DIAGNOSIS — Z79899 Other long term (current) drug therapy: Secondary | ICD-10-CM | POA: Diagnosis not present

## 2016-01-17 DIAGNOSIS — Z803 Family history of malignant neoplasm of breast: Secondary | ICD-10-CM | POA: Diagnosis not present

## 2016-01-17 LAB — COMPREHENSIVE METABOLIC PANEL
ALT: 28 U/L (ref 14–54)
AST: 23 U/L (ref 15–41)
Albumin: 3.9 g/dL (ref 3.5–5.0)
Alkaline Phosphatase: 80 U/L (ref 38–126)
Anion gap: 9 (ref 5–15)
BUN: 12 mg/dL (ref 6–20)
CO2: 25 mmol/L (ref 22–32)
Calcium: 8.9 mg/dL (ref 8.9–10.3)
Chloride: 104 mmol/L (ref 101–111)
Creatinine, Ser: 0.61 mg/dL (ref 0.44–1.00)
GFR calc Af Amer: >60 mL/min (ref >60)
GFR calc non Af Amer: >60 mL/min (ref >60)
Glucose, Bld: 125 mg/dL — ABNORMAL HIGH (ref 65–99)
Potassium: 3.6 mmol/L (ref 3.5–5.1)
Sodium: 138 mmol/L (ref 135–145)
Total Bilirubin: 0.5 mg/dL (ref 0.3–1.2)
Total Protein: 6.8 g/dL (ref 6.5–8.1)

## 2016-01-17 LAB — CBC WITH DIFFERENTIAL/PLATELET
Basophils Absolute: 0 10*3/uL (ref 0–0.1)
Basophils Relative: 0 %
Eosinophils Absolute: 0.1 10*3/uL (ref 0–0.7)
Eosinophils Relative: 2 %
HCT: 31.1 % — ABNORMAL LOW (ref 35.0–47.0)
Hemoglobin: 10.8 g/dL — ABNORMAL LOW (ref 12.0–16.0)
Lymphocytes Relative: 14 %
Lymphs Abs: 1 10*3/uL (ref 1.0–3.6)
MCH: 32.7 pg (ref 26.0–34.0)
MCHC: 34.7 g/dL (ref 32.0–36.0)
MCV: 94.3 fL (ref 80.0–100.0)
Monocytes Absolute: 0.4 10*3/uL (ref 0.2–0.9)
Monocytes Relative: 7 %
Neutro Abs: 5.1 10*3/uL (ref 1.4–6.5)
Neutrophils Relative %: 77 %
Platelets: 387 10*3/uL (ref 150–440)
RBC: 3.3 MIL/uL — ABNORMAL LOW (ref 3.80–5.20)
RDW: 13.5 % (ref 11.5–14.5)
WBC: 6.7 10*3/uL (ref 3.6–11.0)

## 2016-01-17 LAB — CULTURE, BLOOD (ROUTINE X 2)
CULTURE: NO GROWTH
Culture: NO GROWTH

## 2016-01-17 MED ORDER — PACLITAXEL CHEMO INJECTION 300 MG/50ML
80.0000 mg/m2 | Freq: Once | INTRAVENOUS | Status: AC
  Administered 2016-01-17: 120 mg via INTRAVENOUS
  Filled 2016-01-17: qty 20

## 2016-01-17 MED ORDER — HEPARIN SOD (PORK) LOCK FLUSH 100 UNIT/ML IV SOLN
500.0000 [IU] | Freq: Once | INTRAVENOUS | Status: AC | PRN
  Administered 2016-01-17: 500 [IU]
  Filled 2016-01-17: qty 5

## 2016-01-17 MED ORDER — DIPHENHYDRAMINE HCL 50 MG/ML IJ SOLN
50.0000 mg | Freq: Once | INTRAMUSCULAR | Status: AC
Start: 2016-01-17 — End: 2016-01-17
  Administered 2016-01-17: 50 mg via INTRAVENOUS
  Filled 2016-01-17: qty 1

## 2016-01-17 MED ORDER — SODIUM CHLORIDE 0.9 % IV SOLN
Freq: Once | INTRAVENOUS | Status: AC
  Administered 2016-01-17: 10:00:00 via INTRAVENOUS
  Filled 2016-01-17: qty 1000

## 2016-01-17 MED ORDER — FAMOTIDINE IN NACL 20-0.9 MG/50ML-% IV SOLN
20.0000 mg | Freq: Once | INTRAVENOUS | Status: AC
  Administered 2016-01-17: 20 mg via INTRAVENOUS
  Filled 2016-01-17: qty 50

## 2016-01-17 MED ORDER — SODIUM CHLORIDE 0.9 % IV SOLN
20.0000 mg | Freq: Once | INTRAVENOUS | Status: AC
  Administered 2016-01-17: 20 mg via INTRAVENOUS
  Filled 2016-01-17: qty 2

## 2016-01-17 NOTE — Progress Notes (Signed)
  Oncology Nurse Navigator Documentation  Navigator Location: [facility] (01/17/16 1000) Navigator Encounter Type: Treatment (01/17/16 1000)           Patient Visit Type: Follow-up (chemo) (01/17/16 1000) Treatment Phase: Other (chemo) (01/17/16 1000) Barriers/Navigation Needs: No barriers at this time (01/17/16 1000)                          Time Spent with Patient: 15 (01/17/16 1000)   Met patient during her chemo treatment today.  States she is doing well.  No needs at this time.  She is to call if she has any questions or needs.

## 2016-01-24 ENCOUNTER — Inpatient Hospital Stay: Payer: 59

## 2016-01-24 ENCOUNTER — Other Ambulatory Visit: Payer: Self-pay | Admitting: Hematology and Oncology

## 2016-01-24 ENCOUNTER — Other Ambulatory Visit: Payer: 59

## 2016-01-24 VITALS — BP 104/72 | HR 92 | Temp 96.0°F | Resp 20

## 2016-01-24 DIAGNOSIS — C50111 Malignant neoplasm of central portion of right female breast: Secondary | ICD-10-CM

## 2016-01-24 DIAGNOSIS — Z170 Estrogen receptor positive status [ER+]: Secondary | ICD-10-CM | POA: Diagnosis not present

## 2016-01-24 DIAGNOSIS — Z5111 Encounter for antineoplastic chemotherapy: Secondary | ICD-10-CM | POA: Diagnosis not present

## 2016-01-24 LAB — CBC WITH DIFFERENTIAL/PLATELET
Basophils Absolute: 0 10*3/uL (ref 0–0.1)
Basophils Relative: 0 %
Eosinophils Absolute: 0.1 10*3/uL (ref 0–0.7)
Eosinophils Relative: 1 %
HCT: 32.1 % — ABNORMAL LOW (ref 35.0–47.0)
Hemoglobin: 11 g/dL — ABNORMAL LOW (ref 12.0–16.0)
Lymphocytes Relative: 12 %
Lymphs Abs: 0.9 10*3/uL — ABNORMAL LOW (ref 1.0–3.6)
MCH: 32.4 pg (ref 26.0–34.0)
MCHC: 34.3 g/dL (ref 32.0–36.0)
MCV: 94.5 fL (ref 80.0–100.0)
Monocytes Absolute: 0.3 10*3/uL (ref 0.2–0.9)
Monocytes Relative: 5 %
Neutro Abs: 5.9 10*3/uL (ref 1.4–6.5)
Neutrophils Relative %: 82 %
Platelets: 437 10*3/uL (ref 150–440)
RBC: 3.39 MIL/uL — ABNORMAL LOW (ref 3.80–5.20)
RDW: 13.8 % (ref 11.5–14.5)
WBC: 7.2 10*3/uL (ref 3.6–11.0)

## 2016-01-24 LAB — COMPREHENSIVE METABOLIC PANEL
ALT: 27 U/L (ref 14–54)
AST: 20 U/L (ref 15–41)
Albumin: 3.8 g/dL (ref 3.5–5.0)
Alkaline Phosphatase: 72 U/L (ref 38–126)
Anion gap: 4 — ABNORMAL LOW (ref 5–15)
BUN: 16 mg/dL (ref 6–20)
CO2: 25 mmol/L (ref 22–32)
Calcium: 8.9 mg/dL (ref 8.9–10.3)
Chloride: 109 mmol/L (ref 101–111)
Creatinine, Ser: 0.71 mg/dL (ref 0.44–1.00)
GFR calc Af Amer: >60 mL/min (ref >60)
GFR calc non Af Amer: >60 mL/min (ref >60)
Glucose, Bld: 115 mg/dL — ABNORMAL HIGH (ref 65–99)
Potassium: 3.7 mmol/L (ref 3.5–5.1)
Sodium: 138 mmol/L (ref 135–145)
Total Bilirubin: 0.4 mg/dL (ref 0.3–1.2)
Total Protein: 6.5 g/dL (ref 6.5–8.1)

## 2016-01-24 MED ORDER — PACLITAXEL CHEMO INJECTION 300 MG/50ML
80.0000 mg/m2 | Freq: Once | INTRAVENOUS | Status: AC
  Administered 2016-01-24: 120 mg via INTRAVENOUS
  Filled 2016-01-24: qty 20

## 2016-01-24 MED ORDER — SODIUM CHLORIDE 0.9 % IV SOLN
20.0000 mg | Freq: Once | INTRAVENOUS | Status: AC
  Administered 2016-01-24: 20 mg via INTRAVENOUS
  Filled 2016-01-24: qty 2

## 2016-01-24 MED ORDER — SODIUM CHLORIDE 0.9 % IV SOLN
Freq: Once | INTRAVENOUS | Status: AC
  Administered 2016-01-24: 10:00:00 via INTRAVENOUS
  Filled 2016-01-24: qty 1000

## 2016-01-24 MED ORDER — DIPHENHYDRAMINE HCL 50 MG/ML IJ SOLN
50.0000 mg | Freq: Once | INTRAMUSCULAR | Status: AC
  Administered 2016-01-24: 50 mg via INTRAVENOUS
  Filled 2016-01-24: qty 1

## 2016-01-24 MED ORDER — SODIUM CHLORIDE 0.9% FLUSH
10.0000 mL | INTRAVENOUS | Status: DC | PRN
  Administered 2016-01-24: 10 mL
  Filled 2016-01-24: qty 10

## 2016-01-24 MED ORDER — FAMOTIDINE IN NACL 20-0.9 MG/50ML-% IV SOLN
20.0000 mg | Freq: Once | INTRAVENOUS | Status: AC
  Administered 2016-01-24: 20 mg via INTRAVENOUS
  Filled 2016-01-24: qty 50

## 2016-01-24 MED ORDER — HEPARIN SOD (PORK) LOCK FLUSH 100 UNIT/ML IV SOLN
500.0000 [IU] | Freq: Once | INTRAVENOUS | Status: AC | PRN
  Administered 2016-01-24: 500 [IU]
  Filled 2016-01-24: qty 5

## 2016-01-31 ENCOUNTER — Inpatient Hospital Stay: Payer: 59

## 2016-01-31 ENCOUNTER — Other Ambulatory Visit: Payer: Self-pay | Admitting: Hematology and Oncology

## 2016-01-31 ENCOUNTER — Encounter: Payer: Self-pay | Admitting: Hematology and Oncology

## 2016-01-31 ENCOUNTER — Inpatient Hospital Stay (HOSPITAL_BASED_OUTPATIENT_CLINIC_OR_DEPARTMENT_OTHER): Payer: 59 | Admitting: Hematology and Oncology

## 2016-01-31 VITALS — BP 142/82 | HR 103 | Resp 18 | Ht 61.0 in | Wt 110.0 lb

## 2016-01-31 DIAGNOSIS — Z5111 Encounter for antineoplastic chemotherapy: Secondary | ICD-10-CM | POA: Diagnosis not present

## 2016-01-31 DIAGNOSIS — Z862 Personal history of diseases of the blood and blood-forming organs and certain disorders involving the immune mechanism: Secondary | ICD-10-CM

## 2016-01-31 DIAGNOSIS — R5383 Other fatigue: Secondary | ICD-10-CM

## 2016-01-31 DIAGNOSIS — Z8041 Family history of malignant neoplasm of ovary: Secondary | ICD-10-CM

## 2016-01-31 DIAGNOSIS — Z79899 Other long term (current) drug therapy: Secondary | ICD-10-CM

## 2016-01-31 DIAGNOSIS — C50111 Malignant neoplasm of central portion of right female breast: Secondary | ICD-10-CM | POA: Diagnosis not present

## 2016-01-31 DIAGNOSIS — R197 Diarrhea, unspecified: Secondary | ICD-10-CM

## 2016-01-31 DIAGNOSIS — Z87891 Personal history of nicotine dependence: Secondary | ICD-10-CM

## 2016-01-31 DIAGNOSIS — Z803 Family history of malignant neoplasm of breast: Secondary | ICD-10-CM

## 2016-01-31 DIAGNOSIS — T451X5A Adverse effect of antineoplastic and immunosuppressive drugs, initial encounter: Secondary | ICD-10-CM

## 2016-01-31 DIAGNOSIS — G620 Drug-induced polyneuropathy: Secondary | ICD-10-CM

## 2016-01-31 DIAGNOSIS — R32 Unspecified urinary incontinence: Secondary | ICD-10-CM | POA: Diagnosis not present

## 2016-01-31 DIAGNOSIS — Z170 Estrogen receptor positive status [ER+]: Secondary | ICD-10-CM | POA: Diagnosis not present

## 2016-01-31 DIAGNOSIS — F418 Other specified anxiety disorders: Secondary | ICD-10-CM

## 2016-01-31 DIAGNOSIS — G629 Polyneuropathy, unspecified: Secondary | ICD-10-CM

## 2016-01-31 DIAGNOSIS — K5900 Constipation, unspecified: Secondary | ICD-10-CM

## 2016-01-31 LAB — COMPREHENSIVE METABOLIC PANEL
ALT: 30 U/L (ref 14–54)
AST: 22 U/L (ref 15–41)
Albumin: 4.1 g/dL (ref 3.5–5.0)
Alkaline Phosphatase: 80 U/L (ref 38–126)
Anion gap: 3 — ABNORMAL LOW (ref 5–15)
BUN: 15 mg/dL (ref 6–20)
CO2: 26 mmol/L (ref 22–32)
Calcium: 8.9 mg/dL (ref 8.9–10.3)
Chloride: 107 mmol/L (ref 101–111)
Creatinine, Ser: 0.75 mg/dL (ref 0.44–1.00)
GFR calc Af Amer: >60 mL/min (ref >60)
GFR calc non Af Amer: >60 mL/min (ref >60)
Glucose, Bld: 117 mg/dL — ABNORMAL HIGH (ref 65–99)
Potassium: 4 mmol/L (ref 3.5–5.1)
Sodium: 136 mmol/L (ref 135–145)
Total Bilirubin: 0.4 mg/dL (ref 0.3–1.2)
Total Protein: 6.8 g/dL (ref 6.5–8.1)

## 2016-01-31 LAB — CBC WITH DIFFERENTIAL/PLATELET
Basophils Absolute: 0 10*3/uL (ref 0–0.1)
Basophils Relative: 0 %
Eosinophils Absolute: 0.1 10*3/uL (ref 0–0.7)
Eosinophils Relative: 2 %
HCT: 32.9 % — ABNORMAL LOW (ref 35.0–47.0)
Hemoglobin: 11.3 g/dL — ABNORMAL LOW (ref 12.0–16.0)
Lymphocytes Relative: 15 %
Lymphs Abs: 0.9 10*3/uL — ABNORMAL LOW (ref 1.0–3.6)
MCH: 32.2 pg (ref 26.0–34.0)
MCHC: 34.2 g/dL (ref 32.0–36.0)
MCV: 94.4 fL (ref 80.0–100.0)
Monocytes Absolute: 0.3 10*3/uL (ref 0.2–0.9)
Monocytes Relative: 6 %
Neutro Abs: 4.5 10*3/uL (ref 1.4–6.5)
Neutrophils Relative %: 77 %
Platelets: 420 10*3/uL (ref 150–440)
RBC: 3.49 MIL/uL — ABNORMAL LOW (ref 3.80–5.20)
RDW: 13.4 % (ref 11.5–14.5)
WBC: 5.8 10*3/uL (ref 3.6–11.0)

## 2016-01-31 MED ORDER — HEPARIN SOD (PORK) LOCK FLUSH 100 UNIT/ML IV SOLN
500.0000 [IU] | Freq: Once | INTRAVENOUS | Status: AC | PRN
  Administered 2016-01-31: 500 [IU]
  Filled 2016-01-31: qty 5

## 2016-01-31 MED ORDER — FAMOTIDINE IN NACL 20-0.9 MG/50ML-% IV SOLN
20.0000 mg | Freq: Once | INTRAVENOUS | Status: AC
  Administered 2016-01-31: 20 mg via INTRAVENOUS
  Filled 2016-01-31: qty 50

## 2016-01-31 MED ORDER — DEXAMETHASONE SODIUM PHOSPHATE 100 MG/10ML IJ SOLN
20.0000 mg | Freq: Once | INTRAMUSCULAR | Status: AC
  Administered 2016-01-31: 20 mg via INTRAVENOUS
  Filled 2016-01-31: qty 2

## 2016-01-31 MED ORDER — DEXTROSE 5 % IV SOLN
80.0000 mg/m2 | Freq: Once | INTRAVENOUS | Status: AC
  Administered 2016-01-31: 120 mg via INTRAVENOUS
  Filled 2016-01-31: qty 20

## 2016-01-31 MED ORDER — DIPHENHYDRAMINE HCL 50 MG/ML IJ SOLN
50.0000 mg | Freq: Once | INTRAMUSCULAR | Status: AC
  Administered 2016-01-31: 50 mg via INTRAVENOUS
  Filled 2016-01-31: qty 1

## 2016-01-31 MED ORDER — SODIUM CHLORIDE 0.9 % IV SOLN
420.0000 mg | Freq: Once | INTRAVENOUS | Status: AC
  Administered 2016-01-31: 420 mg via INTRAVENOUS
  Filled 2016-01-31: qty 14

## 2016-01-31 MED ORDER — TRASTUZUMAB CHEMO INJECTION 440 MG
6.0000 mg/kg | Freq: Once | INTRAVENOUS | Status: AC
  Administered 2016-01-31: 294 mg via INTRAVENOUS
  Filled 2016-01-31: qty 14

## 2016-01-31 MED ORDER — ACETAMINOPHEN 325 MG PO TABS
650.0000 mg | ORAL_TABLET | Freq: Once | ORAL | Status: AC
  Administered 2016-01-31: 650 mg via ORAL
  Filled 2016-01-31: qty 2

## 2016-01-31 MED ORDER — SODIUM CHLORIDE 0.9 % IV SOLN
Freq: Once | INTRAVENOUS | Status: AC
  Administered 2016-01-31: 10:00:00 via INTRAVENOUS
  Filled 2016-01-31: qty 1000

## 2016-01-31 NOTE — Progress Notes (Signed)
[place] [facility] day:  01/10/2016   Chief Complaint: Emma Atkinson is a 50 y.o. female with clinical stage IIB Her2/neu+ right breast cancer who is seen for review of interval studies and assessment prior to cycle #3 neoadjuvant Herceptin and Perjeta and week #7 Taxol  HPI:  The patient was last seen in the medical oncology clinic on 12/27/2015.  At that time, she was seen for sick call visit prior to week #5 neoadjuvant Taxol.  She described chest tightness when taking a deep breath and walking around.  Chest CT angiogram revealed no evidence of pulmonary embolism.  There were minor anterior bilateral upper lobe patchy and subpleural ground-glass opacities, nonspecific, suspect mild scattered alveolitis. She was referred to pulmonary medicine.  Per prior plan, she underwent follow-up right breast ultrasound on 01/06/2016.  Imaging revealed that the 2 previously measured dominant masses within the right breast located at the 12 o'clock axis and 9 o'clock axis were not significantly changed in size compared to the previous ultrasound of 11/25/2015.  The lesion at the 12 o'clock position was 3 x 1 x 2.8 cm compared to 3.1 x 1.1 x 2.4 cm.  At the 9 o'clock position, on lesion was 1.6 x 0.9 x 1.2 cm (previously 1.3 x 1.1 x 1.2 cm).  A second lesion was 0.9 x 0.4 x 0.6 cm (previously 0.7 x 0.4 x 0.7 cm).  Symptomatically, she notes urinary incontinence if she coughs or sneezes.  She has had incontinence at night.  She has felt like she has had a yeast infection for 1 or 2 days.  She describes her feet being cool and somewhat numb and tingly.  She is able to perform her activities of daily living.  Past Medical History  Diagnosis Date  . Breast cancer ([place])   . Anxiety   . Depression   . Anemia     H/O  . Complication of anesthesia     HARD TO WAKE UP AFTER TONSILLECTOMY AS A CHILD BUT NO PROBLEMS SINCE    Past Surgical History  Procedure Laterality Date   . Breast biopsy Bilateral     08/17/2015   . Abdominal hysterectomy  2001  . Tonsillectomy    . Back surgery      FOR SCOLIOSIS-HERRINGTON RODS IN PLACE  . Portacath placement N/A 08/30/2015    Procedure: INSERTION PORT-A-CATH;  Surgeon: Leonie Green, MD;  Location: [facility] ORS;  Service: General;  Laterality: N/A;    Family History  Problem Relation Age of Onset  . Breast cancer Maternal Aunt 64  . Cancer Father   . Cancer Sister   . Cancer Maternal Grandfather     Social History:  reports that she quit smoking about 16 years ago. Her smoking use included Cigarettes. She has a 10 pack-year smoking history. She does not have any smokeless tobacco history on file. She reports that she drinks alcohol. She reports that she does not use illicit drugs.  The patient is accompanied by her mother and husband today.  Allergies:  Allergies  Allergen Reactions  . Ceftin [Cefuroxime Axetil] Rash    Current Medications: Current Outpatient Prescriptions  Medication Sig Dispense Refill  . ALPRAZolam (XANAX) 0.25 MG tablet Take 1/4 tablet up to three times daily as needed    . Dexlansoprazole (DEXILANT) 30 MG capsule Take 30 mg by mouth daily.    . diphenhydrAMINE (SOMINEX) 25 MG tablet Take 25 mg by mouth at bedtime. Reported  on 01/12/2016    . lidocaine-prilocaine (EMLA) cream Apply 1 application topically as needed. Apply at least 1-2 hours before the port is to be used. 30 g 1  . LORazepam (ATIVAN) 0.5 MG tablet Take 1 tablet (0.5 mg total) by mouth every 6 (six) hours as needed (Nausea or vomiting). 30 tablet 0  . Multiple Vitamin (MULTIVITAMIN) capsule Take 1 capsule by mouth daily.    . ciprofloxacin (CIPRO) 500 MG tablet Take 1 tablet (500 mg total) by mouth 2 (two) times daily. 20 tablet 0  . fluconazole (DIFLUCAN) 150 MG tablet Take 1 tablet (150 mg total) by mouth once. 1 tablet 0  . ondansetron (ZOFRAN) 4 MG tablet Take 4 mg by mouth every 8 (eight) hours as needed for nausea or  vomiting.     No current facility-administered medications for this visit.    Review of Systems:  GENERAL:  Fatigue.  No fevers or sweats.  Weight up 1.5 pounds. PERFORMANCE STATUS (ECOG):  1 HEENT:  No visual changes, runny nose, sore throat, mouth sores or tenderness. Lungs: Little congestion.  Chest tightness when walking around.  No pleuritic chest pain.  No hemoptysis. Cardiac:  No chest pain, palpitations, orthopnea, or PND. GI:  Poor good.  No nausea, vomiting, diarrhea, constipation, melena or hematochezia. GU:  Stress incontinence.  No urgency, frequency, dysuria, or hematuria. ? yeast infection. Musculoskeletal:  No back pain.  No joint pain.  No muscle tenderness. Extremities:  No pain or swelling. Skin:  No rashes or skin changes. Neuro:  Fingertips and feet feel cold.  Little numbness/tingling in feet.  No weakness, balance or coordination issues. Endocrine:  No diabetes, thyroid issues, hot flashes or night sweats. Psych:  Mood change with recent ultrasound.  Poor sleep. Pain:  No focal pain. Review of systems:  All other systems reviewed and found to be negative.  Physical Exam: Blood pressure 124/88, pulse 99, temperature 98.4 F (36.9 C), temperature source Tympanic, resp. rate 16, weight 107 lb 9.4 oz (48.8 kg). O2 sats 94% with ambulation. GENERAL:  Thin woman sitting comfortably in the exam room in no acute distress.  MENTAL STATUS:  Alert and oriented to person, place and time. HEAD:  Wearing a black scarf.  Alopecia.  Normocephalic, atraumatic, face symmetric (thin), no Cushingoid features. EYES:  Blue eyes.  Pupils equal round and reactive to light and accomodation.  No conjunctivitis or scleral icterus. ENT:  Oropharynx clear without lesion.  Tongue normal. Mucous membranes moist.  RESPIRATORY:  Clear to auscultation without rales, wheezes or rhonchi. CARDIOVASCULAR:  Regular rate and rhythm without murmur, rub or gallop. No JVD. ABDOMEN:  Soft, non-tender,  with active bowel sounds, and no hepatosplenomegaly.  No masses. SKIN:  No rashes, ulcers or lesions. EXTREMITIES: Hands cool.  No edema, no skin discoloration or tenderness.  No palpable cords. LYMPH NODES: No palpable cervical, supraclavicular, axillary or inguinal adenopathy  NEUROLOGICAL: Unremarkable. PSYCH:  Appropriate.   Infusion on 01/10/2016  Component Date Value Ref Range Status  . WBC 01/10/2016 9.2  3.6 - 11.0 K/uL Final  . RBC 01/10/2016 3.49* 3.80 - 5.20 MIL/uL Final  . Hemoglobin 01/10/2016 11.2* 12.0 - 16.0 g/dL Final  . HCT 01/10/2016 33.0* 35.0 - 47.0 % Final  . MCV 01/10/2016 94.5  80.0 - 100.0 fL Final  . MCH 01/10/2016 32.1  26.0 - 34.0 pg Final  . MCHC 01/10/2016 33.9  32.0 - 36.0 g/dL Final  . RDW 01/10/2016 13.5  11.5 - 14.5 %  Final  . Platelets 01/10/2016 409  150 - 440 K/uL Final  . Neutrophils Relative % 01/10/2016 84   Final  . Neutro Abs 01/10/2016 7.8* 1.4 - 6.5 K/uL Final  . Lymphocytes Relative 01/10/2016 8   Final  . Lymphs Abs 01/10/2016 0.7* 1.0 - 3.6 K/uL Final  . Monocytes Relative 01/10/2016 6   Final  . Monocytes Absolute 01/10/2016 0.5  0.2 - 0.9 K/uL Final  . Eosinophils Relative 01/10/2016 2   Final  . Eosinophils Absolute 01/10/2016 0.1  0 - 0.7 K/uL Final  . Basophils Relative 01/10/2016 0   Final  . Basophils Absolute 01/10/2016 0.0  0 - 0.1 K/uL Final  . Sodium 01/10/2016 136  135 - 145 mmol/L Final  . Potassium 01/10/2016 3.8  3.5 - 5.1 mmol/L Final  . Chloride 01/10/2016 103  101 - 111 mmol/L Final  . CO2 01/10/2016 25  22 - 32 mmol/L Final  . Glucose, Bld 01/10/2016 103* 65 - 99 mg/dL Final  . BUN 01/10/2016 10  6 - 20 mg/dL Final  . Creatinine, Ser 01/10/2016 0.74  0.44 - 1.00 mg/dL Final  . Calcium 01/10/2016 8.7* 8.9 - 10.3 mg/dL Final  . Total Protein 01/10/2016 6.8  6.5 - 8.1 g/dL Final  . Albumin 01/10/2016 3.9  3.5 - 5.0 g/dL Final  . AST 01/10/2016 21  15 - 41 U/L Final  . ALT 01/10/2016 22  14 - 54 U/L Final  .  Alkaline Phosphatase 01/10/2016 89  38 - 126 U/L Final  . Total Bilirubin 01/10/2016 0.6  0.3 - 1.2 mg/dL Final  . GFR calc non Af Amer 01/10/2016 >60  >60 mL/min Final  . GFR calc Af Amer 01/10/2016 >60  >60 mL/min Final   Comment: (NOTE) The eGFR has been calculated using the CKD EPI equation. This calculation has not been validated in all clinical situations. eGFR's persistently <60 mL/min signify possible Chronic Kidney Disease.   . Anion gap 01/10/2016 8  5 - 15 Final  . Magnesium 01/10/2016 1.9  1.7 - 2.4 mg/dL Final  . Color, Urine 01/10/2016 STRAW* YELLOW Final  . APPearance 01/10/2016 CLEAR* CLEAR Final  . Glucose, UA 01/10/2016 NEGATIVE  NEGATIVE mg/dL Final  . Bilirubin Urine 01/10/2016 NEGATIVE  NEGATIVE Final  . Ketones, ur 01/10/2016 NEGATIVE  NEGATIVE mg/dL Final  . Specific Gravity, Urine 01/10/2016 1.002* 1.005 - 1.030 Final  . Hgb urine dipstick 01/10/2016 1+* NEGATIVE Final  . pH 01/10/2016 7.0  5.0 - 8.0 Final  . Protein, ur 01/10/2016 NEGATIVE  NEGATIVE mg/dL Final  . Nitrite 01/10/2016 NEGATIVE  NEGATIVE Final  . Leukocytes, UA 01/10/2016 2+* NEGATIVE Final  . RBC / HPF 01/10/2016 0-5  0 - 5 RBC/hpf Final  . WBC, UA 01/10/2016 6-30  0 - 5 WBC/hpf Final  . Bacteria, UA 01/10/2016 NONE SEEN  NONE SEEN Final  . Squamous Epithelial / LPF 01/10/2016 0-5* NONE SEEN Final  . Mucous 01/10/2016 PRESENT   Final  . Specimen Description 01/10/2016 URINE, CLEAN CATCH   Final  . Special Requests 01/10/2016 CLEAN CATCH URINE   Final  . Culture 01/10/2016 7,000 COLONIES/mL INSIGNIFICANT GROWTH   Final  . Report Status 01/10/2016 01/11/2016 FINAL   Final  . CA 27.29 01/10/2016 24.7  0.0 - 38.6 U/mL Final   Comment: (NOTE) [facility]ayer [facility]/ACS methodology Performed At: [facility] [address] [place], [place] [id number] Lindon Romp MD UX:[id number]     Assessment:  Emma Atkinson is a 50  y.o. female with clinical stage T2N1 (stage IIB) Her2/neu + right breast  cancer status post biopsy.  She presented with a minimally painful right breast mass with nipple inversion.  Bilateral mammogram and ultrasound on 08/12/2015 revealed a 4.1 cm right superior breast mass containing pleomorphic microcalcifications. There were several satellite lesions (1.3 cm, 0.9 cm, 0.6 cm) plus a 1.3 cm mass versus complicated cyst and a 0.8 cm cyst.  There was one 5 mm right axillary node with irregular thickened cortex.   Left breast revealed a 1.5 cm cyst containing intramural nodule versus debris at the 9:00 position 2 cm from the nipple. There was also an indeterminate left breast 7 mm nodule at the 2:00 position 4 cm from the nipple.  Left breast biopsy at the 9:00 position on 08/17/2015 revealed intraductal papilloma with fibrocystic changes.   Core needle biopsy on 08/17/2015 of the right breast mass revealed a grade 3 invasive mammary carcinoma. Right axillary biopsy confirmed metastatic mammary carcinoma. Tumor was ER positive (1-10%), PR positive (11-50%) and HER-2/neu 3+  PET scan on 08/26/2015 revealed hypermetabolic right breast mass (SUV 11.3) with a hypermetabolic right axillary lymph node and a subtle focus of very faint hypermetabolic activity laterally in the right breast. There was faint hypermetabolic activity at the biopsy site of the left breast (? biopsy related).  CA27.29 was 19.6 on 08/22/2015.  She is s/p partial hysterectomy.  She is premenopausal ([place] 5 and estradiol 271.9) on 08/22/2015.  Echo on 08/26/2015 revealed an ejection fraction of 55-60%.  Echo on 11/25/2015 revealed an EF of 60-65%.  She has a family history of breast and ovarian cancer.  My Risk genetic testing revealed no mutation on 08/22/2015.  She received 4 cycles of AC (09/13/2015 - 11/08/2015) with Neulasta support. Right sided mammogram and ultrasound on 11/25/2015 revealed extensive disease throughout the right breast. By ultrasound, the central mass was slightly smaller (4.1 x 2.3 x 4  cm to 3.1 x 1.0 x 2.4 cm) but with increased nipple retraction. An adjacent nodule was slightly larger (1.2 x 1.0 x 1.3 cm to 1.3 x 1.1 x 1.2 cm).  The extent of calcifications was larger, measuring at least 3.8 x 3.1 x 8.1 cm.  She is s/p 2 cycles of Herceptin and Perjeta (11/29/2015 - 12/20/2015) and week #6 of Taxol (11/29/2015 - 01/03/2016).  She is tolerating her chemotherapy well.  She has a grade I neuropathy (stable).  Right breast ultrasound on 01/06/2016 revealed stable disease.  The lesion at the 12 o'clock position was 3 x 1 x 2.8 cm.  At the 9 o'clock position, one lesion was 1.6 x 0.9 x 1.2 cm.  A second lesion was 0.9 x 0.4 x 0.6 cm.  Chest CT angiogram on 12/27/2015 revealed no evidence of pulmonary embolism.  There were minor anterior bilateral upper lobe patchy and subpleural ground-glass opacities, nonspecific, suspect mild scattered alveolitis.  Symptomatically, she has some urinary incontinence.  She has a grade I neuropathy.  Exam is stable.  Plan: 1.  Labs today:  CBC with diff, CMP, Mg, CA27.29. 2.  Review interval breast ultrasound and stability of lesion.  No evidence of progression.  Extensive discussions regarding direction of therapy.  With stability of disease and aggressive Her2/neu therapy, discussed continuation of Herceptin, Perjeta, and Taxol.  Discussed my phone follow-up with Dr. Rochel Brome.  Patient and husband wish to follow-up to discuss further with Dr. Tamala Julian.  Discuss plan for surgery following completion of chemotherapy, radiation, and completion  of a year of adjuvant Herceptin. 3.  Cycle #3 Herceptin and Perjeta today. 4.  Week #7 Taxol today. 5.  Continue weekly labs and Taxol. 6.  Urinalysis and culture. 7.  RTC in 3 weeks  for MD assessment, labs (CBC with diff, CMP, Mg), and cycle #4 Herceptin and Perjeta and week #10 Taxol.   Lequita Asal, MD  01/10/2016

## 2016-01-31 NOTE — Progress Notes (Signed)
Emma Atkinson day:  01/31/2016   Chief Complaint: Emma Atkinson is a 50 y.o. female with clinical stage IIB Her2/neu+ right breast cancer who is seen for assessment prior to cycle #4 neoadjuvant Herceptin and Perjeta and week #10 Taxol  HPI:  The patient was last seen in the medical oncology [facility] on 01/10/2016.  At that time, right breast ultrasound was reviewed.  Breast lesions were not significantly changed in size.  We discussed continuation of therapy.  She received cycle #3 neoadjuvant Herceptin and week #7 Taxol.  Symptomatically, she noted some urinary incontinence.  Urine culture had 7,000 colonies/ml (insignificant growth).  She was subsequently seen by Dr. Otilio Miu.  She was diagnosed with pyelonephritis and received a 7 day course of ciprofloxacin.  Her urinary symptoms have resolved.  Her neuropathy is not as bad. She notes that her the tips of her fingers are a little numb.  She has had some diarrhea alternating with constipation. She has a appointment with a pulmonary medicine on 02/01/2016 for assessment of her respiratory symptoms she noted approximately 1 month ago.  Symptoms have since resolved. She denies any shortness of breath. She has some fatigue.   She notes no changes in her breast. She has a pre-op appointment on 02/09/2016.  She is considering double mastectomy.   Past Medical History  Diagnosis Date  . Breast cancer ([place])   . Anxiety   . Depression   . Anemia     H/O  . Complication of anesthesia     HARD TO WAKE UP AFTER TONSILLECTOMY AS A CHILD BUT NO PROBLEMS SINCE    Past Surgical History  Procedure Laterality Date  . Breast biopsy Bilateral     08/17/2015   . Abdominal hysterectomy  2001  . Tonsillectomy    . Back surgery      FOR SCOLIOSIS-HERRINGTON RODS IN PLACE  . Portacath placement N/A 08/30/2015    Procedure: INSERTION PORT-A-CATH;  Surgeon: Leonie Green, MD;  Location: [facility] ORS;   Service: General;  Laterality: N/A;    Family History  Problem Relation Age of Onset  . Breast cancer Maternal Aunt 64  . Cancer Father   . Cancer Sister   . Cancer Maternal Grandfather     Social History:  reports that she quit smoking about 16 years ago. Her smoking use included Cigarettes. She has a 10 pack-year smoking history. She does not have any smokeless tobacco history on file. She reports that she drinks alcohol. She reports that she does not use illicit drugs.  The patient is accompanied by her husband today.  Allergies:  Allergies  Allergen Reactions  . Ceftin [Cefuroxime Axetil] Rash    Current Medications: Current Outpatient Prescriptions  Medication Sig Dispense Refill  . ALPRAZolam (XANAX) 0.25 MG tablet Take 1/4 tablet up to three times daily as needed    . Dexlansoprazole (DEXILANT) 30 MG capsule Take 30 mg by mouth daily.    . diphenhydrAMINE (SOMINEX) 25 MG tablet Take 25 mg by mouth at bedtime. Reported on 01/12/2016    . fluconazole (DIFLUCAN) 150 MG tablet Take 1 tablet (150 mg total) by mouth once. 1 tablet 0  . lidocaine-prilocaine (EMLA) cream Apply 1 application topically as needed. Apply at least 1-2 hours before the port is to be used. 30 g 1  . LORazepam (ATIVAN) 0.5 MG tablet Take 1 tablet (0.5 mg total) by mouth every 6 (six) hours as needed (Nausea or  vomiting). 30 tablet 0  . Multiple Vitamin (MULTIVITAMIN) capsule Take 1 capsule by mouth daily.    . ondansetron (ZOFRAN) 4 MG tablet Take 4 mg by mouth every 8 (eight) hours as needed for nausea or vomiting.     No current facility-administered medications for this visit.    Review of Systems:  GENERAL:  Fatigue.  No fevers or sweats.  Weight up 3 pounds. PERFORMANCE STATUS (ECOG):  1 HEENT:  No visual changes, runny nose, sore throat, mouth sores or tenderness. Lungs: No shortness of breath or cough.  No pleuritic chest pain.  No hemoptysis. Cardiac:  No chest pain, palpitations, orthopnea, or  PND. GI:  Alternating diarrhea and constipation.  No nausea, vomiting, melena or hematochezia. GU:  No urgency, frequency, dysuria, or hematuria. Musculoskeletal:  No back pain.  No joint pain.  No muscle tenderness. Extremities:  No pain or swelling. Skin:  No rashes or skin changes. Neuro:  Fingertips and feet feel cold.  Little numbness/tingling in tips of fingers.  No weakness, balance or coordination issues. Endocrine:  No diabetes, thyroid issues, hot flashes or night sweats. Psych:  No mood changes. Pain:  No focal pain. Review of systems:  All other systems reviewed and found to be negative.  Physical Exam: Blood pressure 142/82, pulse 103, resp. rate 18, height '5\' 1"'  (1.549 m), weight 110 lb 0.2 oz (49.9 kg). O2 sats 94% with ambulation. GENERAL:  Thin woman sitting comfortably in the exam room in no acute distress.  MENTAL STATUS:  Alert and oriented to person, place and time. HEAD:  Wearing a pink cap.Normocephalic, atraumatic, face symmetric (thin), no Cushingoid features. EYES:  Blue eyes.  Pupils equal round and reactive to light and accomodation.  No conjunctivitis or scleral icterus. ENT:  Oropharynx clear without lesion.  Tongue normal. Mucous membranes moist.  RESPIRATORY:  Clear to auscultation without rales, wheezes or rhonchi. CARDIOVASCULAR:  Regular rate and rhythm without murmur, rub or gallop. No JVD. BREAST: Right breast with central breast mass (3 cm) with inverted nipple. ABDOMEN:  Soft, non-tender, with active bowel sounds, and no hepatosplenomegaly.  No masses. SKIN:  No rashes, ulcers or lesions. EXTREMITIES: No edema, no skin discoloration or tenderness.  No palpable cords. LYMPH NODES: No palpable cervical, supraclavicular, axillary or inguinal adenopathy  NEUROLOGICAL: Unremarkable. PSYCH:  Appropriate.   Appointment on 01/31/2016  Component Date Value Ref Range Status  . WBC 01/31/2016 5.8  3.6 - 11.0 K/uL Final  . RBC 01/31/2016 3.49* 3.80 - 5.20  MIL/uL Final  . Hemoglobin 01/31/2016 11.3* 12.0 - 16.0 g/dL Final  . HCT 01/31/2016 32.9* 35.0 - 47.0 % Final  . MCV 01/31/2016 94.4  80.0 - 100.0 fL Final  . MCH 01/31/2016 32.2  26.0 - 34.0 pg Final  . MCHC 01/31/2016 34.2  32.0 - 36.0 g/dL Final  . RDW 01/31/2016 13.4  11.5 - 14.5 % Final  . Platelets 01/31/2016 420  150 - 440 K/uL Final  . Neutrophils Relative % 01/31/2016 77   Final  . Neutro Abs 01/31/2016 4.5  1.4 - 6.5 K/uL Final  . Lymphocytes Relative 01/31/2016 15   Final  . Lymphs Abs 01/31/2016 0.9* 1.0 - 3.6 K/uL Final  . Monocytes Relative 01/31/2016 6   Final  . Monocytes Absolute 01/31/2016 0.3  0.2 - 0.9 K/uL Final  . Eosinophils Relative 01/31/2016 2   Final  . Eosinophils Absolute 01/31/2016 0.1  0 - 0.7 K/uL Final  . Basophils Relative 01/31/2016 0  Final  . Basophils Absolute 01/31/2016 0.0  0 - 0.1 K/uL Final  . Sodium 01/31/2016 136  135 - 145 mmol/L Final  . Potassium 01/31/2016 4.0  3.5 - 5.1 mmol/L Final  . Chloride 01/31/2016 107  101 - 111 mmol/L Final  . CO2 01/31/2016 26  22 - 32 mmol/L Final  . Glucose, Bld 01/31/2016 117* 65 - 99 mg/dL Final  . BUN 01/31/2016 15  6 - 20 mg/dL Final  . Creatinine, Ser 01/31/2016 0.75  0.44 - 1.00 mg/dL Final  . Calcium 01/31/2016 8.9  8.9 - 10.3 mg/dL Final  . Total Protein 01/31/2016 6.8  6.5 - 8.1 g/dL Final  . Albumin 01/31/2016 4.1  3.5 - 5.0 g/dL Final  . AST 01/31/2016 22  15 - 41 U/L Final  . ALT 01/31/2016 30  14 - 54 U/L Final  . Alkaline Phosphatase 01/31/2016 80  38 - 126 U/L Final  . Total Bilirubin 01/31/2016 0.4  0.3 - 1.2 mg/dL Final  . GFR calc non Af Amer 01/31/2016 >60  >60 mL/min Final  . GFR calc Af Amer 01/31/2016 >60  >60 mL/min Final   Comment: (NOTE) The eGFR has been calculated using the CKD EPI equation. This calculation has not been validated in all clinical situations. eGFR's persistently <60 mL/min signify possible Chronic Kidney Disease.   . Anion gap 01/31/2016 3* 5 - 15 Final     Assessment:  JYLLIAN HAYNIE is a 50 y.o. female with clinical stage T2N1 (stage IIB) Her2/neu + right breast cancer status post biopsy.  She presented with a minimally painful right breast mass with nipple inversion.  Bilateral mammogram and ultrasound on 08/12/2015 revealed a 4.1 cm right superior breast mass containing pleomorphic microcalcifications. There were several satellite lesions (1.3 cm, 0.9 cm, 0.6 cm) plus a 1.3 cm mass versus complicated cyst and a 0.8 cm cyst.  There was one 5 mm right axillary node with irregular thickened cortex.   Left breast revealed a 1.5 cm cyst containing intramural nodule versus debris at the 9:00 position 2 cm from the nipple. There was also an indeterminate left breast 7 mm nodule at the 2:00 position 4 cm from the nipple.  Left breast biopsy at the 9:00 position on 08/17/2015 revealed intraductal papilloma with fibrocystic changes.   Core needle biopsy on 08/17/2015 of the right breast mass revealed a grade 3 invasive mammary carcinoma. Right axillary biopsy confirmed metastatic mammary carcinoma. Tumor was ER positive (1-10%), PR positive (11-50%) and HER-2/neu 3+  PET scan on 08/26/2015 revealed hypermetabolic right breast mass (SUV 11.3) with a hypermetabolic right axillary lymph node and a subtle focus of very faint hypermetabolic activity laterally in the right breast. There was faint hypermetabolic activity at the biopsy site of the left breast (? biopsy related).  CA27.29 was 19.6 on 08/22/2015.  She is s/p partial hysterectomy.  She is premenopausal ([place] 5 and estradiol 271.9) on 08/22/2015.  Echo on 08/26/2015 revealed an ejection fraction of 55-60%.  Echo on 11/25/2015 revealed an EF of 60-65%.  She has a family history of breast and ovarian cancer.  My Risk genetic testing revealed no mutation on 08/22/2015.  She received 4 cycles of AC (09/13/2015 - 11/08/2015) with Neulasta support. Right sided mammogram and ultrasound on 11/25/2015 revealed  extensive disease throughout the right breast. By ultrasound, the central mass was slightly smaller (4.1 x 2.3 x 4 cm to 3.1 x 1.0 x 2.4 cm) but with increased nipple retraction. An adjacent nodule  was slightly larger (1.2 x 1.0 x 1.3 cm to 1.3 x 1.1 x 1.2 cm).  The extent of calcifications was larger, measuring at least 3.8 x 3.1 x 8.1 cm.  She is s/p 3 cycles of Herceptin and Perjeta (11/29/2015 - 01/10/2016) and week #9 of Taxol (11/29/2015 - 01/10/2016).  She is tolerating her chemotherapy well.  She has a grade I neuropathy (stable).  Right breast ultrasound on 01/06/2016 revealed stable disease.  The lesion at the 12 o'clock position was 3 x 1 x 2.8 cm.  At the 9 o'clock position, one lesion was 1.6 x 0.9 x 1.2 cm.  A second lesion was 0.9 x 0.4 x 0.6 cm.  Chest CT angiogram on 12/27/2015 revealed no evidence of pulmonary embolism.  There were minor anterior bilateral upper lobe patchy and subpleural ground-glass opacities, nonspecific, suspect mild scattered alveolitis.  Symptomatically, she has some fatigue.  She has a grade I neuropathy.  Exam is stable.  Plan: 1.  Labs today:  CBC with diff, CMP, Mg 2.  Cycle #4 Herceptin and Perjeta today. 3.  Week #10 Taxol today. 4.  RTC weekly x 2 for labs and Taxol. 5.  Follow-up with Dr. Rochel Brome for pre-operative planning on 02/09/2016. 6.  Discuss patient's thoughts about surgery.  Considering double mastectomy. 7.  RTC in 3 weeks  for MD assessment, labs (CBC with diff, CMP, Mg), and Herceptin.   Lequita Asal, MD  01/31/2016, 9:27 AM

## 2016-01-31 NOTE — Progress Notes (Signed)
No concerns or changes since last visit

## 2016-02-01 ENCOUNTER — Encounter: Payer: Self-pay | Admitting: Pulmonary Disease

## 2016-02-01 ENCOUNTER — Ambulatory Visit (INDEPENDENT_AMBULATORY_CARE_PROVIDER_SITE_OTHER): Payer: 59 | Admitting: Pulmonary Disease

## 2016-02-01 ENCOUNTER — Ambulatory Visit
Admission: RE | Admit: 2016-02-01 | Discharge: 2016-02-01 | Disposition: A | Discharge status: Home / Self Care | Payer: 59 | Source: Ambulatory Visit | Attending: Pulmonary Disease | Admitting: Pulmonary Disease

## 2016-02-01 VITALS — BP 140/78 | HR 96 | Ht 61.0 in | Wt 111.0 lb

## 2016-02-01 DIAGNOSIS — M419 Scoliosis, unspecified: Secondary | ICD-10-CM | POA: Diagnosis not present

## 2016-02-01 DIAGNOSIS — R0602 Shortness of breath: Secondary | ICD-10-CM | POA: Diagnosis not present

## 2016-02-01 DIAGNOSIS — R0600 Dyspnea, unspecified: Secondary | ICD-10-CM | POA: Insufficient documentation

## 2016-02-05 NOTE — Progress Notes (Signed)
PULMONARY OFFICE CONSULT NOTE  Requesting MD/Service: [facility]/Oncology Date of initial consultation: 02/01/16 Reason for consultation: Dyspnea  PT PROFILE: 50 y.o. F with diagnosis of breast cancer made 08/2015 referred for evaluation of exertional dyspnea first noted after receiving Taxol 12/26/14.   HPI:  51 y.o. F who enjoyed good health other than severe scoliosis until Nov of 2016 when she was diagnosed with breast cancer. She has received 4 rounds of adriamycin and 10 treatments of Taxol. She first noted DOE 12/26/15 while ambulating at a normal pace into the grocery store. She describes it as difficulty obtaining a deep breath and tightness in her chest. She underwent a CT angiogram of chest on 3/14 at which time she was "a little better". She now describes herself as "pretty much better" and feels more impaired by fatigue than dyspnea. She believes that she has noted similar but less severe symptoms previously after receiving Taxol. She denies CP, fever, purulent sputum, hemoptysis, LE edema and calf tenderness. She does report nasal congestion and cough.   Past Medical History  Diagnosis Date  . Breast cancer ([place])   . Anxiety   . Depression   . Anemia     H/O  . Complication of anesthesia     HARD TO WAKE UP AFTER TONSILLECTOMY AS A CHILD BUT NO PROBLEMS SINCE    Past Surgical History  Procedure Laterality Date  . Breast biopsy Bilateral     08/17/2015   . Abdominal hysterectomy  2001  . Tonsillectomy    . Back surgery      FOR SCOLIOSIS-HERRINGTON RODS IN PLACE  . Portacath placement N/A 08/30/2015    Procedure: INSERTION PORT-A-CATH;  Surgeon: Leonie Green, MD;  Location: [facility] ORS;  Service: General;  Laterality: N/A;    MEDICATIONS: I have reviewed all medications and confirmed regimen as documented  Social History   Social History  . Marital Status: Married    Spouse Name: N/A  . Number of Children: N/A  . Years of Education: N/A   Occupational  History  . Not on file.   Social History Main Topics  . Smoking status: Former Smoker -- 1.00 packs/day for 10 years    Types: Cigarettes    Quit date: 10/16/1999  . Smokeless tobacco: Not on file  . Alcohol Use: Yes  . Drug Use: No  . Sexual Activity: Yes   Other Topics Concern  . Not on file   Social History Narrative    Family History  Problem Relation Age of Onset  . Breast cancer Maternal Aunt 64  . Cancer Father   . Cancer Sister   . Cancer Maternal Grandfather     ROS: No fever, myalgias/arthralgias, unexplained weight loss or weight gain No new focal weakness or sensory deficits No otalgia, hearing loss, visual changes, nasal and sinus symptoms, mouth and throat problems No neck pain or adenopathy No abdominal pain, N/V/D, diarrhea, change in bowel pattern No dysuria, change in urinary pattern No LE edema or calf tenderness   Filed Vitals:   02/01/16 0913  BP: 140/78  Pulse: 96  Height: 5\' 1"  (1.549 m)  Weight: 111 lb (50.349 kg)  SpO2: 98%     EXAM:  Gen: WDWN, No overt respiratory distress HEENT: NCAT, TMs and canals normal, sclera white, nares and nasal mucosa normal, oropharynx normal Neck: Supple without LAN, thyromegaly, JVD Lungs: breath sounds full, percussion note normal throughout, No adventitious sounds Cardiovascular: Normal rate, reg rhythm, no murmurs noted Abdomen: Soft, nontender,  normal BS Ext: without clubbing, cyanosis, edema Neuro: CNs grossly intact, motor and sensory intact, DTRs symmetric Skin: Limited exam, no lesions noted  DATA:   BMP Latest Ref Rng 01/31/2016 01/24/2016 01/17/2016  Glucose 65 - 99 mg/dL 117(H) 115(H) 125(H)  BUN 6 - 20 mg/dL 15 16 12   Creatinine 0.44 - 1.00 mg/dL 0.75 0.71 0.61  Sodium 135 - 145 mmol/L 136 138 138  Potassium 3.5 - 5.1 mmol/L 4.0 3.7 3.6  Chloride 101 - 111 mmol/L 107 109 104  CO2 22 - 32 mmol/L 26 25 25   Calcium 8.9 - 10.3 mg/dL 8.9 8.9 8.9    CBC Latest Ref Rng 01/31/2016 01/24/2016  01/17/2016  WBC 3.6 - 11.0 K/uL 5.8 7.2 6.7  Hemoglobin 12.0 - 16.0 g/dL 11.3(L) 11.0(L) 10.8(L)  Hematocrit 35.0 - 47.0 % 32.9(L) 32.1(L) 31.1(L)  Platelets 150 - 440 K/uL 420 437 387    CXR:  09/27/15: severe scoliosis, portacath present, NAD  CT chest: 12/27/15: No PE. Severe thoracic deformity due to scoliosis, minimal patchy GGOs  TTE: 11/25/15 - normal  IMPRESSION:     ICD-9-CM ICD-10-CM   1. Dyspnea 786.09 R06.00 DG Chest 2 View     Pulmonary function test  2. Severe scoliosis with thoracic deformity 3. Minimal ground glass opacities  Dyspnea is unexplained but is temporally related administration of Taxol. Respiratory complications of paclitaxel seem to be very rare. Dyspnea as a part of a hypersensitivity reaction is well described but her presentation is not consistent with such. Severe scoliosis gives her less pulmonary reserve and would make her more susceptible to the development of symptoms with minor pulmonary processes. The minimal pulmonary opacities are of unknown significance but might represent an adverse drug reaction   PLAN:  CXR today (02/01/16) ROV 4-6 weeks with CXR For now, I believe it is OK to proceed with further chemotherapy including Taxol as previously scheduled and we should monitor closely for further possible pulmonary complications. If this is a recurrent problem, especially if increasingly severe, we will need to consider an alternative therapeutic regimen   Merton Border, MD PCCM service Mobile [phone] Pager [phone] 02/05/2016  ADD: CXR 04/19 is without acute abnormalities  Merton Border, MD PCCM service Mobile [phone] Pager [phone] 02/05/2016

## 2016-02-06 ENCOUNTER — Telehealth: Payer: Self-pay | Admitting: *Deleted

## 2016-02-06 NOTE — Telephone Encounter (Signed)
-----   Message from Wilhelmina Mcardle, MD sent at 02/05/2016  2:53 PM EDT ----- PLease let pt know that CXR from 02/01/16 was without any worrisome findings  Waunita Schooner

## 2016-02-06 NOTE — Telephone Encounter (Signed)
Pt informed of results.

## 2016-02-07 ENCOUNTER — Encounter: Payer: Self-pay | Admitting: *Deleted

## 2016-02-07 ENCOUNTER — Inpatient Hospital Stay: Payer: 59

## 2016-02-07 ENCOUNTER — Ambulatory Visit: Payer: 59

## 2016-02-07 ENCOUNTER — Other Ambulatory Visit: Payer: Self-pay | Admitting: Hematology and Oncology

## 2016-02-07 ENCOUNTER — Telehealth: Payer: Self-pay | Admitting: *Deleted

## 2016-02-07 VITALS — BP 115/83 | HR 70 | Temp 98.0°F | Resp 20

## 2016-02-07 DIAGNOSIS — Z5111 Encounter for antineoplastic chemotherapy: Secondary | ICD-10-CM | POA: Diagnosis not present

## 2016-02-07 DIAGNOSIS — C50111 Malignant neoplasm of central portion of right female breast: Secondary | ICD-10-CM | POA: Diagnosis not present

## 2016-02-07 DIAGNOSIS — Z170 Estrogen receptor positive status [ER+]: Secondary | ICD-10-CM | POA: Diagnosis not present

## 2016-02-07 LAB — CBC WITH DIFFERENTIAL/PLATELET
Basophils Absolute: 0 10*3/uL (ref 0–0.1)
Basophils Relative: 1 %
Eosinophils Absolute: 0.1 10*3/uL (ref 0–0.7)
Eosinophils Relative: 1 %
HCT: 32.7 % — ABNORMAL LOW (ref 35.0–47.0)
Hemoglobin: 11.3 g/dL — ABNORMAL LOW (ref 12.0–16.0)
Lymphocytes Relative: 13 %
Lymphs Abs: 0.9 10*3/uL — ABNORMAL LOW (ref 1.0–3.6)
MCH: 32.2 pg (ref 26.0–34.0)
MCHC: 34.5 g/dL (ref 32.0–36.0)
MCV: 93.4 fL (ref 80.0–100.0)
Monocytes Absolute: 0.4 10*3/uL (ref 0.2–0.9)
Monocytes Relative: 5 %
Neutro Abs: 5.4 10*3/uL (ref 1.4–6.5)
Neutrophils Relative %: 80 %
Platelets: 361 10*3/uL (ref 150–440)
RBC: 3.5 MIL/uL — ABNORMAL LOW (ref 3.80–5.20)
RDW: 13.4 % (ref 11.5–14.5)
WBC: 6.8 10*3/uL (ref 3.6–11.0)

## 2016-02-07 LAB — COMPREHENSIVE METABOLIC PANEL
ALT: 21 U/L (ref 14–54)
AST: 20 U/L (ref 15–41)
Albumin: 3.9 g/dL (ref 3.5–5.0)
Alkaline Phosphatase: 68 U/L (ref 38–126)
Anion gap: 6 (ref 5–15)
BUN: 13 mg/dL (ref 6–20)
CO2: 26 mmol/L (ref 22–32)
Calcium: 9 mg/dL (ref 8.9–10.3)
Chloride: 107 mmol/L (ref 101–111)
Creatinine, Ser: 0.68 mg/dL (ref 0.44–1.00)
GFR calc Af Amer: >60 mL/min (ref >60)
GFR calc non Af Amer: >60 mL/min (ref >60)
Glucose, Bld: 105 mg/dL — ABNORMAL HIGH (ref 65–99)
Potassium: 4.1 mmol/L (ref 3.5–5.1)
Sodium: 139 mmol/L (ref 135–145)
Total Bilirubin: 0.5 mg/dL (ref 0.3–1.2)
Total Protein: 6.5 g/dL (ref 6.5–8.1)

## 2016-02-07 MED ORDER — SODIUM CHLORIDE 0.9 % IV SOLN
Freq: Once | INTRAVENOUS | Status: AC
  Administered 2016-02-07: 10:00:00 via INTRAVENOUS
  Filled 2016-02-07: qty 1000

## 2016-02-07 MED ORDER — SODIUM CHLORIDE 0.9 % IV SOLN
20.0000 mg | Freq: Once | INTRAVENOUS | Status: AC
  Administered 2016-02-07: 20 mg via INTRAVENOUS
  Filled 2016-02-07: qty 2

## 2016-02-07 MED ORDER — PACLITAXEL CHEMO INJECTION 300 MG/50ML
80.0000 mg/m2 | Freq: Once | INTRAVENOUS | Status: AC
  Administered 2016-02-07: 120 mg via INTRAVENOUS
  Filled 2016-02-07: qty 20

## 2016-02-07 MED ORDER — HEPARIN SOD (PORK) LOCK FLUSH 100 UNIT/ML IV SOLN
500.0000 [IU] | Freq: Once | INTRAVENOUS | Status: AC | PRN
  Administered 2016-02-07: 500 [IU]
  Filled 2016-02-07: qty 5

## 2016-02-07 MED ORDER — FAMOTIDINE IN NACL 20-0.9 MG/50ML-% IV SOLN
20.0000 mg | Freq: Once | INTRAVENOUS | Status: AC
  Administered 2016-02-07: 20 mg via INTRAVENOUS
  Filled 2016-02-07: qty 50

## 2016-02-07 MED ORDER — SODIUM CHLORIDE 0.9% FLUSH
10.0000 mL | INTRAVENOUS | Status: DC | PRN
  Administered 2016-02-07: 10 mL
  Filled 2016-02-07: qty 10

## 2016-02-07 MED ORDER — DIPHENHYDRAMINE HCL 50 MG/ML IJ SOLN
50.0000 mg | Freq: Once | INTRAMUSCULAR | Status: AC
  Administered 2016-02-07: 50 mg via INTRAVENOUS
  Filled 2016-02-07: qty 1

## 2016-02-07 NOTE — Telephone Encounter (Signed)
Faxed form for [employer] review for pt to be out of work while on chemo and then pt will give form to surgeon for more leave with her surgery

## 2016-02-07 NOTE — Progress Notes (Signed)
  Oncology Nurse Navigator Documentation  Navigator Location: [facility] (02/07/16 1200) Navigator Encounter Type: Treatment (02/07/16 1200)           Patient Visit Type: Other (02/07/16 1200) Treatment Phase: Active Tx (02/07/16 1200) Barriers/Navigation Needs: No barriers at this time (02/07/16 1200)                     Met patient and her husband during her chemo treatment.  States she has one last treatment.  No needs at this time.

## 2016-02-09 DIAGNOSIS — C50811 Malignant neoplasm of overlapping sites of right female breast: Secondary | ICD-10-CM | POA: Diagnosis not present

## 2016-02-09 DIAGNOSIS — N63 Unspecified lump in breast: Secondary | ICD-10-CM | POA: Diagnosis not present

## 2016-02-13 ENCOUNTER — Other Ambulatory Visit: Payer: Self-pay | Admitting: Surgery

## 2016-02-13 DIAGNOSIS — C50912 Malignant neoplasm of unspecified site of left female breast: Principal | ICD-10-CM

## 2016-02-13 DIAGNOSIS — C50911 Malignant neoplasm of unspecified site of right female breast: Secondary | ICD-10-CM

## 2016-02-14 ENCOUNTER — Inpatient Hospital Stay: Payer: 59 | Attending: Hematology and Oncology

## 2016-02-14 ENCOUNTER — Encounter: Payer: Self-pay | Admitting: *Deleted

## 2016-02-14 ENCOUNTER — Other Ambulatory Visit: Payer: Self-pay | Admitting: Hematology and Oncology

## 2016-02-14 ENCOUNTER — Ambulatory Visit: Payer: 59

## 2016-02-14 ENCOUNTER — Inpatient Hospital Stay: Payer: 59

## 2016-02-14 VITALS — BP 132/82 | HR 84 | Temp 97.7°F | Resp 20

## 2016-02-14 DIAGNOSIS — Z803 Family history of malignant neoplasm of breast: Secondary | ICD-10-CM | POA: Diagnosis not present

## 2016-02-14 DIAGNOSIS — Z90710 Acquired absence of both cervix and uterus: Secondary | ICD-10-CM | POA: Insufficient documentation

## 2016-02-14 DIAGNOSIS — C773 Secondary and unspecified malignant neoplasm of axilla and upper limb lymph nodes: Secondary | ICD-10-CM | POA: Diagnosis not present

## 2016-02-14 DIAGNOSIS — D242 Benign neoplasm of left breast: Secondary | ICD-10-CM | POA: Diagnosis not present

## 2016-02-14 DIAGNOSIS — G629 Polyneuropathy, unspecified: Secondary | ICD-10-CM | POA: Diagnosis not present

## 2016-02-14 DIAGNOSIS — R439 Unspecified disturbances of smell and taste: Secondary | ICD-10-CM | POA: Insufficient documentation

## 2016-02-14 DIAGNOSIS — Z87891 Personal history of nicotine dependence: Secondary | ICD-10-CM | POA: Diagnosis not present

## 2016-02-14 DIAGNOSIS — R918 Other nonspecific abnormal finding of lung field: Secondary | ICD-10-CM | POA: Insufficient documentation

## 2016-02-14 DIAGNOSIS — Z79899 Other long term (current) drug therapy: Secondary | ICD-10-CM | POA: Diagnosis not present

## 2016-02-14 DIAGNOSIS — C50111 Malignant neoplasm of central portion of right female breast: Secondary | ICD-10-CM

## 2016-02-14 DIAGNOSIS — C50811 Malignant neoplasm of overlapping sites of right female breast: Secondary | ICD-10-CM | POA: Diagnosis not present

## 2016-02-14 DIAGNOSIS — Z862 Personal history of diseases of the blood and blood-forming organs and certain disorders involving the immune mechanism: Secondary | ICD-10-CM | POA: Diagnosis not present

## 2016-02-14 DIAGNOSIS — Z170 Estrogen receptor positive status [ER+]: Secondary | ICD-10-CM | POA: Diagnosis not present

## 2016-02-14 DIAGNOSIS — Z9221 Personal history of antineoplastic chemotherapy: Secondary | ICD-10-CM | POA: Diagnosis not present

## 2016-02-14 DIAGNOSIS — Z5112 Encounter for antineoplastic immunotherapy: Secondary | ICD-10-CM | POA: Diagnosis not present

## 2016-02-14 DIAGNOSIS — Z808 Family history of malignant neoplasm of other organs or systems: Secondary | ICD-10-CM | POA: Insufficient documentation

## 2016-02-14 DIAGNOSIS — Z8041 Family history of malignant neoplasm of ovary: Secondary | ICD-10-CM | POA: Insufficient documentation

## 2016-02-14 DIAGNOSIS — F418 Other specified anxiety disorders: Secondary | ICD-10-CM | POA: Diagnosis not present

## 2016-02-14 LAB — CBC WITH DIFFERENTIAL/PLATELET
Basophils Absolute: 0 10*3/uL (ref 0–0.1)
Basophils Relative: 0 %
Eosinophils Absolute: 0.1 10*3/uL (ref 0–0.7)
Eosinophils Relative: 2 %
HCT: 31.9 % — ABNORMAL LOW (ref 35.0–47.0)
Hemoglobin: 10.9 g/dL — ABNORMAL LOW (ref 12.0–16.0)
Lymphocytes Relative: 14 %
Lymphs Abs: 0.8 10*3/uL — ABNORMAL LOW (ref 1.0–3.6)
MCH: 32.1 pg (ref 26.0–34.0)
MCHC: 34.1 g/dL (ref 32.0–36.0)
MCV: 94.2 fL (ref 80.0–100.0)
Monocytes Absolute: 0.4 10*3/uL (ref 0.2–0.9)
Monocytes Relative: 6 %
Neutro Abs: 4.7 10*3/uL (ref 1.4–6.5)
Neutrophils Relative %: 78 %
Platelets: 315 10*3/uL (ref 150–440)
RBC: 3.39 MIL/uL — ABNORMAL LOW (ref 3.80–5.20)
RDW: 13.6 % (ref 11.5–14.5)
WBC: 6 10*3/uL (ref 3.6–11.0)

## 2016-02-14 LAB — COMPREHENSIVE METABOLIC PANEL
ALT: 20 U/L (ref 14–54)
AST: 20 U/L (ref 15–41)
Albumin: 3.9 g/dL (ref 3.5–5.0)
Alkaline Phosphatase: 62 U/L (ref 38–126)
Anion gap: 5 (ref 5–15)
BUN: 12 mg/dL (ref 6–20)
CO2: 26 mmol/L (ref 22–32)
Calcium: 9.1 mg/dL (ref 8.9–10.3)
Chloride: 109 mmol/L (ref 101–111)
Creatinine, Ser: 0.54 mg/dL (ref 0.44–1.00)
GFR calc Af Amer: >60 mL/min (ref >60)
GFR calc non Af Amer: >60 mL/min (ref >60)
Glucose, Bld: 112 mg/dL — ABNORMAL HIGH (ref 65–99)
Potassium: 3.9 mmol/L (ref 3.5–5.1)
Sodium: 140 mmol/L (ref 135–145)
Total Bilirubin: 0.4 mg/dL (ref 0.3–1.2)
Total Protein: 6.5 g/dL (ref 6.5–8.1)

## 2016-02-14 MED ORDER — DIPHENHYDRAMINE HCL 50 MG/ML IJ SOLN
50.0000 mg | Freq: Once | INTRAMUSCULAR | Status: AC
  Administered 2016-02-14: 50 mg via INTRAVENOUS
  Filled 2016-02-14: qty 1

## 2016-02-14 MED ORDER — SODIUM CHLORIDE 0.9 % IV SOLN
80.0000 mg/m2 | Freq: Once | INTRAVENOUS | Status: AC
  Administered 2016-02-14: 120 mg via INTRAVENOUS
  Filled 2016-02-14: qty 20

## 2016-02-14 MED ORDER — FAMOTIDINE IN NACL 20-0.9 MG/50ML-% IV SOLN
20.0000 mg | Freq: Once | INTRAVENOUS | Status: AC
  Administered 2016-02-14: 20 mg via INTRAVENOUS
  Filled 2016-02-14: qty 50

## 2016-02-14 MED ORDER — SODIUM CHLORIDE 0.9 % IV SOLN
20.0000 mg | Freq: Once | INTRAVENOUS | Status: AC
  Administered 2016-02-14: 20 mg via INTRAVENOUS
  Filled 2016-02-14: qty 2

## 2016-02-14 MED ORDER — HEPARIN SOD (PORK) LOCK FLUSH 100 UNIT/ML IV SOLN
500.0000 [IU] | Freq: Once | INTRAVENOUS | Status: AC | PRN
  Administered 2016-02-14: 500 [IU]
  Filled 2016-02-14: qty 5

## 2016-02-14 MED ORDER — SODIUM CHLORIDE 0.9 % IV SOLN
Freq: Once | INTRAVENOUS | Status: AC
  Administered 2016-02-14: 10:00:00 via INTRAVENOUS
  Filled 2016-02-14: qty 1000

## 2016-02-21 ENCOUNTER — Inpatient Hospital Stay: Payer: 59

## 2016-02-21 ENCOUNTER — Encounter: Payer: Self-pay | Admitting: Hematology and Oncology

## 2016-02-21 ENCOUNTER — Other Ambulatory Visit: Payer: Self-pay | Admitting: Hematology and Oncology

## 2016-02-21 ENCOUNTER — Inpatient Hospital Stay (HOSPITAL_BASED_OUTPATIENT_CLINIC_OR_DEPARTMENT_OTHER): Payer: 59 | Admitting: Hematology and Oncology

## 2016-02-21 VITALS — BP 146/72 | HR 99 | Temp 96.5°F | Resp 18 | Ht 61.0 in | Wt 113.6 lb

## 2016-02-21 DIAGNOSIS — R439 Unspecified disturbances of smell and taste: Secondary | ICD-10-CM | POA: Diagnosis not present

## 2016-02-21 DIAGNOSIS — R918 Other nonspecific abnormal finding of lung field: Secondary | ICD-10-CM

## 2016-02-21 DIAGNOSIS — C773 Secondary and unspecified malignant neoplasm of axilla and upper limb lymph nodes: Secondary | ICD-10-CM

## 2016-02-21 DIAGNOSIS — D242 Benign neoplasm of left breast: Secondary | ICD-10-CM | POA: Diagnosis not present

## 2016-02-21 DIAGNOSIS — C50111 Malignant neoplasm of central portion of right female breast: Secondary | ICD-10-CM

## 2016-02-21 DIAGNOSIS — Z8041 Family history of malignant neoplasm of ovary: Secondary | ICD-10-CM

## 2016-02-21 DIAGNOSIS — Z9221 Personal history of antineoplastic chemotherapy: Secondary | ICD-10-CM

## 2016-02-21 DIAGNOSIS — Z90710 Acquired absence of both cervix and uterus: Secondary | ICD-10-CM

## 2016-02-21 DIAGNOSIS — C50811 Malignant neoplasm of overlapping sites of right female breast: Secondary | ICD-10-CM | POA: Diagnosis not present

## 2016-02-21 DIAGNOSIS — Z170 Estrogen receptor positive status [ER+]: Secondary | ICD-10-CM

## 2016-02-21 DIAGNOSIS — Z862 Personal history of diseases of the blood and blood-forming organs and certain disorders involving the immune mechanism: Secondary | ICD-10-CM

## 2016-02-21 DIAGNOSIS — G629 Polyneuropathy, unspecified: Secondary | ICD-10-CM | POA: Diagnosis not present

## 2016-02-21 DIAGNOSIS — F418 Other specified anxiety disorders: Secondary | ICD-10-CM

## 2016-02-21 DIAGNOSIS — Z803 Family history of malignant neoplasm of breast: Secondary | ICD-10-CM

## 2016-02-21 DIAGNOSIS — Z87891 Personal history of nicotine dependence: Secondary | ICD-10-CM

## 2016-02-21 DIAGNOSIS — Z808 Family history of malignant neoplasm of other organs or systems: Secondary | ICD-10-CM

## 2016-02-21 DIAGNOSIS — Z79899 Other long term (current) drug therapy: Secondary | ICD-10-CM

## 2016-02-21 DIAGNOSIS — Z5112 Encounter for antineoplastic immunotherapy: Secondary | ICD-10-CM | POA: Diagnosis not present

## 2016-02-21 LAB — CBC WITH DIFFERENTIAL/PLATELET
Basophils Absolute: 0 10*3/uL (ref 0–0.1)
Basophils Relative: 0 %
Eosinophils Absolute: 0.1 10*3/uL (ref 0–0.7)
Eosinophils Relative: 1 %
HCT: 34.3 % — ABNORMAL LOW (ref 35.0–47.0)
Hemoglobin: 11.8 g/dL — ABNORMAL LOW (ref 12.0–16.0)
Lymphocytes Relative: 18 %
Lymphs Abs: 1.3 10*3/uL (ref 1.0–3.6)
MCH: 31.8 pg (ref 26.0–34.0)
MCHC: 34.4 g/dL (ref 32.0–36.0)
MCV: 92.4 fL (ref 80.0–100.0)
Monocytes Absolute: 0.4 10*3/uL (ref 0.2–0.9)
Monocytes Relative: 5 %
Neutro Abs: 5.5 10*3/uL (ref 1.4–6.5)
Neutrophils Relative %: 76 %
Platelets: 413 10*3/uL (ref 150–440)
RBC: 3.72 MIL/uL — ABNORMAL LOW (ref 3.80–5.20)
RDW: 13.6 % (ref 11.5–14.5)
WBC: 7.3 10*3/uL (ref 3.6–11.0)

## 2016-02-21 LAB — COMPREHENSIVE METABOLIC PANEL
ALT: 18 U/L (ref 14–54)
AST: 17 U/L (ref 15–41)
Albumin: 4.3 g/dL (ref 3.5–5.0)
Alkaline Phosphatase: 75 U/L (ref 38–126)
Anion gap: 8 (ref 5–15)
BUN: 15 mg/dL (ref 6–20)
CO2: 25 mmol/L (ref 22–32)
Calcium: 9.2 mg/dL (ref 8.9–10.3)
Chloride: 104 mmol/L (ref 101–111)
Creatinine, Ser: 0.67 mg/dL (ref 0.44–1.00)
GFR calc Af Amer: >60 mL/min (ref >60)
GFR calc non Af Amer: >60 mL/min (ref >60)
Glucose, Bld: 109 mg/dL — ABNORMAL HIGH (ref 65–99)
Potassium: 3.6 mmol/L (ref 3.5–5.1)
Sodium: 137 mmol/L (ref 135–145)
Total Bilirubin: 0.7 mg/dL (ref 0.3–1.2)
Total Protein: 7 g/dL (ref 6.5–8.1)

## 2016-02-21 MED ORDER — SODIUM CHLORIDE 0.9% FLUSH
10.0000 mL | INTRAVENOUS | Status: DC | PRN
  Administered 2016-02-21: 10 mL
  Filled 2016-02-21: qty 10

## 2016-02-21 MED ORDER — HEPARIN SOD (PORK) LOCK FLUSH 100 UNIT/ML IV SOLN
500.0000 [IU] | Freq: Once | INTRAVENOUS | Status: AC | PRN
  Administered 2016-02-21: 500 [IU]
  Filled 2016-02-21: qty 5

## 2016-02-21 NOTE — Progress Notes (Signed)
[facility] day:  02/21/2016   Chief Complaint: Emma Atkinson is a 50 y.o. female with clinical stage IIB Her2/neu+ right breast cancer who is seen for assessment prior to every 3 week Herceptin.  HPI:  The patient was last seen in the medical oncology clinic on 01/31/2016.  At that time, she received cycle #4 neoadjuvant Herceptin and Perjeta and week #10 Taxol.  She received week #11 and #12 on 02/07/2016 and 02/14/2016.  She has done well.  Her neuropathy remains minimal.  Her taste sensation remains off.  She is scheduled for surgery on 03/08/2016.  She is concerned about the timing of her every 3 week Herceptin after surgery and how she will feel.  She is wondering if she needs her pulmonary function tests as her respiratory symptoms have resolved.  She is inquiring about radiation.   Past Medical History  Diagnosis Date  . Breast cancer ([place])   . Anxiety   . Depression   . Anemia     H/O  . Complication of anesthesia     HARD TO WAKE UP AFTER TONSILLECTOMY AS A CHILD BUT NO PROBLEMS SINCE    Past Surgical History  Procedure Laterality Date  . Breast biopsy Bilateral     08/17/2015   . Abdominal hysterectomy  2001  . Tonsillectomy    . Back surgery      FOR SCOLIOSIS-HERRINGTON RODS IN PLACE  . Portacath placement N/A 08/30/2015    Procedure: INSERTION PORT-A-CATH;  Surgeon: Leonie Green, MD;  Location: [facility] ORS;  Service: General;  Laterality: N/A;    Family History  Problem Relation Age of Onset  . Breast cancer Maternal Aunt 64  . Cancer Father   . Cancer Sister   . Cancer Maternal Grandfather     Social History:  reports that she quit smoking about 16 years ago. Her smoking use included Cigarettes. She has a 10 pack-year smoking history. She does not have any smokeless tobacco history on file. She reports that she drinks alcohol. She reports that she does not use illicit drugs.  The patient is accompanied by her  husband today.  Allergies:  Allergies  Allergen Reactions  . Ceftin [Cefuroxime Axetil] Rash    Current Medications: Current Outpatient Prescriptions  Medication Sig Dispense Refill  . ALPRAZolam (XANAX) 0.25 MG tablet Take 1/4 tablet up to three times daily as needed    . Dexlansoprazole (DEXILANT) 30 MG capsule Take 30 mg by mouth daily.    . diphenhydrAMINE (SOMINEX) 25 MG tablet Take 25 mg by mouth at bedtime. Reported on 01/12/2016    . fluconazole (DIFLUCAN) 150 MG tablet Take 1 tablet (150 mg total) by mouth once. 1 tablet 0  . lidocaine-prilocaine (EMLA) cream Apply 1 application topically as needed. Apply at least 1-2 hours before the port is to be used. 30 g 1  . LORazepam (ATIVAN) 0.5 MG tablet Take 1 tablet (0.5 mg total) by mouth every 6 (six) hours as needed (Nausea or vomiting). 30 tablet 0  . Multiple Vitamin (MULTIVITAMIN) capsule Take 1 capsule by mouth daily.    . ondansetron (ZOFRAN) 4 MG tablet Take 4 mg by mouth every 8 (eight) hours as needed for nausea or vomiting.     No current facility-administered medications for this visit.   Facility-Administered Medications Ordered in Other Visits  Medication Dose Route Frequency Provider Last Rate Last Dose  . heparin lock flush 100 unit/mL  500 Units  Intracatheter Once PRN Lequita Asal, MD      . sodium chloride flush (NS) 0.9 % injection 10 mL  10 mL Intracatheter PRN Lequita Asal, MD   10 mL at 02/21/16 1324    Review of Systems:  GENERAL:  Feels good.  No fevers or sweats.  Weight up 2 pounds. PERFORMANCE STATUS (ECOG):  1 HEENT:  Change in taste.  No visual changes, runny nose, sore throat, mouth sores or tenderness. Lungs: No shortness of breath or cough.  No pleuritic chest pain.  No hemoptysis. Cardiac:  No chest pain, palpitations, orthopnea, or PND. GI:  Appetite improved.  No nausea, vomiting, diarrhea, constipation, melena or hematochezia. GU:  No urgency, frequency, dysuria, or  hematuria. Musculoskeletal:  No back pain.  No joint pain.  No muscle tenderness. Extremities:  No pain or swelling. Skin:  No rashes or skin changes. Neuro:  Little numbness/tingling in tips of fingers.  No weakness, balance or coordination issues. Endocrine:  No diabetes, thyroid issues, hot flashes or night sweats. Psych:  No mood changes. Pain:  No focal pain. Review of systems:  All other systems reviewed and found to be negative.  Physical Exam: Blood pressure 146/72, pulse 99, temperature 96.5 F (35.8 C), temperature source Tympanic, resp. rate 18, height '5\' 1"'  (1.549 m), weight 113 lb 10.4 oz (51.55 kg). O2 sats 94% with ambulation. GENERAL:  Thin woman sitting comfortably in the exam room in no acute distress.  MENTAL STATUS:  Alert and oriented to person, place and time. HEAD:  Wearing a baseball cap.  Long blonde wig.  Normocephalic, atraumatic, face symmetric (thin), no Cushingoid features. EYES:  Blue eyes.  Pupils equal round and reactive to light and accomodation.  No conjunctivitis or scleral icterus. ENT:  Oropharynx clear without lesion.  Tongue normal. Mucous membranes moist.  RESPIRATORY:  Clear to auscultation without rales, wheezes or rhonchi. CARDIOVASCULAR:  Regular rate and rhythm without murmur, rub or gallop. No JVD. ABDOMEN:  Soft, non-tender, with active bowel sounds, and no hepatosplenomegaly.  No masses. SKIN:  No rashes, ulcers or lesions. EXTREMITIES: No edema, no skin discoloration or tenderness.  No palpable cords. LYMPH NODES: No palpable cervical, supraclavicular, axillary or inguinal adenopathy  NEUROLOGICAL: Unremarkable. PSYCH:  Appropriate.   Infusion on 02/21/2016  Component Date Value Ref Range Status  . WBC 02/21/2016 7.3  3.6 - 11.0 K/uL Final  . RBC 02/21/2016 3.72* 3.80 - 5.20 MIL/uL Final  . Hemoglobin 02/21/2016 11.8* 12.0 - 16.0 g/dL Final  . HCT 02/21/2016 34.3* 35.0 - 47.0 % Final  . MCV 02/21/2016 92.4  80.0 - 100.0 fL Final  .  MCH 02/21/2016 31.8  26.0 - 34.0 pg Final  . MCHC 02/21/2016 34.4  32.0 - 36.0 g/dL Final  . RDW 02/21/2016 13.6  11.5 - 14.5 % Final  . Platelets 02/21/2016 413  150 - 440 K/uL Final  . Neutrophils Relative % 02/21/2016 76   Final  . Neutro Abs 02/21/2016 5.5  1.4 - 6.5 K/uL Final  . Lymphocytes Relative 02/21/2016 18   Final  . Lymphs Abs 02/21/2016 1.3  1.0 - 3.6 K/uL Final  . Monocytes Relative 02/21/2016 5   Final  . Monocytes Absolute 02/21/2016 0.4  0.2 - 0.9 K/uL Final  . Eosinophils Relative 02/21/2016 1   Final  . Eosinophils Absolute 02/21/2016 0.1  0 - 0.7 K/uL Final  . Basophils Relative 02/21/2016 0   Final  . Basophils Absolute 02/21/2016 0.0  0 -  0.1 K/uL Final  . Sodium 02/21/2016 137  135 - 145 mmol/L Final  . Potassium 02/21/2016 3.6  3.5 - 5.1 mmol/L Final  . Chloride 02/21/2016 104  101 - 111 mmol/L Final  . CO2 02/21/2016 25  22 - 32 mmol/L Final  . Glucose, Bld 02/21/2016 109* 65 - 99 mg/dL Final  . BUN 02/21/2016 15  6 - 20 mg/dL Final  . Creatinine, Ser 02/21/2016 0.67  0.44 - 1.00 mg/dL Final  . Calcium 02/21/2016 9.2  8.9 - 10.3 mg/dL Final  . Total Protein 02/21/2016 7.0  6.5 - 8.1 g/dL Final  . Albumin 02/21/2016 4.3  3.5 - 5.0 g/dL Final  . AST 02/21/2016 17  15 - 41 U/L Final  . ALT 02/21/2016 18  14 - 54 U/L Final  . Alkaline Phosphatase 02/21/2016 75  38 - 126 U/L Final  . Total Bilirubin 02/21/2016 0.7  0.3 - 1.2 mg/dL Final  . GFR calc non Af Amer 02/21/2016 >60  >60 mL/min Final  . GFR calc Af Amer 02/21/2016 >60  >60 mL/min Final   Comment: (NOTE) The eGFR has been calculated using the CKD EPI equation. This calculation has not been validated in all clinical situations. eGFR's persistently <60 mL/min signify possible Chronic Kidney Disease.   . Anion gap 02/21/2016 8  5 - 15 Final    Assessment:  Emma Atkinson is a 50 y.o. female with clinical stage T2N1 (stage IIB) Her2/neu + right breast cancer status post biopsy.  She presented with a  minimally painful right breast mass with nipple inversion.  Bilateral mammogram and ultrasound on 08/12/2015 revealed a 4.1 cm right superior breast mass containing pleomorphic microcalcifications. There were several satellite lesions (1.3 cm, 0.9 cm, 0.6 cm) plus a 1.3 cm mass versus complicated cyst and a 0.8 cm cyst.  There was one 5 mm right axillary node with irregular thickened cortex.   Left breast revealed a 1.5 cm cyst containing intramural nodule versus debris at the 9:00 position 2 cm from the nipple. There was also an indeterminate left breast 7 mm nodule at the 2:00 position 4 cm from the nipple.  Left breast biopsy at the 9:00 position on 08/17/2015 revealed intraductal papilloma with fibrocystic changes.   Core needle biopsy on 08/17/2015 of the right breast mass revealed a grade 3 invasive mammary carcinoma. Right axillary biopsy confirmed metastatic mammary carcinoma. Tumor was ER positive (1-10%), PR positive (11-50%) and HER-2/neu 3+  PET scan on 08/26/2015 revealed hypermetabolic right breast mass (SUV 11.3) with a hypermetabolic right axillary lymph node and a subtle focus of very faint hypermetabolic activity laterally in the right breast. There was faint hypermetabolic activity at the biopsy site of the left breast (? biopsy related).  CA27.29 was 19.6 on 08/22/2015.  She is s/p partial hysterectomy.  She is premenopausal ([place] 5 and estradiol 271.9) on 08/22/2015.  Echo on 08/26/2015 revealed an ejection fraction of 55-60%.  Echo on 11/25/2015 revealed an EF of 60-65%.  She has a family history of breast and ovarian cancer.  My Risk genetic testing revealed no mutation on 08/22/2015.  She received 4 cycles of AC (09/13/2015 - 11/08/2015) with Neulasta support. Right sided mammogram and ultrasound on 11/25/2015 revealed extensive disease throughout the right breast. By ultrasound, the central mass was slightly smaller (4.1 x 2.3 x 4 cm to 3.1 x 1.0 x 2.4 cm) but with increased  nipple retraction. An adjacent nodule was slightly larger (1.2 x 1.0 x 1.3 cm  to 1.3 x 1.1 x 1.2 cm).  The extent of calcifications was larger, measuring at least 3.8 x 3.1 x 8.1 cm.  She received 4 cycles of Herceptin and Perjeta (11/29/2015 - 01/31/2016) and 12 weeks of Taxol (11/29/2015 - 02/14/2016).  She is tolerating her chemotherapy well.  She has a grade I neuropathy (stable).  Right breast ultrasound on 01/06/2016 revealed stable disease.  The lesion at the 12 o'clock position was 3 x 1 x 2.8 cm.  At the 9 o'clock position, one lesion was 1.6 x 0.9 x 1.2 cm.  A second lesion was 0.9 x 0.4 x 0.6 cm.  Chest CT angiogram on 12/27/2015 revealed no evidence of pulmonary embolism.  There were minor anterior bilateral upper lobe patchy and subpleural ground-glass opacities, nonspecific, suspect mild scattered alveolitis.  Symptomatically, she has had a change in taste sensation.  She has a grade I neuropathy.  Exam is stable.  Plan: 1.  Labs today:  CBC with diff, CMP. 2.  Discuss timing of surgery, Herceptin, and need for echocardiogram.  Discuss slight modification in schedule. 3.  Postpone Herceptin today. 4.  Echo on 02/23/2016. 5.  Herceptin on 02/24/2016. 6.  Anticipate bilateral mastectomy on 03/08/2016. 7.  RTC on 03/23/2016 for MD assessment, labs (CBC with diff, CMP, CA27.29), review of pathology, and Herceptin.  Addendum:  Echo on 02/23/2016 revealed an EF of 55-65%.   Lequita Asal, MD  02/21/2016, 2:14 PM

## 2016-02-21 NOTE — Progress Notes (Signed)
Concerned about when taste may return.  Anxious about surgery.  Surgery is scheduled for the 25 th of May and next herceptin is due on the 59 th pt concerned they are to close together.  Will she need absolutely need radiation?  Last concern is does she really still need Pulmonary function test?

## 2016-02-23 ENCOUNTER — Ambulatory Visit
Admission: RE | Admit: 2016-02-23 | Discharge: 2016-02-23 | Disposition: A | Discharge status: Home / Self Care | Payer: 59 | Source: Ambulatory Visit | Attending: Hematology and Oncology | Admitting: Hematology and Oncology

## 2016-02-23 DIAGNOSIS — I071 Rheumatic tricuspid insufficiency: Secondary | ICD-10-CM | POA: Insufficient documentation

## 2016-02-23 DIAGNOSIS — C50811 Malignant neoplasm of overlapping sites of right female breast: Secondary | ICD-10-CM | POA: Diagnosis not present

## 2016-02-23 DIAGNOSIS — F419 Anxiety disorder, unspecified: Secondary | ICD-10-CM | POA: Insufficient documentation

## 2016-02-23 DIAGNOSIS — D649 Anemia, unspecified: Secondary | ICD-10-CM | POA: Insufficient documentation

## 2016-02-23 NOTE — Progress Notes (Signed)
*  PRELIMINARY RESULTS* Echocardiogram 2D Echocardiogram has been performed.  Emma Atkinson 02/23/2016, 11:38 AM

## 2016-02-24 ENCOUNTER — Other Ambulatory Visit: Payer: Self-pay | Admitting: Hematology and Oncology

## 2016-02-24 ENCOUNTER — Inpatient Hospital Stay: Payer: 59

## 2016-02-24 DIAGNOSIS — G629 Polyneuropathy, unspecified: Secondary | ICD-10-CM | POA: Diagnosis not present

## 2016-02-24 DIAGNOSIS — D242 Benign neoplasm of left breast: Secondary | ICD-10-CM | POA: Diagnosis not present

## 2016-02-24 DIAGNOSIS — C773 Secondary and unspecified malignant neoplasm of axilla and upper limb lymph nodes: Secondary | ICD-10-CM | POA: Diagnosis not present

## 2016-02-24 DIAGNOSIS — C50811 Malignant neoplasm of overlapping sites of right female breast: Secondary | ICD-10-CM | POA: Diagnosis not present

## 2016-02-24 DIAGNOSIS — Z5112 Encounter for antineoplastic immunotherapy: Secondary | ICD-10-CM | POA: Diagnosis not present

## 2016-02-24 DIAGNOSIS — C50111 Malignant neoplasm of central portion of right female breast: Secondary | ICD-10-CM

## 2016-02-24 DIAGNOSIS — F418 Other specified anxiety disorders: Secondary | ICD-10-CM | POA: Diagnosis not present

## 2016-02-24 DIAGNOSIS — R918 Other nonspecific abnormal finding of lung field: Secondary | ICD-10-CM | POA: Diagnosis not present

## 2016-02-24 DIAGNOSIS — Z170 Estrogen receptor positive status [ER+]: Secondary | ICD-10-CM | POA: Diagnosis not present

## 2016-02-24 DIAGNOSIS — R439 Unspecified disturbances of smell and taste: Secondary | ICD-10-CM | POA: Diagnosis not present

## 2016-02-24 MED ORDER — SODIUM CHLORIDE 0.9 % IV SOLN
Freq: Once | INTRAVENOUS | Status: AC
  Administered 2016-02-24: 14:00:00 via INTRAVENOUS
  Filled 2016-02-24: qty 1000

## 2016-02-24 MED ORDER — DIPHENHYDRAMINE HCL 25 MG PO CAPS
50.0000 mg | ORAL_CAPSULE | Freq: Once | ORAL | Status: AC
  Administered 2016-02-24: 50 mg via ORAL
  Filled 2016-02-24: qty 2

## 2016-02-24 MED ORDER — SODIUM CHLORIDE 0.9 % IV SOLN
6.0000 mg/kg | Freq: Once | INTRAVENOUS | Status: AC
  Administered 2016-02-24: 294 mg via INTRAVENOUS
  Filled 2016-02-24: qty 14

## 2016-02-24 MED ORDER — HEPARIN SOD (PORK) LOCK FLUSH 100 UNIT/ML IV SOLN
500.0000 [IU] | Freq: Once | INTRAVENOUS | Status: AC
  Administered 2016-02-24: 500 [IU] via INTRAVENOUS
  Filled 2016-02-24: qty 5

## 2016-02-24 MED ORDER — ACETAMINOPHEN 325 MG PO TABS
650.0000 mg | ORAL_TABLET | Freq: Once | ORAL | Status: AC
  Administered 2016-02-24: 650 mg via ORAL
  Filled 2016-02-24: qty 2

## 2016-02-24 MED ORDER — SODIUM CHLORIDE 0.9% FLUSH
10.0000 mL | INTRAVENOUS | Status: DC | PRN
  Administered 2016-02-24: 10 mL via INTRAVENOUS
  Filled 2016-02-24: qty 10

## 2016-02-27 ENCOUNTER — Encounter: Payer: Self-pay | Admitting: Hematology and Oncology

## 2016-03-01 ENCOUNTER — Encounter: Payer: Self-pay | Admitting: *Deleted

## 2016-03-01 ENCOUNTER — Other Ambulatory Visit: Payer: 59

## 2016-03-01 NOTE — Pre-Procedure Instructions (Signed)
Study Result    Result status: Final result     *[place], [place] [address]  [phone]  ------------------------------------------------------------------- Transthoracic Echocardiography  Patient: Emma Atkinson, Emma Atkinson MR #: [id number] Study Date: 02/23/2016 Gender: F Age: 50 Height: 154.9 cm Weight: 51.6 kg BSA: 1.49 m^2 Pt. Status: Room:  SONOGRAPHER [facility] ATTENDING Lequita Asal ORDERING Corcoran, Melissa C REFERRING Corcoran, Melissa C PERFORMING [facility]  cc:  ------------------------------------------------------------------- LV EF: 55% - 65%  ------------------------------------------------------------------- Indications: Chemo V67.2.  ------------------------------------------------------------------- History: PMH: Breast cancer, anxiety, depression, anemia, complications of anethesia.  ------------------------------------------------------------------- Study Conclusions  - Left ventricle: The cavity size was normal. Systolic function was  normal. The estimated ejection fraction was in the range of 55%  to 65%. Wall motion was normal; there were no regional wall  motion abnormalities. Left ventricular diastolic function  parameters were normal. - Left atrium: The atrium was normal in size. - Right ventricle: Systolic function was normal. - Pulmonary arteries: Systolic pressure was within the normal  range.  Impressions:  - Normal study.  Transthoracic echocardiography. M-mode, complete 2D, spectral Doppler, and color Doppler. Birthdate: Patient birthdate: 1966-09-02. Age: Patient is 50 yr old. Sex: Gender: female. BMI: 21.5 kg/m^2. Blood pressure: 146/72 Patient status: Outpatient. Study date: Study date:  02/23/2016. Study time: 10:48 AM.  -------------------------------------------------------------------  ------------------------------------------------------------------- Left ventricle: The cavity size was normal. Systolic function was normal. The estimated ejection fraction was in the range of 55% to 65%. Wall motion was normal; there were no regional wall motion abnormalities. The transmitral flow pattern was normal. The deceleration time of the early transmitral flow velocity was normal. The pulmonary vein flow pattern was normal. The tissue Doppler parameters were normal. Left ventricular diastolic function parameters were normal.  ------------------------------------------------------------------- Aortic valve: Poorly visualized. Trileaflet; normal thickness leaflets. Mobility was not restricted. Doppler: Transvalvular velocity was within the normal range. There was no stenosis. There was no regurgitation. Peak velocity ratio of LVOT to aortic valve: 0.78. Valve area (Vmax): 2.46 cm^2. Indexed valve area (Vmax): 1.65 cm^2/m^2. Peak gradient (S): 4 mm Hg.  ------------------------------------------------------------------- Aorta: Aortic root: The aortic root was normal in size.  ------------------------------------------------------------------- Mitral valve: Structurally normal valve. Mobility was not restricted. Doppler: Transvalvular velocity was within the normal range. There was no evidence for stenosis. There was no regurgitation. Peak gradient (D): 2 mm Hg.  ------------------------------------------------------------------- Left atrium: The atrium was normal in size.  ------------------------------------------------------------------- Right ventricle: The cavity size was normal. Wall thickness was normal. Systolic function was normal.  ------------------------------------------------------------------- Pulmonic valve: Doppler:  Transvalvular velocity was within the normal range. There was no evidence for stenosis.  ------------------------------------------------------------------- Tricuspid valve: Structurally normal valve. Doppler: Transvalvular velocity was within the normal range. There was trivial regurgitation.  ------------------------------------------------------------------- Pulmonary artery: The main pulmonary artery was normal-sized. Systolic pressure was within the normal range.  ------------------------------------------------------------------- Right atrium: The atrium was normal in size.  ------------------------------------------------------------------- Pericardium: There was no pericardial effusion.  ------------------------------------------------------------------- Systemic veins: Inferior vena cava: The vessel was normal in size.  ------------------------------------------------------------------- Measurements  Left ventricle Value Reference LV ID, ED, PLAX chordal (L) 39.3 mm 43 - 52 LV ID, ES, PLAX chordal 26 mm 23 - 38 LV fx shortening, PLAX chordal 34 % >=29 LV PW thickness, ED 7.64 mm --------- IVS/LV PW ratio, ED 0.97 <=1.3 LV e&', lateral 10.4 cm/s --------- LV E/e&', lateral 7.41 --------- LV e&', medial 5.66 cm/s --------- LV E/e&', medial 13.62 --------- LV e&', average 8.03 cm/s ---------  LV E/e&', average 9.6 ---------  Ventricular septum Value Reference IVS thickness, ED 7.38 mm  ---------  LVOT Value Reference LVOT ID, S 20 mm --------- LVOT area 3.14 cm^2 --------- LVOT peak velocity, S 80 cm/s ---------  Aortic valve Value Reference Aortic valve peak velocity, S 102 cm/s --------- Aortic peak gradient, S 4 mm Hg --------- Velocity ratio, peak, LVOT/AV 0.78 --------- Aortic valve area, peak velocity 2.46 cm^2 --------- Aortic valve area/bsa, peak 1.65 cm^2/m^2 --------- velocity  Aorta Value Reference Aortic root ID, ED 26 mm ---------  Left atrium Value Reference LA ID, A-P, ES 34 mm --------- LA ID/bsa, A-P (H) 2.28 cm/m^2 <=2.2 LA volume, S 35.7 ml --------- LA volume/bsa, S 23.9 ml/m^2 --------- LA volume, ES, 1-p A4C 29.5 ml --------- LA volume/bsa, ES, 1-p A4C 19.8 ml/m^2 --------- LA volume, ES, 1-p A2C 43.7 ml --------- LA volume/bsa, ES, 1-p A2C 29.3 ml/m^2 ---------  Mitral valve Value Reference Mitral E-wave peak velocity 77.1 cm/s --------- Mitral A-wave peak velocity 77.1 cm/s --------- Mitral deceleration time (H) 253 ms 150 - 230 Mitral peak gradient, D 2 mm Hg --------- Mitral E/A ratio, peak 1 ---------  Right ventricle  Value Reference RV ID, ED, PLAX 21.5 mm 19 - 38 TAPSE 13.9 mm ---------  Pulmonic valve Value Reference Pulmonic valve peak velocity, S 79.1 cm/s ---------  Legend: (L) and (H) mark values outside specified reference range.  ------------------------------------------------------------------- Prepared and Electronically Authenticated by  Esmond Plants, MD, [facility] 2017-05-11T13:48:27    PACS Images    Show images for ECHOCARDIOGRAM COMPLETE    Patient Information    Patient Name Sex DOB SSN   Emma Atkinson, Emma Atkinson Female 1966-03-15 [id number]    Reason For Exam  Priority: Routine   Chemo V67.2 / Z09   Dx: Malignant neoplasm of overlapping sites of right female breast ([place]) [id number] (ICD-10-CM)]   Comments: Can it be done on wed or Thursday of this week. Pt needs to stay on sch. For her herceptin and she is also sch. For surgery on 5/25 and we want her to stay on schedule with her herceptin.    Procedures Performed    Code Procedure Name   [id number] ECHO COMPLETE WO IMAGING ENHANCING AGENT    Performing Technologist/Nurse    Performing Technologist/Nurse:       Order-Level Documents:    There are no order-level documents.    Encounter-Level Documents - 02/23/2016:      Scan on 02/24/2016 12:28 PM by Provider Default, MDScan on 02/24/2016 12:28 PM by Provider Default, MD     Electronic signature on 02/23/2016 10:35 AM    Signed    Electronically signed by Minna Merritts, MD on 02/23/16 at 1348 EDT    Printable Result Report    Result Report    ECHOCARDIOGRAM COMPLETE (Order [id number])  Echocardiography  Order: [id number]   Released By: Dorena Cookey.  Authorizing: Lequita Asal, MD   Date: 02/23/2016  Department: [facility]       Procedure Abnormality Status     ECHOCARDIOGRAM COMPLETE      Order Information     Order Date/Time Release Date/Time Start Date/Time End Date/Time    02/23/16 10:36 AM 02/23/16 10:36 AM 02/23/16 10:36 AM 02/23/2016      Order Details     Frequency Duration Priority Order Class    As needed 1 occurrence Routine Ancillary Performed      Acc#    [id number]  Order Questions     Question Answer Comment    Where should this test be performed [facility]     Complete or Limited study? Complete     Does the patient have a known history of hypersensitivity to Perflutren? No - With Imaging Enhancing Agent     Note:  Perflutren is an Warehouse manager used to make the heart easier to see during echocardiographic procedures.    Expected Date: ASAP     Reason for exam-Echo Chemo V67.2 / Z09       Associated Diagnoses       ICD-9-CM ICD-10-CM    Malignant neoplasm of overlapping sites of right female breast [facility])    174.8 C50.811      Authorizing Provider [address]     Date/Time Authorizing Provider Changed by    02/23/2016 10:36 AM Lequita Asal, MD Luella Cook, RN      Order Requisition     ECHOCARDIOGRAM COMPLETE (Order [id number]) on 02/23/16     Collection Information     Collected: 02/23/2016 10:48 AM   Resulting Agency: [employer]     ECHOCARDIOGRAM COMPLETE (Order [id number]) on 02/23/16

## 2016-03-01 NOTE — Patient Instructions (Addendum)
  Your procedure is scheduled on: 03-08-16 Report to [place] ON RIGHT @ 7:45 PER PT   Remember: Instructions that are not followed completely may result in serious medical risk, up to and including death, or upon the discretion of your surgeon and anesthesiologist your surgery may need to be rescheduled.    _X___ 1. Do not eat food or drink liquids after midnight. No gum chewing or hard candies.     _X___ 2. No Alcohol for 24 hours before or after surgery.   ____ 3. Bring all medications with you on the day of surgery if instructed.    ____ 4. Notify your doctor if there is any change in your medical condition     (cold, fever, infections).     Do not wear jewelry, make-up, hairpins, clips or nail polish.  Do not wear lotions, powders, or perfumes. You may wear deodorant.  Do not shave 48 hours prior to surgery. Men may shave face and neck.  Do not bring valuables to the hospital.    [facility] is not responsible for any belongings or valuables.               Contacts, dentures or bridgework may not be worn into surgery.  Leave your suitcase in the car. After surgery it may be brought to your room.  For patients admitted to the hospital, discharge time is determined by your treatment team.   Patients discharged the day of surgery will not be allowed to drive home.   Please read over the following fact sheets that you were given:     _X___ Take these medicines the morning of surgery with A SIP OF WATER:    1. DEXILANT  2. TAKE AN EXTRA DEXILANT ON Wednesday NIGHT BEFORE BED  3. MAY TAKE ATIVAN IF NEEDED AM OF SURGERY   4.  5.  6.  ____ Fleet Enema (as directed)   ____ Use CHG Soap as directed  ____ Use inhalers on the day of surgery  ____ Stop metformin 2 days prior to surgery    ____ Take 1/2 of usual insulin dose the night before surgery and none on the morning of surgery.   ____ Stop Coumadin/Plavix/aspirin-N/A  ____ Stop  Anti-inflammatories-NO NSAIDS OR ASA PRODUCTS-TYLENOL OK TO CONTINUE   ____ Stop supplements until after surgery.    ____ Bring C-Pap to the hospital.

## 2016-03-07 ENCOUNTER — Other Ambulatory Visit: Payer: Self-pay

## 2016-03-07 NOTE — Patient Outreach (Signed)
[facility]) Care Management  03/07/2016  Emma Atkinson December 12, 1965 [id number]  Phone call for high cost referral assessment.  Member has hx of rt breast cancer and has been receiving chemotherapy.  She is scheduled for a bilateral mastectomy 03/08/16.   Member states she is anxious about her surgery tomorrow but she is otherwise doing good.  States she has been on leave while she was getting chemotherapy and she plans to go back to work when she is healed from her surgery.   Denies any case management needs at this time. Instructed on [employer] and how to contact in the future if needed. Member assessed with no further intervention needed. Peter Garter RN, [facility] Care Management Coordinator-Link to [place] Management [phone]

## 2016-03-08 ENCOUNTER — Ambulatory Visit (HOSPITAL_COMMUNITY): Payer: 59

## 2016-03-08 ENCOUNTER — Inpatient Hospital Stay: Payer: 59

## 2016-03-08 ENCOUNTER — Observation Stay
Admission: RE | Admit: 2016-03-08 | Discharge: 2016-03-09 | Disposition: A | Discharge status: Home / Self Care | Payer: 59 | Source: Ambulatory Visit | Attending: Surgery | Admitting: Surgery

## 2016-03-08 ENCOUNTER — Encounter: Payer: Self-pay | Admitting: Anesthesiology

## 2016-03-08 ENCOUNTER — Inpatient Hospital Stay: Payer: 59 | Admitting: Anesthesiology

## 2016-03-08 ENCOUNTER — Encounter
Admission: RE | Disposition: A | Discharge status: Home / Self Care | Payer: Self-pay | Source: Ambulatory Visit | Attending: Surgery

## 2016-03-08 ENCOUNTER — Encounter
Admission: RE | Admit: 2016-03-08 | Discharge: 2016-03-08 | Disposition: A | Discharge status: Home / Self Care | Payer: 59 | Source: Ambulatory Visit | Attending: Surgery | Admitting: Surgery

## 2016-03-08 ENCOUNTER — Encounter: Payer: Self-pay | Admitting: *Deleted

## 2016-03-08 DIAGNOSIS — C773 Secondary and unspecified malignant neoplasm of axilla and upper limb lymph nodes: Secondary | ICD-10-CM | POA: Diagnosis not present

## 2016-03-08 DIAGNOSIS — Z419 Encounter for procedure for purposes other than remedying health state, unspecified: Secondary | ICD-10-CM

## 2016-03-08 DIAGNOSIS — F329 Major depressive disorder, single episode, unspecified: Secondary | ICD-10-CM | POA: Insufficient documentation

## 2016-03-08 DIAGNOSIS — C50911 Malignant neoplasm of unspecified site of right female breast: Secondary | ICD-10-CM | POA: Diagnosis not present

## 2016-03-08 DIAGNOSIS — Z79899 Other long term (current) drug therapy: Secondary | ICD-10-CM | POA: Diagnosis not present

## 2016-03-08 DIAGNOSIS — D242 Benign neoplasm of left breast: Secondary | ICD-10-CM | POA: Diagnosis not present

## 2016-03-08 DIAGNOSIS — D649 Anemia, unspecified: Secondary | ICD-10-CM | POA: Diagnosis not present

## 2016-03-08 DIAGNOSIS — R928 Other abnormal and inconclusive findings on diagnostic imaging of breast: Secondary | ICD-10-CM | POA: Diagnosis not present

## 2016-03-08 DIAGNOSIS — K219 Gastro-esophageal reflux disease without esophagitis: Secondary | ICD-10-CM | POA: Insufficient documentation

## 2016-03-08 DIAGNOSIS — Z8249 Family history of ischemic heart disease and other diseases of the circulatory system: Secondary | ICD-10-CM | POA: Diagnosis not present

## 2016-03-08 DIAGNOSIS — Z9221 Personal history of antineoplastic chemotherapy: Secondary | ICD-10-CM | POA: Diagnosis not present

## 2016-03-08 DIAGNOSIS — J439 Emphysema, unspecified: Secondary | ICD-10-CM | POA: Diagnosis not present

## 2016-03-08 DIAGNOSIS — Z808 Family history of malignant neoplasm of other organs or systems: Secondary | ICD-10-CM | POA: Diagnosis not present

## 2016-03-08 DIAGNOSIS — Z888 Allergy status to other drugs, medicaments and biological substances status: Secondary | ICD-10-CM | POA: Diagnosis not present

## 2016-03-08 DIAGNOSIS — C50912 Malignant neoplasm of unspecified site of left female breast: Secondary | ICD-10-CM | POA: Diagnosis not present

## 2016-03-08 DIAGNOSIS — R0602 Shortness of breath: Secondary | ICD-10-CM | POA: Insufficient documentation

## 2016-03-08 DIAGNOSIS — Z171 Estrogen receptor negative status [ER-]: Secondary | ICD-10-CM | POA: Diagnosis not present

## 2016-03-08 DIAGNOSIS — Z853 Personal history of malignant neoplasm of breast: Secondary | ICD-10-CM | POA: Diagnosis not present

## 2016-03-08 DIAGNOSIS — N63 Unspecified lump in breast: Secondary | ICD-10-CM | POA: Diagnosis not present

## 2016-03-08 DIAGNOSIS — Z803 Family history of malignant neoplasm of breast: Secondary | ICD-10-CM | POA: Insufficient documentation

## 2016-03-08 DIAGNOSIS — C50919 Malignant neoplasm of unspecified site of unspecified female breast: Secondary | ICD-10-CM | POA: Diagnosis present

## 2016-03-08 DIAGNOSIS — Z8042 Family history of malignant neoplasm of prostate: Secondary | ICD-10-CM | POA: Diagnosis not present

## 2016-03-08 DIAGNOSIS — F419 Anxiety disorder, unspecified: Secondary | ICD-10-CM | POA: Diagnosis not present

## 2016-03-08 DIAGNOSIS — Z87891 Personal history of nicotine dependence: Secondary | ICD-10-CM | POA: Insufficient documentation

## 2016-03-08 DIAGNOSIS — C50411 Malignant neoplasm of upper-outer quadrant of right female breast: Secondary | ICD-10-CM | POA: Diagnosis not present

## 2016-03-08 HISTORY — DX: Reserved for inherently not codable concepts without codable children: IMO0001

## 2016-03-08 HISTORY — PX: SENTINEL NODE BIOPSY: SHX6608

## 2016-03-08 HISTORY — DX: Gastro-esophageal reflux disease without esophagitis: K21.9

## 2016-03-08 HISTORY — PX: SIMPLE MASTECTOMY WITH AXILLARY SENTINEL NODE BIOPSY: SHX6098

## 2016-03-08 HISTORY — DX: Chronic kidney disease, unspecified: N18.9

## 2016-03-08 SURGERY — SIMPLE MASTECTOMY
Anesthesia: General | Laterality: Bilateral | Wound class: Clean

## 2016-03-08 MED ORDER — FENTANYL CITRATE (PF) 100 MCG/2ML IJ SOLN
INTRAMUSCULAR | Status: AC
Start: 2016-03-08 — End: 2016-03-08
  Administered 2016-03-08: 25 ug via INTRAVENOUS
  Filled 2016-03-08: qty 2

## 2016-03-08 MED ORDER — MORPHINE SULFATE (PF) 2 MG/ML IV SOLN: 1.0000 mg | INTRAVENOUS | Status: DC | PRN

## 2016-03-08 MED ORDER — PANTOPRAZOLE SODIUM 40 MG PO TBEC
40.0000 mg | DELAYED_RELEASE_TABLET | Freq: Every day | ORAL | Status: DC
  Administered 2016-03-09: 40 mg via ORAL
  Filled 2016-03-08: qty 1

## 2016-03-08 MED ORDER — TECHNETIUM TC 99M SULFUR COLLOID
1.0000 | Freq: Once | INTRAVENOUS | Status: AC | PRN
  Administered 2016-03-08: 1.01 via INTRAVENOUS

## 2016-03-08 MED ORDER — ONDANSETRON HCL 4 MG/2ML IJ SOLN: 4.0000 mg | Freq: Once | INTRAMUSCULAR | Status: DC | PRN

## 2016-03-08 MED ORDER — ADULT MULTIVITAMIN W/MINERALS CH
1.0000 | ORAL_TABLET | Freq: Every day | ORAL | Status: DC
  Administered 2016-03-09: 1 via ORAL
  Filled 2016-03-08: qty 1

## 2016-03-08 MED ORDER — DIPHENHYDRAMINE HCL (SLEEP) 25 MG PO TABS: 25.0000 mg | ORAL_TABLET | Freq: Every day | ORAL | Status: DC

## 2016-03-08 MED ORDER — PHENYLEPHRINE HCL 10 MG/ML IJ SOLN
INTRAMUSCULAR | Status: DC | PRN
  Administered 2016-03-08 (×5): 100 ug via INTRAVENOUS

## 2016-03-08 MED ORDER — ACETAMINOPHEN 325 MG PO TABS
650.0000 mg | ORAL_TABLET | Freq: Four times a day (QID) | ORAL | Status: DC | PRN
  Administered 2016-03-08: 650 mg via ORAL
  Filled 2016-03-08: qty 2

## 2016-03-08 MED ORDER — TECHNETIUM TC 99M SULFUR COLLOID
1.0000 | Freq: Once | INTRAVENOUS | Status: AC | PRN
  Administered 2016-03-08: 1.025 via INTRAVENOUS

## 2016-03-08 MED ORDER — MULTIVITAMINS PO CAPS: 1.0000 | ORAL_CAPSULE | Freq: Every day | ORAL | Status: DC

## 2016-03-08 MED ORDER — HYDROCODONE-ACETAMINOPHEN 5-325 MG PO TABS
1.0000 | ORAL_TABLET | ORAL | Status: DC | PRN
  Administered 2016-03-08: 2 via ORAL
  Administered 2016-03-09 (×2): 1 via ORAL
  Filled 2016-03-08: qty 2
  Filled 2016-03-08: qty 1
  Filled 2016-03-08: qty 2

## 2016-03-08 MED ORDER — ACETAMINOPHEN 650 MG RE SUPP: 650.0000 mg | Freq: Four times a day (QID) | RECTAL | Status: DC | PRN

## 2016-03-08 MED ORDER — GLYCOPYRROLATE 0.2 MG/ML IJ SOLN
INTRAMUSCULAR | Status: DC | PRN
  Administered 2016-03-08: 0.2 mg via INTRAVENOUS

## 2016-03-08 MED ORDER — MIDAZOLAM HCL 2 MG/2ML IJ SOLN
INTRAMUSCULAR | Status: DC | PRN
  Administered 2016-03-08: 1 mg via INTRAVENOUS
  Administered 2016-03-08: 2 mg via INTRAVENOUS
  Administered 2016-03-08 (×2): 1 mg via INTRAVENOUS

## 2016-03-08 MED ORDER — DIPHENHYDRAMINE HCL 25 MG PO CAPS
25.0000 mg | ORAL_CAPSULE | Freq: Every day | ORAL | Status: DC
  Administered 2016-03-08: 25 mg via ORAL
  Filled 2016-03-08: qty 1

## 2016-03-08 MED ORDER — FENTANYL CITRATE (PF) 100 MCG/2ML IJ SOLN
25.0000 ug | INTRAMUSCULAR | Status: DC | PRN
  Administered 2016-03-08 (×4): 25 ug via INTRAVENOUS

## 2016-03-08 MED ORDER — ONDANSETRON HCL 4 MG/2ML IJ SOLN
INTRAMUSCULAR | Status: DC | PRN
  Administered 2016-03-08: 4 mg via INTRAVENOUS

## 2016-03-08 MED ORDER — ALPRAZOLAM 0.25 MG PO TABS: 0.2500 mg | ORAL_TABLET | Freq: Three times a day (TID) | ORAL | Status: DC | PRN

## 2016-03-08 MED ORDER — LORAZEPAM 0.5 MG PO TABS: 0.5000 mg | ORAL_TABLET | Freq: Four times a day (QID) | ORAL | Status: DC | PRN

## 2016-03-08 MED ORDER — FENTANYL CITRATE (PF) 100 MCG/2ML IJ SOLN
INTRAMUSCULAR | Status: DC | PRN
  Administered 2016-03-08 (×6): 50 ug via INTRAVENOUS

## 2016-03-08 MED ORDER — PROPOFOL 10 MG/ML IV BOLUS
INTRAVENOUS | Status: DC | PRN
  Administered 2016-03-08: 100 mg via INTRAVENOUS
  Administered 2016-03-08: 50 mg via INTRAVENOUS

## 2016-03-08 MED ORDER — TECHNETIUM TC 99M SULFUR COLLOID: 1.0000 | Freq: Once | INTRAVENOUS | Status: DC | PRN

## 2016-03-08 MED ORDER — ESMOLOL HCL 100 MG/10ML IV SOLN
INTRAVENOUS | Status: DC | PRN
  Administered 2016-03-08: 10 mg via INTRAVENOUS
  Administered 2016-03-08: 20 mg via INTRAVENOUS
  Administered 2016-03-08 (×2): 10 mg via INTRAVENOUS

## 2016-03-08 MED ORDER — LACTATED RINGERS IV SOLN
INTRAVENOUS | Status: DC
  Administered 2016-03-08: 09:00:00 via INTRAVENOUS

## 2016-03-08 SURGICAL SUPPLY — 41 items
BULB RESERV EVAC DRAIN JP 100C (MISCELLANEOUS) ×6 IMPLANT
CANISTER SUCT 1200ML W/VALVE (MISCELLANEOUS) ×3 IMPLANT
CHLORAPREP W/TINT 26ML (MISCELLANEOUS) ×3 IMPLANT
CNTNR SPEC 2.5X3XGRAD LEK (MISCELLANEOUS) ×2
CONT SPEC 4OZ STER OR WHT (MISCELLANEOUS) ×4
CONTAINER SPEC 2.5X3XGRAD LEK (MISCELLANEOUS) ×2 IMPLANT
DRAIN CHANNEL JP 15F RND 16 (MISCELLANEOUS) ×18 IMPLANT
DRAPE LAPAROTOMY TRNSV 106X77 (MISCELLANEOUS) ×3 IMPLANT
ELECT REM PT RETURN 9FT ADLT (ELECTROSURGICAL) ×3
ELECTRODE REM PT RTRN 9FT ADLT (ELECTROSURGICAL) ×1 IMPLANT
GAUZE SPONGE 4X4 12PLY STRL (GAUZE/BANDAGES/DRESSINGS) ×3 IMPLANT
GLOVE BIO SURGEON STRL SZ7.5 (GLOVE) ×3 IMPLANT
GOWN STRL REUS W/ TWL LRG LVL3 (GOWN DISPOSABLE) ×3 IMPLANT
GOWN STRL REUS W/TWL LRG LVL3 (GOWN DISPOSABLE) ×6
KIT RM TURNOVER STRD PROC AR (KITS) ×3 IMPLANT
LABEL OR SOLS (LABEL) ×3 IMPLANT
LAPSAC SURG PACK 8X10 (MISCELLANEOUS) ×3
LIQUID BAND (GAUZE/BANDAGES/DRESSINGS) ×6 IMPLANT
NDL SAFETY 18GX1.5 (NEEDLE) ×3 IMPLANT
NDL SAFETY 22GX1.5 (NEEDLE) ×3 IMPLANT
PACK BASIN MINOR ARMC (MISCELLANEOUS) ×3 IMPLANT
PACK SURG LAPSAC 8X10 (MISCELLANEOUS) ×1 IMPLANT
SLEVE PROBE SENORX GAMMA FIND (MISCELLANEOUS) ×3 IMPLANT
SPONGE LAP 18X18 5 PK (GAUZE/BANDAGES/DRESSINGS) ×3 IMPLANT
SUT CHROMIC 3 0 SH 27 (SUTURE) ×9 IMPLANT
SUT CHROMIC 4 0 RB 1X27 (SUTURE) IMPLANT
SUT ETHILON 3-0 FS-10 30 BLK (SUTURE) ×3
SUT ETHILON 4-0 (SUTURE)
SUT ETHILON 4-0 FS2 18XMFL BLK (SUTURE)
SUT MNCRL 4-0 (SUTURE) ×4
SUT MNCRL 4-0 27XMFL (SUTURE) ×2
SUT SILK 3 0 SH 30 (SUTURE) ×3 IMPLANT
SUT SILK 3-0 (SUTURE) ×4
SUT SILK 3-0 SH-1 18XCR BRD (SUTURE) ×2
SUT VICRYL+ 3-0 144IN (SUTURE) ×3 IMPLANT
SUTURE EHLN 3-0 FS-10 30 BLK (SUTURE) ×1 IMPLANT
SUTURE ETHLN 4-0 FS2 18XMF BLK (SUTURE) IMPLANT
SUTURE MNCRL 4-0 27XMF (SUTURE) ×2 IMPLANT
SUTURE SILK 3-0 SH-1 18XCR BRD (SUTURE) ×2 IMPLANT
SYRINGE 10CC LL (SYRINGE) ×3 IMPLANT
WATER STERILE IRR 1000ML POUR (IV SOLUTION) ×3 IMPLANT

## 2016-03-08 NOTE — Brief Op Note (Signed)
03/08/2016  4:00 PM  PATIENT:  Emma Atkinson  50 y.o. female  PRE-OPERATIVE DIAGNOSIS:  BREAST RIGHT BREAST,LUMP OR MASS  POST-OPERATIVE DIAGNOSIS:  BREAST RIGHT BREAST,LUMP OR MASS  PROCEDURE: Right modified radical mastectomy, left simple mastectomy with sentinel lymph node biopsy SURGEON:  Surgeon(s) and Role:    * Leonie Green, MD - Primary  ANESTHESIA:  Gen.  EBL: 100 cc  BLOOD ADMINISTERED: None  DRAINS: Blake drains  SPECIMEN: Left and right breast   PLAN OF CARE: Observation bed

## 2016-03-08 NOTE — Progress Notes (Signed)
She reports minimal discomfort at the operative sites. She is reading satisfactorily. She is receiving some supplemental oxygen. Her lung sounds are clear. The wound and drain on the right side appeared typical with some serosanguineous drainage in the suction containers.  On the left side there was evidence of an air leak. I applied an additional Tegaderm dressing which appeared to slow the leak. There was minimal serosanguineous drainage.  I discussed the operative findings and plan of care. Anticipate overnight observation

## 2016-03-08 NOTE — Anesthesia Postprocedure Evaluation (Signed)
Anesthesia Post Note  Patient: Emma Atkinson  Procedure(s) Performed: Procedure(s) (LRB): SIMPLE MASTECTOMY (Bilateral) SENTINEL NODE BIOPSY (Bilateral)  Patient location during evaluation: PACU Anesthesia Type: General Level of consciousness: awake and alert Pain management: pain level controlled Vital Signs Assessment: post-procedure vital signs reviewed and stable Respiratory status: spontaneous breathing, nonlabored ventilation, respiratory function stable and patient connected to nasal cannula oxygen Cardiovascular status: blood pressure returned to baseline and stable Postop Assessment: no signs of nausea or vomiting Anesthetic complications: no    Last Vitals:  Filed Vitals:   03/08/16 1650 03/08/16 1720  BP: 129/67 137/66  Pulse: 100 95  Temp:  36.7 C  Resp: 16 20    Last Pain:  Filed Vitals:   03/08/16 1720  PainSc: [place]

## 2016-03-08 NOTE — Anesthesia Procedure Notes (Signed)
Procedure Name: LMA Insertion Date/Time: 03/08/2016 9:38 AM Performed by: Jennette Bill Pre-anesthesia Checklist: Patient identified, Emergency Drugs available, Suction available, Patient being monitored and Timeout performed Patient Re-evaluated:Patient Re-evaluated prior to inductionOxygen Delivery Method: Circle system utilized Preoxygenation: Pre-oxygenation with 100% oxygen Intubation Type: IV induction Ventilation: Mask ventilation without difficulty LMA: LMA inserted LMA Size: 3.0 Number of attempts: 1 Placement Confirmation: positive ETCO2 and breath sounds checked- equal and bilateral Tube secured with: Tape Dental Injury: Teeth and Oropharynx as per pre-operative assessment

## 2016-03-08 NOTE — H&P (Signed)
  She has a history of locally advanced carcinoma the right breast and neoadjuvant chemotherapy. She comes in today for bilateral mastectomy and sentinel lymph node biopsy possible axillary dissection. She has had injection of radioactive technetium sulfur colloid.  She reports no change in overall condition since the day of the office visit.  I discussed the plan for surgery. Both sides were marked YES

## 2016-03-08 NOTE — Anesthesia Preprocedure Evaluation (Addendum)
Anesthesia Evaluation  Patient identified by MRN, date of birth, ID band Patient awake    Reviewed: Allergy & Precautions, NPO status , Patient's Chart, lab work & pertinent test results, reviewed documented beta blocker date and time   History of Anesthesia Complications (+) history of anesthetic complications  Airway Mallampati: II  TM Distance: >3 FB     Dental  (+) Chipped   Pulmonary shortness of breath, former smoker,           Cardiovascular      Neuro/Psych PSYCHIATRIC DISORDERS Anxiety Depression  Neuromuscular disease    GI/Hepatic GERD  Controlled,  Endo/Other    Renal/GU Renal InsufficiencyRenal disease     Musculoskeletal   Abdominal   Peds  Hematology  (+) anemia ,   Anesthesia Other Findings Echo OK. Anemic Hb 11.8.  Reproductive/Obstetrics                            Anesthesia Physical Anesthesia Plan  ASA: II  Anesthesia Plan: General   Post-op Pain Management:    Induction: Intravenous  Airway Management Planned: LMA  Additional Equipment:   Intra-op Plan:   Post-operative Plan:   Informed Consent: I have reviewed the patients History and Physical, chart, labs and discussed the procedure including the risks, benefits and alternatives for the proposed anesthesia with the patient or authorized representative who has indicated his/her understanding and acceptance.     Plan Discussed with: CRNA  Anesthesia Plan Comments:         Anesthesia Quick Evaluation

## 2016-03-08 NOTE — Op Note (Signed)
OPERATIVE REPORT  PREOPERATIVE  DIAGNOSIS: . Carcinoma of the right breast  POSTOPERATIVE DIAGNOSIS: . Carcinoma of the right breast  PROCEDURE: . Right modified radical mastectomy, left simple mastectomy with axillary sentinel lymph node biopsy.  ANESTHESIA:  General  SURGEON: Rochel Brome  MD  ASST. Arvilla Meres  RN   INDICATIONS: . She has a history of locally advanced multifocal carcinoma of the right breast. She also had biopsy of a right axillary lymph node demonstrating metastasis. She has had neoadjuvant chemotherapy with marked reduction of size of the tumor. The patient requested bilateral mastectomy. She did have a known fibroadenoma of the medial aspect of the left breast and other mammogram abnormalities being considered for biopsy. She did have preoperative bilateral injection of radioactive technetium sulfur colloid preop.  The patient was placed on the operating table in the supine position under general anesthesia. Each arm was placed on a lateral arm support. Each breast and surrounding chest wall and upper arms were prepared with ChloraPrep draped in a sterile manner.  On the right side a curvilinear incision was made from inferior medial to superior lateral above and below the areola. Electrocautery was used for hemostasis. Silk sutures were placed at the skin edges for traction. Skin and subcutaneous flaps were raised in the direction of the clavicle, towards the sternum, down to the inferior mammary fold, laterally to the latissimus dorsi muscle. Further dissection was carried up into the axilla. The gamma counter was used to probe the axilla and determined there was a wide area of radioactivity but no focal site of radioactivity indicating a sentinel lymph node. There was no grossly palpable mass in the axilla. Seeing no focal sentinel lymph node activity the decision was made to include an axillary dissection with the breast. The breast was elevated off the underlying  fascia using electrocautery for hemostasis dissecting from medial to lateral. Dissection was carried out up  to the axillary vein and the axillary contents were dissected free from surrounding tissues. The intercostal brachial nerve was left intact. Numerous clamped vessels were ligated with 3-0 chromic. The lateral end of the skin ellipse was tagged with a stitch for the pathologist orientation. The specimen was submitted fresh for routine pathology. The wound was inspected and several small bleeding points were cauterized. Hemostasis was subsequently intact. The wound was irrigated with warm saline solution. Two15 French Blake drains were inserted through separate inferior stab wounds. The one drain extended up into the axilla and the other along the anterior chest wall. The drains were secured to the skin with 3-0 nylon suture the wound was closed with running 4-0 Monocryl subcutaneous suture and LiquiBand. LiquiBand was also placed around the drain sites. Suction bulbs were attached to the drains and activated and had some minimal serosanguineous drainage.  The patient appear to be in satisfactory condition and attention was turned to begin the left mastectomy. A curvilinear incision was made from inferior medial to superior lateral above and below the areola and carried down into the subcutaneous tissues. Electrocautery was used for hemostasis. Similar skin and subcutaneous flaps were raised. Further dissection was carried out into the axilla which was probed with a gamma counter demonstrating the location of a sentinel lymph node. This lymph node was dissected free from surrounding tissues and did include a small amount of fatty tissue with the lymph node. The lymph node was palpated and was about 6 mm in dimension. The ex vivo count was in the range of 275 to 335  counts per second. The background count was less than 20 counts per second. Sentinel lymph node was submitted for frozen section. Subsequently the  breast was elevated off the underlying deep fascia using electrocautery for hemostasis dissecting from medial to lateral. The pathologist did call to report the sentinel lymph node was negative for metastasis. The completion of the simple mastectomy was carried out with electrocautery there was no other remaining palpable mass within the axilla. Several clamped vessels were ligated with 3-0 chromic. The specimen was tagged with a stitch at the lateral end of the skin ellipse for the pathologist orientation and was submitted fresh for pathology. The wound was inspected and several small bleeding points were cauterized hemostasis was subsequently intact. The wound was irrigated with warm saline solution and aspirated. Two:15 [place] Blake drains were placed through separate inferior stab wounds 1 was extended up into the axilla and the other along the anterior chest wall. The drains were secured to the skin with 3-0 nylon suture. The wound was closed with running 4-0 Monocryl subcuticular suture. The wound and the drain sites were treated with LiquiBand. As the drains were attached to suction bulbs it was apparent that there was a leak. At the same time the anesthesiologist noted that breast sounds were decreased on the left side. There was concerns about possible pneumothorax. A portable chest x-ray was done. I reviewed the x-ray and saw no pneumothorax. We also had the radiologist's inspect the chest x-ray and reported no pneumothorax. There was minimal serosanguineous drainage. Tegaderm was placed over the drain site to help secure the leak also 2 inch silk tape was applied. The drains were connected to closed suction drainage.  The patient appeared to be in satisfactory condition and was prepared for transfer to the recovery room  [place].D.

## 2016-03-08 NOTE — Transfer of Care (Signed)
Immediate Anesthesia Transfer of Care Note  Patient: Emma Atkinson  Procedure(s) Performed: Procedure(s): SIMPLE MASTECTOMY (Bilateral) SENTINEL NODE BIOPSY (Bilateral)  Patient Location: PACU  Anesthesia Type:General  Level of Consciousness: awake, alert  and oriented  Airway & Oxygen Therapy: Patient Spontanous Breathing and Patient connected to face mask oxygen  Post-op Assessment: Report given to RN and Post -op Vital signs reviewed and stable  Post vital signs: Reviewed and stable  Last Vitals:  Filed Vitals:   03/08/16 0838  BP: 144/74  Pulse: 93  Temp: 36.7 C  Resp: 16    Last Pain: There were no vitals filed for this visit.       Complications: No apparent anesthesia complications

## 2016-03-09 ENCOUNTER — Encounter: Payer: Self-pay | Admitting: Surgery

## 2016-03-09 DIAGNOSIS — C773 Secondary and unspecified malignant neoplasm of axilla and upper limb lymph nodes: Secondary | ICD-10-CM | POA: Diagnosis not present

## 2016-03-09 DIAGNOSIS — K219 Gastro-esophageal reflux disease without esophagitis: Secondary | ICD-10-CM | POA: Diagnosis not present

## 2016-03-09 DIAGNOSIS — R928 Other abnormal and inconclusive findings on diagnostic imaging of breast: Secondary | ICD-10-CM | POA: Diagnosis not present

## 2016-03-09 DIAGNOSIS — R0602 Shortness of breath: Secondary | ICD-10-CM | POA: Diagnosis not present

## 2016-03-09 DIAGNOSIS — Z9221 Personal history of antineoplastic chemotherapy: Secondary | ICD-10-CM | POA: Diagnosis not present

## 2016-03-09 DIAGNOSIS — D649 Anemia, unspecified: Secondary | ICD-10-CM | POA: Diagnosis not present

## 2016-03-09 DIAGNOSIS — Z808 Family history of malignant neoplasm of other organs or systems: Secondary | ICD-10-CM | POA: Diagnosis not present

## 2016-03-09 DIAGNOSIS — Z171 Estrogen receptor negative status [ER-]: Secondary | ICD-10-CM | POA: Diagnosis not present

## 2016-03-09 DIAGNOSIS — C50911 Malignant neoplasm of unspecified site of right female breast: Secondary | ICD-10-CM | POA: Diagnosis not present

## 2016-03-09 MED ORDER — HYDROCODONE-ACETAMINOPHEN 5-325 MG PO TABS: 1.0000 | ORAL_TABLET | ORAL | Status: DC | PRN

## 2016-03-09 NOTE — Discharge Instructions (Signed)
Take Tylenol or Norco if needed for pain.  Empty each drain 1 or 2 times per day and record the daily amount of drainage from each drain.  Call the office next week to report the amount of drainage and schedule a postoperative visit.  Keep dressings dry.

## 2016-03-09 NOTE — Progress Notes (Signed)
Pt to be discharged per MD order. IV removed. Instructions reviewed with pt and family. JP drain education provided and pt verbalizes understanding.

## 2016-03-13 ENCOUNTER — Ambulatory Visit: Payer: 59 | Admitting: Pulmonary Disease

## 2016-03-15 LAB — SURGICAL PATHOLOGY

## 2016-03-17 IMAGING — CT CT ANGIO CHEST
2 of 6 series · 18 of 46 positions shown · IV contrast (APPLIED)
Comparison: 08/26/2015, 09/27/2015

CLINICAL DATA: Shortness of breath with exertion, history of right
breast cancer, receiving chemotherapy

EXAM:
CT ANGIOGRAPHY CHEST WITH CONTRAST
TECHNIQUE: Multidetector CT imaging of the chest was performed using the
standard protocol during bolus administration of intravenous
contrast. Multiplanar CT image reconstructions and MIPs were
obtained to evaluate the vascular anatomy.
CONTRAST:  75mL OMNIPAQUE IOHEXOL 350 MG/ML SOLN

[Series 6: pe thins 1.5 · axial · 0.68mm/px · z∈[-269,-33]mm · 15 of 220 slices shown]
[im 12/220  lung]
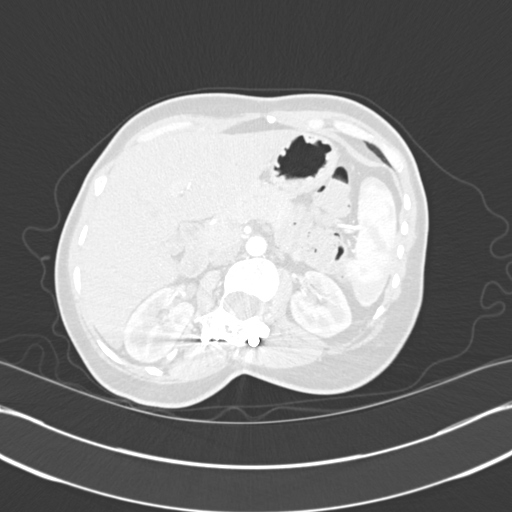
[im 24/220  soft-tissue]
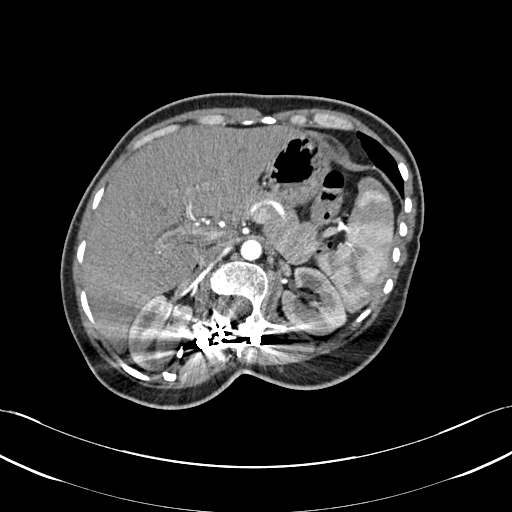
[im 47/220  lung]
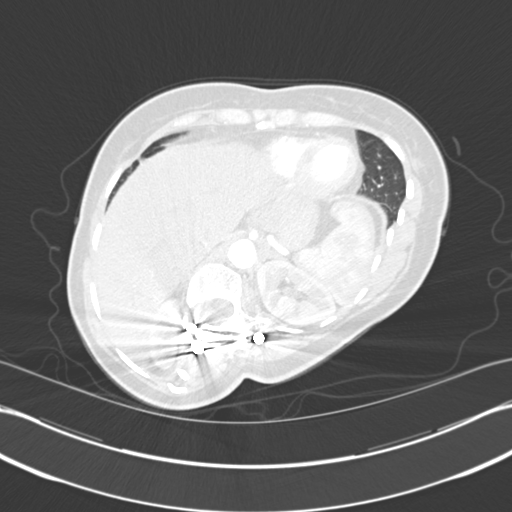
[im 58/220  soft-tissue]
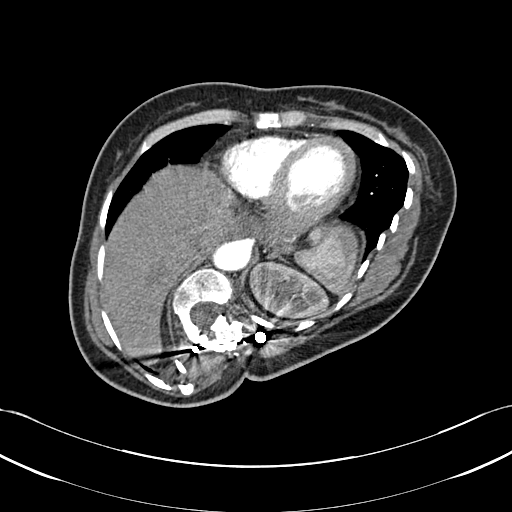
[im 70/220  lung]
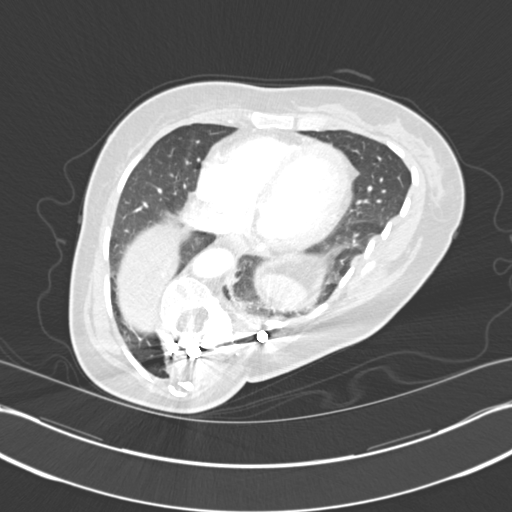
[im 81/220  soft-tissue]
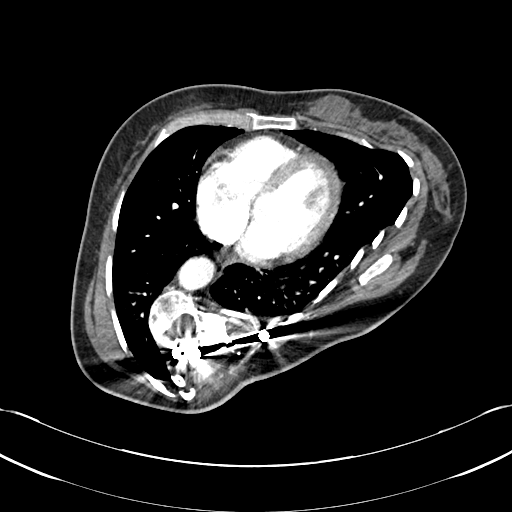
[im 93/220  lung]
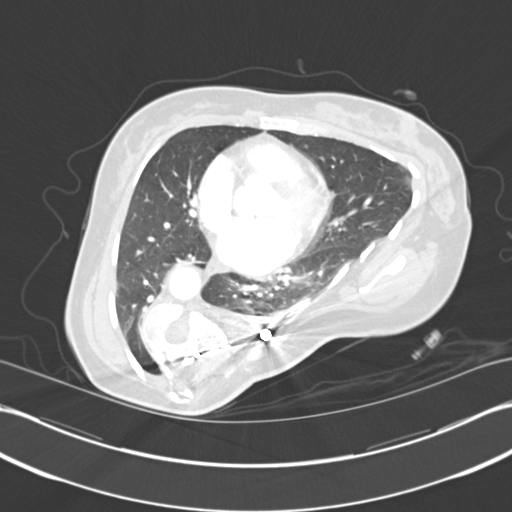
[im 116/220  soft-tissue]
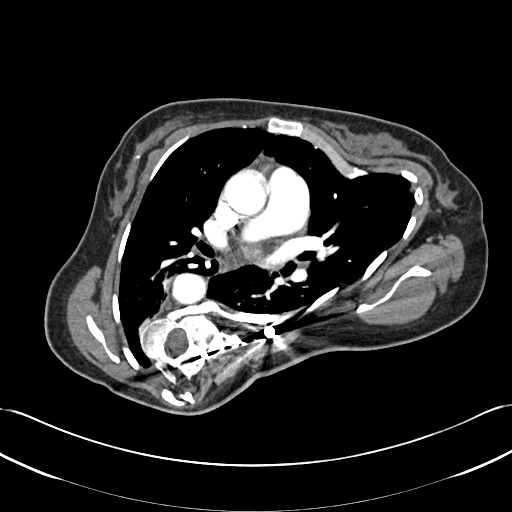
[im 127/220  lung]
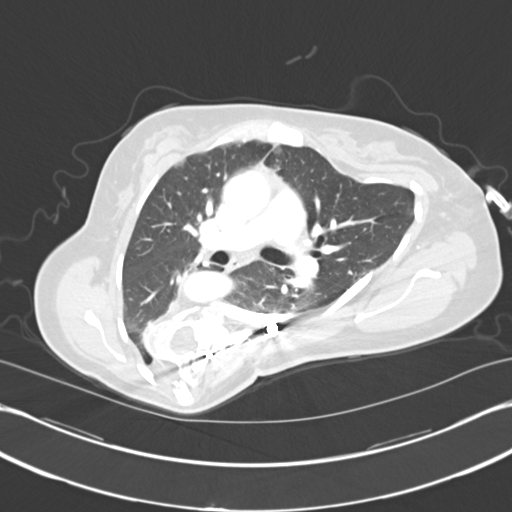
[im 139/220  soft-tissue]
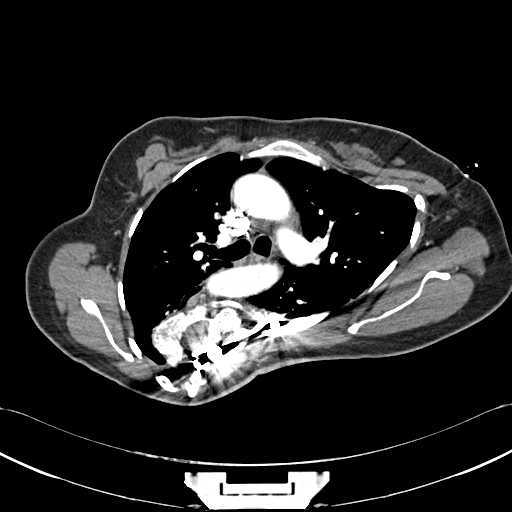
[im 150/220  lung]
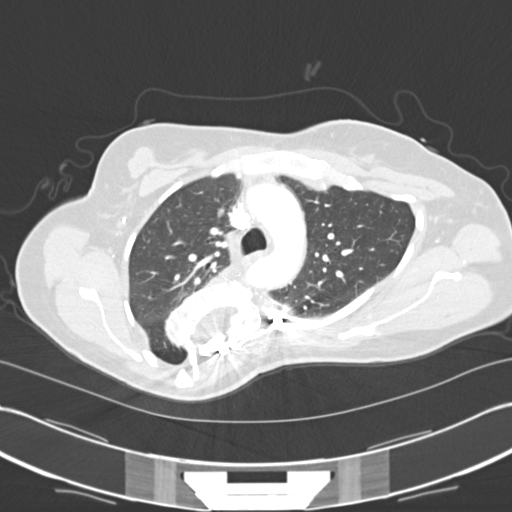
[im 162/220  soft-tissue]
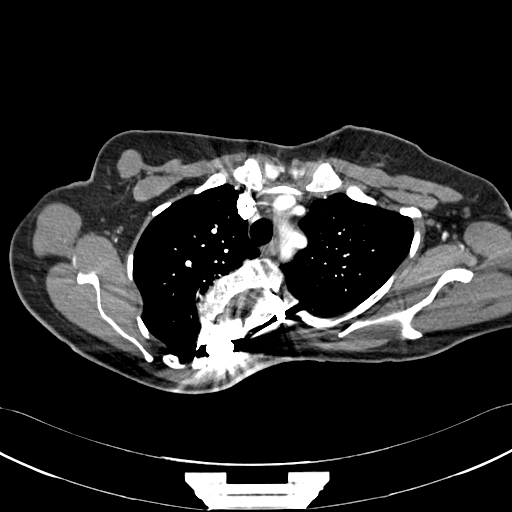
[im 185/220  lung]
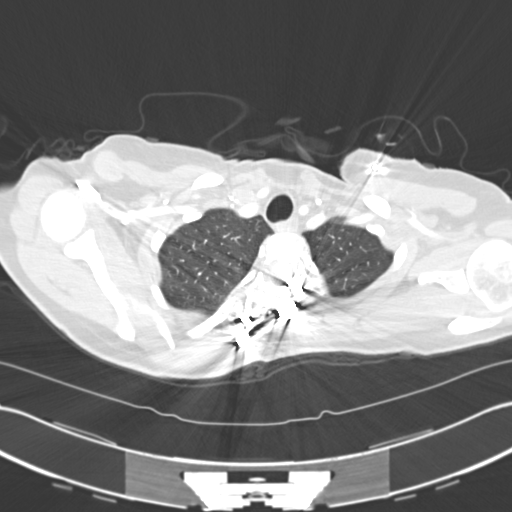
[im 196/220  soft-tissue]
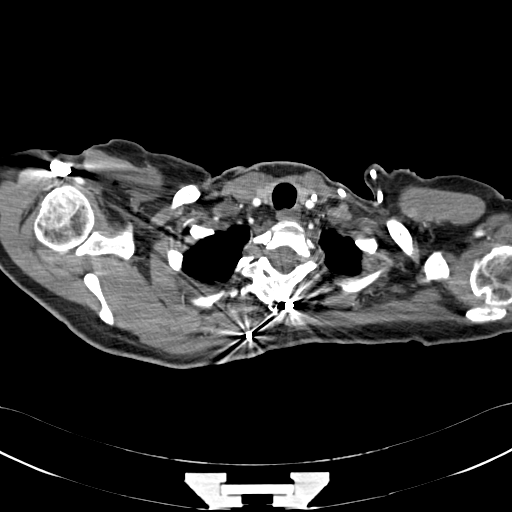
[im 208/220  lung]
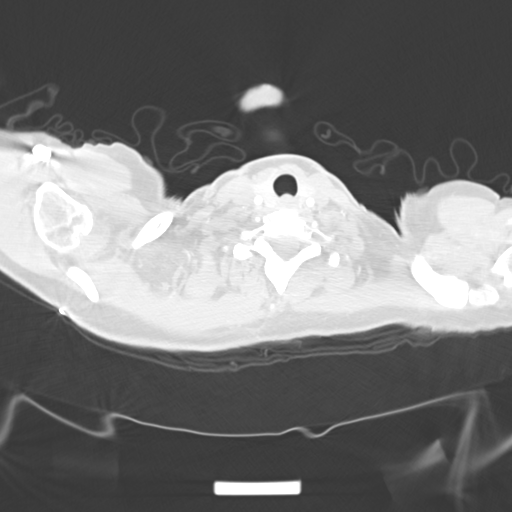

[Series 8: cor mpr 2.0 · coronal · 0.53mm/px · 3 of 112 slices shown]
[im 28/112  soft-tissue]
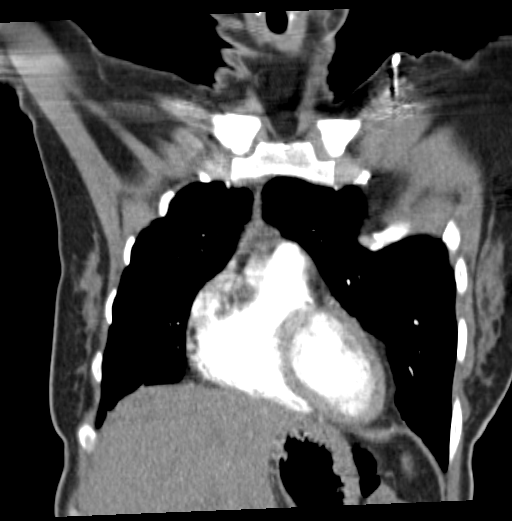
[im 56/112  soft-tissue]
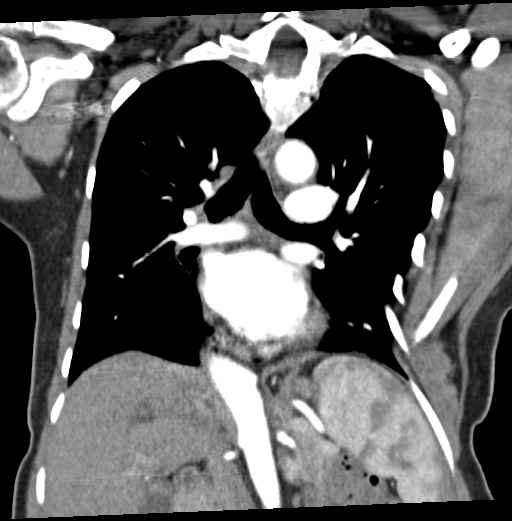
[im 84/112  soft-tissue]
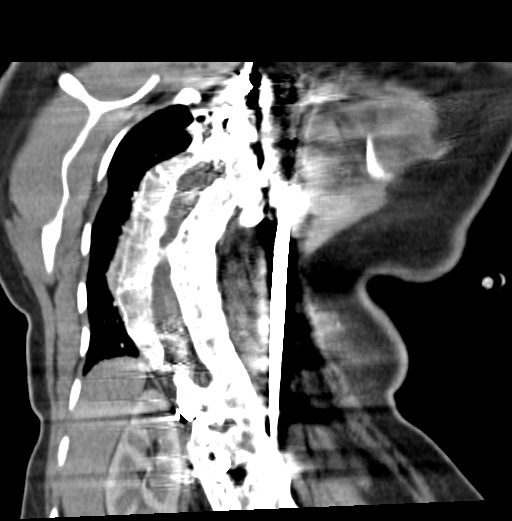

[18 of 46 positions shown; findings below may reference images not displayed]

FINDINGS: Mediastinum/Lymph Nodes: Pulmonary arteries are well visualized. No
filling defect or significant pulmonary embolus demonstrated by CTA.
Intact thoracic aorta. Major branch vessels are patent. No aneurysm
or dissection. No pericardial effusion. Normal heart size. Negative
for adenopathy. Left IJ port catheter tip extends into the SVC.

Lungs/Pleura: Minor scattered anterior upper lobe and subpleural
ground-glass opacities, image 64, new since the prior study, and
could represent areas of mild alveolitis. Trachea and central
airways are patent. Hypoventilatory changes of the lower lobes
related to the scoliosis. No pleural fluid or pneumothorax.

Upper abdomen: No acute upper abdominal finding. Partially imaged
hypodense left hepatic lesion with peripheral nodular enhancement,
suspect hemangioma, image 132.

Musculoskeletal: Marked rotatory dextroscoliosis of the spine with
fusion hardware noted. Hardware creates artifact through the abdomen
and lower chest. No definite acute osseous finding or suspicious
osseous lesion appreciated.

Chest wall demonstrates overall decrease in the right breast
irregular tissue compared to the PET-CT. Stable low-density
nonspecific cystic lesion in the left breast measuring 2.7 x 1.7 cm,
image 83.

Review of the MIP images confirms the above findings.
IMPRESSION: Negative for significant acute pulmonary embolus.

Minor anterior bilateral upper lobe patchy and subpleural
ground-glass opacities remain nonspecific, suspect mild scattered
alveolitis.

Other findings as above.

## 2016-03-23 ENCOUNTER — Inpatient Hospital Stay: Payer: 59

## 2016-03-23 ENCOUNTER — Other Ambulatory Visit: Payer: Self-pay | Admitting: Hematology and Oncology

## 2016-03-23 ENCOUNTER — Inpatient Hospital Stay: Payer: 59 | Attending: Hematology and Oncology

## 2016-03-23 ENCOUNTER — Inpatient Hospital Stay (HOSPITAL_BASED_OUTPATIENT_CLINIC_OR_DEPARTMENT_OTHER): Payer: 59 | Admitting: Hematology and Oncology

## 2016-03-23 ENCOUNTER — Other Ambulatory Visit: Payer: Self-pay

## 2016-03-23 VITALS — BP 139/32 | HR 102 | Temp 95.9°F | Resp 18 | Ht 61.0 in | Wt 109.5 lb

## 2016-03-23 DIAGNOSIS — J329 Chronic sinusitis, unspecified: Secondary | ICD-10-CM

## 2016-03-23 DIAGNOSIS — F419 Anxiety disorder, unspecified: Secondary | ICD-10-CM | POA: Insufficient documentation

## 2016-03-23 DIAGNOSIS — K219 Gastro-esophageal reflux disease without esophagitis: Secondary | ICD-10-CM | POA: Diagnosis not present

## 2016-03-23 DIAGNOSIS — C779 Secondary and unspecified malignant neoplasm of lymph node, unspecified: Secondary | ICD-10-CM

## 2016-03-23 DIAGNOSIS — Z170 Estrogen receptor positive status [ER+]: Secondary | ICD-10-CM

## 2016-03-23 DIAGNOSIS — C50811 Malignant neoplasm of overlapping sites of right female breast: Secondary | ICD-10-CM | POA: Diagnosis not present

## 2016-03-23 DIAGNOSIS — Z9013 Acquired absence of bilateral breasts and nipples: Secondary | ICD-10-CM | POA: Insufficient documentation

## 2016-03-23 DIAGNOSIS — Z809 Family history of malignant neoplasm, unspecified: Secondary | ICD-10-CM | POA: Insufficient documentation

## 2016-03-23 DIAGNOSIS — G629 Polyneuropathy, unspecified: Secondary | ICD-10-CM | POA: Diagnosis not present

## 2016-03-23 DIAGNOSIS — Z8041 Family history of malignant neoplasm of ovary: Secondary | ICD-10-CM | POA: Insufficient documentation

## 2016-03-23 DIAGNOSIS — Z87891 Personal history of nicotine dependence: Secondary | ICD-10-CM | POA: Insufficient documentation

## 2016-03-23 DIAGNOSIS — R0602 Shortness of breath: Secondary | ICD-10-CM | POA: Diagnosis not present

## 2016-03-23 DIAGNOSIS — D649 Anemia, unspecified: Secondary | ICD-10-CM | POA: Diagnosis not present

## 2016-03-23 DIAGNOSIS — C50111 Malignant neoplasm of central portion of right female breast: Secondary | ICD-10-CM

## 2016-03-23 DIAGNOSIS — Z5112 Encounter for antineoplastic immunotherapy: Secondary | ICD-10-CM | POA: Insufficient documentation

## 2016-03-23 DIAGNOSIS — R439 Unspecified disturbances of smell and taste: Secondary | ICD-10-CM | POA: Insufficient documentation

## 2016-03-23 DIAGNOSIS — F329 Major depressive disorder, single episode, unspecified: Secondary | ICD-10-CM | POA: Insufficient documentation

## 2016-03-23 DIAGNOSIS — Z803 Family history of malignant neoplasm of breast: Secondary | ICD-10-CM | POA: Diagnosis not present

## 2016-03-23 DIAGNOSIS — Z9221 Personal history of antineoplastic chemotherapy: Secondary | ICD-10-CM | POA: Insufficient documentation

## 2016-03-23 DIAGNOSIS — Z862 Personal history of diseases of the blood and blood-forming organs and certain disorders involving the immune mechanism: Secondary | ICD-10-CM | POA: Diagnosis not present

## 2016-03-23 LAB — CBC WITH DIFFERENTIAL/PLATELET
Basophils Absolute: 0 10*3/uL (ref 0–0.1)
Basophils Relative: 0 %
Eosinophils Absolute: 0.2 10*3/uL (ref 0–0.7)
Eosinophils Relative: 2 %
HCT: 30.5 % — ABNORMAL LOW (ref 35.0–47.0)
Hemoglobin: 10.4 g/dL — ABNORMAL LOW (ref 12.0–16.0)
Lymphocytes Relative: 14 %
Lymphs Abs: 1.1 10*3/uL (ref 1.0–3.6)
MCH: 30.6 pg (ref 26.0–34.0)
MCHC: 34.1 g/dL (ref 32.0–36.0)
MCV: 89.7 fL (ref 80.0–100.0)
Monocytes Absolute: 0.5 10*3/uL (ref 0.2–0.9)
Monocytes Relative: 6 %
Neutro Abs: 6.1 10*3/uL (ref 1.4–6.5)
Neutrophils Relative %: 78 %
Platelets: 467 10*3/uL — ABNORMAL HIGH (ref 150–440)
RBC: 3.4 MIL/uL — ABNORMAL LOW (ref 3.80–5.20)
RDW: 13.4 % (ref 11.5–14.5)
WBC: 7.8 10*3/uL (ref 3.6–11.0)

## 2016-03-23 LAB — COMPREHENSIVE METABOLIC PANEL
ALT: 28 U/L (ref 14–54)
AST: 31 U/L (ref 15–41)
Albumin: 3.8 g/dL (ref 3.5–5.0)
Alkaline Phosphatase: 100 U/L (ref 38–126)
Anion gap: 8 (ref 5–15)
BUN: 14 mg/dL (ref 6–20)
CO2: 27 mmol/L (ref 22–32)
Calcium: 9.5 mg/dL (ref 8.9–10.3)
Chloride: 103 mmol/L (ref 101–111)
Creatinine, Ser: 0.61 mg/dL (ref 0.44–1.00)
GFR calc Af Amer: >60 mL/min (ref >60)
GFR calc non Af Amer: >60 mL/min (ref >60)
Glucose, Bld: 135 mg/dL — ABNORMAL HIGH (ref 65–99)
Potassium: 4 mmol/L (ref 3.5–5.1)
Sodium: 138 mmol/L (ref 135–145)
Total Bilirubin: 0.5 mg/dL (ref 0.3–1.2)
Total Protein: 7.3 g/dL (ref 6.5–8.1)

## 2016-03-23 MED ORDER — DIPHENHYDRAMINE HCL 25 MG PO CAPS
50.0000 mg | ORAL_CAPSULE | Freq: Once | ORAL | Status: AC
  Administered 2016-03-23: 50 mg via ORAL
  Filled 2016-03-23: qty 2

## 2016-03-23 MED ORDER — TRASTUZUMAB CHEMO INJECTION 440 MG
294.0000 mg | Freq: Once | INTRAVENOUS | Status: AC
  Administered 2016-03-23: 294 mg via INTRAVENOUS
  Filled 2016-03-23: qty 14

## 2016-03-23 MED ORDER — HEPARIN SOD (PORK) LOCK FLUSH 100 UNIT/ML IV SOLN
500.0000 [IU] | Freq: Once | INTRAVENOUS | Status: AC | PRN
  Administered 2016-03-23: 500 [IU]
  Filled 2016-03-23: qty 5

## 2016-03-23 MED ORDER — ACETAMINOPHEN 325 MG PO TABS
650.0000 mg | ORAL_TABLET | Freq: Once | ORAL | Status: AC
  Administered 2016-03-23: 650 mg via ORAL
  Filled 2016-03-23: qty 2

## 2016-03-23 MED ORDER — SODIUM CHLORIDE 0.9 % IV SOLN
Freq: Once | INTRAVENOUS | Status: AC
  Administered 2016-03-23: 16:00:00 via INTRAVENOUS
  Filled 2016-03-23: qty 1000

## 2016-03-23 NOTE — Progress Notes (Signed)
Pt placed on antibiotic for what they believe was a sinus infection with a low grade fever yesterday morning at about 100 F.took tylenol and came back down  Recent breast surgery with some soreness

## 2016-03-24 LAB — CANCER ANTIGEN 27.29: CA 27.29: 15.5 U/mL (ref 0.0–38.6)

## 2016-04-03 ENCOUNTER — Telehealth: Payer: Self-pay | Admitting: *Deleted

## 2016-04-03 NOTE — Telephone Encounter (Signed)
Spoke with patient regarding appointment time to see Dr. Baruch Gouty.  Tuesday April 10, 2016 at 900am.   She verbalized understanding.

## 2016-04-09 ENCOUNTER — Encounter: Payer: Self-pay | Admitting: *Deleted

## 2016-04-09 NOTE — Progress Notes (Signed)
  Oncology Nurse Navigator Documentation  Navigator Location: [facility] (04/09/16 1000) Navigator Encounter Type: Telephone (04/09/16 1000) Telephone: Incoming Call (04/09/16 1000)           Treatment Phase: Active Tx (04/09/16 1000) Barriers/Navigation Needs: Education (04/09/16 1000)                          Time Spent with Patient: 45 (04/09/16 1000)   Patient called today with questions regarding next steps.  She has had her bilateral mastectomy and is to meet with Dr. Baruch Gouty for initiation of radiation therapy.  She is also considering breast reconstruction.  Offered support and answered her questions.  Discussed the CARE program to help with fatigue and endurance.  She may consider after reconstruction.  She is to call if she has any other questions or needs.

## 2016-04-10 ENCOUNTER — Ambulatory Visit
Admission: RE | Admit: 2016-04-10 | Discharge: 2016-04-10 | Disposition: A | Discharge status: Home / Self Care | Payer: 59 | Source: Ambulatory Visit | Attending: Radiation Oncology | Admitting: Radiation Oncology

## 2016-04-10 ENCOUNTER — Encounter: Payer: Self-pay | Admitting: Radiation Oncology

## 2016-04-10 VITALS — BP 135/75 | HR 103 | Temp 98.0°F | Ht 61.0 in | Wt 107.3 lb

## 2016-04-10 DIAGNOSIS — C773 Secondary and unspecified malignant neoplasm of axilla and upper limb lymph nodes: Secondary | ICD-10-CM | POA: Diagnosis not present

## 2016-04-10 DIAGNOSIS — Z170 Estrogen receptor positive status [ER+]: Secondary | ICD-10-CM | POA: Insufficient documentation

## 2016-04-10 DIAGNOSIS — C50811 Malignant neoplasm of overlapping sites of right female breast: Secondary | ICD-10-CM

## 2016-04-10 DIAGNOSIS — Z510 Encounter for antineoplastic radiation therapy: Secondary | ICD-10-CM | POA: Insufficient documentation

## 2016-04-10 NOTE — Progress Notes (Signed)
Radiation Oncology Follow up Note  Name: Emma Atkinson   Date:   04/10/2016 MRN:  [id number] DOB: 1966-06-25    This 50 y.o. female presents to the clinic today for stage IIIB (T4 b N1 a M0). Grade 3 invasive mammary carcinoma status post neoadjuvant chemotherapy followed by bilateral mastectomy with complete response triple positive disease.  REFERRING PROVIDER: Idelle Crouch, MD  HPI: Patient is a 50 year old female originally consult in back in November 2016 when she presented with nipple inversion and a right breast mass enlarging in size. Bilateral mammogram and ultrasound in October 2016 showed a 4.1 x 4 cm highly suspicious right superior breast mass containing pleomorphic microcalcifications. There is also several satellite lesions 1.3, 0.9 cm, and 0.6 cm plus a 1.3 mass versus complicated cysts in the right breast. There was also one 5 mm node with irregular cortical thickening. 08/17/2015 [facility] core biopsy of the right breast with pathology showing grade 3 invasive mammary carcinoma of no special type triple-positive ER/PR and HER-2/neu overexpressed. Also right axillar biopsy confirmed metastatic invasive carcinoma.. She underwent new adjuvant chemotherapy with Cytoxan and Adriamycin followed by Taxol and she is currently on Herceptin. She underwent bilateral mastectomies with the right breast showing no evidence of disease as well as 5 axillary lymph nodes all negative for metastatic disease. She is healing well at this time. She is now seen for opinion regarding adjuvant radiation therapy status post mastectomy. She is doing well healing nicely. She specifically denies cough or bone pain.  COMPLICATIONS OF TREATMENT: none  FOLLOW UP COMPLIANCE: keeps appointments   PHYSICAL EXAM:  BP 135/75 mmHg  Pulse 103  Temp(Src) 98 F (36.7 C)  Ht '5\' 1"'  (1.549 m)  Wt 107 lb 4.1 oz (48.65 kg)  BMI 20.28 kg/m2 Patient is status post bilateral mastectomies with incisions healing well. No  chest wall mass or nodularity is pre-shaded. No axillary or supraclavicular adenopathy is noted. Well-developed well-nourished patient in NAD. HEENT reveals PERLA, EOMI, discs not visualized.  Oral cavity is clear. No oral mucosal lesions are identified. Neck is clear without evidence of cervical or supraclavicular adenopathy. Lungs are clear to A&P. Cardiac examination is essentially unremarkable with regular rate and rhythm without murmur rub or thrill. Abdomen is benign with no organomegaly or masses noted. Motor sensory and DTR levels are equal and symmetric in the upper and lower extremities. Cranial nerves II through XII are grossly intact. Proprioception is intact. No peripheral adenopathy or edema is identified. No motor or sensory levels are noted. Crude visual fields are within normal range.  RADIOLOGY RESULTS: Prior ultrasounds mammograms reviewed as well as PET CT and chest CT  PLAN: At this time based on the stage IIIB nature of her disease triple positive disease would recommend chest wall and peripheral lymphatic radiation to prevent local regional recurrence. I would plan on delivering 5000 cGy to her right chest wall and peripheral lymphatics. Do not see an indication for a boost based on the negative histology at the time of mastectomy. Patient is also interested in breast reconstruction and I have assured her that it may be slightly more difficult although not a problem for the plastic surgeons who she will will be referred to. Risks and benefits of treatment including skin reaction fatigue alteration of blood counts possible inclusion of superficial lung and slight possibility of lymphedema of her right upper extremity all were explained in detail to the patient and her husband. Both seem to comprehend my treatment plan well.  I personally ordered CT simulation in about another week to allow further healing.  I would like to take this opportunity to thank you for allowing me to participate  in the care of your patient.Armstead Peaks., MD

## 2016-04-13 ENCOUNTER — Inpatient Hospital Stay: Payer: 59

## 2016-04-13 VITALS — BP 119/79 | HR 82 | Temp 97.5°F | Resp 18

## 2016-04-13 DIAGNOSIS — C50111 Malignant neoplasm of central portion of right female breast: Secondary | ICD-10-CM

## 2016-04-13 DIAGNOSIS — G629 Polyneuropathy, unspecified: Secondary | ICD-10-CM | POA: Diagnosis not present

## 2016-04-13 DIAGNOSIS — C779 Secondary and unspecified malignant neoplasm of lymph node, unspecified: Secondary | ICD-10-CM | POA: Diagnosis not present

## 2016-04-13 DIAGNOSIS — Z170 Estrogen receptor positive status [ER+]: Secondary | ICD-10-CM | POA: Diagnosis not present

## 2016-04-13 DIAGNOSIS — Z5112 Encounter for antineoplastic immunotherapy: Secondary | ICD-10-CM | POA: Diagnosis not present

## 2016-04-13 DIAGNOSIS — C50811 Malignant neoplasm of overlapping sites of right female breast: Secondary | ICD-10-CM | POA: Diagnosis not present

## 2016-04-13 DIAGNOSIS — F419 Anxiety disorder, unspecified: Secondary | ICD-10-CM | POA: Diagnosis not present

## 2016-04-13 DIAGNOSIS — J329 Chronic sinusitis, unspecified: Secondary | ICD-10-CM | POA: Diagnosis not present

## 2016-04-13 DIAGNOSIS — R439 Unspecified disturbances of smell and taste: Secondary | ICD-10-CM | POA: Diagnosis not present

## 2016-04-13 DIAGNOSIS — D649 Anemia, unspecified: Secondary | ICD-10-CM | POA: Diagnosis not present

## 2016-04-13 MED ORDER — SODIUM CHLORIDE 0.9 % IV SOLN
Freq: Once | INTRAVENOUS | Status: AC
  Administered 2016-04-13: 10:00:00 via INTRAVENOUS
  Filled 2016-04-13: qty 1000

## 2016-04-13 MED ORDER — ACETAMINOPHEN 325 MG PO TABS
650.0000 mg | ORAL_TABLET | Freq: Once | ORAL | Status: AC
  Administered 2016-04-13: 650 mg via ORAL
  Filled 2016-04-13: qty 2

## 2016-04-13 MED ORDER — HEPARIN SOD (PORK) LOCK FLUSH 100 UNIT/ML IV SOLN
500.0000 [IU] | Freq: Once | INTRAVENOUS | Status: AC | PRN
  Administered 2016-04-13: 500 [IU]
  Filled 2016-04-13: qty 5

## 2016-04-13 MED ORDER — TRASTUZUMAB CHEMO INJECTION 440 MG
294.0000 mg | Freq: Once | INTRAVENOUS | Status: AC
  Administered 2016-04-13: 294 mg via INTRAVENOUS
  Filled 2016-04-13: qty 14

## 2016-04-13 MED ORDER — SODIUM CHLORIDE 0.9% FLUSH
10.0000 mL | INTRAVENOUS | Status: DC | PRN
  Administered 2016-04-13: 10 mL
  Filled 2016-04-13: qty 10

## 2016-04-13 MED ORDER — DIPHENHYDRAMINE HCL 25 MG PO CAPS
50.0000 mg | ORAL_CAPSULE | Freq: Once | ORAL | Status: AC
  Administered 2016-04-13: 50 mg via ORAL
  Filled 2016-04-13: qty 2

## 2016-04-22 IMAGING — CR DG CHEST 2V
2 series · 2 of 2 positions shown · non-contrast
Comparison: Chest radiograph September 27, 2015 and chest CT December 27, 2015

CLINICAL DATA: History of breast carcinoma. Recent shortness of
breath

EXAM:
CHEST  2 VIEW

[chest pa]
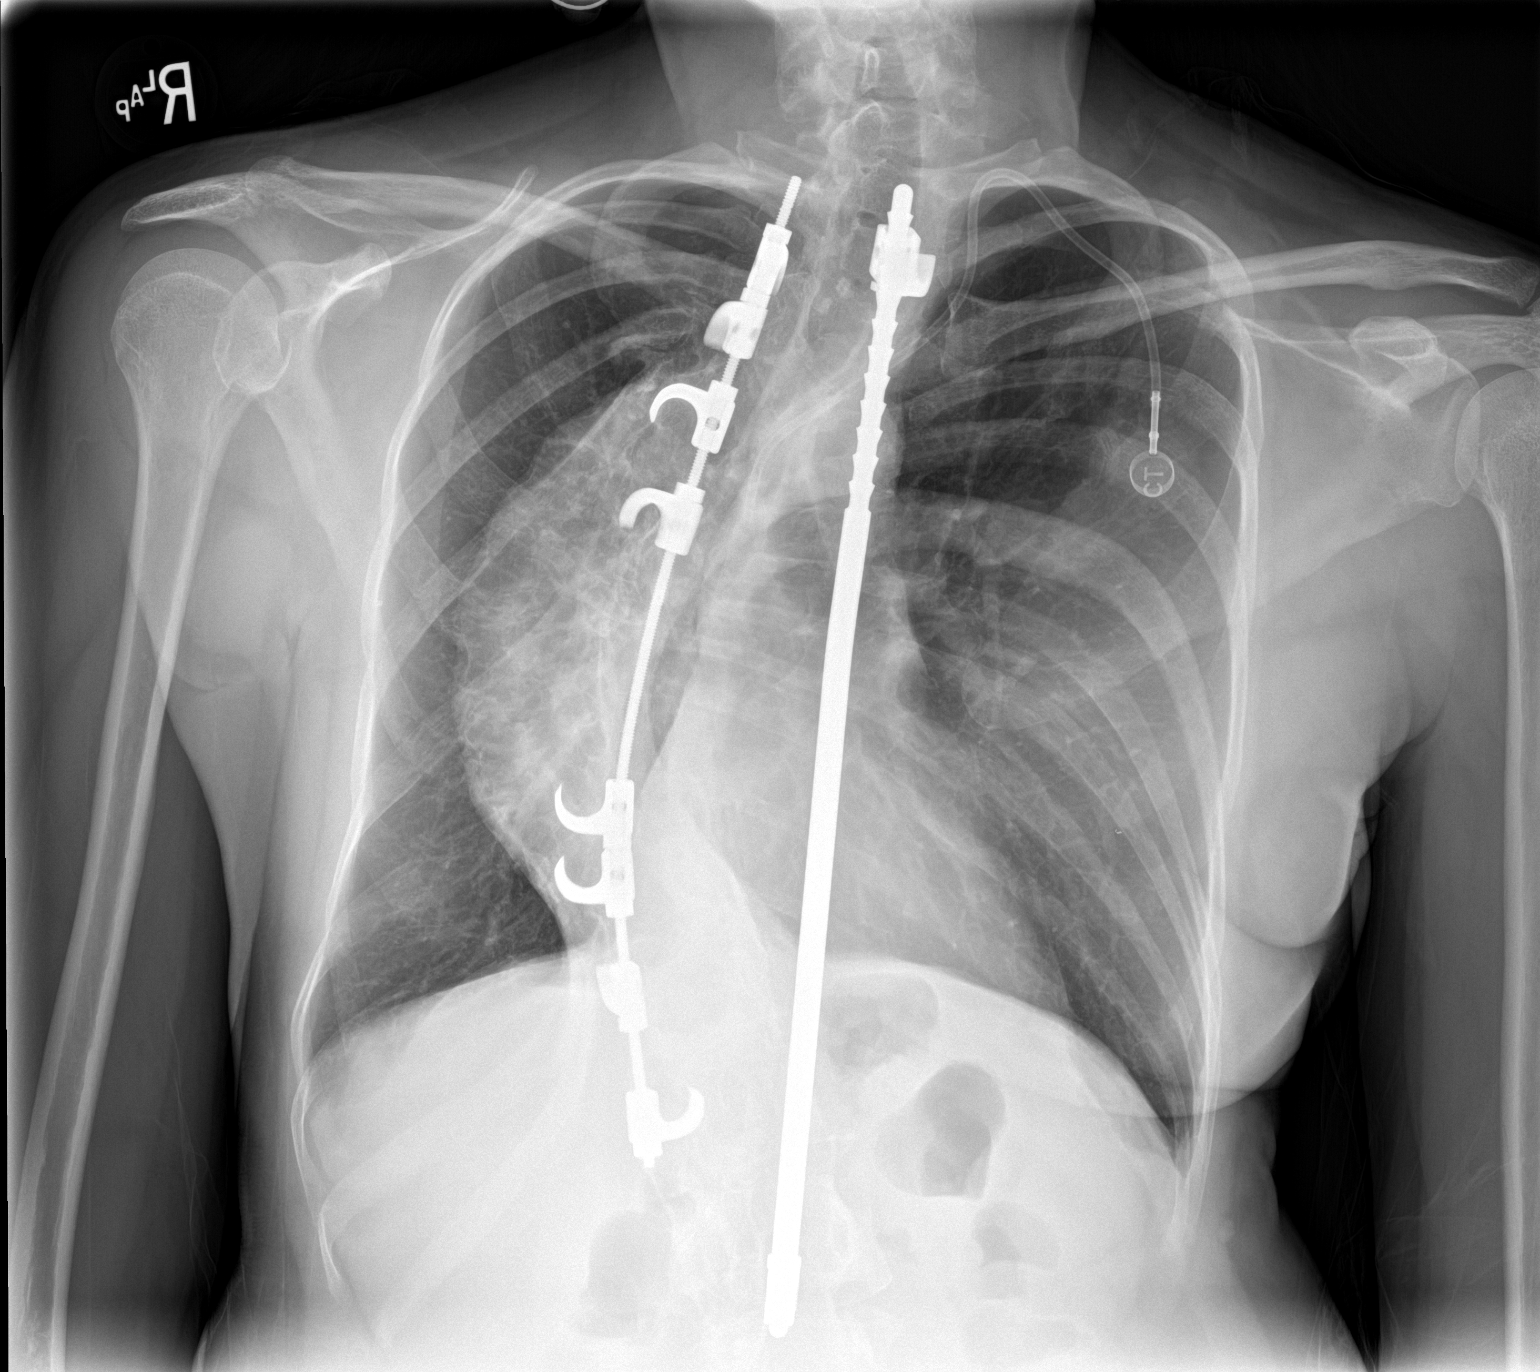

[chest lat]
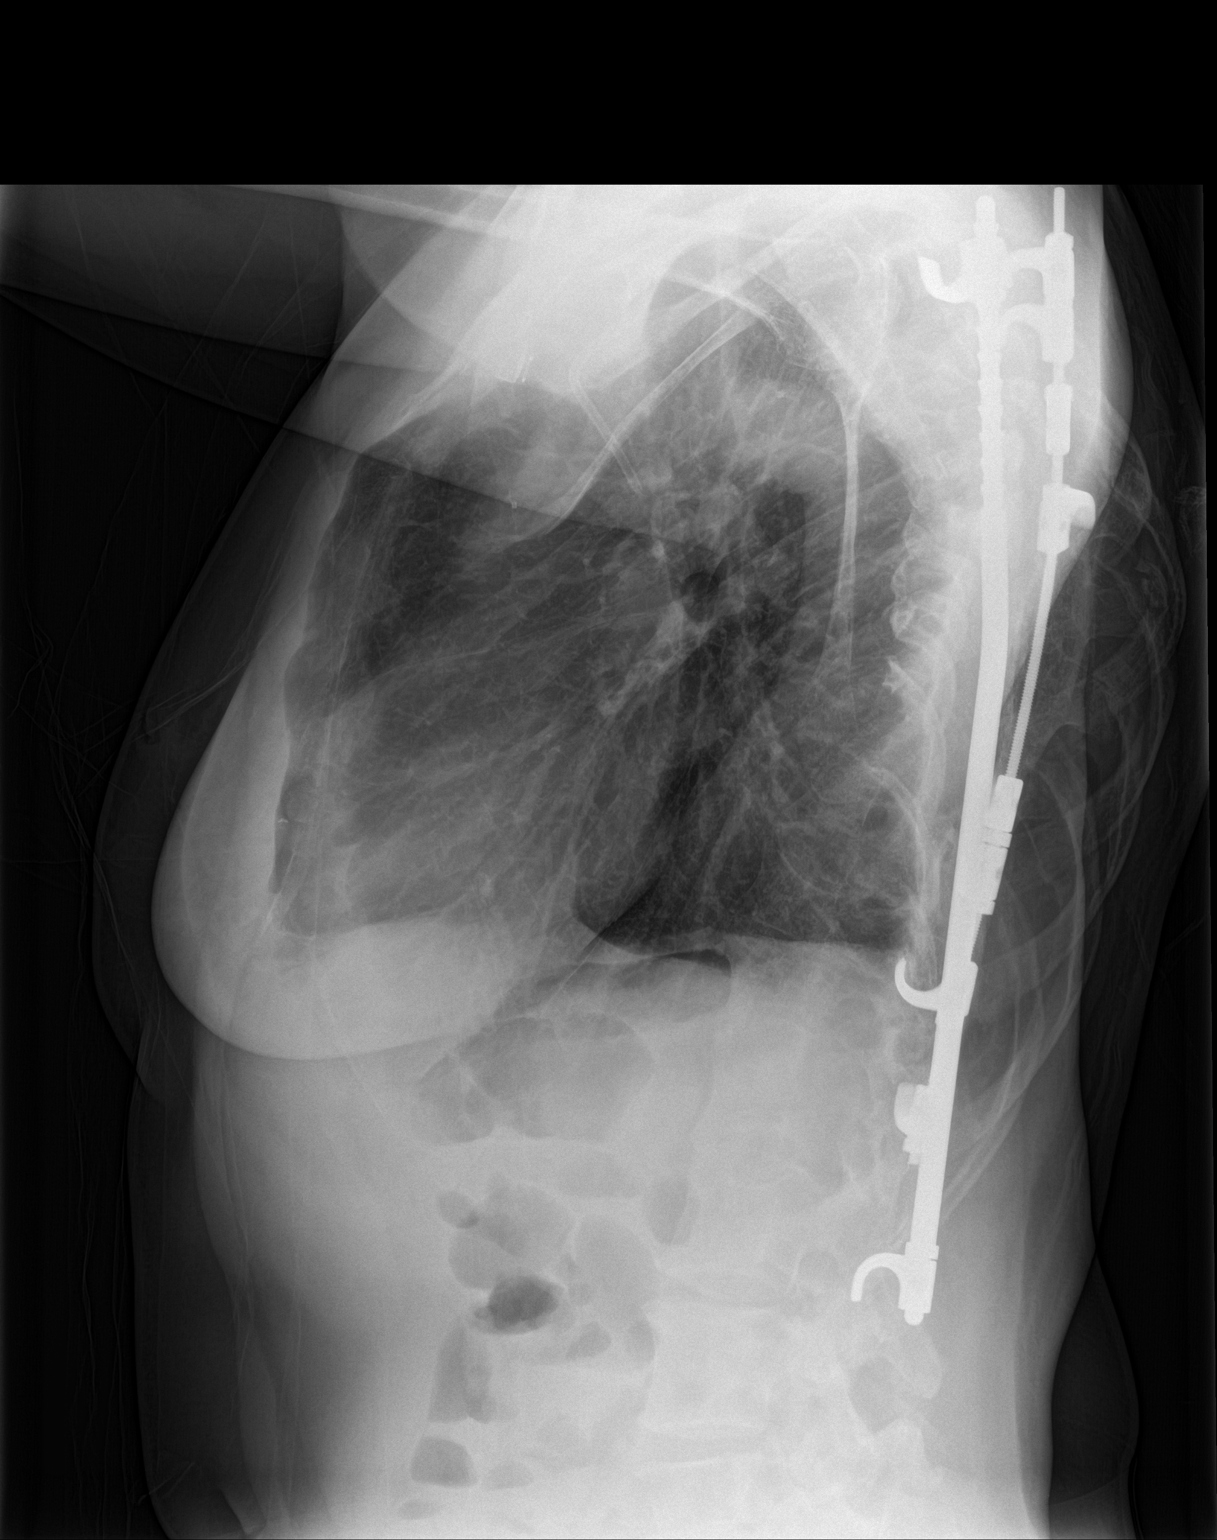

[2 of 2 positions shown; findings below may reference images not displayed]

FINDINGS: Marked scoliosis with rod fixation remains, unchanged. There is no
edema or consolidation. The heart size and pulmonary vascularity are
normal. No blastic or lytic bone lesions are evident. No
pneumothorax. Port-A-Cath tip is in the superior vena cava.
IMPRESSION: No edema or consolidation. Marked scoliosis, stable. Port-A-Cath tip
in superior vena cava. No adenopathy evident.

## 2016-04-26 ENCOUNTER — Ambulatory Visit
Admission: RE | Admit: 2016-04-26 | Discharge: 2016-04-26 | Disposition: A | Discharge status: Home / Self Care | Payer: 59 | Source: Ambulatory Visit | Attending: Radiation Oncology | Admitting: Radiation Oncology

## 2016-04-26 DIAGNOSIS — C773 Secondary and unspecified malignant neoplasm of axilla and upper limb lymph nodes: Secondary | ICD-10-CM | POA: Diagnosis not present

## 2016-04-26 DIAGNOSIS — Z510 Encounter for antineoplastic radiation therapy: Secondary | ICD-10-CM | POA: Diagnosis not present

## 2016-04-26 DIAGNOSIS — C50811 Malignant neoplasm of overlapping sites of right female breast: Secondary | ICD-10-CM | POA: Diagnosis not present

## 2016-04-26 DIAGNOSIS — Z170 Estrogen receptor positive status [ER+]: Secondary | ICD-10-CM | POA: Diagnosis not present

## 2016-04-27 ENCOUNTER — Other Ambulatory Visit: Payer: Self-pay | Admitting: *Deleted

## 2016-04-27 DIAGNOSIS — C50811 Malignant neoplasm of overlapping sites of right female breast: Secondary | ICD-10-CM

## 2016-05-01 DIAGNOSIS — C773 Secondary and unspecified malignant neoplasm of axilla and upper limb lymph nodes: Secondary | ICD-10-CM | POA: Diagnosis not present

## 2016-05-01 DIAGNOSIS — Z510 Encounter for antineoplastic radiation therapy: Secondary | ICD-10-CM | POA: Diagnosis not present

## 2016-05-01 DIAGNOSIS — Z170 Estrogen receptor positive status [ER+]: Secondary | ICD-10-CM | POA: Diagnosis not present

## 2016-05-01 DIAGNOSIS — C50811 Malignant neoplasm of overlapping sites of right female breast: Secondary | ICD-10-CM | POA: Diagnosis not present

## 2016-05-03 ENCOUNTER — Ambulatory Visit
Admission: RE | Admit: 2016-05-03 | Discharge: 2016-05-03 | Disposition: A | Discharge status: Home / Self Care | Payer: 59 | Source: Ambulatory Visit | Attending: Radiation Oncology | Admitting: Radiation Oncology

## 2016-05-03 DIAGNOSIS — C773 Secondary and unspecified malignant neoplasm of axilla and upper limb lymph nodes: Secondary | ICD-10-CM | POA: Diagnosis not present

## 2016-05-03 DIAGNOSIS — C50811 Malignant neoplasm of overlapping sites of right female breast: Secondary | ICD-10-CM | POA: Diagnosis not present

## 2016-05-03 DIAGNOSIS — Z170 Estrogen receptor positive status [ER+]: Secondary | ICD-10-CM | POA: Diagnosis not present

## 2016-05-03 DIAGNOSIS — Z510 Encounter for antineoplastic radiation therapy: Secondary | ICD-10-CM | POA: Diagnosis not present

## 2016-05-04 ENCOUNTER — Encounter: Payer: Self-pay | Admitting: Hematology and Oncology

## 2016-05-04 ENCOUNTER — Other Ambulatory Visit: Payer: Self-pay | Admitting: Hematology and Oncology

## 2016-05-04 ENCOUNTER — Inpatient Hospital Stay: Payer: 59 | Attending: Hematology and Oncology | Admitting: Hematology and Oncology

## 2016-05-04 ENCOUNTER — Inpatient Hospital Stay: Payer: 59

## 2016-05-04 VITALS — BP 129/84 | HR 88 | Temp 98.6°F | Resp 18 | Wt 110.2 lb

## 2016-05-04 DIAGNOSIS — Z170 Estrogen receptor positive status [ER+]: Secondary | ICD-10-CM | POA: Insufficient documentation

## 2016-05-04 DIAGNOSIS — C50111 Malignant neoplasm of central portion of right female breast: Secondary | ICD-10-CM | POA: Diagnosis not present

## 2016-05-04 DIAGNOSIS — Z9221 Personal history of antineoplastic chemotherapy: Secondary | ICD-10-CM | POA: Insufficient documentation

## 2016-05-04 DIAGNOSIS — Z87891 Personal history of nicotine dependence: Secondary | ICD-10-CM | POA: Diagnosis not present

## 2016-05-04 DIAGNOSIS — R0602 Shortness of breath: Secondary | ICD-10-CM | POA: Diagnosis not present

## 2016-05-04 DIAGNOSIS — Z862 Personal history of diseases of the blood and blood-forming organs and certain disorders involving the immune mechanism: Secondary | ICD-10-CM | POA: Insufficient documentation

## 2016-05-04 DIAGNOSIS — Z5112 Encounter for antineoplastic immunotherapy: Secondary | ICD-10-CM | POA: Insufficient documentation

## 2016-05-04 DIAGNOSIS — Z803 Family history of malignant neoplasm of breast: Secondary | ICD-10-CM | POA: Insufficient documentation

## 2016-05-04 DIAGNOSIS — K219 Gastro-esophageal reflux disease without esophagitis: Secondary | ICD-10-CM | POA: Diagnosis not present

## 2016-05-04 DIAGNOSIS — Z87440 Personal history of urinary (tract) infections: Secondary | ICD-10-CM | POA: Insufficient documentation

## 2016-05-04 DIAGNOSIS — Z79899 Other long term (current) drug therapy: Secondary | ICD-10-CM | POA: Insufficient documentation

## 2016-05-04 DIAGNOSIS — G629 Polyneuropathy, unspecified: Secondary | ICD-10-CM | POA: Insufficient documentation

## 2016-05-04 DIAGNOSIS — F329 Major depressive disorder, single episode, unspecified: Secondary | ICD-10-CM | POA: Insufficient documentation

## 2016-05-04 DIAGNOSIS — F419 Anxiety disorder, unspecified: Secondary | ICD-10-CM | POA: Diagnosis not present

## 2016-05-04 DIAGNOSIS — Z8041 Family history of malignant neoplasm of ovary: Secondary | ICD-10-CM | POA: Insufficient documentation

## 2016-05-04 DIAGNOSIS — Z809 Family history of malignant neoplasm, unspecified: Secondary | ICD-10-CM | POA: Diagnosis not present

## 2016-05-04 DIAGNOSIS — Z9013 Acquired absence of bilateral breasts and nipples: Secondary | ICD-10-CM | POA: Diagnosis not present

## 2016-05-04 DIAGNOSIS — C50811 Malignant neoplasm of overlapping sites of right female breast: Secondary | ICD-10-CM

## 2016-05-04 DIAGNOSIS — D649 Anemia, unspecified: Secondary | ICD-10-CM | POA: Insufficient documentation

## 2016-05-04 LAB — CBC WITH DIFFERENTIAL/PLATELET
Basophils Absolute: 0 10*3/uL (ref 0–0.1)
Basophils Relative: 0 %
Eosinophils Absolute: 0.3 10*3/uL (ref 0–0.7)
Eosinophils Relative: 5 %
HCT: 35.1 % (ref 35.0–47.0)
Hemoglobin: 11.8 g/dL — ABNORMAL LOW (ref 12.0–16.0)
Lymphocytes Relative: 16 %
Lymphs Abs: 0.9 10*3/uL — ABNORMAL LOW (ref 1.0–3.6)
MCH: 29.8 pg (ref 26.0–34.0)
MCHC: 33.7 g/dL (ref 32.0–36.0)
MCV: 88.3 fL (ref 80.0–100.0)
Monocytes Absolute: 0.4 10*3/uL (ref 0.2–0.9)
Monocytes Relative: 6 %
Neutro Abs: 4.4 10*3/uL (ref 1.4–6.5)
Neutrophils Relative %: 73 %
Platelets: 311 10*3/uL (ref 150–440)
RBC: 3.98 MIL/uL (ref 3.80–5.20)
RDW: 14.5 % (ref 11.5–14.5)
WBC: 6 10*3/uL (ref 3.6–11.0)

## 2016-05-04 LAB — COMPREHENSIVE METABOLIC PANEL
ALT: 18 U/L (ref 14–54)
AST: 24 U/L (ref 15–41)
Albumin: 4.2 g/dL (ref 3.5–5.0)
Alkaline Phosphatase: 67 U/L (ref 38–126)
Anion gap: 7 (ref 5–15)
BUN: 13 mg/dL (ref 6–20)
CO2: 26 mmol/L (ref 22–32)
Calcium: 9.3 mg/dL (ref 8.9–10.3)
Chloride: 107 mmol/L (ref 101–111)
Creatinine, Ser: 0.65 mg/dL (ref 0.44–1.00)
GFR calc Af Amer: >60 mL/min (ref >60)
GFR calc non Af Amer: >60 mL/min (ref >60)
Glucose, Bld: 103 mg/dL — ABNORMAL HIGH (ref 65–99)
Potassium: 3.8 mmol/L (ref 3.5–5.1)
Sodium: 140 mmol/L (ref 135–145)
Total Bilirubin: 0.7 mg/dL (ref 0.3–1.2)
Total Protein: 7.1 g/dL (ref 6.5–8.1)

## 2016-05-04 LAB — FERRITIN: Ferritin: 34 ng/mL (ref 11–307)

## 2016-05-04 MED ORDER — ACETAMINOPHEN 325 MG PO TABS
650.0000 mg | ORAL_TABLET | Freq: Once | ORAL | Status: AC
Start: 2016-05-04 — End: 2016-05-04
  Administered 2016-05-04: 650 mg via ORAL
  Filled 2016-05-04: qty 2

## 2016-05-04 MED ORDER — DIPHENHYDRAMINE HCL 25 MG PO CAPS
50.0000 mg | ORAL_CAPSULE | Freq: Once | ORAL | Status: AC
  Administered 2016-05-04: 50 mg via ORAL
  Filled 2016-05-04: qty 2

## 2016-05-04 MED ORDER — SODIUM CHLORIDE 0.9 % IV SOLN
Freq: Once | INTRAVENOUS | Status: AC
  Administered 2016-05-04: 15:00:00 via INTRAVENOUS
  Filled 2016-05-04: qty 1000

## 2016-05-04 MED ORDER — HEPARIN SOD (PORK) LOCK FLUSH 100 UNIT/ML IV SOLN
INTRAVENOUS | Status: AC
  Filled 2016-05-04: qty 5

## 2016-05-04 MED ORDER — SODIUM CHLORIDE 0.9% FLUSH
10.0000 mL | INTRAVENOUS | Status: DC | PRN
  Filled 2016-05-04: qty 10

## 2016-05-04 MED ORDER — TRASTUZUMAB CHEMO 150 MG IV SOLR
294.0000 mg | Freq: Once | INTRAVENOUS | Status: AC
  Administered 2016-05-04: 294 mg via INTRAVENOUS
  Filled 2016-05-04: qty 14

## 2016-05-04 MED ORDER — HEPARIN SOD (PORK) LOCK FLUSH 100 UNIT/ML IV SOLN
500.0000 [IU] | Freq: Once | INTRAVENOUS | Status: AC
  Administered 2016-05-04: 500 [IU] via INTRAVENOUS

## 2016-05-04 NOTE — Progress Notes (Signed)
[facility] day:  03/23/2016   Chief Complaint: Emma Atkinson is a 50 y.o. female with clinical stage IIB Her2/neu+ right breast cancer who is seen for assessment following bilateral mastectomy and prior to continuation of every 3 week Herceptin.  HPI:  The patient was last seen in the medical oncology clinic on 02/21/2016.  At that time, she described a change in taste sensation.  She had a grade I neuropathy.  Exam was stable.  We discussed the timing of surgery, Herceptin, and need for echocardiogram. A slight modification in schedule was made.  She underwent echo on 02/23/2016.  EF was 55-65%.  She received Herceptin on 02/24/2016.  She underwent bilateral mastectomy on 03/08/2016 with Dr. Rochel Brome.  Pathology revealed no residual disease in the right breast.  Five lymph nodes were negative for malignancy.  Pathologic stage was ypT0 ypN0.  Left breast revealed benign intraductal papillomas and cysts adjacent to the biopsy site.  There was radial scar and sclerosing adenosis associated with calcifications.  There was no atypia or malignancy.  Symptomatically, she has been sore under her arm post surgery.  She is using Tylenol for pain.  She notes nasal congestion and sinus symptoms.  She was started on Levaquin by Dr. Doy Hutching.  She has had no fever since initiation of antibiotics.   Past Medical History  Diagnosis Date  . Breast cancer ([place])   . Anxiety   . Depression   . Anemia     H/O  . Shortness of breath dyspnea   . GERD (gastroesophageal reflux disease)   . Chronic kidney disease 12-2015    PYLONEPHRITIS  . Complication of anesthesia     HARD TO WAKE UP AFTER TONSILLECTOMY AS A CHILD BUT NO PROBLEMS SINCE  . Complication of anesthesia     PT STATES THAT BACK IN OR DURING PORT PLACMENT IN 2016 THAT SHE BECAME VEERY FLUSHED, LIGHT HEADED AND CRNA SAID ANTIBIOTIC MAY BE GOING IN IN TO FAST-RATE DECREASED AND PTS SYMPTOMS RESOLVED  IMMEDIATELY    Past Surgical History  Procedure Laterality Date  . Breast biopsy Bilateral     08/17/2015   . Abdominal hysterectomy  2001  . Tonsillectomy    . Back surgery      FOR SCOLIOSIS-HERRINGTON RODS IN PLACE  . Portacath placement N/A 08/30/2015    Procedure: INSERTION PORT-A-CATH;  Surgeon: Leonie Green, MD;  Location: [facility] ORS;  Service: General;  Laterality: N/A;  . Simple mastectomy with axillary sentinel node biopsy Bilateral 03/08/2016    Procedure: SIMPLE MASTECTOMY;  Surgeon: Leonie Green, MD;  Location: [facility] ORS;  Service: General;  Laterality: Bilateral;  . Sentinel node biopsy Bilateral 03/08/2016    Procedure: SENTINEL NODE BIOPSY;  Surgeon: Leonie Green, MD;  Location: [facility] ORS;  Service: General;  Laterality: Bilateral;    Family History  Problem Relation Age of Onset  . Breast cancer Maternal Aunt 64  . Cancer Father   . Cancer Sister   . Cancer Maternal Grandfather     Social History:  reports that she quit smoking about 16 years ago. Her smoking use included Cigarettes. She has a 10 pack-year smoking history. She does not have any smokeless tobacco history on file. She reports that she drinks alcohol. She reports that she does not use illicit drugs.  The patient is accompanied by her husband today.  Allergies:  Allergies  Allergen Reactions  . Ceftin [Cefuroxime Axetil] Rash  Current Medications: Current Outpatient Prescriptions  Medication Sig Dispense Refill  . acetaminophen (TYLENOL) 500 MG tablet Take 1,000 mg by mouth every 6 (six) hours as needed.    . ALPRAZolam (XANAX) 0.25 MG tablet Take 1/4 tablet up to three times daily QHS    . Dexlansoprazole (DEXILANT) 30 MG capsule Take 30 mg by mouth as needed.     . diphenhydrAMINE (SOMINEX) 25 MG tablet Take 25 mg by mouth at bedtime. Reported on 01/12/2016    . lidocaine-prilocaine (EMLA) cream Apply 1 application topically as needed. Apply at least 1-2 hours before the port is  to be used. 30 g 1  . LORazepam (ATIVAN) 0.5 MG tablet Take 1 tablet (0.5 mg total) by mouth every 6 (six) hours as needed (Nausea or vomiting). (Patient not taking: Reported on 04/10/2016) 30 tablet 0  . Multiple Vitamin (MULTIVITAMIN) capsule Take 1 capsule by mouth daily.    Marland Kitchen HYDROcodone-acetaminophen (NORCO/VICODIN) 5-325 MG tablet Take 1-2 tablets by mouth every 4 (four) hours as needed for moderate pain. (Patient not taking: Reported on 03/23/2016) 16 tablet 0   No current facility-administered medications for this visit.    Review of Systems:  GENERAL:  Feels good.  No fevers or sweats.  Weight down 4 pounds. PERFORMANCE STATUS (ECOG):  1 HEENT:  No visual changes, runny nose, sore throat, mouth sores or tenderness. Lungs: No shortness of breath or cough.  No pleuritic chest pain.  No hemoptysis. Cardiac:  No chest pain, palpitations, orthopnea, or PND. GI:  Appetite 100%.  Constipation.  No nausea, vomiting, diarrhea, melena or hematochezia. GU:  No urgency, frequency, dysuria, or hematuria. Musculoskeletal:  No back pain.  No joint pain.  No muscle tenderness. Extremities:  No pain or swelling. Skin:  No rashes or skin changes. Neuro:  Little numbness/tingling in tips of fingers.  No weakness, balance or coordination issues. Endocrine:  No diabetes, thyroid issues, hot flashes or night sweats. Psych:  No mood changes. Pain:  No focal pain. Review of systems:  All other systems reviewed and found to be negative.  Physical Exam: Blood pressure 139/32, pulse 102, temperature 95.9 F (35.5 C), temperature source Tympanic, resp. rate 18, height '5\' 1"'  (1.549 m), weight 109 lb 7.3 oz (49.65 kg), SpO2 100 %. O2 sats 94% with ambulation. GENERAL:  Thin woman sitting comfortably in the exam room in no acute distress.  MENTAL STATUS:  Alert and oriented to person, place and time. HEAD:  Long blonde wig.  Normocephalic, atraumatic, face symmetric (thin), no Cushingoid features. EYES:  Blue  eyes.  Pupils equal round and reactive to light and accomodation.  No conjunctivitis or scleral icterus. ENT:  Oropharynx clear without lesion.  Tongue normal. Mucous membranes moist.  RESPIRATORY:  Clear to auscultation without rales, wheezes or rhonchi. CARDIOVASCULAR:  Regular rate and rhythm without murmur, rub or gallop. No JVD. BREASTS:  s/p bilateral mastectomy.  Incisions healing well.  No erythema or nodularity. ABDOMEN:  Soft, non-tender, with active bowel sounds, and no hepatosplenomegaly.  No masses. SKIN:  No rashes, ulcers or lesions. EXTREMITIES: No edema, no skin discoloration or tenderness.  No palpable cords. LYMPH NODES: No palpable cervical, supraclavicular, axillary or inguinal adenopathy  NEUROLOGICAL: Unremarkable. PSYCH:  Appropriate.    Appointment on 03/23/2016  Component Date Value Ref Range Status  . WBC 03/23/2016 7.8  3.6 - 11.0 K/uL Final  . RBC 03/23/2016 3.40* 3.80 - 5.20 MIL/uL Final  . Hemoglobin 03/23/2016 10.4* 12.0 - 16.0 g/dL Final  .  HCT 03/23/2016 30.5* 35.0 - 47.0 % Final  . MCV 03/23/2016 89.7  80.0 - 100.0 fL Final  . MCH 03/23/2016 30.6  26.0 - 34.0 pg Final  . MCHC 03/23/2016 34.1  32.0 - 36.0 g/dL Final  . RDW 03/23/2016 13.4  11.5 - 14.5 % Final  . Platelets 03/23/2016 467* 150 - 440 K/uL Final  . Neutrophils Relative % 03/23/2016 78   Final  . Neutro Abs 03/23/2016 6.1  1.4 - 6.5 K/uL Final  . Lymphocytes Relative 03/23/2016 14   Final  . Lymphs Abs 03/23/2016 1.1  1.0 - 3.6 K/uL Final  . Monocytes Relative 03/23/2016 6   Final  . Monocytes Absolute 03/23/2016 0.5  0.2 - 0.9 K/uL Final  . Eosinophils Relative 03/23/2016 2   Final  . Eosinophils Absolute 03/23/2016 0.2  0 - 0.7 K/uL Final  . Basophils Relative 03/23/2016 0   Final  . Basophils Absolute 03/23/2016 0.0  0 - 0.1 K/uL Final  . CA 27.29 03/23/2016 15.5  0.0 - 38.6 U/mL Final   Comment: (NOTE) [facility]/ACS methodology Performed At: [facility] [place], [place] [id number] Lindon Romp MD GG:[id number]   . Sodium 03/23/2016 138  135 - 145 mmol/L Final  . Potassium 03/23/2016 4.0  3.5 - 5.1 mmol/L Final  . Chloride 03/23/2016 103  101 - 111 mmol/L Final  . CO2 03/23/2016 27  22 - 32 mmol/L Final  . Glucose, Bld 03/23/2016 135* 65 - 99 mg/dL Final  . BUN 03/23/2016 14  6 - 20 mg/dL Final  . Creatinine, Ser 03/23/2016 0.61  0.44 - 1.00 mg/dL Final  . Calcium 03/23/2016 9.5  8.9 - 10.3 mg/dL Final  . Total Protein 03/23/2016 7.3  6.5 - 8.1 g/dL Final  . Albumin 03/23/2016 3.8  3.5 - 5.0 g/dL Final  . AST 03/23/2016 31  15 - 41 U/L Final  . ALT 03/23/2016 28  14 - 54 U/L Final  . Alkaline Phosphatase 03/23/2016 100  38 - 126 U/L Final  . Total Bilirubin 03/23/2016 0.5  0.3 - 1.2 mg/dL Final  . GFR calc non Af Amer 03/23/2016 >60  >60 mL/min Final  . GFR calc Af Amer 03/23/2016 >60  >60 mL/min Final   Comment: (NOTE) The eGFR has been calculated using the CKD EPI equation. This calculation has not been validated in all clinical situations. eGFR's persistently <60 mL/min signify possible Chronic Kidney Disease.   . Anion gap 03/23/2016 8  5 - 15 Final    Assessment:  Emma Atkinson is a 50 y.o. female with clinical stage T2N1 (stage IIB) Her2/neu + right breast cancer s/p neoadjuvant chemotherapy and bilateral mastectomy.  She is currently receiving Herceptin every 3 weeks.  She presented with a minimally painful right breast mass with nipple inversion.  Bilateral mammogram and ultrasound on 08/12/2015 revealed a 4.1 cm right superior breast mass containing pleomorphic microcalcifications. There were several satellite lesions (1.3 cm, 0.9 cm, 0.6 cm) plus a 1.3 cm mass versus complicated cyst and a 0.8 cm cyst.  There was one 5 mm right axillary node with irregular thickened cortex.   Left breast revealed a 1.5 cm cyst containing intramural nodule versus debris at the 9:00 position 2 cm from the nipple. There was also an  indeterminate left breast 7 mm nodule at the 2:00 position 4 cm from the nipple.  Left breast biopsy at the 9:00 position on 08/17/2015 revealed intraductal papilloma with fibrocystic changes.  Core needle biopsy on 08/17/2015 of the right breast mass revealed a grade 3 invasive mammary carcinoma. Right axillary biopsy confirmed metastatic mammary carcinoma. Tumor was ER positive (1-10%), PR positive (11-50%) and HER-2/neu 3+  PET scan on 08/26/2015 revealed hypermetabolic right breast mass (SUV 11.3) with a hypermetabolic right axillary lymph node and a subtle focus of very faint hypermetabolic activity laterally in the right breast. There was faint hypermetabolic activity at the biopsy site of the left breast (? biopsy related).    CA27.29 was 19.6 on 08/22/2015, 24.7 on 01/10/2016, and 15.5 on 03/23/2016.  She received 4 cycles of AC (09/13/2015 - 11/08/2015) with Neulasta support.  She received 4 cycles of Herceptin and Perjeta (11/29/2015 - 01/31/2016) and 12 weeks of Taxol (11/29/2015 - 02/14/2016).  She tolerated her chemotherapy well.  She developed a grade I neuropathy.  She receives Herceptin every 3 weeks (last 02/24/2016)  Bilateral mastectomy on 03/08/2016 revealed no residual disease in the right breast.  Five lymph nodes were negative for malignancy.  Pathologic stage was ypT0 ypN0.  Left breast revealed benign intraductal papillomas and cysts adjacent to the biopsy site.  There was no atypia or malignancy.  She is s/p partial hysterectomy in 2001.  She was premenopausal ([place] 5 and estradiol 271.9) on 08/22/2015.  Echo on 08/26/2015 revealed an ejection fraction of 55-60%.  Echo on 11/25/2015 revealed an EF of 60-65%.  Echo on 02/23/2016 revealed an EF of 55-65%.  She has a family history of breast and ovarian cancer.  My Risk genetic testing revealed no mutation on 08/22/2015.  Symptomatically, she is recovering well from surgery.  She is on Levaquin for a sinus infection.  She has a  mild normocytic anemia.  Plan: 1.  Labs today:  CBC with diff, CMP, CA27.29. 2.  Herceptin today. 3.  Echo on 05/25/2016. 4.  Follow-up with Dr. Baruch Gouty regarding radiation. 5.  RTC in 3 weeks for labs (CBC with diff, ferritin) and Herceptin. 6.  RTC in 6 weeks for MD assessment, labs (CBC with diff, CMP) and Herceptin.   Lequita Asal, MD  03/23/2016

## 2016-05-04 NOTE — Progress Notes (Signed)
[facility] day:  05/04/2016   Chief Complaint: Emma Atkinson is a 50 y.o. female with clinical stage IIB Her2/neu+ right breast cancer who is seen for 6 week assessment and continuation of every 3 week Herceptin.  HPI:  The patient was last seen in the medical oncology clinic on 03/23/2016.  At that time, she was seen for initial assessment after bilateral mastectomy.  Right breast pathology had revealed a complete response to neoadjuvant chemotherapy.  Left breast was benign.  She has continued every 3 week Herceptin (03/23/2016 and 04/13/2016).  Symptomatically, she is doing well.  She notes that her legs bother her a little.  Discomfort is relieved by Aleve.  She still notes a little neuropathy in her fingertips.  She is scheduled to begin radiation on 05/07/2016.   Past Medical History  Diagnosis Date  . Breast cancer ([place])   . Anxiety   . Depression   . Anemia     H/O  . Shortness of breath dyspnea   . GERD (gastroesophageal reflux disease)   . Chronic kidney disease 12-2015    PYLONEPHRITIS  . Complication of anesthesia     HARD TO WAKE UP AFTER TONSILLECTOMY AS A CHILD BUT NO PROBLEMS SINCE  . Complication of anesthesia     PT STATES THAT BACK IN OR DURING PORT PLACMENT IN 2016 THAT SHE BECAME VEERY FLUSHED, LIGHT HEADED AND CRNA SAID ANTIBIOTIC MAY BE GOING IN IN TO FAST-RATE DECREASED AND PTS SYMPTOMS RESOLVED IMMEDIATELY    Past Surgical History  Procedure Laterality Date  . Breast biopsy Bilateral     08/17/2015   . Abdominal hysterectomy  2001  . Tonsillectomy    . Back surgery      FOR SCOLIOSIS-HERRINGTON RODS IN PLACE  . Portacath placement N/A 08/30/2015    Procedure: INSERTION PORT-A-CATH;  Surgeon: Leonie Green, MD;  Location: [facility] ORS;  Service: General;  Laterality: N/A;  . Simple mastectomy with axillary sentinel node biopsy Bilateral 03/08/2016    Procedure: SIMPLE MASTECTOMY;  Surgeon: Leonie Green, MD;  Location: [facility] ORS;  Service: General;  Laterality: Bilateral;  . Sentinel node biopsy Bilateral 03/08/2016    Procedure: SENTINEL NODE BIOPSY;  Surgeon: Leonie Green, MD;  Location: [facility] ORS;  Service: General;  Laterality: Bilateral;    Family History  Problem Relation Age of Onset  . Breast cancer Maternal Aunt 64  . Cancer Father   . Cancer Sister   . Cancer Maternal Grandfather     Social History:  reports that she quit smoking about 16 years ago. Her smoking use included Cigarettes. She has a 10 pack-year smoking history. She does not have any smokeless tobacco history on file. She reports that she drinks alcohol. She reports that she does not use illicit drugs.  The patient is alone today.  Allergies:  Allergies  Allergen Reactions  . Ceftin [Cefuroxime Axetil] Rash    Current Medications: Current Outpatient Prescriptions  Medication Sig Dispense Refill  . acetaminophen (TYLENOL) 500 MG tablet Take 1,000 mg by mouth every 6 (six) hours as needed.    . ALPRAZolam (XANAX) 0.25 MG tablet Take 1/4 tablet up to three times daily QHS    . Dexlansoprazole (DEXILANT) 30 MG capsule Take 30 mg by mouth as needed.     . diphenhydrAMINE (SOMINEX) 25 MG tablet Take 25 mg by mouth at bedtime. Reported on 01/12/2016    . lidocaine-prilocaine (EMLA) cream Apply 1  application topically as needed. Apply at least 1-2 hours before the port is to be used. 30 g 1  . Multiple Vitamin (MULTIVITAMIN) capsule Take 1 capsule by mouth daily.    Marland Kitchen HYDROcodone-acetaminophen (NORCO/VICODIN) 5-325 MG tablet Take 1-2 tablets by mouth every 4 (four) hours as needed for moderate pain. (Patient not taking: Reported on 03/23/2016) 16 tablet 0  . LORazepam (ATIVAN) 0.5 MG tablet Take 1 tablet (0.5 mg total) by mouth every 6 (six) hours as needed (Nausea or vomiting). (Patient not taking: Reported on 04/10/2016) 30 tablet 0   No current facility-administered medications for this visit.    Review of  Systems:  GENERAL:  Feels good.  No fevers or sweats.  Weight up 1 pound. PERFORMANCE STATUS (ECOG):  1 HEENT:  No visual changes, runny nose, sore throat, mouth sores or tenderness. Lungs: No shortness of breath or cough.  No pleuritic chest pain.  No hemoptysis. Cardiac:  No chest pain, palpitations, orthopnea, or PND. GI:  Appetite 75%.  Constipation on Miralax.  No nausea, vomiting, diarrhea, melena or hematochezia. GU:  No urgency, frequency,  or hematuria.  Dysuria.  Taking cipro for an apparent UTI. Musculoskeletal:  Back pain.  No joint pain.  No muscle tenderness. Extremities:  Leg pain noticeable at night.  No swelling. Skin:  No rashes or skin changes. Neuro:  Minimal numbness/tingling in tips of fingers.  No weakness, balance or coordination issues. Endocrine:  Mild hot flashes.  No diabetes, thyroid issues, or night sweats. Psych:  No mood changes. Pain:  No focal pain. Review of systems:  All other systems reviewed and found to be negative.  Physical Exam: Blood pressure 129/84, pulse 88, temperature 98.6 F (37 C), temperature source Tympanic, resp. rate 18, weight 110 lb 3.7 oz (50 kg). O2 sats 94% with ambulation. GENERAL:  Thin tan woman sitting comfortably in the exam room in no acute distress.  MENTAL STATUS:  Alert and oriented to person, place and time. HEAD:  Wearing a baseball cap.  Short blonde hair.  Normocephalic, atraumatic, face symmetric (thin), no Cushingoid features. EYES:  Blue eyes.  Pupils equal round and reactive to light and accomodation.  No conjunctivitis or scleral icterus. ENT:  Oropharynx clear without lesion.  Tongue normal. Mucous membranes moist.  RESPIRATORY:  Clear to auscultation without rales, wheezes or rhonchi. CARDIOVASCULAR:  Regular rate and rhythm without murmur, rub or gallop. No JVD. BREASTS:  s/p bilateral mastectomy.  Right mastectomy incision well healed.  Left mastectomy incision with tape medially.  No erythema or  nodularity. ABDOMEN:  Soft, non-tender, with active bowel sounds, and no hepatosplenomegaly.  No masses. SKIN:  Deep tan with mild erythema (sun changes).  No rashes, ulcers or lesions. EXTREMITIES: No edema, no skin discoloration or tenderness.  No palpable cords. LYMPH NODES: No palpable cervical, supraclavicular, axillary or inguinal adenopathy  NEUROLOGICAL: Unremarkable. PSYCH:  Appropriate.    Appointment on 05/04/2016  Component Date Value Ref Range Status  . WBC 05/04/2016 6.0  3.6 - 11.0 K/uL Final  . RBC 05/04/2016 3.98  3.80 - 5.20 MIL/uL Final  . Hemoglobin 05/04/2016 11.8* 12.0 - 16.0 g/dL Final  . HCT 05/04/2016 35.1  35.0 - 47.0 % Final  . MCV 05/04/2016 88.3  80.0 - 100.0 fL Final  . MCH 05/04/2016 29.8  26.0 - 34.0 pg Final  . MCHC 05/04/2016 33.7  32.0 - 36.0 g/dL Final  . RDW 05/04/2016 14.5  11.5 - 14.5 % Final  . Platelets 05/04/2016  311  150 - 440 K/uL Final  . Neutrophils Relative % 05/04/2016 73   Final  . Neutro Abs 05/04/2016 4.4  1.4 - 6.5 K/uL Final  . Lymphocytes Relative 05/04/2016 16   Final  . Lymphs Abs 05/04/2016 0.9* 1.0 - 3.6 K/uL Final  . Monocytes Relative 05/04/2016 6   Final  . Monocytes Absolute 05/04/2016 0.4  0.2 - 0.9 K/uL Final  . Eosinophils Relative 05/04/2016 5   Final  . Eosinophils Absolute 05/04/2016 0.3  0 - 0.7 K/uL Final  . Basophils Relative 05/04/2016 0   Final  . Basophils Absolute 05/04/2016 0.0  0 - 0.1 K/uL Final  . Sodium 05/04/2016 140  135 - 145 mmol/L Final  . Potassium 05/04/2016 3.8  3.5 - 5.1 mmol/L Final  . Chloride 05/04/2016 107  101 - 111 mmol/L Final  . CO2 05/04/2016 26  22 - 32 mmol/L Final  . Glucose, Bld 05/04/2016 103* 65 - 99 mg/dL Final  . BUN 05/04/2016 13  6 - 20 mg/dL Final  . Creatinine, Ser 05/04/2016 0.65  0.44 - 1.00 mg/dL Final  . Calcium 05/04/2016 9.3  8.9 - 10.3 mg/dL Final  . Total Protein 05/04/2016 7.1  6.5 - 8.1 g/dL Final  . Albumin 05/04/2016 4.2  3.5 - 5.0 g/dL Final  . AST  05/04/2016 24  15 - 41 U/L Final  . ALT 05/04/2016 18  14 - 54 U/L Final  . Alkaline Phosphatase 05/04/2016 67  38 - 126 U/L Final  . Total Bilirubin 05/04/2016 0.7  0.3 - 1.2 mg/dL Final  . GFR calc non Af Amer 05/04/2016 >60  >60 mL/min Final  . GFR calc Af Amer 05/04/2016 >60  >60 mL/min Final   Comment: (NOTE) The eGFR has been calculated using the CKD EPI equation. This calculation has not been validated in all clinical situations. eGFR's persistently <60 mL/min signify possible Chronic Kidney Disease.   . Anion gap 05/04/2016 7  5 - 15 Final    Assessment:  Emma Atkinson is a 50 y.o. female with clinical stage T2N1 (stage IIB) Her2/neu + right breast cancer s/p neoadjuvant chemotherapy and bilateral mastectomy.  She is currently receiving Herceptin every 3 weeks.  She presented with a minimally painful right breast mass with nipple inversion.  Bilateral mammogram and ultrasound on 08/12/2015 revealed a 4.1 cm right superior breast mass containing pleomorphic microcalcifications. There were several satellite lesions (1.3 cm, 0.9 cm, 0.6 cm) plus a 1.3 cm mass versus complicated cyst and a 0.8 cm cyst.  There was one 5 mm right axillary node with irregular thickened cortex.   Core needle biopsy on 08/17/2015 of the right breast mass revealed a grade 3 invasive mammary carcinoma. Right axillary biopsy confirmed metastatic mammary carcinoma. Tumor was ER positive (1-10%), PR positive (11-50%) and HER-2/neu 3+  PET scan on 08/26/2015 revealed hypermetabolic right breast mass (SUV 11.3) with a hypermetabolic right axillary lymph node and a subtle focus of very faint hypermetabolic activity laterally in the right breast. There was faint hypermetabolic activity at the biopsy site of the left breast (? biopsy related).    CA27.29 was 19.6 on 08/22/2015, 24.7 on 01/10/2016, and 15.5 on 03/23/2016.  She received 4 cycles of AC (09/13/2015 - 11/08/2015) with Neulasta support.  She received 4  cycles of Herceptin and Perjeta (11/29/2015 - 01/31/2016) and 12 weeks of Taxol (11/29/2015 - 02/14/2016).  She tolerated her chemotherapy well.  She developed a grade I neuropathy.  She receives Herceptin  every 3 weeks (last 04/13/2016)  Bilateral mastectomy on 03/08/2016 revealed no residual disease in the right breast.  Five lymph nodes were negative for malignancy.  Pathologic stage was ypT0 ypN0.  Left breast revealed benign intraductal papillomas and cysts adjacent to the biopsy site.  There was no atypia or malignancy.  She is s/p partial hysterectomy in 2001.  She was premenopausal ([place] 5 and estradiol 271.9) on 08/22/2015.  Echo on 08/26/2015 revealed an ejection fraction of 55-60%.  Echo on 11/25/2015 revealed an EF of 60-65%.  Echo on 02/23/2016 revealed an EF of 55-65%.  She has a family history of breast and ovarian cancer.  My Risk genetic testing revealed no mutation on 08/22/2015.  Symptomatically, she has some muscle aches at night.  She has a history of a mild normocytic anemia.  Hematocrit is 35.1.  Ferritin is 34.  Plan: 1.  Labs today:  CBC with diff, CMP, ferritin. 2.  Herceptin today. 3.  Echo on 05/25/2016. 4.  Begin radiation on 05/07/2016. 5.  Discuss avoiding excess sun. 6.  Discuss plans for hormonal therapy (tamoxifen) after completion of radiation. 7.  RTC in 3 weeks for Herceptin. 8.  RTC in 6 weeks for MD assessment, labs (CBC with diff, CMP) and Herceptin.   Lequita Asal, MD  05/04/2016, 2:17 PM

## 2016-05-04 NOTE — Progress Notes (Signed)
patient is here for follow up, mentions she has been taking Cipro since Wednesday she felt as if she was getting a UTI and had some on hand since she had a kidney infection a few months ago. She also mentions some leg pain at night. Aching in both legs mainly the right leg and it keeps her from sleeping at night.

## 2016-05-06 ENCOUNTER — Other Ambulatory Visit: Payer: Self-pay | Admitting: *Deleted

## 2016-05-06 DIAGNOSIS — C50111 Malignant neoplasm of central portion of right female breast: Secondary | ICD-10-CM

## 2016-05-07 ENCOUNTER — Ambulatory Visit
Admission: RE | Admit: 2016-05-07 | Discharge: 2016-05-07 | Disposition: A | Discharge status: Home / Self Care | Payer: 59 | Source: Ambulatory Visit | Attending: Radiation Oncology | Admitting: Radiation Oncology

## 2016-05-07 DIAGNOSIS — C50811 Malignant neoplasm of overlapping sites of right female breast: Secondary | ICD-10-CM | POA: Diagnosis not present

## 2016-05-07 DIAGNOSIS — C773 Secondary and unspecified malignant neoplasm of axilla and upper limb lymph nodes: Secondary | ICD-10-CM | POA: Diagnosis not present

## 2016-05-07 DIAGNOSIS — Z510 Encounter for antineoplastic radiation therapy: Secondary | ICD-10-CM | POA: Diagnosis not present

## 2016-05-07 DIAGNOSIS — Z170 Estrogen receptor positive status [ER+]: Secondary | ICD-10-CM | POA: Diagnosis not present

## 2016-05-08 ENCOUNTER — Ambulatory Visit: Payer: 59

## 2016-05-08 DIAGNOSIS — C773 Secondary and unspecified malignant neoplasm of axilla and upper limb lymph nodes: Secondary | ICD-10-CM | POA: Diagnosis not present

## 2016-05-08 DIAGNOSIS — Z170 Estrogen receptor positive status [ER+]: Secondary | ICD-10-CM | POA: Diagnosis not present

## 2016-05-08 DIAGNOSIS — Z510 Encounter for antineoplastic radiation therapy: Secondary | ICD-10-CM | POA: Diagnosis not present

## 2016-05-08 DIAGNOSIS — C50811 Malignant neoplasm of overlapping sites of right female breast: Secondary | ICD-10-CM | POA: Diagnosis not present

## 2016-05-09 ENCOUNTER — Ambulatory Visit: Payer: 59

## 2016-05-09 DIAGNOSIS — Z510 Encounter for antineoplastic radiation therapy: Secondary | ICD-10-CM | POA: Diagnosis not present

## 2016-05-09 DIAGNOSIS — Z170 Estrogen receptor positive status [ER+]: Secondary | ICD-10-CM | POA: Diagnosis not present

## 2016-05-09 DIAGNOSIS — C773 Secondary and unspecified malignant neoplasm of axilla and upper limb lymph nodes: Secondary | ICD-10-CM | POA: Diagnosis not present

## 2016-05-09 DIAGNOSIS — C50811 Malignant neoplasm of overlapping sites of right female breast: Secondary | ICD-10-CM | POA: Diagnosis not present

## 2016-05-10 ENCOUNTER — Ambulatory Visit: Payer: 59

## 2016-05-10 DIAGNOSIS — Z170 Estrogen receptor positive status [ER+]: Secondary | ICD-10-CM | POA: Diagnosis not present

## 2016-05-10 DIAGNOSIS — Z510 Encounter for antineoplastic radiation therapy: Secondary | ICD-10-CM | POA: Diagnosis not present

## 2016-05-10 DIAGNOSIS — C50811 Malignant neoplasm of overlapping sites of right female breast: Secondary | ICD-10-CM | POA: Diagnosis not present

## 2016-05-10 DIAGNOSIS — C773 Secondary and unspecified malignant neoplasm of axilla and upper limb lymph nodes: Secondary | ICD-10-CM | POA: Diagnosis not present

## 2016-05-11 ENCOUNTER — Ambulatory Visit: Payer: 59

## 2016-05-11 DIAGNOSIS — Z170 Estrogen receptor positive status [ER+]: Secondary | ICD-10-CM | POA: Diagnosis not present

## 2016-05-11 DIAGNOSIS — C50811 Malignant neoplasm of overlapping sites of right female breast: Secondary | ICD-10-CM | POA: Diagnosis not present

## 2016-05-11 DIAGNOSIS — Z510 Encounter for antineoplastic radiation therapy: Secondary | ICD-10-CM | POA: Diagnosis not present

## 2016-05-11 DIAGNOSIS — C773 Secondary and unspecified malignant neoplasm of axilla and upper limb lymph nodes: Secondary | ICD-10-CM | POA: Diagnosis not present

## 2016-05-14 ENCOUNTER — Ambulatory Visit: Payer: 59

## 2016-05-14 DIAGNOSIS — C50811 Malignant neoplasm of overlapping sites of right female breast: Secondary | ICD-10-CM | POA: Diagnosis not present

## 2016-05-14 DIAGNOSIS — C773 Secondary and unspecified malignant neoplasm of axilla and upper limb lymph nodes: Secondary | ICD-10-CM | POA: Diagnosis not present

## 2016-05-14 DIAGNOSIS — Z170 Estrogen receptor positive status [ER+]: Secondary | ICD-10-CM | POA: Diagnosis not present

## 2016-05-14 DIAGNOSIS — Z510 Encounter for antineoplastic radiation therapy: Secondary | ICD-10-CM | POA: Diagnosis not present

## 2016-05-15 ENCOUNTER — Ambulatory Visit: Payer: 59

## 2016-05-15 DIAGNOSIS — C50811 Malignant neoplasm of overlapping sites of right female breast: Secondary | ICD-10-CM | POA: Diagnosis not present

## 2016-05-15 DIAGNOSIS — Z170 Estrogen receptor positive status [ER+]: Secondary | ICD-10-CM | POA: Diagnosis not present

## 2016-05-15 DIAGNOSIS — Z510 Encounter for antineoplastic radiation therapy: Secondary | ICD-10-CM | POA: Diagnosis not present

## 2016-05-15 DIAGNOSIS — C773 Secondary and unspecified malignant neoplasm of axilla and upper limb lymph nodes: Secondary | ICD-10-CM | POA: Diagnosis not present

## 2016-05-16 ENCOUNTER — Ambulatory Visit: Payer: 59

## 2016-05-16 DIAGNOSIS — C773 Secondary and unspecified malignant neoplasm of axilla and upper limb lymph nodes: Secondary | ICD-10-CM | POA: Diagnosis not present

## 2016-05-16 DIAGNOSIS — C50811 Malignant neoplasm of overlapping sites of right female breast: Secondary | ICD-10-CM | POA: Diagnosis not present

## 2016-05-16 DIAGNOSIS — Z510 Encounter for antineoplastic radiation therapy: Secondary | ICD-10-CM | POA: Diagnosis not present

## 2016-05-16 DIAGNOSIS — Z170 Estrogen receptor positive status [ER+]: Secondary | ICD-10-CM | POA: Diagnosis not present

## 2016-05-17 ENCOUNTER — Ambulatory Visit: Payer: 59

## 2016-05-17 DIAGNOSIS — C50811 Malignant neoplasm of overlapping sites of right female breast: Secondary | ICD-10-CM | POA: Diagnosis not present

## 2016-05-17 DIAGNOSIS — Z170 Estrogen receptor positive status [ER+]: Secondary | ICD-10-CM | POA: Diagnosis not present

## 2016-05-17 DIAGNOSIS — Z510 Encounter for antineoplastic radiation therapy: Secondary | ICD-10-CM | POA: Diagnosis not present

## 2016-05-17 DIAGNOSIS — C773 Secondary and unspecified malignant neoplasm of axilla and upper limb lymph nodes: Secondary | ICD-10-CM | POA: Diagnosis not present

## 2016-05-18 ENCOUNTER — Ambulatory Visit: Payer: 59

## 2016-05-18 DIAGNOSIS — C773 Secondary and unspecified malignant neoplasm of axilla and upper limb lymph nodes: Secondary | ICD-10-CM | POA: Diagnosis not present

## 2016-05-18 DIAGNOSIS — Z510 Encounter for antineoplastic radiation therapy: Secondary | ICD-10-CM | POA: Diagnosis not present

## 2016-05-18 DIAGNOSIS — C50811 Malignant neoplasm of overlapping sites of right female breast: Secondary | ICD-10-CM | POA: Diagnosis not present

## 2016-05-18 DIAGNOSIS — Z170 Estrogen receptor positive status [ER+]: Secondary | ICD-10-CM | POA: Diagnosis not present

## 2016-05-20 ENCOUNTER — Ambulatory Visit: Payer: 59

## 2016-05-21 ENCOUNTER — Ambulatory Visit: Payer: 59

## 2016-05-21 ENCOUNTER — Inpatient Hospital Stay: Payer: 59 | Attending: Radiation Oncology

## 2016-05-21 DIAGNOSIS — R5383 Other fatigue: Secondary | ICD-10-CM | POA: Diagnosis not present

## 2016-05-21 DIAGNOSIS — N189 Chronic kidney disease, unspecified: Secondary | ICD-10-CM | POA: Diagnosis not present

## 2016-05-21 DIAGNOSIS — R0602 Shortness of breath: Secondary | ICD-10-CM | POA: Insufficient documentation

## 2016-05-21 DIAGNOSIS — K219 Gastro-esophageal reflux disease without esophagitis: Secondary | ICD-10-CM | POA: Insufficient documentation

## 2016-05-21 DIAGNOSIS — Z9013 Acquired absence of bilateral breasts and nipples: Secondary | ICD-10-CM | POA: Insufficient documentation

## 2016-05-21 DIAGNOSIS — Z79899 Other long term (current) drug therapy: Secondary | ICD-10-CM | POA: Insufficient documentation

## 2016-05-21 DIAGNOSIS — Z87891 Personal history of nicotine dependence: Secondary | ICD-10-CM | POA: Insufficient documentation

## 2016-05-21 DIAGNOSIS — C773 Secondary and unspecified malignant neoplasm of axilla and upper limb lymph nodes: Secondary | ICD-10-CM | POA: Diagnosis not present

## 2016-05-21 DIAGNOSIS — Z809 Family history of malignant neoplasm, unspecified: Secondary | ICD-10-CM | POA: Diagnosis not present

## 2016-05-21 DIAGNOSIS — D649 Anemia, unspecified: Secondary | ICD-10-CM | POA: Diagnosis not present

## 2016-05-21 DIAGNOSIS — G629 Polyneuropathy, unspecified: Secondary | ICD-10-CM | POA: Insufficient documentation

## 2016-05-21 DIAGNOSIS — I129 Hypertensive chronic kidney disease with stage 1 through stage 4 chronic kidney disease, or unspecified chronic kidney disease: Secondary | ICD-10-CM | POA: Insufficient documentation

## 2016-05-21 DIAGNOSIS — M791 Myalgia: Secondary | ICD-10-CM | POA: Diagnosis not present

## 2016-05-21 DIAGNOSIS — F329 Major depressive disorder, single episode, unspecified: Secondary | ICD-10-CM | POA: Insufficient documentation

## 2016-05-21 DIAGNOSIS — Z5112 Encounter for antineoplastic immunotherapy: Secondary | ICD-10-CM | POA: Insufficient documentation

## 2016-05-21 DIAGNOSIS — Z170 Estrogen receptor positive status [ER+]: Secondary | ICD-10-CM | POA: Diagnosis not present

## 2016-05-21 DIAGNOSIS — F419 Anxiety disorder, unspecified: Secondary | ICD-10-CM | POA: Insufficient documentation

## 2016-05-21 DIAGNOSIS — Z510 Encounter for antineoplastic radiation therapy: Secondary | ICD-10-CM | POA: Diagnosis not present

## 2016-05-21 DIAGNOSIS — Z803 Family history of malignant neoplasm of breast: Secondary | ICD-10-CM | POA: Insufficient documentation

## 2016-05-21 DIAGNOSIS — Z9221 Personal history of antineoplastic chemotherapy: Secondary | ICD-10-CM | POA: Insufficient documentation

## 2016-05-21 DIAGNOSIS — C50811 Malignant neoplasm of overlapping sites of right female breast: Secondary | ICD-10-CM | POA: Diagnosis not present

## 2016-05-21 LAB — CBC
HCT: 35.9 % (ref 35.0–47.0)
HEMOGLOBIN: 12.2 g/dL (ref 12.0–16.0)
MCH: 30.1 pg (ref 26.0–34.0)
MCHC: 33.9 g/dL (ref 32.0–36.0)
MCV: 88.8 fL (ref 80.0–100.0)
Platelets: 304 10*3/uL (ref 150–440)
RBC: 4.04 MIL/uL (ref 3.80–5.20)
RDW: 14.8 % — ABNORMAL HIGH (ref 11.5–14.5)
WBC: 5.3 10*3/uL (ref 3.6–11.0)

## 2016-05-22 ENCOUNTER — Ambulatory Visit: Payer: 59

## 2016-05-22 DIAGNOSIS — Z170 Estrogen receptor positive status [ER+]: Secondary | ICD-10-CM | POA: Diagnosis not present

## 2016-05-22 DIAGNOSIS — C773 Secondary and unspecified malignant neoplasm of axilla and upper limb lymph nodes: Secondary | ICD-10-CM | POA: Diagnosis not present

## 2016-05-22 DIAGNOSIS — Z510 Encounter for antineoplastic radiation therapy: Secondary | ICD-10-CM | POA: Diagnosis not present

## 2016-05-22 DIAGNOSIS — C50811 Malignant neoplasm of overlapping sites of right female breast: Secondary | ICD-10-CM | POA: Diagnosis not present

## 2016-05-23 ENCOUNTER — Ambulatory Visit: Payer: 59

## 2016-05-23 DIAGNOSIS — Z510 Encounter for antineoplastic radiation therapy: Secondary | ICD-10-CM | POA: Diagnosis not present

## 2016-05-23 DIAGNOSIS — Z170 Estrogen receptor positive status [ER+]: Secondary | ICD-10-CM | POA: Diagnosis not present

## 2016-05-23 DIAGNOSIS — C773 Secondary and unspecified malignant neoplasm of axilla and upper limb lymph nodes: Secondary | ICD-10-CM | POA: Diagnosis not present

## 2016-05-23 DIAGNOSIS — C50811 Malignant neoplasm of overlapping sites of right female breast: Secondary | ICD-10-CM | POA: Diagnosis not present

## 2016-05-24 ENCOUNTER — Ambulatory Visit: Payer: 59

## 2016-05-24 DIAGNOSIS — C773 Secondary and unspecified malignant neoplasm of axilla and upper limb lymph nodes: Secondary | ICD-10-CM | POA: Diagnosis not present

## 2016-05-24 DIAGNOSIS — Z510 Encounter for antineoplastic radiation therapy: Secondary | ICD-10-CM | POA: Diagnosis not present

## 2016-05-24 DIAGNOSIS — Z170 Estrogen receptor positive status [ER+]: Secondary | ICD-10-CM | POA: Diagnosis not present

## 2016-05-24 DIAGNOSIS — C50811 Malignant neoplasm of overlapping sites of right female breast: Secondary | ICD-10-CM | POA: Diagnosis not present

## 2016-05-25 ENCOUNTER — Ambulatory Visit: Payer: 59 | Admitting: Hematology and Oncology

## 2016-05-25 ENCOUNTER — Inpatient Hospital Stay: Payer: 59

## 2016-05-25 ENCOUNTER — Ambulatory Visit
Admission: RE | Admit: 2016-05-25 | Discharge: 2016-05-25 | Disposition: A | Discharge status: Home / Self Care | Payer: 59 | Source: Ambulatory Visit | Attending: Hematology and Oncology | Admitting: Hematology and Oncology

## 2016-05-25 ENCOUNTER — Other Ambulatory Visit: Payer: Self-pay | Admitting: Hematology and Oncology

## 2016-05-25 ENCOUNTER — Ambulatory Visit: Payer: 59

## 2016-05-25 ENCOUNTER — Other Ambulatory Visit: Payer: 59

## 2016-05-25 DIAGNOSIS — D649 Anemia, unspecified: Secondary | ICD-10-CM | POA: Insufficient documentation

## 2016-05-25 DIAGNOSIS — Z09 Encounter for follow-up examination after completed treatment for conditions other than malignant neoplasm: Secondary | ICD-10-CM | POA: Diagnosis present

## 2016-05-25 DIAGNOSIS — C50811 Malignant neoplasm of overlapping sites of right female breast: Secondary | ICD-10-CM

## 2016-05-25 DIAGNOSIS — F419 Anxiety disorder, unspecified: Secondary | ICD-10-CM | POA: Diagnosis not present

## 2016-05-25 DIAGNOSIS — I129 Hypertensive chronic kidney disease with stage 1 through stage 4 chronic kidney disease, or unspecified chronic kidney disease: Secondary | ICD-10-CM | POA: Diagnosis not present

## 2016-05-25 DIAGNOSIS — R0602 Shortness of breath: Secondary | ICD-10-CM | POA: Insufficient documentation

## 2016-05-25 DIAGNOSIS — K219 Gastro-esophageal reflux disease without esophagitis: Secondary | ICD-10-CM | POA: Diagnosis not present

## 2016-05-25 DIAGNOSIS — N189 Chronic kidney disease, unspecified: Secondary | ICD-10-CM | POA: Diagnosis not present

## 2016-05-25 DIAGNOSIS — G629 Polyneuropathy, unspecified: Secondary | ICD-10-CM | POA: Diagnosis not present

## 2016-05-25 DIAGNOSIS — C50111 Malignant neoplasm of central portion of right female breast: Secondary | ICD-10-CM

## 2016-05-25 DIAGNOSIS — Z5112 Encounter for antineoplastic immunotherapy: Secondary | ICD-10-CM | POA: Diagnosis not present

## 2016-05-25 DIAGNOSIS — Z510 Encounter for antineoplastic radiation therapy: Secondary | ICD-10-CM | POA: Diagnosis not present

## 2016-05-25 DIAGNOSIS — M791 Myalgia: Secondary | ICD-10-CM | POA: Diagnosis not present

## 2016-05-25 DIAGNOSIS — F329 Major depressive disorder, single episode, unspecified: Secondary | ICD-10-CM | POA: Insufficient documentation

## 2016-05-25 DIAGNOSIS — R5383 Other fatigue: Secondary | ICD-10-CM | POA: Diagnosis not present

## 2016-05-25 DIAGNOSIS — C773 Secondary and unspecified malignant neoplasm of axilla and upper limb lymph nodes: Secondary | ICD-10-CM | POA: Diagnosis not present

## 2016-05-25 DIAGNOSIS — Z170 Estrogen receptor positive status [ER+]: Secondary | ICD-10-CM | POA: Diagnosis not present

## 2016-05-25 MED ORDER — HEPARIN SOD (PORK) LOCK FLUSH 100 UNIT/ML IV SOLN
INTRAVENOUS | Status: AC
  Filled 2016-05-25: qty 5

## 2016-05-25 MED ORDER — ACETAMINOPHEN 325 MG PO TABS
650.0000 mg | ORAL_TABLET | Freq: Once | ORAL | Status: AC
  Administered 2016-05-25: 650 mg via ORAL
  Filled 2016-05-25: qty 2

## 2016-05-25 MED ORDER — SODIUM CHLORIDE 0.9 % IJ SOLN
10.0000 mL | Freq: Once | INTRAMUSCULAR | Status: AC
  Administered 2016-05-25: 10 mL via INTRAVENOUS
  Filled 2016-05-25: qty 10

## 2016-05-25 MED ORDER — SODIUM CHLORIDE 0.9 % IV SOLN
Freq: Once | INTRAVENOUS | Status: AC
  Administered 2016-05-25: 12:00:00 via INTRAVENOUS
  Filled 2016-05-25: qty 1000

## 2016-05-25 MED ORDER — DIPHENHYDRAMINE HCL 25 MG PO CAPS
50.0000 mg | ORAL_CAPSULE | Freq: Once | ORAL | Status: AC
  Administered 2016-05-25: 50 mg via ORAL
  Filled 2016-05-25: qty 2

## 2016-05-25 MED ORDER — HEPARIN SOD (PORK) LOCK FLUSH 100 UNIT/ML IV SOLN
500.0000 [IU] | Freq: Once | INTRAVENOUS | Status: AC | PRN
  Administered 2016-05-25: 500 [IU]

## 2016-05-25 MED ORDER — HEPARIN SOD (PORK) LOCK FLUSH 100 UNIT/ML IV SOLN
500.0000 [IU] | Freq: Once | INTRAVENOUS | Status: AC
  Administered 2016-05-25: 500 [IU] via INTRAVENOUS

## 2016-05-25 MED ORDER — TRASTUZUMAB CHEMO 150 MG IV SOLR
294.0000 mg | Freq: Once | INTRAVENOUS | Status: AC
  Administered 2016-05-25: 294 mg via INTRAVENOUS
  Filled 2016-05-25: qty 14

## 2016-05-25 NOTE — Progress Notes (Signed)
*  PRELIMINARY RESULTS* Echocardiogram 2D Echocardiogram has been performed.  Emma Atkinson 05/25/2016, 11:36 AM 

## 2016-05-28 ENCOUNTER — Ambulatory Visit: Payer: 59

## 2016-05-28 DIAGNOSIS — Z510 Encounter for antineoplastic radiation therapy: Secondary | ICD-10-CM | POA: Diagnosis not present

## 2016-05-28 DIAGNOSIS — C50811 Malignant neoplasm of overlapping sites of right female breast: Secondary | ICD-10-CM | POA: Diagnosis not present

## 2016-05-28 DIAGNOSIS — Z170 Estrogen receptor positive status [ER+]: Secondary | ICD-10-CM | POA: Diagnosis not present

## 2016-05-28 DIAGNOSIS — C773 Secondary and unspecified malignant neoplasm of axilla and upper limb lymph nodes: Secondary | ICD-10-CM | POA: Diagnosis not present

## 2016-05-29 ENCOUNTER — Ambulatory Visit: Payer: 59

## 2016-05-29 DIAGNOSIS — C773 Secondary and unspecified malignant neoplasm of axilla and upper limb lymph nodes: Secondary | ICD-10-CM | POA: Diagnosis not present

## 2016-05-29 DIAGNOSIS — C50811 Malignant neoplasm of overlapping sites of right female breast: Secondary | ICD-10-CM | POA: Diagnosis not present

## 2016-05-29 DIAGNOSIS — Z510 Encounter for antineoplastic radiation therapy: Secondary | ICD-10-CM | POA: Diagnosis not present

## 2016-05-29 DIAGNOSIS — Z170 Estrogen receptor positive status [ER+]: Secondary | ICD-10-CM | POA: Diagnosis not present

## 2016-05-30 ENCOUNTER — Ambulatory Visit: Payer: 59

## 2016-05-30 DIAGNOSIS — C50811 Malignant neoplasm of overlapping sites of right female breast: Secondary | ICD-10-CM | POA: Diagnosis not present

## 2016-05-30 DIAGNOSIS — Z170 Estrogen receptor positive status [ER+]: Secondary | ICD-10-CM | POA: Diagnosis not present

## 2016-05-30 DIAGNOSIS — Z510 Encounter for antineoplastic radiation therapy: Secondary | ICD-10-CM | POA: Diagnosis not present

## 2016-05-30 DIAGNOSIS — C773 Secondary and unspecified malignant neoplasm of axilla and upper limb lymph nodes: Secondary | ICD-10-CM | POA: Diagnosis not present

## 2016-05-31 ENCOUNTER — Ambulatory Visit: Payer: 59

## 2016-05-31 DIAGNOSIS — C773 Secondary and unspecified malignant neoplasm of axilla and upper limb lymph nodes: Secondary | ICD-10-CM | POA: Diagnosis not present

## 2016-05-31 DIAGNOSIS — Z510 Encounter for antineoplastic radiation therapy: Secondary | ICD-10-CM | POA: Diagnosis not present

## 2016-05-31 DIAGNOSIS — Z170 Estrogen receptor positive status [ER+]: Secondary | ICD-10-CM | POA: Diagnosis not present

## 2016-05-31 DIAGNOSIS — C50811 Malignant neoplasm of overlapping sites of right female breast: Secondary | ICD-10-CM | POA: Diagnosis not present

## 2016-06-01 ENCOUNTER — Ambulatory Visit: Payer: 59

## 2016-06-01 DIAGNOSIS — Z510 Encounter for antineoplastic radiation therapy: Secondary | ICD-10-CM | POA: Diagnosis not present

## 2016-06-01 DIAGNOSIS — C50811 Malignant neoplasm of overlapping sites of right female breast: Secondary | ICD-10-CM | POA: Diagnosis not present

## 2016-06-01 DIAGNOSIS — C773 Secondary and unspecified malignant neoplasm of axilla and upper limb lymph nodes: Secondary | ICD-10-CM | POA: Diagnosis not present

## 2016-06-01 DIAGNOSIS — Z170 Estrogen receptor positive status [ER+]: Secondary | ICD-10-CM | POA: Diagnosis not present

## 2016-06-04 ENCOUNTER — Ambulatory Visit: Payer: 59

## 2016-06-04 ENCOUNTER — Inpatient Hospital Stay: Payer: 59

## 2016-06-04 DIAGNOSIS — M791 Myalgia: Secondary | ICD-10-CM | POA: Diagnosis not present

## 2016-06-04 DIAGNOSIS — I129 Hypertensive chronic kidney disease with stage 1 through stage 4 chronic kidney disease, or unspecified chronic kidney disease: Secondary | ICD-10-CM | POA: Diagnosis not present

## 2016-06-04 DIAGNOSIS — G629 Polyneuropathy, unspecified: Secondary | ICD-10-CM | POA: Diagnosis not present

## 2016-06-04 DIAGNOSIS — R5383 Other fatigue: Secondary | ICD-10-CM | POA: Diagnosis not present

## 2016-06-04 DIAGNOSIS — C50811 Malignant neoplasm of overlapping sites of right female breast: Secondary | ICD-10-CM

## 2016-06-04 DIAGNOSIS — Z5112 Encounter for antineoplastic immunotherapy: Secondary | ICD-10-CM | POA: Diagnosis not present

## 2016-06-04 DIAGNOSIS — Z170 Estrogen receptor positive status [ER+]: Secondary | ICD-10-CM | POA: Diagnosis not present

## 2016-06-04 DIAGNOSIS — F419 Anxiety disorder, unspecified: Secondary | ICD-10-CM | POA: Diagnosis not present

## 2016-06-04 DIAGNOSIS — Z510 Encounter for antineoplastic radiation therapy: Secondary | ICD-10-CM | POA: Diagnosis not present

## 2016-06-04 DIAGNOSIS — C773 Secondary and unspecified malignant neoplasm of axilla and upper limb lymph nodes: Secondary | ICD-10-CM | POA: Diagnosis not present

## 2016-06-04 DIAGNOSIS — D649 Anemia, unspecified: Secondary | ICD-10-CM | POA: Diagnosis not present

## 2016-06-04 LAB — CBC
HEMATOCRIT: 36.7 % (ref 35.0–47.0)
HEMOGLOBIN: 12.3 g/dL (ref 12.0–16.0)
MCH: 29.8 pg (ref 26.0–34.0)
MCHC: 33.4 g/dL (ref 32.0–36.0)
MCV: 89.1 fL (ref 80.0–100.0)
Platelets: 269 10*3/uL (ref 150–440)
RBC: 4.12 MIL/uL (ref 3.80–5.20)
RDW: 14.6 % — AB (ref 11.5–14.5)
WBC: 4.6 10*3/uL (ref 3.6–11.0)

## 2016-06-05 ENCOUNTER — Ambulatory Visit: Payer: 59

## 2016-06-05 DIAGNOSIS — Z170 Estrogen receptor positive status [ER+]: Secondary | ICD-10-CM | POA: Diagnosis not present

## 2016-06-05 DIAGNOSIS — Z510 Encounter for antineoplastic radiation therapy: Secondary | ICD-10-CM | POA: Diagnosis not present

## 2016-06-05 DIAGNOSIS — C773 Secondary and unspecified malignant neoplasm of axilla and upper limb lymph nodes: Secondary | ICD-10-CM | POA: Diagnosis not present

## 2016-06-05 DIAGNOSIS — C50811 Malignant neoplasm of overlapping sites of right female breast: Secondary | ICD-10-CM | POA: Diagnosis not present

## 2016-06-06 ENCOUNTER — Ambulatory Visit: Payer: 59

## 2016-06-06 DIAGNOSIS — C50811 Malignant neoplasm of overlapping sites of right female breast: Secondary | ICD-10-CM | POA: Diagnosis not present

## 2016-06-06 DIAGNOSIS — Z510 Encounter for antineoplastic radiation therapy: Secondary | ICD-10-CM | POA: Diagnosis not present

## 2016-06-06 DIAGNOSIS — C773 Secondary and unspecified malignant neoplasm of axilla and upper limb lymph nodes: Secondary | ICD-10-CM | POA: Diagnosis not present

## 2016-06-06 DIAGNOSIS — Z170 Estrogen receptor positive status [ER+]: Secondary | ICD-10-CM | POA: Diagnosis not present

## 2016-06-07 ENCOUNTER — Ambulatory Visit: Payer: 59

## 2016-06-07 DIAGNOSIS — C50811 Malignant neoplasm of overlapping sites of right female breast: Secondary | ICD-10-CM | POA: Diagnosis not present

## 2016-06-07 DIAGNOSIS — Z510 Encounter for antineoplastic radiation therapy: Secondary | ICD-10-CM | POA: Diagnosis not present

## 2016-06-07 DIAGNOSIS — C773 Secondary and unspecified malignant neoplasm of axilla and upper limb lymph nodes: Secondary | ICD-10-CM | POA: Diagnosis not present

## 2016-06-07 DIAGNOSIS — Z170 Estrogen receptor positive status [ER+]: Secondary | ICD-10-CM | POA: Diagnosis not present

## 2016-06-08 ENCOUNTER — Ambulatory Visit: Payer: 59

## 2016-06-08 DIAGNOSIS — C50811 Malignant neoplasm of overlapping sites of right female breast: Secondary | ICD-10-CM | POA: Diagnosis not present

## 2016-06-08 DIAGNOSIS — C773 Secondary and unspecified malignant neoplasm of axilla and upper limb lymph nodes: Secondary | ICD-10-CM | POA: Diagnosis not present

## 2016-06-08 DIAGNOSIS — Z170 Estrogen receptor positive status [ER+]: Secondary | ICD-10-CM | POA: Diagnosis not present

## 2016-06-08 DIAGNOSIS — Z510 Encounter for antineoplastic radiation therapy: Secondary | ICD-10-CM | POA: Diagnosis not present

## 2016-06-11 ENCOUNTER — Ambulatory Visit: Payer: 59

## 2016-06-11 DIAGNOSIS — C50811 Malignant neoplasm of overlapping sites of right female breast: Secondary | ICD-10-CM | POA: Diagnosis not present

## 2016-06-11 DIAGNOSIS — Z510 Encounter for antineoplastic radiation therapy: Secondary | ICD-10-CM | POA: Diagnosis not present

## 2016-06-11 DIAGNOSIS — C773 Secondary and unspecified malignant neoplasm of axilla and upper limb lymph nodes: Secondary | ICD-10-CM | POA: Diagnosis not present

## 2016-06-11 DIAGNOSIS — Z170 Estrogen receptor positive status [ER+]: Secondary | ICD-10-CM | POA: Diagnosis not present

## 2016-06-12 ENCOUNTER — Ambulatory Visit: Payer: 59

## 2016-06-12 DIAGNOSIS — Z510 Encounter for antineoplastic radiation therapy: Secondary | ICD-10-CM | POA: Diagnosis not present

## 2016-06-12 DIAGNOSIS — Z170 Estrogen receptor positive status [ER+]: Secondary | ICD-10-CM | POA: Diagnosis not present

## 2016-06-12 DIAGNOSIS — C773 Secondary and unspecified malignant neoplasm of axilla and upper limb lymph nodes: Secondary | ICD-10-CM | POA: Diagnosis not present

## 2016-06-12 DIAGNOSIS — C50811 Malignant neoplasm of overlapping sites of right female breast: Secondary | ICD-10-CM | POA: Diagnosis not present

## 2016-06-13 ENCOUNTER — Ambulatory Visit: Payer: 59

## 2016-06-13 ENCOUNTER — Ambulatory Visit
Admission: RE | Admit: 2016-06-13 | Discharge: 2016-06-13 | Disposition: A | Discharge status: Home / Self Care | Payer: 59 | Source: Ambulatory Visit | Attending: Radiation Oncology | Admitting: Radiation Oncology

## 2016-06-13 DIAGNOSIS — Z510 Encounter for antineoplastic radiation therapy: Secondary | ICD-10-CM | POA: Diagnosis not present

## 2016-06-13 DIAGNOSIS — C773 Secondary and unspecified malignant neoplasm of axilla and upper limb lymph nodes: Secondary | ICD-10-CM | POA: Diagnosis not present

## 2016-06-13 DIAGNOSIS — Z170 Estrogen receptor positive status [ER+]: Secondary | ICD-10-CM | POA: Diagnosis not present

## 2016-06-13 DIAGNOSIS — C50811 Malignant neoplasm of overlapping sites of right female breast: Secondary | ICD-10-CM | POA: Diagnosis not present

## 2016-06-14 ENCOUNTER — Inpatient Hospital Stay: Payer: 59

## 2016-06-14 ENCOUNTER — Inpatient Hospital Stay (HOSPITAL_BASED_OUTPATIENT_CLINIC_OR_DEPARTMENT_OTHER): Payer: 59 | Admitting: Hematology and Oncology

## 2016-06-14 ENCOUNTER — Other Ambulatory Visit: Payer: Self-pay | Admitting: Hematology and Oncology

## 2016-06-14 ENCOUNTER — Ambulatory Visit
Admission: RE | Admit: 2016-06-14 | Discharge: 2016-06-14 | Disposition: A | Discharge status: Home / Self Care | Payer: 59 | Source: Ambulatory Visit | Attending: Radiation Oncology | Admitting: Radiation Oncology

## 2016-06-14 ENCOUNTER — Encounter: Payer: Self-pay | Admitting: Hematology and Oncology

## 2016-06-14 VITALS — BP 162/77 | HR 86 | Temp 96.5°F | Resp 18 | Wt 110.6 lb

## 2016-06-14 DIAGNOSIS — Z510 Encounter for antineoplastic radiation therapy: Secondary | ICD-10-CM | POA: Diagnosis not present

## 2016-06-14 DIAGNOSIS — D649 Anemia, unspecified: Secondary | ICD-10-CM

## 2016-06-14 DIAGNOSIS — C50811 Malignant neoplasm of overlapping sites of right female breast: Secondary | ICD-10-CM

## 2016-06-14 DIAGNOSIS — R0602 Shortness of breath: Secondary | ICD-10-CM

## 2016-06-14 DIAGNOSIS — C50111 Malignant neoplasm of central portion of right female breast: Secondary | ICD-10-CM

## 2016-06-14 DIAGNOSIS — Z5112 Encounter for antineoplastic immunotherapy: Secondary | ICD-10-CM | POA: Diagnosis not present

## 2016-06-14 DIAGNOSIS — M791 Myalgia: Secondary | ICD-10-CM | POA: Diagnosis not present

## 2016-06-14 DIAGNOSIS — Z9013 Acquired absence of bilateral breasts and nipples: Secondary | ICD-10-CM

## 2016-06-14 DIAGNOSIS — K219 Gastro-esophageal reflux disease without esophagitis: Secondary | ICD-10-CM

## 2016-06-14 DIAGNOSIS — I129 Hypertensive chronic kidney disease with stage 1 through stage 4 chronic kidney disease, or unspecified chronic kidney disease: Secondary | ICD-10-CM

## 2016-06-14 DIAGNOSIS — F329 Major depressive disorder, single episode, unspecified: Secondary | ICD-10-CM

## 2016-06-14 DIAGNOSIS — Z9221 Personal history of antineoplastic chemotherapy: Secondary | ICD-10-CM

## 2016-06-14 DIAGNOSIS — Z87891 Personal history of nicotine dependence: Secondary | ICD-10-CM

## 2016-06-14 DIAGNOSIS — R5383 Other fatigue: Secondary | ICD-10-CM | POA: Diagnosis not present

## 2016-06-14 DIAGNOSIS — Z79899 Other long term (current) drug therapy: Secondary | ICD-10-CM

## 2016-06-14 DIAGNOSIS — Z803 Family history of malignant neoplasm of breast: Secondary | ICD-10-CM

## 2016-06-14 DIAGNOSIS — G629 Polyneuropathy, unspecified: Secondary | ICD-10-CM | POA: Diagnosis not present

## 2016-06-14 DIAGNOSIS — Z809 Family history of malignant neoplasm, unspecified: Secondary | ICD-10-CM

## 2016-06-14 DIAGNOSIS — F419 Anxiety disorder, unspecified: Secondary | ICD-10-CM

## 2016-06-14 DIAGNOSIS — Z170 Estrogen receptor positive status [ER+]: Secondary | ICD-10-CM

## 2016-06-14 DIAGNOSIS — N189 Chronic kidney disease, unspecified: Secondary | ICD-10-CM

## 2016-06-14 DIAGNOSIS — C773 Secondary and unspecified malignant neoplasm of axilla and upper limb lymph nodes: Secondary | ICD-10-CM | POA: Diagnosis not present

## 2016-06-14 LAB — CBC WITH DIFFERENTIAL/PLATELET
Basophils Absolute: 0.2 10*3/uL — ABNORMAL HIGH (ref 0–0.1)
Basophils Relative: 3 %
Eosinophils Absolute: 0.1 10*3/uL (ref 0–0.7)
Eosinophils Relative: 2 %
HCT: 37.4 % (ref 35.0–47.0)
Hemoglobin: 12.8 g/dL (ref 12.0–16.0)
Lymphocytes Relative: 5 %
Lymphs Abs: 0.3 10*3/uL — ABNORMAL LOW (ref 1.0–3.6)
MCH: 29.8 pg (ref 26.0–34.0)
MCHC: 34.1 g/dL (ref 32.0–36.0)
MCV: 87.4 fL (ref 80.0–100.0)
Monocytes Absolute: 0.4 10*3/uL (ref 0.2–0.9)
Monocytes Relative: 7 %
Neutro Abs: 5 10*3/uL (ref 1.4–6.5)
Neutrophils Relative %: 83 %
Platelets: 309 10*3/uL (ref 150–440)
RBC: 4.28 MIL/uL (ref 3.80–5.20)
RDW: 14.7 % — ABNORMAL HIGH (ref 11.5–14.5)
WBC: 6 10*3/uL (ref 3.6–11.0)

## 2016-06-14 LAB — COMPREHENSIVE METABOLIC PANEL
ALT: 15 U/L (ref 14–54)
AST: 19 U/L (ref 15–41)
Albumin: 4.5 g/dL (ref 3.5–5.0)
Alkaline Phosphatase: 67 U/L (ref 38–126)
Anion gap: 11 (ref 5–15)
BUN: 11 mg/dL (ref 6–20)
CO2: 25 mmol/L (ref 22–32)
Calcium: 9.3 mg/dL (ref 8.9–10.3)
Chloride: 102 mmol/L (ref 101–111)
Creatinine, Ser: 0.64 mg/dL (ref 0.44–1.00)
GFR calc Af Amer: >60 mL/min (ref >60)
GFR calc non Af Amer: >60 mL/min (ref >60)
Glucose, Bld: 95 mg/dL (ref 65–99)
Potassium: 4 mmol/L (ref 3.5–5.1)
Sodium: 138 mmol/L (ref 135–145)
Total Bilirubin: 0.9 mg/dL (ref 0.3–1.2)
Total Protein: 7.4 g/dL (ref 6.5–8.1)

## 2016-06-14 LAB — MAGNESIUM: Magnesium: 2 mg/dL (ref 1.7–2.4)

## 2016-06-14 MED ORDER — ACETAMINOPHEN 325 MG PO TABS
650.0000 mg | ORAL_TABLET | Freq: Once | ORAL | Status: AC
  Administered 2016-06-14: 650 mg via ORAL
  Filled 2016-06-14: qty 2

## 2016-06-14 MED ORDER — DIPHENHYDRAMINE HCL 25 MG PO CAPS
25.0000 mg | ORAL_CAPSULE | Freq: Once | ORAL | Status: AC
  Administered 2016-06-14: 25 mg via ORAL
  Filled 2016-06-14: qty 1

## 2016-06-14 MED ORDER — HEPARIN SOD (PORK) LOCK FLUSH 100 UNIT/ML IV SOLN
500.0000 [IU] | Freq: Once | INTRAVENOUS | Status: AC | PRN
Start: 2016-06-14 — End: 2016-06-14
  Administered 2016-06-14: 500 [IU]
  Filled 2016-06-14 (×2): qty 5

## 2016-06-14 MED ORDER — SODIUM CHLORIDE 0.9 % IV SOLN
Freq: Once | INTRAVENOUS | Status: AC
  Administered 2016-06-14: 15:00:00 via INTRAVENOUS
  Filled 2016-06-14: qty 1000

## 2016-06-14 MED ORDER — TRASTUZUMAB CHEMO 150 MG IV SOLR
294.0000 mg | Freq: Once | INTRAVENOUS | Status: AC
  Administered 2016-06-14: 294 mg via INTRAVENOUS
  Filled 2016-06-14: qty 14

## 2016-06-14 NOTE — Progress Notes (Signed)
Patient is here for follow up, she is with her husband. She has questions about reconstructive surgery  With each treatment does she need to continue the benadryl.

## 2016-06-14 NOTE — Progress Notes (Signed)
[facility] day:  06/14/16   Chief Complaint: Emma Atkinson is a 50 y.o. female with clinical stage IIB Her2/neu+ right breast cancer who is seen for 6 week assessment and continuation of every 3 week Herceptin.  HPI:  The patient was last seen in the medical oncology clinic on 05/04/2016.  At that time, she was doing well.  She had some muscle aches at night.  She continued very 3 week Herceptin.  She was to begin radiation on 05/07/2016.  Echo on 05/25/2016 revealed an EF of 55-60%.  During the interim, she has done well.  She has continued with radiation.  Notes fatigue associated with the radiation. She also notes some minor skin changes.  Last radiation is scheduled for 06/25/2016.  She is scheduled for a consultation with Dr Patrice Paradise of plastic surgery on 06/28/2016.   Past Medical History:  Diagnosis Date  . Anemia    H/O  . Anxiety   . Breast cancer ([place])   . Chronic kidney disease 12-2015   PYLONEPHRITIS  . Complication of anesthesia    HARD TO WAKE UP AFTER TONSILLECTOMY AS A CHILD BUT NO PROBLEMS SINCE  . Complication of anesthesia    PT STATES THAT BACK IN OR DURING PORT PLACMENT IN 2016 THAT SHE BECAME VEERY FLUSHED, LIGHT HEADED AND CRNA SAID ANTIBIOTIC MAY BE GOING IN IN TO FAST-RATE DECREASED AND PTS SYMPTOMS RESOLVED IMMEDIATELY  . Depression   . GERD (gastroesophageal reflux disease)   . Shortness of breath dyspnea     Past Surgical History:  Procedure Laterality Date  . ABDOMINAL HYSTERECTOMY  2001  . BACK SURGERY     FOR SCOLIOSIS-HERRINGTON RODS IN PLACE  . BREAST BIOPSY Bilateral    08/17/2015   . PORTACATH PLACEMENT N/A 08/30/2015   Procedure: INSERTION PORT-A-CATH;  Surgeon: Leonie Green, MD;  Location: [facility] ORS;  Service: General;  Laterality: N/A;  . SENTINEL NODE BIOPSY Bilateral 03/08/2016   Procedure: SENTINEL NODE BIOPSY;  Surgeon: Leonie Green, MD;  Location: [facility] ORS;  Service: General;   Laterality: Bilateral;  . SIMPLE MASTECTOMY WITH AXILLARY SENTINEL NODE BIOPSY Bilateral 03/08/2016   Procedure: SIMPLE MASTECTOMY;  Surgeon: Leonie Green, MD;  Location: [facility] ORS;  Service: General;  Laterality: Bilateral;  . TONSILLECTOMY      Family History  Problem Relation Age of Onset  . Breast cancer Maternal Aunt 64  . Cancer Father   . Cancer Sister   . Cancer Maternal Grandfather     Social History:  reports that she quit smoking about 16 years ago. Her smoking use included Cigarettes. She has a 10.00 pack-year smoking history. She does not have any smokeless tobacco history on file. She reports that she drinks alcohol. She reports that she does not use drugs.  She is currently on long term disability.  The patient is accompanied by her husband today.  Allergies:  Allergies  Allergen Reactions  . Ceftin [Cefuroxime Axetil] Rash    Current Medications: Current Outpatient Prescriptions  Medication Sig Dispense Refill  . acetaminophen (TYLENOL) 500 MG tablet Take 1,000 mg by mouth every 6 (six) hours as needed.    . ALPRAZolam (XANAX) 0.25 MG tablet Take 1/4 tablet up to three times daily QHS    . Dexlansoprazole (DEXILANT) 30 MG capsule Take 30 mg by mouth as needed.     . diphenhydrAMINE (SOMINEX) 25 MG tablet Take 25 mg by mouth at bedtime. Reported on 01/12/2016    .  lidocaine-prilocaine (EMLA) cream Apply 1 application topically as needed. Apply at least 1-2 hours before the port is to be used. 30 g 1  . Multiple Vitamin (MULTIVITAMIN) capsule Take 1 capsule by mouth daily.    Marland Kitchen HYDROcodone-acetaminophen (NORCO/VICODIN) 5-325 MG tablet Take 1-2 tablets by mouth every 4 (four) hours as needed for moderate pain. (Patient not taking: Reported on 03/23/2016) 16 tablet 0  . LORazepam (ATIVAN) 0.5 MG tablet Take 1 tablet (0.5 mg total) by mouth every 6 (six) hours as needed (Nausea or vomiting). (Patient not taking: Reported on 04/10/2016) 30 tablet 0   No current  facility-administered medications for this visit.     Review of Systems:  GENERAL:  Fatigue associated with radiation.  No fevers or sweats.  Weight stable. PERFORMANCE STATUS (ECOG):  1 HEENT:  No visual changes, runny nose, sore throat, mouth sores or tenderness. Lungs: No shortness of breath or cough.  No pleuritic chest pain.  No hemoptysis. Cardiac:  No chest pain, palpitations, orthopnea, or PND. GI:  Appetite 75%.  Constipation on Miralax.  No nausea, vomiting, diarrhea, melena or hematochezia. GU:  No urgency, frequency,  or hematuria.  Dysuria.  Taking cipro for an apparent UTI. Musculoskeletal:  Back pain.  No joint pain.  No muscle tenderness. Extremities:  Leg pain noticeable at night.  No swelling. Skin:  No rashes or skin changes. Neuro:  Minimal numbness/tingling in tips of fingers.  No weakness, balance or coordination issues. Endocrine:  Mild hot flashes.  No diabetes, thyroid issues, or night sweats. Psych:  No mood changes. Pain:  No focal pain. Review of systems:  All other systems reviewed and found to be negative.  Physical Exam: Blood pressure (!) 162/77, pulse 86, temperature (!) 96.5 F (35.8 C), temperature source Tympanic, resp. rate 18, weight 110 lb 9 oz (50.1 kg). O2 sats 94% with ambulation. GENERAL:  Thin tan woman sitting comfortably in the exam room in no acute distress.  MENTAL STATUS:  Alert and oriented to person, place and time. HEAD:  Short graying hair.  Normocephalic, atraumatic, face symmetric (thin), no Cushingoid features. EYES:  Blue eyes.  Pupils equal round and reactive to light and accomodation.  No conjunctivitis or scleral icterus. ENT:  Oropharynx clear without lesion.  Tongue normal. Mucous membranes moist.  RESPIRATORY:  Clear to auscultation without rales, wheezes or rhonchi. CARDIOVASCULAR:  Regular rate and rhythm without murmur, rub or gallop. No JVD. BREASTS:  s/p bilateral mastectomy. Well healed bilateral mastectomy incisions.   No erythema or nodularity. ABDOMEN:  Soft, non-tender, with active bowel sounds, and no hepatosplenomegaly.  No masses. SKIN:  Deep tan.  Right sided posterior chest wall hyperpigmentation.  No rashes, ulcers or lesions. EXTREMITIES: No edema, no skin discoloration or tenderness.  No palpable cords. LYMPH NODES: No palpable cervical, supraclavicular, axillary or inguinal adenopathy  NEUROLOGICAL: Unremarkable. PSYCH:  Appropriate.    Appointment on 06/14/2016  Component Date Value Ref Range Status  . WBC 06/14/2016 6.0  3.6 - 11.0 K/uL Final  . RBC 06/14/2016 4.28  3.80 - 5.20 MIL/uL Final  . Hemoglobin 06/14/2016 12.8  12.0 - 16.0 g/dL Final  . HCT 06/14/2016 37.4  35.0 - 47.0 % Final  . MCV 06/14/2016 87.4  80.0 - 100.0 fL Final  . MCH 06/14/2016 29.8  26.0 - 34.0 pg Final  . MCHC 06/14/2016 34.1  32.0 - 36.0 g/dL Final  . RDW 06/14/2016 14.7* 11.5 - 14.5 % Final  . Platelets 06/14/2016 309  150 -  440 K/uL Final  . Neutrophils Relative % 06/14/2016 83  % Final  . Neutro Abs 06/14/2016 5.0  1.4 - 6.5 K/uL Final  . Lymphocytes Relative 06/14/2016 5  % Final  . Lymphs Abs 06/14/2016 0.3* 1.0 - 3.6 K/uL Final  . Monocytes Relative 06/14/2016 7  % Final  . Monocytes Absolute 06/14/2016 0.4  0.2 - 0.9 K/uL Final  . Eosinophils Relative 06/14/2016 2  % Final  . Eosinophils Absolute 06/14/2016 0.1  0 - 0.7 K/uL Final  . Basophils Relative 06/14/2016 3  % Final  . Basophils Absolute 06/14/2016 0.2* 0 - 0.1 K/uL Final  . Sodium 06/14/2016 138  135 - 145 mmol/L Final  . Potassium 06/14/2016 4.0  3.5 - 5.1 mmol/L Final  . Chloride 06/14/2016 102  101 - 111 mmol/L Final  . CO2 06/14/2016 25  22 - 32 mmol/L Final  . Glucose, Bld 06/14/2016 95  65 - 99 mg/dL Final  . BUN 06/14/2016 11  6 - 20 mg/dL Final  . Creatinine, Ser 06/14/2016 0.64  0.44 - 1.00 mg/dL Final  . Calcium 06/14/2016 9.3  8.9 - 10.3 mg/dL Final  . Total Protein 06/14/2016 7.4  6.5 - 8.1 g/dL Final  . Albumin 06/14/2016 4.5   3.5 - 5.0 g/dL Final  . AST 06/14/2016 19  15 - 41 U/L Final  . ALT 06/14/2016 15  14 - 54 U/L Final  . Alkaline Phosphatase 06/14/2016 67  38 - 126 U/L Final  . Total Bilirubin 06/14/2016 0.9  0.3 - 1.2 mg/dL Final  . GFR calc non Af Amer 06/14/2016 >60  >60 mL/min Final  . GFR calc Af Amer 06/14/2016 >60  >60 mL/min Final   Comment: (NOTE) The eGFR has been calculated using the CKD EPI equation. This calculation has not been validated in all clinical situations. eGFR's persistently <60 mL/min signify possible Chronic Kidney Disease.   . Anion gap 06/14/2016 11  5 - 15 Final  . Magnesium 06/14/2016 2.0  1.7 - 2.4 mg/dL Final    Assessment:  LESLI ISSA is a 50 y.o. female with clinical stage T2N1 (stage IIB) Her2/neu + right breast cancer s/p neoadjuvant chemotherapy and bilateral mastectomy.  She is currently receiving Herceptin every 3 weeks.  She presented with a minimally painful right breast mass with nipple inversion.  Bilateral mammogram and ultrasound on 08/12/2015 revealed a 4.1 cm right superior breast mass containing pleomorphic microcalcifications. There were several satellite lesions (1.3 cm, 0.9 cm, 0.6 cm) plus a 1.3 cm mass versus complicated cyst and a 0.8 cm cyst.  There was one 5 mm right axillary node with irregular thickened cortex.   Core needle biopsy on 08/17/2015 of the right breast mass revealed a grade 3 invasive mammary carcinoma. Right axillary biopsy confirmed metastatic mammary carcinoma. Tumor was ER positive (1-10%), PR positive (11-50%) and HER-2/neu 3+  PET scan on 08/26/2015 revealed hypermetabolic right breast mass (SUV 11.3) with a hypermetabolic right axillary lymph node and a subtle focus of very faint hypermetabolic activity laterally in the right breast. There was faint hypermetabolic activity at the biopsy site of the left breast (? biopsy related).    CA27.29 was 19.6 on 08/22/2015, 24.7 on 01/10/2016, and 15.5 on 03/23/2016.  She received 4  cycles of AC (09/13/2015 - 11/08/2015) with Neulasta support.  She received 4 cycles of Herceptin and Perjeta (11/29/2015 - 01/31/2016) and 12 weeks of Taxol (11/29/2015 - 02/14/2016).  She tolerated her chemotherapy well.  She developed a  grade I neuropathy.  She receives Herceptin every 3 weeks (last 04/13/2016)  Bilateral mastectomy on 03/08/2016 revealed no residual disease in the right breast.  Five lymph nodes were negative for malignancy.  Pathologic stage was ypT0 ypN0.  Left breast revealed benign intraductal papillomas and cysts adjacent to the biopsy site.  There was no atypia or malignancy.  She is s/p partial hysterectomy (ovaries remain) in 2001.  She was premenopausal ([place] 5 and estradiol 271.9) on 08/22/2015.  Echo on 08/26/2015 revealed an ejection fraction of 55-60%.  Echo on 11/25/2015 revealed an EF of 60-65%.  Echo on 02/23/2016 and 05/25/2016 revealed an EF of 55-65%.  She has a family history of breast and ovarian cancer.  My Risk genetic testing revealed no mutation on 08/22/2015.  Symptomatically, she feels fatigued.  Hematocrit is 37.4.   Plan: 1.  Labs today:  CBC with diff, CMP, Mg, FSH, and estradiol. 2.  Review interval echo. 3.  Herceptin today.  Decrease Benadryl pre-med to 25 mg.   4.  Continue radiation. 5.  Follow-up with plastic surgery. 6.  Discuss plans for hormonal therapy (tamoxifen) after completion of radiation. 7.  RTC in 3 weeks for MD assessment, labs (CBC with diff, CMP), Herceptin, and initiation of hormonal therapy.   Lequita Asal, MD  06/14/2016, 2:24 PM

## 2016-06-14 NOTE — Patient Instructions (Signed)
Tamoxifen oral solution  What is this medicine?  TAMOXIFEN (ta MOX i fen) blocks the effects of estrogen. It is commonly used to treat breast cancer. It is also used to decrease the chance of breast cancer coming back in women who have received treatment for the disease. It may also help prevent breast cancer in women who have a high risk of developing breast cancer.  This medicine may be used for other purposes; ask your health care provider or pharmacist if you have questions.  What should I tell my health care provider before I take this medicine?  They need to know if you have any of these conditions:  -blood clots  -blood disease  -cataracts or impaired eyesight  -endometriosis  -high calcium levels  -high cholesterol  -irregular menstrual cycles  -liver disease  -stroke  -uterine fibroids  -an unusual or allergic reaction to tamoxifen, other medicines, foods, dyes, or preservatives  -pregnant or trying to get pregnant  -breast-feeding  How should I use this medicine?  Take this medicine by mouth with a glass of water. Follow the directions on the prescription label. You can take it with or without food. Take your medicine at regular intervals. Do not take your medicine more often than directed. Do not stop taking except on your doctor's advice.  A special MedGuide will be given to you by the pharmacist with each prescription and refill. Be sure to read this information carefully each time.  Talk to your pediatrician regarding the use of this medicine in children. While this drug may be prescribed for selected conditions, precautions do apply.  Overdosage: If you think you have taken too much of this medicine contact a poison control center or emergency room at once.  NOTE: This medicine is only for you. Do not share this medicine with others.  What if I miss a dose?  If you miss a dose, take it as soon as you can. If it is almost time for your next dose, take only that dose. Do not take double or extra  doses.  What may interact with this medicine?  -aminoglutethimide  -bromocriptine  -chemotherapy drugs  -female hormones, like estrogens and birth control pills  -letrozole  -medroxyprogesterone  -phenobarbital  -rifampin  -warfarin  This list may not describe all possible interactions. Give your health care provider a list of all the medicines, herbs, non-prescription drugs, or dietary supplements you use. Also tell them if you smoke, drink alcohol, or use illegal drugs. Some items may interact with your medicine.  What should I watch for while using this medicine?  Visit your doctor or health care professional for regular checks on your progress. You will need regular pelvic exams, breast exams, and mammograms. If you are taking this medicine to reduce your risk of getting breast cancer, you should know that this medicine does not prevent all types of breast cancer. If breast cancer or other problems occur, there is no guarantee that it will be found at an early stage.  Do not become pregnant while taking this medicine or for 2 months after stopping this medicine. Stop taking this medicine if you get pregnant or think you are pregnant and contact your doctor. This medicine may harm your unborn baby. Women who can possibly become pregnant should use birth control methods that do not use hormones during tamoxifen treatment and for 2 months after therapy has stopped. Talk with your health care provider for birth control advice.  Do not breast feed   the face, lips, or tongue -breathing problems -changes in vision -changes in your menstrual cycle -difficulty walking or talking -new breast lumps -numbness -pelvic pain or pressure -redness, blistering, peeling  or loosening of the skin, including inside the mouth -sudden chest pain -swelling, pain or tenderness in your calf or leg -unusual bruising or bleeding -vaginal discharge that is bloody, brown, or rust -weakness -yellowing of the whites of the eyes or skin Side effects that usually do not require medical attention (report to your doctor or health care professional if they continue or are bothersome): -fatigue -hair loss, although uncommon and is usually mild -headache -hot flashes -impotence (in men) -nausea, vomiting (mild) -vaginal discharge (white or clear) This list may not describe all possible side effects. Call your doctor for medical advice about side effects. You may report side effects to FDA at 1-800-FDA-1088. Where should I keep my medicine? Keep out of the reach of children. Store in the original package at room temperature between 20 and 25 degrees C (68 and 77 degrees F). Do not store above 25 degrees C (77 degrees F). DO NOT freeze or refrigerate. Protect from light. Keep container tightly closed. Use within 3 months of opening. Throw away any unused medicine after the expiration date. NOTE: This sheet is a summary. It may not cover all possible information. If you have questions about this medicine, talk to your doctor, pharmacist, or health care provider.    2016, Elsevier/Gold Standard. (2008-06-28 15:48:08) Letrozole tablets What is this medicine? LETROZOLE (LET roe zole) blocks the production of estrogen. Certain types of breast cancer grow under the influence of estrogen. Letrozole helps block tumor growth. This medicine is used to treat advanced breast cancer in postmenopausal women. This medicine may be used for other purposes; ask your health care provider or pharmacist if you have questions. What should I tell my health care provider before I take this medicine? They need to know if you have any of these conditions: -liver disease -osteoporosis (weak  bones) -an unusual or allergic reaction to letrozole, other medicines, foods, dyes, or preservatives -pregnant or trying to get pregnant -breast-feeding How should I use this medicine? Take this medicine by mouth with a glass of water. You may take it with or without food. Follow the directions on the prescription label. Take your medicine at regular intervals. Do not take your medicine more often than directed. Do not stop taking except on your doctor's advice. Talk to your pediatrician regarding the use of this medicine in children. Special care may be needed. Overdosage: If you think you have taken too much of this medicine contact a poison control center or emergency room at once. NOTE: This medicine is only for you. Do not share this medicine with others. What if I miss a dose? If you miss a dose, take it as soon as you can. If it is almost time for your next dose, take only that dose. Do not take double or extra doses. What may interact with this medicine? Do not take this medicine with any of the following medications: -estrogens, like hormone replacement therapy or birth control pills This medicine may also interact with the following medications: -dietary supplements such as androstenedione or DHEA -prasterone -tamoxifen This list may not describe all possible interactions. Give your health care provider a list of all the medicines, herbs, non-prescription drugs, or dietary supplements you use. Also tell them if you smoke, drink alcohol, or use illegal drugs. Some items may interact with your  medicine. What should I watch for while using this medicine? Visit your doctor or health care professional for regular check-ups to monitor your condition. Do not use this drug if you are pregnant. Serious side effects to an unborn child are possible. Talk to your doctor or pharmacist for more information. You may get drowsy or dizzy. Do not drive, use machinery, or do anything that needs mental  alertness until you know how this medicine affects you. Do not stand or sit up quickly, especially if you are an older patient. This reduces the risk of dizzy or fainting spells. What side effects may I notice from receiving this medicine? Side effects that you should report to your doctor or health care professional as soon as possible: -allergic reactions like skin rash, itching, or hives -bone fracture -chest pain -difficulty breathing or shortness of breath -severe pain, swelling, warmth in the leg -unusually weak or tired -vaginal bleeding Side effects that usually do not require medical attention (report to your doctor or health care professional if they continue or are bothersome): -bone, back, joint, or muscle pain -dizziness -fatigue -fluid retention -headache -hot flashes, night sweats -nausea -weight gain This list may not describe all possible side effects. Call your doctor for medical advice about side effects. You may report side effects to FDA at 1-800-FDA-1088. Where should I keep my medicine? Keep out of the reach of children. Store between 15 and 30 degrees C (59 and 86 degrees F). Throw away any unused medicine after the expiration date. NOTE: This sheet is a summary. It may not cover all possible information. If you have questions about this medicine, talk to your doctor, pharmacist, or health care provider.    2016, Elsevier/Gold Standard. (2007-12-12 16:43:44)

## 2016-06-15 ENCOUNTER — Ambulatory Visit
Admission: RE | Admit: 2016-06-15 | Discharge: 2016-06-15 | Disposition: A | Discharge status: Home / Self Care | Payer: 59 | Source: Ambulatory Visit | Attending: Radiation Oncology | Admitting: Radiation Oncology

## 2016-06-15 ENCOUNTER — Other Ambulatory Visit: Payer: Self-pay | Admitting: *Deleted

## 2016-06-15 ENCOUNTER — Ambulatory Visit: Payer: 59

## 2016-06-15 ENCOUNTER — Other Ambulatory Visit: Payer: 59

## 2016-06-15 ENCOUNTER — Ambulatory Visit: Payer: 59 | Admitting: Hematology and Oncology

## 2016-06-15 DIAGNOSIS — Z510 Encounter for antineoplastic radiation therapy: Secondary | ICD-10-CM | POA: Diagnosis not present

## 2016-06-15 DIAGNOSIS — C50811 Malignant neoplasm of overlapping sites of right female breast: Secondary | ICD-10-CM | POA: Diagnosis not present

## 2016-06-15 DIAGNOSIS — C50111 Malignant neoplasm of central portion of right female breast: Secondary | ICD-10-CM

## 2016-06-15 DIAGNOSIS — C773 Secondary and unspecified malignant neoplasm of axilla and upper limb lymph nodes: Secondary | ICD-10-CM | POA: Diagnosis not present

## 2016-06-15 DIAGNOSIS — Z170 Estrogen receptor positive status [ER+]: Secondary | ICD-10-CM | POA: Diagnosis not present

## 2016-06-15 LAB — ESTRADIOL: Estradiol: 11 pg/mL

## 2016-06-15 LAB — FOLLICLE STIMULATING HORMONE: FSH: 80.2 m[IU]/mL

## 2016-06-16 ENCOUNTER — Encounter: Payer: Self-pay | Admitting: Hematology and Oncology

## 2016-06-19 ENCOUNTER — Ambulatory Visit
Admission: RE | Admit: 2016-06-19 | Discharge: 2016-06-19 | Disposition: A | Discharge status: Home / Self Care | Payer: 59 | Source: Ambulatory Visit | Attending: Radiation Oncology | Admitting: Radiation Oncology

## 2016-06-19 DIAGNOSIS — Z170 Estrogen receptor positive status [ER+]: Secondary | ICD-10-CM | POA: Diagnosis not present

## 2016-06-19 DIAGNOSIS — C773 Secondary and unspecified malignant neoplasm of axilla and upper limb lymph nodes: Secondary | ICD-10-CM | POA: Diagnosis not present

## 2016-06-19 DIAGNOSIS — C50811 Malignant neoplasm of overlapping sites of right female breast: Secondary | ICD-10-CM | POA: Diagnosis not present

## 2016-06-19 DIAGNOSIS — Z510 Encounter for antineoplastic radiation therapy: Secondary | ICD-10-CM | POA: Diagnosis not present

## 2016-06-20 ENCOUNTER — Ambulatory Visit
Admission: RE | Admit: 2016-06-20 | Discharge: 2016-06-20 | Disposition: A | Discharge status: Home / Self Care | Payer: 59 | Source: Ambulatory Visit | Attending: Radiation Oncology | Admitting: Radiation Oncology

## 2016-06-20 DIAGNOSIS — Z510 Encounter for antineoplastic radiation therapy: Secondary | ICD-10-CM | POA: Diagnosis not present

## 2016-06-20 DIAGNOSIS — C773 Secondary and unspecified malignant neoplasm of axilla and upper limb lymph nodes: Secondary | ICD-10-CM | POA: Diagnosis not present

## 2016-06-20 DIAGNOSIS — Z853 Personal history of malignant neoplasm of breast: Secondary | ICD-10-CM | POA: Diagnosis not present

## 2016-06-20 DIAGNOSIS — C50811 Malignant neoplasm of overlapping sites of right female breast: Secondary | ICD-10-CM | POA: Diagnosis not present

## 2016-06-20 DIAGNOSIS — Z170 Estrogen receptor positive status [ER+]: Secondary | ICD-10-CM | POA: Diagnosis not present

## 2016-06-21 ENCOUNTER — Ambulatory Visit
Admission: RE | Admit: 2016-06-21 | Discharge: 2016-06-21 | Disposition: A | Discharge status: Home / Self Care | Payer: 59 | Source: Ambulatory Visit | Attending: Radiation Oncology | Admitting: Radiation Oncology

## 2016-06-21 ENCOUNTER — Other Ambulatory Visit: Payer: Self-pay | Admitting: *Deleted

## 2016-06-21 DIAGNOSIS — C50811 Malignant neoplasm of overlapping sites of right female breast: Secondary | ICD-10-CM | POA: Diagnosis not present

## 2016-06-21 DIAGNOSIS — Z510 Encounter for antineoplastic radiation therapy: Secondary | ICD-10-CM | POA: Diagnosis not present

## 2016-06-21 DIAGNOSIS — Z170 Estrogen receptor positive status [ER+]: Secondary | ICD-10-CM | POA: Diagnosis not present

## 2016-06-21 DIAGNOSIS — C773 Secondary and unspecified malignant neoplasm of axilla and upper limb lymph nodes: Secondary | ICD-10-CM | POA: Diagnosis not present

## 2016-06-21 MED ORDER — SILVER SULFADIAZINE 1 % EX CREA: 1.0000 "application " | TOPICAL_CREAM | Freq: Two times a day (BID) | CUTANEOUS | 2 refills | Status: DC

## 2016-06-22 ENCOUNTER — Ambulatory Visit
Admission: RE | Admit: 2016-06-22 | Discharge: 2016-06-22 | Disposition: A | Discharge status: Home / Self Care | Payer: 59 | Source: Ambulatory Visit | Attending: Radiation Oncology | Admitting: Radiation Oncology

## 2016-06-22 DIAGNOSIS — Z510 Encounter for antineoplastic radiation therapy: Secondary | ICD-10-CM | POA: Diagnosis not present

## 2016-06-22 DIAGNOSIS — C773 Secondary and unspecified malignant neoplasm of axilla and upper limb lymph nodes: Secondary | ICD-10-CM | POA: Diagnosis not present

## 2016-06-22 DIAGNOSIS — C50811 Malignant neoplasm of overlapping sites of right female breast: Secondary | ICD-10-CM | POA: Diagnosis not present

## 2016-06-22 DIAGNOSIS — Z170 Estrogen receptor positive status [ER+]: Secondary | ICD-10-CM | POA: Diagnosis not present

## 2016-06-25 ENCOUNTER — Ambulatory Visit
Admission: RE | Admit: 2016-06-25 | Discharge: 2016-06-25 | Disposition: A | Discharge status: Home / Self Care | Payer: 59 | Source: Ambulatory Visit | Attending: Radiation Oncology | Admitting: Radiation Oncology

## 2016-06-25 DIAGNOSIS — Z170 Estrogen receptor positive status [ER+]: Secondary | ICD-10-CM | POA: Diagnosis not present

## 2016-06-25 DIAGNOSIS — C50811 Malignant neoplasm of overlapping sites of right female breast: Secondary | ICD-10-CM | POA: Diagnosis not present

## 2016-06-25 DIAGNOSIS — C773 Secondary and unspecified malignant neoplasm of axilla and upper limb lymph nodes: Secondary | ICD-10-CM | POA: Diagnosis not present

## 2016-06-25 DIAGNOSIS — Z510 Encounter for antineoplastic radiation therapy: Secondary | ICD-10-CM | POA: Diagnosis not present

## 2016-07-02 ENCOUNTER — Telehealth: Payer: Self-pay | Admitting: *Deleted

## 2016-07-02 NOTE — Telephone Encounter (Signed)
Called pt back and let her know that Dr. Mike Gip said it would be fine for pt to have flu shot when she comes this week.  Also spoke to her about the long term disability form to be filled out and she will talk to [place] this week when she sees her because her reconstructive surgery will not be until Dec.

## 2016-07-02 NOTE — Telephone Encounter (Signed)
Asking if she can get the flu shot when she comes in for her Herceptin on 9/21. She would like a return call back asap to let her know

## 2016-07-05 ENCOUNTER — Inpatient Hospital Stay (HOSPITAL_BASED_OUTPATIENT_CLINIC_OR_DEPARTMENT_OTHER): Payer: 59 | Admitting: Hematology and Oncology

## 2016-07-05 ENCOUNTER — Other Ambulatory Visit: Payer: Self-pay | Admitting: Hematology and Oncology

## 2016-07-05 ENCOUNTER — Inpatient Hospital Stay: Payer: 59

## 2016-07-05 ENCOUNTER — Inpatient Hospital Stay: Payer: 59 | Attending: Hematology and Oncology

## 2016-07-05 VITALS — BP 144/79 | HR 98 | Temp 98.0°F | Resp 18 | Wt 110.9 lb

## 2016-07-05 DIAGNOSIS — D649 Anemia, unspecified: Secondary | ICD-10-CM | POA: Diagnosis not present

## 2016-07-05 DIAGNOSIS — R5383 Other fatigue: Secondary | ICD-10-CM

## 2016-07-05 DIAGNOSIS — F419 Anxiety disorder, unspecified: Secondary | ICD-10-CM

## 2016-07-05 DIAGNOSIS — F329 Major depressive disorder, single episode, unspecified: Secondary | ICD-10-CM | POA: Diagnosis not present

## 2016-07-05 DIAGNOSIS — Z803 Family history of malignant neoplasm of breast: Secondary | ICD-10-CM

## 2016-07-05 DIAGNOSIS — N189 Chronic kidney disease, unspecified: Secondary | ICD-10-CM

## 2016-07-05 DIAGNOSIS — C50111 Malignant neoplasm of central portion of right female breast: Secondary | ICD-10-CM

## 2016-07-05 DIAGNOSIS — Z809 Family history of malignant neoplasm, unspecified: Secondary | ICD-10-CM | POA: Diagnosis not present

## 2016-07-05 DIAGNOSIS — R232 Flushing: Secondary | ICD-10-CM

## 2016-07-05 DIAGNOSIS — Z87891 Personal history of nicotine dependence: Secondary | ICD-10-CM

## 2016-07-05 DIAGNOSIS — K219 Gastro-esophageal reflux disease without esophagitis: Secondary | ICD-10-CM

## 2016-07-05 DIAGNOSIS — G629 Polyneuropathy, unspecified: Secondary | ICD-10-CM | POA: Diagnosis not present

## 2016-07-05 DIAGNOSIS — Z9221 Personal history of antineoplastic chemotherapy: Secondary | ICD-10-CM | POA: Diagnosis not present

## 2016-07-05 DIAGNOSIS — R0602 Shortness of breath: Secondary | ICD-10-CM | POA: Diagnosis not present

## 2016-07-05 DIAGNOSIS — Z9013 Acquired absence of bilateral breasts and nipples: Secondary | ICD-10-CM | POA: Diagnosis not present

## 2016-07-05 DIAGNOSIS — C773 Secondary and unspecified malignant neoplasm of axilla and upper limb lymph nodes: Secondary | ICD-10-CM

## 2016-07-05 DIAGNOSIS — Z8041 Family history of malignant neoplasm of ovary: Secondary | ICD-10-CM | POA: Insufficient documentation

## 2016-07-05 DIAGNOSIS — Z170 Estrogen receptor positive status [ER+]: Secondary | ICD-10-CM

## 2016-07-05 DIAGNOSIS — Z79899 Other long term (current) drug therapy: Secondary | ICD-10-CM | POA: Insufficient documentation

## 2016-07-05 DIAGNOSIS — Z923 Personal history of irradiation: Secondary | ICD-10-CM | POA: Insufficient documentation

## 2016-07-05 DIAGNOSIS — Z5112 Encounter for antineoplastic immunotherapy: Secondary | ICD-10-CM | POA: Diagnosis not present

## 2016-07-05 LAB — CBC WITH DIFFERENTIAL/PLATELET
Basophils Absolute: 0 10*3/uL (ref 0–0.1)
Basophils Relative: 0 %
Eosinophils Absolute: 0.1 10*3/uL (ref 0–0.7)
Eosinophils Relative: 1 %
HCT: 35 % (ref 35.0–47.0)
Hemoglobin: 11.9 g/dL — ABNORMAL LOW (ref 12.0–16.0)
Lymphocytes Relative: 11 %
Lymphs Abs: 0.7 10*3/uL — ABNORMAL LOW (ref 1.0–3.6)
MCH: 29.8 pg (ref 26.0–34.0)
MCHC: 34 g/dL (ref 32.0–36.0)
MCV: 87.7 fL (ref 80.0–100.0)
Monocytes Absolute: 0.4 10*3/uL (ref 0.2–0.9)
Monocytes Relative: 7 %
Neutro Abs: 4.8 10*3/uL (ref 1.4–6.5)
Neutrophils Relative %: 81 %
Platelets: 275 10*3/uL (ref 150–440)
RBC: 3.99 MIL/uL (ref 3.80–5.20)
RDW: 14.6 % — ABNORMAL HIGH (ref 11.5–14.5)
WBC: 5.9 10*3/uL (ref 3.6–11.0)

## 2016-07-05 LAB — COMPREHENSIVE METABOLIC PANEL
ALT: 14 U/L (ref 14–54)
AST: 20 U/L (ref 15–41)
Albumin: 4.2 g/dL (ref 3.5–5.0)
Alkaline Phosphatase: 64 U/L (ref 38–126)
Anion gap: 7 (ref 5–15)
BUN: 13 mg/dL (ref 6–20)
CO2: 25 mmol/L (ref 22–32)
Calcium: 9.2 mg/dL (ref 8.9–10.3)
Chloride: 106 mmol/L (ref 101–111)
Creatinine, Ser: 0.67 mg/dL (ref 0.44–1.00)
GFR calc Af Amer: >60 mL/min (ref >60)
GFR calc non Af Amer: >60 mL/min (ref >60)
Glucose, Bld: 136 mg/dL — ABNORMAL HIGH (ref 65–99)
Potassium: 3.9 mmol/L (ref 3.5–5.1)
Sodium: 138 mmol/L (ref 135–145)
Total Bilirubin: 0.6 mg/dL (ref 0.3–1.2)
Total Protein: 7.1 g/dL (ref 6.5–8.1)

## 2016-07-05 MED ORDER — SODIUM CHLORIDE 0.9 % IV SOLN
Freq: Once | INTRAVENOUS | Status: AC
  Administered 2016-07-05: 15:00:00 via INTRAVENOUS
  Filled 2016-07-05: qty 1000

## 2016-07-05 MED ORDER — TRASTUZUMAB CHEMO 150 MG IV SOLR
294.0000 mg | Freq: Once | INTRAVENOUS | Status: AC
  Administered 2016-07-05: 294 mg via INTRAVENOUS
  Filled 2016-07-05: qty 14

## 2016-07-05 MED ORDER — TAMOXIFEN CITRATE 20 MG PO TABS: 20.0000 mg | ORAL_TABLET | Freq: Every day | ORAL | 5 refills | Status: DC

## 2016-07-05 MED ORDER — ACETAMINOPHEN 325 MG PO TABS
650.0000 mg | ORAL_TABLET | Freq: Once | ORAL | Status: AC
  Administered 2016-07-05: 650 mg via ORAL
  Filled 2016-07-05: qty 2

## 2016-07-05 MED ORDER — DIPHENHYDRAMINE HCL 25 MG PO CAPS
25.0000 mg | ORAL_CAPSULE | Freq: Once | ORAL | Status: AC
  Administered 2016-07-05: 25 mg via ORAL
  Filled 2016-07-05: qty 1

## 2016-07-05 NOTE — Progress Notes (Signed)
[facility] day:  07/05/16   Chief Complaint: Emma Atkinson is a 50 y.o. female with clinical stage IIB Her2/neu+ right breast cancer who is seen for assessment prior to initiation of hormonal therapy and continuation of every 3 week Herceptin.  HPI:  The patient was last seen in the medical oncology clinic on 06/14/2016.  At that time, she felt fatigued.  Hematocrit was 37.4.  Echo on 05/25/2016 revealed an EF of 55-65%.  She continued every 3 week Herceptin.  Radiation continued.  We discussed plans for hormonal therapy post radiation.  Labs on 06/14/2016 revealed a normal CMP.  FSH was 80.2 with an estradiol of 11.0.  During the interim, she has done well.  Radiation completed on 06/25/2016.  She met with Dr Patrice Paradise of plastic surgery on 06/28/2016.  Consideration is being made for possible reconstruction at the end of 07/2016.  She notes hysterectomy with ovaries remaining in 2011.  She had no hot flashes prior to treatment.  She has had some since treatment.   Past Medical History:  Diagnosis Date  . Anemia    H/O  . Anxiety   . Breast cancer ([place])   . Chronic kidney disease 12-2015   PYLONEPHRITIS  . Complication of anesthesia    HARD TO WAKE UP AFTER TONSILLECTOMY AS A CHILD BUT NO PROBLEMS SINCE  . Complication of anesthesia    PT STATES THAT BACK IN OR DURING PORT PLACMENT IN 2016 THAT SHE BECAME VEERY FLUSHED, LIGHT HEADED AND CRNA SAID ANTIBIOTIC MAY BE GOING IN IN TO FAST-RATE DECREASED AND PTS SYMPTOMS RESOLVED IMMEDIATELY  . Depression   . GERD (gastroesophageal reflux disease)   . Shortness of breath dyspnea     Past Surgical History:  Procedure Laterality Date  . ABDOMINAL HYSTERECTOMY  2001  . BACK SURGERY     FOR SCOLIOSIS-HERRINGTON RODS IN PLACE  . BREAST BIOPSY Bilateral    08/17/2015   . PORTACATH PLACEMENT N/A 08/30/2015   Procedure: INSERTION PORT-A-CATH;  Surgeon: Leonie Green, MD;  Location: [facility] ORS;   Service: General;  Laterality: N/A;  . SENTINEL NODE BIOPSY Bilateral 03/08/2016   Procedure: SENTINEL NODE BIOPSY;  Surgeon: Leonie Green, MD;  Location: [facility] ORS;  Service: General;  Laterality: Bilateral;  . SIMPLE MASTECTOMY WITH AXILLARY SENTINEL NODE BIOPSY Bilateral 03/08/2016   Procedure: SIMPLE MASTECTOMY;  Surgeon: Leonie Green, MD;  Location: [facility] ORS;  Service: General;  Laterality: Bilateral;  . TONSILLECTOMY      Family History  Problem Relation Age of Onset  . Breast cancer Maternal Aunt 64  . Cancer Father   . Cancer Sister   . Cancer Maternal Grandfather     Social History:  reports that she quit smoking about 16 years ago. Her smoking use included Cigarettes. She has a 10.00 pack-year smoking history. She does not have any smokeless tobacco history on file. She reports that she drinks alcohol. She reports that she does not use drugs.  She is currently on long term disability.  The patient is alone today.  Allergies:  Allergies  Allergen Reactions  . Ceftin [Cefuroxime Axetil] Rash    Current Medications: Current Outpatient Prescriptions  Medication Sig Dispense Refill  . acetaminophen (TYLENOL) 500 MG tablet Take 1,000 mg by mouth every 6 (six) hours as needed.    . ALPRAZolam (XANAX) 0.25 MG tablet Take 1/4 tablet up to three times daily QHS    . Dexlansoprazole (DEXILANT) 30  MG capsule Take 30 mg by mouth as needed.     . diphenhydrAMINE (SOMINEX) 25 MG tablet Take 25 mg by mouth at bedtime. Reported on 01/12/2016    . HYDROcodone-acetaminophen (NORCO/VICODIN) 5-325 MG tablet Take 1-2 tablets by mouth every 4 (four) hours as needed for moderate pain. 16 tablet 0  . lidocaine-prilocaine (EMLA) cream Apply 1 application topically as needed. Apply at least 1-2 hours before the port is to be used. 30 g 1  . LORazepam (ATIVAN) 0.5 MG tablet Take 1 tablet (0.5 mg total) by mouth every 6 (six) hours as needed (Nausea or vomiting). 30 tablet 0  . Multiple  Vitamin (MULTIVITAMIN) capsule Take 1 capsule by mouth daily.    . silver sulfADIAZINE (SILVADENE) 1 % cream Apply 1 application topically 2 (two) times daily. 50 g 2   No current facility-administered medications for this visit.     Review of Systems:  GENERAL:  Feeling better.  No fevers or sweats.  Weight stable. PERFORMANCE STATUS (ECOG):  1 HEENT:  No visual changes, runny nose, sore throat, mouth sores or tenderness. Lungs: No shortness of breath or cough.  No pleuritic chest pain.  No hemoptysis. Cardiac:  No chest pain, palpitations, orthopnea, or PND. GI:  Constipation on Miralax.  No nausea, vomiting, diarrhea, melena or hematochezia. GU:  No urgency, frequency,  Dysuria or hematuria.  Musculoskeletal:  Back pain.  No joint pain.  No muscle tenderness. Extremities:  No pain or swelling. Skin:  No rashes or skin changes. Neuro:  Minimal numbness/tingling in tips of fingers.  No weakness, balance or coordination issues. Endocrine:  Mild hot flashes.  No diabetes, thyroid issues, or night sweats. Psych:  Poor sleep.  No mood changes. Pain:  No focal pain. Review of systems:  All other systems reviewed and found to be negative.  Physical Exam: Blood pressure (!) 144/79, pulse 98, temperature 98 F (36.7 C), temperature source Tympanic, resp. rate 18, weight 110 lb 14.3 oz (50.3 kg). O2 sats 94% with ambulation. GENERAL:  Thin tan woman sitting comfortably in the exam room in no acute distress.  MENTAL STATUS:  Alert and oriented to person, place and time. HEAD:  Short gray hair.  Normocephalic, atraumatic, face symmetric (thin), no Cushingoid features. EYES:  Blue eyes.  Pupils equal round and reactive to light and accomodation.  No conjunctivitis or scleral icterus. ENT:  Oropharynx clear without lesion.  Tongue normal. Mucous membranes moist.  RESPIRATORY:  Clear to auscultation without rales, wheezes or rhonchi. CARDIOVASCULAR:  Regular rate and rhythm without murmur, rub or  gallop. No JVD. ABDOMEN:  Soft, non-tender, with active bowel sounds, and no hepatosplenomegaly.  No masses. SKIN:  Tan.  Right sided posterior chest wall hyperpigmentation.  No rashes, ulcers or lesions. EXTREMITIES: No edema, no skin discoloration or tenderness.  No palpable cords. LYMPH NODES: No palpable cervical, supraclavicular, axillary or inguinal adenopathy  NEUROLOGICAL: Unremarkable. PSYCH:  Appropriate.    Appointment on 07/05/2016  Component Date Value Ref Range Status  . WBC 07/05/2016 5.9  3.6 - 11.0 K/uL Final  . RBC 07/05/2016 3.99  3.80 - 5.20 MIL/uL Final  . Hemoglobin 07/05/2016 11.9* 12.0 - 16.0 g/dL Final  . HCT 07/05/2016 35.0  35.0 - 47.0 % Final  . MCV 07/05/2016 87.7  80.0 - 100.0 fL Final  . MCH 07/05/2016 29.8  26.0 - 34.0 pg Final  . MCHC 07/05/2016 34.0  32.0 - 36.0 g/dL Final  . RDW 07/05/2016 14.6* 11.5 - 14.5 %  Final  . Platelets 07/05/2016 275  150 - 440 K/uL Final  . Neutrophils Relative % 07/05/2016 81  % Final  . Neutro Abs 07/05/2016 4.8  1.4 - 6.5 K/uL Final  . Lymphocytes Relative 07/05/2016 11  % Final  . Lymphs Abs 07/05/2016 0.7* 1.0 - 3.6 K/uL Final  . Monocytes Relative 07/05/2016 7  % Final  . Monocytes Absolute 07/05/2016 0.4  0.2 - 0.9 K/uL Final  . Eosinophils Relative 07/05/2016 1  % Final  . Eosinophils Absolute 07/05/2016 0.1  0 - 0.7 K/uL Final  . Basophils Relative 07/05/2016 0  % Final  . Basophils Absolute 07/05/2016 0.0  0 - 0.1 K/uL Final  . Sodium 07/05/2016 138  135 - 145 mmol/L Final  . Potassium 07/05/2016 3.9  3.5 - 5.1 mmol/L Final  . Chloride 07/05/2016 106  101 - 111 mmol/L Final  . CO2 07/05/2016 25  22 - 32 mmol/L Final  . Glucose, Bld 07/05/2016 136* 65 - 99 mg/dL Final  . BUN 07/05/2016 13  6 - 20 mg/dL Final  . Creatinine, Ser 07/05/2016 0.67  0.44 - 1.00 mg/dL Final  . Calcium 07/05/2016 9.2  8.9 - 10.3 mg/dL Final  . Total Protein 07/05/2016 7.1  6.5 - 8.1 g/dL Final  . Albumin 07/05/2016 4.2  3.5 - 5.0  g/dL Final  . AST 07/05/2016 20  15 - 41 U/L Final  . ALT 07/05/2016 14  14 - 54 U/L Final  . Alkaline Phosphatase 07/05/2016 64  38 - 126 U/L Final  . Total Bilirubin 07/05/2016 0.6  0.3 - 1.2 mg/dL Final  . GFR calc non Af Amer 07/05/2016 >60  >60 mL/min Final  . GFR calc Af Amer 07/05/2016 >60  >60 mL/min Final   Comment: (NOTE) The eGFR has been calculated using the CKD EPI equation. This calculation has not been validated in all clinical situations. eGFR's persistently <60 mL/min signify possible Chronic Kidney Disease.   . Anion gap 07/05/2016 7  5 - 15 Final    Assessment:  AMARISA WILINSKI is a 50 y.o. female with clinical stage T2N1 (stage IIB) Her2/neu + right breast cancer s/p neoadjuvant chemotherapy and bilateral mastectomy.  She is currently receiving Herceptin every 3 weeks.  She presented with a minimally painful right breast mass with nipple inversion.  Bilateral mammogram and ultrasound on 08/12/2015 revealed a 4.1 cm right superior breast mass containing pleomorphic microcalcifications. There were several satellite lesions (1.3 cm, 0.9 cm, 0.6 cm) plus a 1.3 cm mass versus complicated cyst and a 0.8 cm cyst.  There was one 5 mm right axillary node with irregular thickened cortex.   Core needle biopsy on 08/17/2015 of the right breast mass revealed a grade 3 invasive mammary carcinoma. Right axillary biopsy confirmed metastatic mammary carcinoma. Tumor was ER positive (1-10%), PR positive (11-50%) and HER-2/neu 3+  PET scan on 08/26/2015 revealed hypermetabolic right breast mass (SUV 11.3) with a hypermetabolic right axillary lymph node and a subtle focus of very faint hypermetabolic activity laterally in the right breast. There was faint hypermetabolic activity at the biopsy site of the left breast (? biopsy related).    CA27.29 was 19.6 on 08/22/2015, 24.7 on 01/10/2016, and 15.5 on 03/23/2016.  She received 4 cycles of AC (09/13/2015 - 11/08/2015) with Neulasta support.   She received 4 cycles of Herceptin and Perjeta (11/29/2015 - 01/31/2016) and 12 weeks of Taxol (11/29/2015 - 02/14/2016).  She tolerated her chemotherapy well.  She developed a grade I  neuropathy.  She receives Herceptin every 3 weeks (last 06/14/2016)  Bilateral mastectomy on 03/08/2016 revealed no residual disease in the right breast.  Five lymph nodes were negative for malignancy.  Pathologic stage was ypT0 ypN0.  Left breast revealed benign intraductal papillomas and cysts adjacent to the biopsy site.  There was no atypia or malignancy.  She received 50.4 Gy right chest wall radiation from 05/07/2016 - 06/25/2016.  She is planning reconstruction at the end of 07/2016.  She is s/p partial hysterectomy (ovaries remain) in 2001.  She was premenopausal ([place] 5 and estradiol 271.9) on 08/22/2015.  Echo on 08/26/2015 revealed an ejection fraction of 55-60%.  Echo on 11/25/2015 revealed an EF of 60-65%.  Echo on 02/23/2016 and 05/25/2016 revealed an EF of 55-65%.  She has a family history of breast and ovarian cancer.  My Risk genetic testing revealed no mutation on 08/22/2015.  Symptomatically, she feels good.  Exam is unremarkable.  Plan: 1.  Labs today:  CBC with diff, CMP. 2.  Herceptin today.  Continue Benadryl pre-med to 25 mg.   3.  Discuss hormonal therapy.  Discuss definition of menopause.  Discuss tamoxifen versus aromatase inhibitors.  Discuss FSH and estradiol (possible transient menopause).  Discuss plan to start tamoxifen and follow-up FSH and estradiol with possible switch to AI in future.  Discuss BCI testing in 4 1/2 years and likely need for total 10 years adjuvant hormonal therapy if BCI confirms high risk. 4.  Rx:  Tamoxifen 20 mg po q day. 5.  Follow-up with plastic surgery. 6.  Anticipate next echo on 08/25/2016. 7.  RTC in 3 weeks for MD assessment, labs (CBC with diff, LFTs), and Herceptin.   Lequita Asal, MD  07/05/2016, 2:35 PM

## 2016-07-05 NOTE — Progress Notes (Signed)
Patient states she is not sleeping well at all.  She has been taking 2 Xanax to help.  Is there something more than can be done to help her?

## 2016-07-06 ENCOUNTER — Other Ambulatory Visit: Payer: Self-pay | Admitting: *Deleted

## 2016-07-06 DIAGNOSIS — C50811 Malignant neoplasm of overlapping sites of right female breast: Secondary | ICD-10-CM

## 2016-07-07 ENCOUNTER — Encounter: Payer: Self-pay | Admitting: Hematology and Oncology

## 2016-07-26 ENCOUNTER — Inpatient Hospital Stay: Payer: 59

## 2016-07-26 ENCOUNTER — Encounter: Payer: Self-pay | Admitting: Internal Medicine

## 2016-07-26 ENCOUNTER — Ambulatory Visit
Admission: RE | Admit: 2016-07-26 | Discharge: 2016-07-26 | Disposition: A | Discharge status: Home / Self Care | Payer: 59 | Source: Ambulatory Visit | Attending: Radiation Oncology | Admitting: Radiation Oncology

## 2016-07-26 ENCOUNTER — Other Ambulatory Visit: Payer: Self-pay | Admitting: Hematology and Oncology

## 2016-07-26 ENCOUNTER — Inpatient Hospital Stay: Payer: 59 | Attending: Hematology and Oncology

## 2016-07-26 ENCOUNTER — Inpatient Hospital Stay (HOSPITAL_BASED_OUTPATIENT_CLINIC_OR_DEPARTMENT_OTHER): Payer: 59 | Admitting: Internal Medicine

## 2016-07-26 DIAGNOSIS — Z79810 Long term (current) use of selective estrogen receptor modulators (SERMs): Secondary | ICD-10-CM

## 2016-07-26 DIAGNOSIS — F419 Anxiety disorder, unspecified: Secondary | ICD-10-CM

## 2016-07-26 DIAGNOSIS — R0602 Shortness of breath: Secondary | ICD-10-CM | POA: Diagnosis not present

## 2016-07-26 DIAGNOSIS — Z79811 Long term (current) use of aromatase inhibitors: Secondary | ICD-10-CM | POA: Insufficient documentation

## 2016-07-26 DIAGNOSIS — C7989 Secondary malignant neoplasm of other specified sites: Secondary | ICD-10-CM | POA: Insufficient documentation

## 2016-07-26 DIAGNOSIS — Z87891 Personal history of nicotine dependence: Secondary | ICD-10-CM

## 2016-07-26 DIAGNOSIS — Z9013 Acquired absence of bilateral breasts and nipples: Secondary | ICD-10-CM | POA: Diagnosis not present

## 2016-07-26 DIAGNOSIS — C50811 Malignant neoplasm of overlapping sites of right female breast: Secondary | ICD-10-CM | POA: Insufficient documentation

## 2016-07-26 DIAGNOSIS — C50111 Malignant neoplasm of central portion of right female breast: Secondary | ICD-10-CM

## 2016-07-26 DIAGNOSIS — Z79899 Other long term (current) drug therapy: Secondary | ICD-10-CM | POA: Insufficient documentation

## 2016-07-26 DIAGNOSIS — Z170 Estrogen receptor positive status [ER+]: Secondary | ICD-10-CM | POA: Insufficient documentation

## 2016-07-26 DIAGNOSIS — Z923 Personal history of irradiation: Secondary | ICD-10-CM | POA: Diagnosis not present

## 2016-07-26 DIAGNOSIS — Z809 Family history of malignant neoplasm, unspecified: Secondary | ICD-10-CM

## 2016-07-26 DIAGNOSIS — K219 Gastro-esophageal reflux disease without esophagitis: Secondary | ICD-10-CM

## 2016-07-26 DIAGNOSIS — F329 Major depressive disorder, single episode, unspecified: Secondary | ICD-10-CM | POA: Diagnosis not present

## 2016-07-26 DIAGNOSIS — R5383 Other fatigue: Secondary | ICD-10-CM | POA: Diagnosis not present

## 2016-07-26 DIAGNOSIS — Z803 Family history of malignant neoplasm of breast: Secondary | ICD-10-CM | POA: Insufficient documentation

## 2016-07-26 DIAGNOSIS — C50911 Malignant neoplasm of unspecified site of right female breast: Secondary | ICD-10-CM | POA: Insufficient documentation

## 2016-07-26 DIAGNOSIS — Z5112 Encounter for antineoplastic immunotherapy: Secondary | ICD-10-CM | POA: Insufficient documentation

## 2016-07-26 LAB — CBC WITH DIFFERENTIAL/PLATELET
Basophils Absolute: 0 10*3/uL (ref 0–0.1)
Basophils Relative: 0 %
Eosinophils Absolute: 0.1 10*3/uL (ref 0–0.7)
Eosinophils Relative: 2 %
HCT: 34.7 % — ABNORMAL LOW (ref 35.0–47.0)
Hemoglobin: 11.9 g/dL — ABNORMAL LOW (ref 12.0–16.0)
Lymphocytes Relative: 13 %
Lymphs Abs: 0.7 10*3/uL — ABNORMAL LOW (ref 1.0–3.6)
MCH: 30.6 pg (ref 26.0–34.0)
MCHC: 34.3 g/dL (ref 32.0–36.0)
MCV: 89.3 fL (ref 80.0–100.0)
Monocytes Absolute: 0.4 10*3/uL (ref 0.2–0.9)
Monocytes Relative: 7 %
Neutro Abs: 4.3 10*3/uL (ref 1.4–6.5)
Neutrophils Relative %: 78 %
Platelets: 267 10*3/uL (ref 150–440)
RBC: 3.89 MIL/uL (ref 3.80–5.20)
RDW: 14.1 % (ref 11.5–14.5)
WBC: 5.4 10*3/uL (ref 3.6–11.0)

## 2016-07-26 LAB — HEPATIC FUNCTION PANEL
ALT: 16 U/L (ref 14–54)
AST: 25 U/L (ref 15–41)
Albumin: 4.1 g/dL (ref 3.5–5.0)
Alkaline Phosphatase: 59 U/L (ref 38–126)
Bilirubin, Direct: <0.1 mg/dL — ABNORMAL LOW (ref 0.1–0.5)
Total Bilirubin: 0.9 mg/dL (ref 0.3–1.2)
Total Protein: 6.8 g/dL (ref 6.5–8.1)

## 2016-07-26 MED ORDER — TRASTUZUMAB CHEMO 150 MG IV SOLR
294.0000 mg | Freq: Once | INTRAVENOUS | Status: AC
  Administered 2016-07-26: 294 mg via INTRAVENOUS
  Filled 2016-07-26: qty 14

## 2016-07-26 MED ORDER — SODIUM CHLORIDE 0.9% FLUSH
10.0000 mL | INTRAVENOUS | Status: DC | PRN
  Administered 2016-07-26: 10 mL
  Filled 2016-07-26: qty 10

## 2016-07-26 MED ORDER — VENLAFAXINE HCL ER 37.5 MG PO CP24: 37.5000 mg | ORAL_CAPSULE | Freq: Every day | ORAL | 3 refills | Status: DC

## 2016-07-26 MED ORDER — DIPHENHYDRAMINE HCL 25 MG PO CAPS
25.0000 mg | ORAL_CAPSULE | Freq: Once | ORAL | Status: AC
  Administered 2016-07-26: 25 mg via ORAL
  Filled 2016-07-26: qty 1

## 2016-07-26 MED ORDER — SODIUM CHLORIDE 0.9 % IV SOLN
Freq: Once | INTRAVENOUS | Status: AC
  Administered 2016-07-26: 10:00:00 via INTRAVENOUS
  Filled 2016-07-26: qty 1000

## 2016-07-26 MED ORDER — HEPARIN SOD (PORK) LOCK FLUSH 100 UNIT/ML IV SOLN
500.0000 [IU] | Freq: Once | INTRAVENOUS | Status: AC | PRN
  Administered 2016-07-26: 500 [IU]
  Filled 2016-07-26: qty 5

## 2016-07-26 MED ORDER — ACETAMINOPHEN 325 MG PO TABS
650.0000 mg | ORAL_TABLET | Freq: Once | ORAL | Status: AC
  Administered 2016-07-26: 650 mg via ORAL
  Filled 2016-07-26: qty 2

## 2016-07-26 NOTE — Progress Notes (Signed)
Pt reports her fatigue is increasing and making it difficult to do anything.  Would like to know if the tamoxifen is making her more emotional and jittery, because pt has felt more that way since beginning the medication.

## 2016-07-26 NOTE — Assessment & Plan Note (Addendum)
ER/PR positive HER-2/neu positive breast cancer with complete response post neoadjuvant chemotherapy; currently on adjuvant Herceptin; patient status post radiation.  # Tolerating Herceptin well; proceed with treatment today. The estimated ejection   fraction was in the range of 55% to 60% [Aug 11th]; will need Echo prior to next treatment.   # On tamoxifen- side effects- hot-flashes/ mood swings/irritability/fatigue-- recommend Effexor 37.54m/day  # PN-1- monitor for now.   # follow up in 3 weeks/ labs/ herceptin; wil need 2 d echo at next visit.

## 2016-07-26 NOTE — Progress Notes (Signed)
Radiation Oncology Follow up Note  Name: Emma Atkinson   Date:   07/26/2016 MRN:  [id number] DOB: 1966/06/26    This 50 y.o. female presents to the clinic today for one-month follow-up status post. Ration therapy to her right chest wall peripheral lymphatics for stage IIIB invasive mammary carcinoma triple positive  REFERRING PROVIDER: Idelle Crouch, MD  HPI: Patient is a 50 year old female now seen out 1 month having completed right chest wall peripheral hepatic radiation therapy for stage IIIB (T4 be N1 a M0) grade 3 invasive mammary carcinoma triple positive. She was status post neoadjuvant chemotherapy followed by bilateral mastectomies. She is seen today in routine follow-up and is doing well. She continues on Herceptin tolerating that well without side effect.. She is currently on tamoxifen has some side effects such as mood swings and hot flashes although otherwise tolerating treatments well. She specifically denies any new masses or nodularity the chest wall or any swelling of the right upper extremity. She is scheduled to meet with surgeon to discuss bilateral breast reconstruction and next month.  COMPLICATIONS OF TREATMENT: none  FOLLOW UP COMPLIANCE: keeps appointments   PHYSICAL EXAM:  There were no vitals taken for this visit. Patient is status post bilateral mastectomies. No evidence of mass or nodularity in the right chest wall is noted. No axillary or supraclavicular adenopathy is identified. No evidence of lymphedema of the right upper extremity is noted. Well-developed well-nourished patient in NAD. HEENT reveals PERLA, EOMI, discs not visualized.  Oral cavity is clear. No oral mucosal lesions are identified. Neck is clear without evidence of cervical or supraclavicular adenopathy. Lungs are clear to A&P. Cardiac examination is essentially unremarkable with regular rate and rhythm without murmur rub or thrill. Abdomen is benign with no organomegaly or masses noted. Motor  sensory and DTR levels are equal and symmetric in the upper and lower extremities. Cranial nerves II through XII are grossly intact. Proprioception is intact. No peripheral adenopathy or edema is identified. No motor or sensory levels are noted. Crude visual fields are within normal range.  RADIOLOGY RESULTS: No current films for review  PLAN: Present time she is doing well 1 month out from chest wall peripheral lymphatic radiation. She currently is on tamoxifen as well as continuing on Herceptin. I am please were overall progress. She is scheduled to start breast reconstruction the nests next several months. I have asked to see her back in 4-5 months for follow-up. Patient knows to call with any concerns.  I would like to take this opportunity to thank you for allowing me to participate in the care of your patient.Armstead Peaks., MD

## 2016-07-26 NOTE — Progress Notes (Signed)
[place] [facility] OFFICE PROGRESS NOTE  Patient Care Team: Idelle Crouch, MD as PCP - General (Internal Medicine) Leonie Green, MD as Referring Physician (Surgery)  No matching staging information was found for the patient.    No history exists.     Patient to Dr. Mike Gip seen by me in her absence.    INTERVAL HISTORY:  Emma Atkinson 50 y.o.  female pleasant patient  history of Stage II ER/PR positive HER-2/neu positive breast cancer with complete response post neoadjuvant chemotherapy; currently on adjuvant Herceptin-is here for follow-up. Patient status post radiation. Patient is currently on tamoxifen.  Patient complains of mood swings; hot flashes since starting tamoxifen. Denies any unusual shortness of breath or chest pain or cough. Denies any headaches. Denies any swelling in legs. No skin rash. No diarrhea. Patient does complain of significant fatigue. Complains of mild tingling and numbness in her feet.    REVIEW OF SYSTEMS:  A complete 10 point review of system is done which is negative except mentioned above/history of present illness.   PAST MEDICAL HISTORY :  Past Medical History:  Diagnosis Date  . Anemia    H/O  . Anxiety   . Breast cancer ([place])   . Chronic kidney disease 12-2015   PYLONEPHRITIS  . Complication of anesthesia    HARD TO WAKE UP AFTER TONSILLECTOMY AS A CHILD BUT NO PROBLEMS SINCE  . Complication of anesthesia    PT STATES THAT BACK IN OR DURING PORT PLACMENT IN 2016 THAT SHE BECAME VEERY FLUSHED, LIGHT HEADED AND CRNA SAID ANTIBIOTIC MAY BE GOING IN IN TO FAST-RATE DECREASED AND PTS SYMPTOMS RESOLVED IMMEDIATELY  . Depression   . GERD (gastroesophageal reflux disease)   . Shortness of breath dyspnea     PAST SURGICAL HISTORY :   Past Surgical History:  Procedure Laterality Date  . ABDOMINAL HYSTERECTOMY  2001  . BACK SURGERY     FOR SCOLIOSIS-HERRINGTON RODS IN PLACE  . BREAST BIOPSY Bilateral    08/17/2015   .  PORTACATH PLACEMENT N/A 08/30/2015   Procedure: INSERTION PORT-A-CATH;  Surgeon: Leonie Green, MD;  Location: [facility] ORS;  Service: General;  Laterality: N/A;  . SENTINEL NODE BIOPSY Bilateral 03/08/2016   Procedure: SENTINEL NODE BIOPSY;  Surgeon: Leonie Green, MD;  Location: [facility] ORS;  Service: General;  Laterality: Bilateral;  . SIMPLE MASTECTOMY WITH AXILLARY SENTINEL NODE BIOPSY Bilateral 03/08/2016   Procedure: SIMPLE MASTECTOMY;  Surgeon: Leonie Green, MD;  Location: [facility] ORS;  Service: General;  Laterality: Bilateral;  . TONSILLECTOMY      FAMILY HISTORY :   Family History  Problem Relation Age of Onset  . Breast cancer Maternal Aunt 64  . Cancer Father   . Cancer Sister   . Cancer Maternal Grandfather     SOCIAL HISTORY:   Social History  Substance Use Topics  . Smoking status: Former Smoker    Packs/day: 1.00    Years: 10.00    Types: Cigarettes    Quit date: 10/16/1999  . Smokeless tobacco: Never Used  . Alcohol use Yes     Comment: RARE WINE    ALLERGIES:  is allergic to ceftin [cefuroxime axetil].  MEDICATIONS:  Current Outpatient Prescriptions  Medication Sig Dispense Refill  . acetaminophen (TYLENOL) 500 MG tablet Take 1,000 mg by mouth every 6 (six) hours as needed.    . ALPRAZolam (XANAX) 0.25 MG tablet Take 1/4 tablet up to three times daily QHS    .  Dexlansoprazole (DEXILANT) 30 MG capsule Take 30 mg by mouth as needed.     . diphenhydrAMINE (SOMINEX) 25 MG tablet Take 25 mg by mouth at bedtime. Reported on 01/12/2016    . HYDROcodone-acetaminophen (NORCO/VICODIN) 5-325 MG tablet Take 1-2 tablets by mouth every 4 (four) hours as needed for moderate pain. 16 tablet 0  . lidocaine-prilocaine (EMLA) cream Apply 1 application topically as needed. Apply at least 1-2 hours before the port is to be used. 30 g 1  . LORazepam (ATIVAN) 0.5 MG tablet Take 1 tablet (0.5 mg total) by mouth every 6 (six) hours as needed (Nausea or vomiting). 30 tablet 0   . Multiple Vitamin (MULTIVITAMIN) capsule Take 1 capsule by mouth daily.    . silver sulfADIAZINE (SILVADENE) 1 % cream Apply 1 application topically 2 (two) times daily. 50 g 2  . tamoxifen (NOLVADEX) 20 MG tablet Take 1 tablet (20 mg total) by mouth daily. 30 tablet 5  . venlafaxine XR (EFFEXOR-XR) 37.5 MG 24 hr capsule Take 1 capsule (37.5 mg total) by mouth daily with breakfast. 30 capsule 3   No current facility-administered medications for this visit.     PHYSICAL EXAMINATION: ECOG PERFORMANCE STATUS: 0 - Asymptomatic  BP (!) 141/86 (BP Location: Left Arm, Patient Position: Sitting)   Pulse 80   Temp (!) 96.1 F (35.6 C) (Tympanic)   Resp 18   Ht '5\' 1"'  (1.549 m)   Wt 113 lb 9.6 oz (51.5 kg)   BMI 21.46 kg/m   Filed Weights   07/26/16 0852  Weight: 113 lb 9.6 oz (51.5 kg)    GENERAL: Well-nourished well-developed; Alert, no distress and comfortable.  She is accompanied by her husband. EYES: no pallor or icterus OROPHARYNX: no thrush or ulceration; good dentition  NECK: supple, no masses felt LYMPH:  no palpable lymphadenopathy in the cervical, axillary or inguinal regions LUNGS: clear to auscultation and  No wheeze or crackles HEART/CVS: regular rate & rhythm and no murmurs; No lower extremity edema ABDOMEN:abdomen soft, non-tender and normal bowel sounds Musculoskeletal:no cyanosis of digits and no clubbing  PSYCH: alert & oriented x 3 with fluent speech NEURO: no focal motor/sensory deficits SKIN:  no rashes or significant lesions  LABORATORY DATA:  I have reviewed the data as listed    Component Value Date/Time   NA 138 07/05/2016 1322   K 3.9 07/05/2016 1322   CL 106 07/05/2016 1322   CO2 25 07/05/2016 1322   GLUCOSE 136 (H) 07/05/2016 1322   BUN 13 07/05/2016 1322   CREATININE 0.67 07/05/2016 1322   CALCIUM 9.2 07/05/2016 1322   PROT 6.8 07/26/2016 0830   ALBUMIN 4.1 07/26/2016 0830   AST 25 07/26/2016 0830   ALT 16 07/26/2016 0830   ALKPHOS 59  07/26/2016 0830   BILITOT 0.9 07/26/2016 0830   GFRNONAA >60 07/05/2016 1322   GFRAA >60 07/05/2016 1322    No results found for: SPEP, UPEP  Lab Results  Component Value Date   WBC 5.4 07/26/2016   NEUTROABS 4.3 07/26/2016   HGB 11.9 (L) 07/26/2016   HCT 34.7 (L) 07/26/2016   MCV 89.3 07/26/2016   PLT 267 07/26/2016      Chemistry      Component Value Date/Time   NA 138 07/05/2016 1322   K 3.9 07/05/2016 1322   CL 106 07/05/2016 1322   CO2 25 07/05/2016 1322   BUN 13 07/05/2016 1322   CREATININE 0.67 07/05/2016 1322      Component Value  Date/Time   CALCIUM 9.2 07/05/2016 1322   ALKPHOS 59 07/26/2016 0830   AST 25 07/26/2016 0830   ALT 16 07/26/2016 0830   BILITOT 0.9 07/26/2016 0830       RADIOGRAPHIC STUDIES: I have personally reviewed the radiological images as listed and agreed with the findings in the report. No results found.   ASSESSMENT & PLAN:  Carcinoma of overlapping sites of right breast in female, estrogen receptor positive ([place]) ER/PR positive HER-2/neu positive breast cancer with complete response post neoadjuvant chemotherapy; currently on adjuvant Herceptin; patient status post radiation.  # Tolerating Herceptin well; proceed with treatment today. The estimated ejection   fraction was in the range of 55% to 60% [Aug 11th]; will need Echo prior to next treatment.   # On tamoxifen- side effects- hot-flashes/ mood swings/irritability/fatigue-- recommend Effexor 37.41m/day  # PN-1- monitor for now.   # follow up in 3 weeks/ labs/ herceptin; wil need 2 d echo at next visit.    No orders of the defined types were placed in this encounter.  All questions were answered. The patient knows to call the clinic with any problems, questions or concerns.      GCammie Sickle MD 07/29/2016 11:42 AM

## 2016-07-27 ENCOUNTER — Other Ambulatory Visit: Payer: Self-pay

## 2016-07-27 DIAGNOSIS — C50811 Malignant neoplasm of overlapping sites of right female breast: Secondary | ICD-10-CM

## 2016-07-30 ENCOUNTER — Other Ambulatory Visit: Payer: Self-pay | Admitting: Hematology and Oncology

## 2016-08-13 DIAGNOSIS — Z853 Personal history of malignant neoplasm of breast: Secondary | ICD-10-CM | POA: Diagnosis not present

## 2016-08-16 ENCOUNTER — Inpatient Hospital Stay: Payer: 59 | Attending: Hematology and Oncology

## 2016-08-16 ENCOUNTER — Encounter: Payer: Self-pay | Admitting: Hematology and Oncology

## 2016-08-16 ENCOUNTER — Inpatient Hospital Stay (HOSPITAL_BASED_OUTPATIENT_CLINIC_OR_DEPARTMENT_OTHER): Payer: 59 | Admitting: Hematology and Oncology

## 2016-08-16 ENCOUNTER — Other Ambulatory Visit: Payer: Self-pay | Admitting: Hematology and Oncology

## 2016-08-16 ENCOUNTER — Other Ambulatory Visit: Payer: Self-pay | Admitting: *Deleted

## 2016-08-16 ENCOUNTER — Inpatient Hospital Stay: Payer: 59

## 2016-08-16 VITALS — BP 131/67 | HR 85 | Temp 98.2°F | Resp 18 | Wt 110.7 lb

## 2016-08-16 DIAGNOSIS — R200 Anesthesia of skin: Secondary | ICD-10-CM

## 2016-08-16 DIAGNOSIS — F419 Anxiety disorder, unspecified: Secondary | ICD-10-CM | POA: Diagnosis not present

## 2016-08-16 DIAGNOSIS — C50811 Malignant neoplasm of overlapping sites of right female breast: Secondary | ICD-10-CM

## 2016-08-16 DIAGNOSIS — R0602 Shortness of breath: Secondary | ICD-10-CM | POA: Insufficient documentation

## 2016-08-16 DIAGNOSIS — Z79810 Long term (current) use of selective estrogen receptor modulators (SERMs): Secondary | ICD-10-CM | POA: Diagnosis not present

## 2016-08-16 DIAGNOSIS — Z87891 Personal history of nicotine dependence: Secondary | ICD-10-CM | POA: Insufficient documentation

## 2016-08-16 DIAGNOSIS — R5383 Other fatigue: Secondary | ICD-10-CM

## 2016-08-16 DIAGNOSIS — Z170 Estrogen receptor positive status [ER+]: Secondary | ICD-10-CM | POA: Diagnosis not present

## 2016-08-16 DIAGNOSIS — F39 Unspecified mood [affective] disorder: Secondary | ICD-10-CM | POA: Insufficient documentation

## 2016-08-16 DIAGNOSIS — G629 Polyneuropathy, unspecified: Secondary | ICD-10-CM | POA: Diagnosis not present

## 2016-08-16 DIAGNOSIS — C773 Secondary and unspecified malignant neoplasm of axilla and upper limb lymph nodes: Secondary | ICD-10-CM

## 2016-08-16 DIAGNOSIS — Z862 Personal history of diseases of the blood and blood-forming organs and certain disorders involving the immune mechanism: Secondary | ICD-10-CM

## 2016-08-16 DIAGNOSIS — Z5112 Encounter for antineoplastic immunotherapy: Secondary | ICD-10-CM | POA: Insufficient documentation

## 2016-08-16 DIAGNOSIS — Z8041 Family history of malignant neoplasm of ovary: Secondary | ICD-10-CM | POA: Insufficient documentation

## 2016-08-16 DIAGNOSIS — R202 Paresthesia of skin: Secondary | ICD-10-CM | POA: Diagnosis not present

## 2016-08-16 DIAGNOSIS — F329 Major depressive disorder, single episode, unspecified: Secondary | ICD-10-CM

## 2016-08-16 DIAGNOSIS — Z809 Family history of malignant neoplasm, unspecified: Secondary | ICD-10-CM

## 2016-08-16 DIAGNOSIS — Z87440 Personal history of urinary (tract) infections: Secondary | ICD-10-CM | POA: Insufficient documentation

## 2016-08-16 DIAGNOSIS — Z9221 Personal history of antineoplastic chemotherapy: Secondary | ICD-10-CM | POA: Diagnosis not present

## 2016-08-16 DIAGNOSIS — K219 Gastro-esophageal reflux disease without esophagitis: Secondary | ICD-10-CM | POA: Insufficient documentation

## 2016-08-16 DIAGNOSIS — R232 Flushing: Secondary | ICD-10-CM

## 2016-08-16 DIAGNOSIS — Z9013 Acquired absence of bilateral breasts and nipples: Secondary | ICD-10-CM

## 2016-08-16 DIAGNOSIS — Z803 Family history of malignant neoplasm of breast: Secondary | ICD-10-CM | POA: Diagnosis not present

## 2016-08-16 DIAGNOSIS — C50111 Malignant neoplasm of central portion of right female breast: Secondary | ICD-10-CM

## 2016-08-16 DIAGNOSIS — Z79899 Other long term (current) drug therapy: Secondary | ICD-10-CM | POA: Diagnosis not present

## 2016-08-16 DIAGNOSIS — T451X5A Adverse effect of antineoplastic and immunosuppressive drugs, initial encounter: Secondary | ICD-10-CM

## 2016-08-16 LAB — CBC WITH DIFFERENTIAL/PLATELET
BASOS ABS: 0 10*3/uL (ref 0–0.1)
BASOS PCT: 1 %
Eosinophils Absolute: 0.1 10*3/uL (ref 0–0.7)
Eosinophils Relative: 3 %
HEMATOCRIT: 33.5 % — AB (ref 35.0–47.0)
Hemoglobin: 11.3 g/dL — ABNORMAL LOW (ref 12.0–16.0)
LYMPHS PCT: 16 %
Lymphs Abs: 0.8 10*3/uL — ABNORMAL LOW (ref 1.0–3.6)
MCH: 30.7 pg (ref 26.0–34.0)
MCHC: 33.8 g/dL (ref 32.0–36.0)
MCV: 90.8 fL (ref 80.0–100.0)
MONO ABS: 0.3 10*3/uL (ref 0.2–0.9)
Monocytes Relative: 7 %
NEUTROS ABS: 3.6 10*3/uL (ref 1.4–6.5)
NEUTROS PCT: 73 %
PLATELETS: 278 10*3/uL (ref 150–440)
RBC: 3.69 MIL/uL — AB (ref 3.80–5.20)
RDW: 13.5 % (ref 11.5–14.5)
WBC: 4.9 10*3/uL (ref 3.6–11.0)

## 2016-08-16 LAB — COMPREHENSIVE METABOLIC PANEL
ALBUMIN: 3.9 g/dL (ref 3.5–5.0)
ALT: 19 U/L (ref 14–54)
AST: 23 U/L (ref 15–41)
Alkaline Phosphatase: 52 U/L (ref 38–126)
Anion gap: 4 — ABNORMAL LOW (ref 5–15)
BILIRUBIN TOTAL: 0.5 mg/dL (ref 0.3–1.2)
BUN: 16 mg/dL (ref 6–20)
CO2: 26 mmol/L (ref 22–32)
CREATININE: 0.77 mg/dL (ref 0.44–1.00)
Calcium: 8.5 mg/dL — ABNORMAL LOW (ref 8.9–10.3)
Chloride: 106 mmol/L (ref 101–111)
GFR calc Af Amer: >60 mL/min (ref >60)
GFR calc non Af Amer: >60 mL/min (ref >60)
GLUCOSE: 113 mg/dL — AB (ref 65–99)
POTASSIUM: 4 mmol/L (ref 3.5–5.1)
Sodium: 136 mmol/L (ref 135–145)
Total Protein: 6.4 g/dL — ABNORMAL LOW (ref 6.5–8.1)

## 2016-08-16 MED ORDER — SODIUM CHLORIDE 0.9% FLUSH
10.0000 mL | INTRAVENOUS | Status: DC | PRN
  Administered 2016-08-16: 10 mL
  Filled 2016-08-16: qty 10

## 2016-08-16 MED ORDER — ACETAMINOPHEN 325 MG PO TABS
650.0000 mg | ORAL_TABLET | Freq: Once | ORAL | Status: AC
  Administered 2016-08-16: 650 mg via ORAL
  Filled 2016-08-16: qty 2

## 2016-08-16 MED ORDER — HEPARIN SOD (PORK) LOCK FLUSH 100 UNIT/ML IV SOLN
500.0000 [IU] | Freq: Once | INTRAVENOUS | Status: AC | PRN
  Administered 2016-08-16: 500 [IU]
  Filled 2016-08-16: qty 5

## 2016-08-16 MED ORDER — TRASTUZUMAB CHEMO INJECTION 440 MG
294.0000 mg | Freq: Once | INTRAVENOUS | Status: AC
  Administered 2016-08-16: 294 mg via INTRAVENOUS
  Filled 2016-08-16: qty 14

## 2016-08-16 MED ORDER — SODIUM CHLORIDE 0.9 % IV SOLN
Freq: Once | INTRAVENOUS | Status: AC
  Administered 2016-08-16: 10:00:00 via INTRAVENOUS
  Filled 2016-08-16: qty 1000

## 2016-08-16 MED ORDER — DIPHENHYDRAMINE HCL 25 MG PO CAPS
25.0000 mg | ORAL_CAPSULE | Freq: Once | ORAL | Status: AC
  Administered 2016-08-16: 25 mg via ORAL
  Filled 2016-08-16: qty 1

## 2016-08-16 NOTE — Progress Notes (Signed)
[facility] day:  08/16/16   Chief Complaint: Emma Atkinson is a 50 y.o. female with clinical stage IIB Her2/neu+ right breast cancer who is seen for assessment on Tamoxifen and continuation of every 3 week Herceptin.  HPI:  The patient was last seen in the medical oncology clinic by me on 07/05/2016.  At that time, began tamoxifen.  She was seen by Dr. Rogue Bussing on 08/04/2016.  She noted hot flashes, mood swings, fatigue, and mild tingling and numbness in her feet.  She was begun on Effexor XR 37.5 mg a day.  She states that the Effexor has helped her a lot.  Initially, she had sent to take some Zofran for nausea associated with the Effexor. She states that she is now gotten used to it.  She has met with plastic surgery. She is considering reconstruction beginning at the end of December or the first part of January.   Past Medical History:  Diagnosis Date  . Anemia    H/O  . Anxiety   . Breast cancer ([place])   . Chronic kidney disease 12-2015   PYLONEPHRITIS  . Complication of anesthesia    HARD TO WAKE UP AFTER TONSILLECTOMY AS A CHILD BUT NO PROBLEMS SINCE  . Complication of anesthesia    PT STATES THAT BACK IN OR DURING PORT PLACMENT IN 2016 THAT SHE BECAME VEERY FLUSHED, LIGHT HEADED AND CRNA SAID ANTIBIOTIC MAY BE GOING IN IN TO FAST-RATE DECREASED AND PTS SYMPTOMS RESOLVED IMMEDIATELY  . Depression   . GERD (gastroesophageal reflux disease)   . Shortness of breath dyspnea     Past Surgical History:  Procedure Laterality Date  . ABDOMINAL HYSTERECTOMY  2001  . BACK SURGERY     FOR SCOLIOSIS-HERRINGTON RODS IN PLACE  . BREAST BIOPSY Bilateral    08/17/2015   . PORTACATH PLACEMENT N/A 08/30/2015   Procedure: INSERTION PORT-A-CATH;  Surgeon: Leonie Green, MD;  Location: [facility] ORS;  Service: General;  Laterality: N/A;  . SENTINEL NODE BIOPSY Bilateral 03/08/2016   Procedure: SENTINEL NODE BIOPSY;  Surgeon: Leonie Green,  MD;  Location: [facility] ORS;  Service: General;  Laterality: Bilateral;  . SIMPLE MASTECTOMY WITH AXILLARY SENTINEL NODE BIOPSY Bilateral 03/08/2016   Procedure: SIMPLE MASTECTOMY;  Surgeon: Leonie Green, MD;  Location: [facility] ORS;  Service: General;  Laterality: Bilateral;  . TONSILLECTOMY      Family History  Problem Relation Age of Onset  . Breast cancer Maternal Aunt 64  . Cancer Father   . Cancer Sister   . Cancer Maternal Grandfather     Social History:  reports that she quit smoking about 16 years ago. Her smoking use included Cigarettes. She has a 10.00 pack-year smoking history. She has never used smokeless tobacco. She reports that she drinks alcohol. She reports that she does not use drugs.  She is working part time.  The patient is alone today.  Allergies:  Allergies  Allergen Reactions  . Ceftin [Cefuroxime Axetil] Rash    Current Medications: Current Outpatient Prescriptions  Medication Sig Dispense Refill  . acetaminophen (TYLENOL) 500 MG tablet Take 1,000 mg by mouth every 6 (six) hours as needed.    . ALPRAZolam (XANAX) 0.25 MG tablet Take 1/4 tablet up to three times daily QHS    . Dexlansoprazole (DEXILANT) 30 MG capsule Take 30 mg by mouth as needed.     . diphenhydrAMINE (SOMINEX) 25 MG tablet Take 25 mg by mouth at  bedtime. Reported on 01/12/2016    . HYDROcodone-acetaminophen (NORCO/VICODIN) 5-325 MG tablet Take 1-2 tablets by mouth every 4 (four) hours as needed for moderate pain. 16 tablet 0  . lidocaine-prilocaine (EMLA) cream Apply 1 application topically as needed. Apply at least 1-2 hours before the port is to be used. 30 g 1  . Multiple Vitamin (MULTIVITAMIN) capsule Take 1 capsule by mouth daily.    . tamoxifen (NOLVADEX) 20 MG tablet Take 1 tablet (20 mg total) by mouth daily. 30 tablet 5  . venlafaxine XR (EFFEXOR-XR) 37.5 MG 24 hr capsule Take 1 capsule (37.5 mg total) by mouth daily with breakfast. 30 capsule 3   No current facility-administered  medications for this visit.     Review of Systems:  GENERAL:  Feeling good.  No fevers or sweats.  Weight stable. PERFORMANCE STATUS (ECOG):  1 HEENT:  No visual changes, runny nose, sore throat, mouth sores or tenderness. Lungs: No shortness of breath or cough.  No pleuritic chest pain.  No hemoptysis. Cardiac:  No chest pain, palpitations, orthopnea, or PND. GI:  No nausea, vomiting, diarrhea, constipation, melena or hematochezia. GU:  No urgency, frequency,  Dysuria or hematuria.  Musculoskeletal:  No back pain.  No joint pain.  No muscle tenderness. Extremities:  No pain or swelling. Skin:  No rashes or skin changes. Neuro:  Minimal numbness/tingling in tips of fingers.  No weakness, balance or coordination issues. Endocrine:  Mild hot flashes.  No diabetes, thyroid issues, or night sweats. Psych:  Poor sleep.  No mood changes. Pain:  No focal pain. Review of systems:  All other systems reviewed and found to be negative.  Physical Exam: Blood pressure 131/67, pulse 85, temperature 98.2 F (36.8 C), temperature source Tympanic, resp. rate 18, weight 110 lb 10.7 oz (50.2 kg). O2 sats 94% with ambulation. GENERAL:  Thin tan woman sitting comfortably in the exam room in no acute distress.  MENTAL STATUS:  Alert and oriented to person, place and time. HEAD:  Short styled hair.  Normocephalic, atraumatic, face symmetric (thin), no Cushingoid features. EYES:  Blue eyes.  Pupils equal round and reactive to light and accomodation.  No conjunctivitis or scleral icterus. ENT:  Oropharynx clear without lesion.  Tongue normal. Mucous membranes moist.  RESPIRATORY:  Clear to auscultation without rales, wheezes or rhonchi. CARDIOVASCULAR:  Regular rate and rhythm without murmur, rub or gallop. No JVD. BREASTS:  Chest wall without erythema or nodularity. ABDOMEN:  Soft, non-tender, with active bowel sounds, and no hepatosplenomegaly.  No masses. SKIN:  Tan.  No rashes, ulcers or  lesions. EXTREMITIES: No edema, no skin discoloration or tenderness.  No palpable cords. LYMPH NODES: No palpable cervical, supraclavicular, axillary or inguinal adenopathy  NEUROLOGICAL: Unremarkable. PSYCH:  Appropriate.    Office Visit on 08/16/2016  Component Date Value Ref Range Status  . WBC 08/16/2016 4.9  3.6 - 11.0 K/uL Final  . RBC 08/16/2016 3.69* 3.80 - 5.20 MIL/uL Final  . Hemoglobin 08/16/2016 11.3* 12.0 - 16.0 g/dL Final  . HCT 08/16/2016 33.5* 35.0 - 47.0 % Final  . MCV 08/16/2016 90.8  80.0 - 100.0 fL Final  . MCH 08/16/2016 30.7  26.0 - 34.0 pg Final  . MCHC 08/16/2016 33.8  32.0 - 36.0 g/dL Final  . RDW 08/16/2016 13.5  11.5 - 14.5 % Final  . Platelets 08/16/2016 278  150 - 440 K/uL Final  . Neutrophils Relative % 08/16/2016 73  % Final  . Neutro Abs 08/16/2016 3.6  1.4 - 6.5 K/uL Final  . Lymphocytes Relative 08/16/2016 16  % Final  . Lymphs Abs 08/16/2016 0.8* 1.0 - 3.6 K/uL Final  . Monocytes Relative 08/16/2016 7  % Final  . Monocytes Absolute 08/16/2016 0.3  0.2 - 0.9 K/uL Final  . Eosinophils Relative 08/16/2016 3  % Final  . Eosinophils Absolute 08/16/2016 0.1  0 - 0.7 K/uL Final  . Basophils Relative 08/16/2016 1  % Final  . Basophils Absolute 08/16/2016 0.0  0 - 0.1 K/uL Final  . Sodium 08/16/2016 136  135 - 145 mmol/L Final  . Potassium 08/16/2016 4.0  3.5 - 5.1 mmol/L Final  . Chloride 08/16/2016 106  101 - 111 mmol/L Final  . CO2 08/16/2016 26  22 - 32 mmol/L Final  . Glucose, Bld 08/16/2016 113* 65 - 99 mg/dL Final  . BUN 08/16/2016 16  6 - 20 mg/dL Final  . Creatinine, Ser 08/16/2016 0.77  0.44 - 1.00 mg/dL Final  . Calcium 08/16/2016 8.5* 8.9 - 10.3 mg/dL Final  . Total Protein 08/16/2016 6.4* 6.5 - 8.1 g/dL Final  . Albumin 08/16/2016 3.9  3.5 - 5.0 g/dL Final  . AST 08/16/2016 23  15 - 41 U/L Final  . ALT 08/16/2016 19  14 - 54 U/L Final  . Alkaline Phosphatase 08/16/2016 52  38 - 126 U/L Final  . Total Bilirubin 08/16/2016 0.5  0.3 - 1.2  mg/dL Final  . GFR calc non Af Amer 08/16/2016 >60  >60 mL/min Final  . GFR calc Af Amer 08/16/2016 >60  >60 mL/min Final   Comment: (NOTE) The eGFR has been calculated using the CKD EPI equation. This calculation has not been validated in all clinical situations. eGFR's persistently <60 mL/min signify possible Chronic Kidney Disease.   . Anion gap 08/16/2016 4* 5 - 15 Final    Assessment:  LICHELLE VIETS is a 50 y.o. female with clinical stage T2N1 (stage IIB) Her2/neu + right breast cancer s/p neoadjuvant chemotherapy and bilateral mastectomy.  She is currently receiving Herceptin every 3 weeks.  She presented with a minimally painful right breast mass with nipple inversion.  Bilateral mammogram and ultrasound on 08/12/2015 revealed a 4.1 cm right superior breast mass containing pleomorphic microcalcifications. There were several satellite lesions (1.3 cm, 0.9 cm, 0.6 cm) plus a 1.3 cm mass versus complicated cyst and a 0.8 cm cyst.  There was one 5 mm right axillary node with irregular thickened cortex.   Core needle biopsy on 08/17/2015 of the right breast mass revealed a grade 3 invasive mammary carcinoma. Right axillary biopsy confirmed metastatic mammary carcinoma. Tumor was ER positive (1-10%), PR positive (11-50%) and HER-2/neu 3+  PET scan on 08/26/2015 revealed hypermetabolic right breast mass (SUV 11.3) with a hypermetabolic right axillary lymph node and a subtle focus of very faint hypermetabolic activity laterally in the right breast. There was faint hypermetabolic activity at the biopsy site of the left breast (? biopsy related).    CA27.29 was 19.6 on 08/22/2015, 24.7 on 01/10/2016, and 15.5 on 03/23/2016.  She received 4 cycles of AC (09/13/2015 - 11/08/2015) with Neulasta support.  She received 4 cycles of Herceptin and Perjeta (11/29/2015 - 01/31/2016) and 12 weeks of Taxol (11/29/2015 - 02/14/2016).  She tolerated her chemotherapy well.  She developed a grade I neuropathy.   She receives Herceptin every 3 weeks (last 08/04/2016).  She began tamoxifen on 07/05/2016.  She is on Effexor for vasomotor symptoms.  Bilateral mastectomy on 03/08/2016 revealed no residual  disease in the right breast.  Five lymph nodes were negative for malignancy.  Pathologic stage was ypT0 ypN0.  Left breast revealed benign intraductal papillomas and cysts adjacent to the biopsy site.  There was no atypia or malignancy.  She is considering reconstruction in 09/2016 or 10/2016.  She received 50.4 Gy right chest wall radiation from 05/07/2016 - 06/25/2016.  She is planning reconstruction at the end of 09/2016 or beginning of 10/2016.  She is s/p partial hysterectomy (ovaries remain) in 2001.  She was premenopausal ([place] 5 and estradiol 271.9) on 08/22/2015.  Echo on 08/26/2015 revealed an ejection fraction of 55-60%.  Echo on 11/25/2015 revealed an EF of 60-65%.  Echo on 02/23/2016 and 05/25/2016 revealed an EF of 55-65%.  She has a family history of breast and ovarian cancer.  My Risk genetic testing revealed no mutation on 08/22/2015.  Symptomatically, she feels good.  Vasomotor symptoms have improved on Effexor.  Exam is unremarkable.  Plan: 1.  Labs today:  CBC with diff, CMP. 2.  Herceptin today.   3.  Schedule follow-up echo on 08/31/2016. 4.  Discuss consideration of neratinib post completion of Herceptin. 5.  Continue tamoxifen. 6.  Continue Effexor.  Contact clinic if any dose adjustments needed (patient considering). 7.  RTC on 11/30 for Herceptin 8.  RTC on 10/04/2016 for MD assessment, labs (CBC with diff, CMP, Mg) and Herceptin.   Lequita Asal, MD  08/16/2016, 9:43 AM

## 2016-08-18 DIAGNOSIS — R232 Flushing: Secondary | ICD-10-CM | POA: Insufficient documentation

## 2016-08-18 DIAGNOSIS — T451X5A Adverse effect of antineoplastic and immunosuppressive drugs, initial encounter: Secondary | ICD-10-CM | POA: Insufficient documentation

## 2016-08-31 ENCOUNTER — Ambulatory Visit: Payer: 59

## 2016-08-31 ENCOUNTER — Ambulatory Visit
Admission: RE | Admit: 2016-08-31 | Discharge: 2016-08-31 | Disposition: A | Discharge status: Home / Self Care | Payer: 59 | Source: Ambulatory Visit | Attending: Hematology and Oncology | Admitting: Hematology and Oncology

## 2016-08-31 DIAGNOSIS — C50111 Malignant neoplasm of central portion of right female breast: Secondary | ICD-10-CM | POA: Diagnosis not present

## 2016-08-31 DIAGNOSIS — M7022 Olecranon bursitis, left elbow: Secondary | ICD-10-CM | POA: Diagnosis not present

## 2016-08-31 DIAGNOSIS — Z170 Estrogen receptor positive status [ER+]: Secondary | ICD-10-CM

## 2016-08-31 NOTE — Progress Notes (Signed)
  Echocardiogram 2D Echocardiogram has been performed.  Darlina Sicilian M 08/31/2016, 10:23 AM

## 2016-09-02 DIAGNOSIS — R0689 Other abnormalities of breathing: Secondary | ICD-10-CM | POA: Diagnosis not present

## 2016-09-12 DIAGNOSIS — C50919 Malignant neoplasm of unspecified site of unspecified female breast: Secondary | ICD-10-CM | POA: Diagnosis not present

## 2016-09-12 DIAGNOSIS — Z9013 Acquired absence of bilateral breasts and nipples: Secondary | ICD-10-CM | POA: Diagnosis not present

## 2016-09-13 ENCOUNTER — Other Ambulatory Visit: Payer: Self-pay | Admitting: Hematology and Oncology

## 2016-09-13 ENCOUNTER — Inpatient Hospital Stay: Payer: 59

## 2016-09-13 VITALS — BP 161/83 | HR 85 | Resp 20

## 2016-09-13 DIAGNOSIS — Z170 Estrogen receptor positive status [ER+]: Secondary | ICD-10-CM | POA: Diagnosis not present

## 2016-09-13 DIAGNOSIS — Z5112 Encounter for antineoplastic immunotherapy: Secondary | ICD-10-CM | POA: Diagnosis not present

## 2016-09-13 DIAGNOSIS — C773 Secondary and unspecified malignant neoplasm of axilla and upper limb lymph nodes: Secondary | ICD-10-CM | POA: Diagnosis not present

## 2016-09-13 DIAGNOSIS — R232 Flushing: Secondary | ICD-10-CM | POA: Diagnosis not present

## 2016-09-13 DIAGNOSIS — G629 Polyneuropathy, unspecified: Secondary | ICD-10-CM | POA: Diagnosis not present

## 2016-09-13 DIAGNOSIS — Z79810 Long term (current) use of selective estrogen receptor modulators (SERMs): Secondary | ICD-10-CM | POA: Diagnosis not present

## 2016-09-13 DIAGNOSIS — F39 Unspecified mood [affective] disorder: Secondary | ICD-10-CM | POA: Diagnosis not present

## 2016-09-13 DIAGNOSIS — C50111 Malignant neoplasm of central portion of right female breast: Secondary | ICD-10-CM

## 2016-09-13 DIAGNOSIS — R5383 Other fatigue: Secondary | ICD-10-CM | POA: Diagnosis not present

## 2016-09-13 DIAGNOSIS — C50811 Malignant neoplasm of overlapping sites of right female breast: Secondary | ICD-10-CM | POA: Diagnosis not present

## 2016-09-13 MED ORDER — HEPARIN SOD (PORK) LOCK FLUSH 100 UNIT/ML IV SOLN
500.0000 [IU] | Freq: Once | INTRAVENOUS | Status: AC | PRN
  Administered 2016-09-13: 500 [IU]
  Filled 2016-09-13: qty 5

## 2016-09-13 MED ORDER — TRASTUZUMAB CHEMO 150 MG IV SOLR
294.0000 mg | Freq: Once | INTRAVENOUS | Status: AC
  Administered 2016-09-13: 294 mg via INTRAVENOUS
  Filled 2016-09-13: qty 14

## 2016-09-13 MED ORDER — ACETAMINOPHEN 325 MG PO TABS
650.0000 mg | ORAL_TABLET | Freq: Once | ORAL | Status: AC
  Administered 2016-09-13: 650 mg via ORAL
  Filled 2016-09-13: qty 2

## 2016-09-13 MED ORDER — SODIUM CHLORIDE 0.9 % IV SOLN
Freq: Once | INTRAVENOUS | Status: AC
  Administered 2016-09-13: 09:00:00 via INTRAVENOUS
  Filled 2016-09-13: qty 1000

## 2016-09-13 MED ORDER — DIPHENHYDRAMINE HCL 25 MG PO CAPS
25.0000 mg | ORAL_CAPSULE | Freq: Once | ORAL | Status: AC
  Administered 2016-09-13: 25 mg via ORAL
  Filled 2016-09-13: qty 1

## 2016-09-13 MED ORDER — SODIUM CHLORIDE 0.9% FLUSH
10.0000 mL | INTRAVENOUS | Status: DC | PRN
Start: 2016-09-13 — End: 2016-09-13
  Administered 2016-09-13: 10 mL
  Filled 2016-09-13: qty 10

## 2016-09-19 DIAGNOSIS — Z853 Personal history of malignant neoplasm of breast: Secondary | ICD-10-CM | POA: Diagnosis not present

## 2016-10-02 ENCOUNTER — Other Ambulatory Visit: Payer: Self-pay | Admitting: Hematology and Oncology

## 2016-10-04 ENCOUNTER — Inpatient Hospital Stay: Payer: 59

## 2016-10-04 ENCOUNTER — Inpatient Hospital Stay: Payer: 59 | Attending: Oncology

## 2016-10-04 ENCOUNTER — Inpatient Hospital Stay (HOSPITAL_BASED_OUTPATIENT_CLINIC_OR_DEPARTMENT_OTHER): Payer: 59 | Admitting: Oncology

## 2016-10-04 ENCOUNTER — Encounter: Payer: Self-pay | Admitting: Oncology

## 2016-10-04 VITALS — BP 150/85 | HR 97 | Temp 98.1°F | Wt 110.7 lb

## 2016-10-04 DIAGNOSIS — F329 Major depressive disorder, single episode, unspecified: Secondary | ICD-10-CM

## 2016-10-04 DIAGNOSIS — K219 Gastro-esophageal reflux disease without esophagitis: Secondary | ICD-10-CM | POA: Diagnosis not present

## 2016-10-04 DIAGNOSIS — D649 Anemia, unspecified: Secondary | ICD-10-CM | POA: Insufficient documentation

## 2016-10-04 DIAGNOSIS — Z79899 Other long term (current) drug therapy: Secondary | ICD-10-CM | POA: Insufficient documentation

## 2016-10-04 DIAGNOSIS — Z8041 Family history of malignant neoplasm of ovary: Secondary | ICD-10-CM | POA: Diagnosis not present

## 2016-10-04 DIAGNOSIS — N951 Menopausal and female climacteric states: Secondary | ICD-10-CM | POA: Diagnosis not present

## 2016-10-04 DIAGNOSIS — R0602 Shortness of breath: Secondary | ICD-10-CM

## 2016-10-04 DIAGNOSIS — G629 Polyneuropathy, unspecified: Secondary | ICD-10-CM

## 2016-10-04 DIAGNOSIS — C50111 Malignant neoplasm of central portion of right female breast: Secondary | ICD-10-CM

## 2016-10-04 DIAGNOSIS — Z170 Estrogen receptor positive status [ER+]: Principal | ICD-10-CM

## 2016-10-04 DIAGNOSIS — Z9221 Personal history of antineoplastic chemotherapy: Secondary | ICD-10-CM | POA: Insufficient documentation

## 2016-10-04 DIAGNOSIS — C50811 Malignant neoplasm of overlapping sites of right female breast: Secondary | ICD-10-CM

## 2016-10-04 DIAGNOSIS — Z809 Family history of malignant neoplasm, unspecified: Secondary | ICD-10-CM | POA: Diagnosis not present

## 2016-10-04 DIAGNOSIS — Z803 Family history of malignant neoplasm of breast: Secondary | ICD-10-CM | POA: Diagnosis not present

## 2016-10-04 DIAGNOSIS — Z9013 Acquired absence of bilateral breasts and nipples: Secondary | ICD-10-CM | POA: Insufficient documentation

## 2016-10-04 DIAGNOSIS — Z87891 Personal history of nicotine dependence: Secondary | ICD-10-CM | POA: Diagnosis not present

## 2016-10-04 DIAGNOSIS — Z79810 Long term (current) use of selective estrogen receptor modulators (SERMs): Secondary | ICD-10-CM | POA: Insufficient documentation

## 2016-10-04 LAB — COMPREHENSIVE METABOLIC PANEL
ALT: 17 U/L (ref 14–54)
AST: 22 U/L (ref 15–41)
Albumin: 3.9 g/dL (ref 3.5–5.0)
Alkaline Phosphatase: 60 U/L (ref 38–126)
Anion gap: 6 (ref 5–15)
BUN: 15 mg/dL (ref 6–20)
CO2: 25 mmol/L (ref 22–32)
Calcium: 8.7 mg/dL — ABNORMAL LOW (ref 8.9–10.3)
Chloride: 107 mmol/L (ref 101–111)
Creatinine, Ser: 0.67 mg/dL (ref 0.44–1.00)
GFR calc Af Amer: >60 mL/min (ref >60)
GFR calc non Af Amer: >60 mL/min (ref >60)
Glucose, Bld: 101 mg/dL — ABNORMAL HIGH (ref 65–99)
Potassium: 4 mmol/L (ref 3.5–5.1)
Sodium: 138 mmol/L (ref 135–145)
Total Bilirubin: 0.7 mg/dL (ref 0.3–1.2)
Total Protein: 6.6 g/dL (ref 6.5–8.1)

## 2016-10-04 LAB — CBC WITH DIFFERENTIAL/PLATELET
Basophils Absolute: 0 10*3/uL (ref 0–0.1)
Basophils Relative: 0 %
Eosinophils Absolute: 0.1 10*3/uL (ref 0–0.7)
Eosinophils Relative: 2 %
HCT: 35 % (ref 35.0–47.0)
Hemoglobin: 11.8 g/dL — ABNORMAL LOW (ref 12.0–16.0)
Lymphocytes Relative: 15 %
Lymphs Abs: 0.9 10*3/uL — ABNORMAL LOW (ref 1.0–3.6)
MCH: 31 pg (ref 26.0–34.0)
MCHC: 33.8 g/dL (ref 32.0–36.0)
MCV: 91.7 fL (ref 80.0–100.0)
Monocytes Absolute: 0.3 10*3/uL (ref 0.2–0.9)
Monocytes Relative: 6 %
Neutro Abs: 4.4 10*3/uL (ref 1.4–6.5)
Neutrophils Relative %: 77 %
Platelets: 285 10*3/uL (ref 150–440)
RBC: 3.82 MIL/uL (ref 3.80–5.20)
RDW: 13.8 % (ref 11.5–14.5)
WBC: 5.7 10*3/uL (ref 3.6–11.0)

## 2016-10-04 LAB — MAGNESIUM: Magnesium: 2.1 mg/dL (ref 1.7–2.4)

## 2016-10-04 MED ORDER — HEPARIN SOD (PORK) LOCK FLUSH 100 UNIT/ML IV SOLN
500.0000 [IU] | Freq: Once | INTRAVENOUS | Status: AC | PRN
  Administered 2016-10-04: 500 [IU]
  Filled 2016-10-04: qty 5

## 2016-10-04 MED ORDER — SODIUM CHLORIDE 0.9% FLUSH
10.0000 mL | INTRAVENOUS | Status: DC | PRN
  Administered 2016-10-04: 10 mL
  Filled 2016-10-04: qty 10

## 2016-10-04 MED ORDER — TRASTUZUMAB CHEMO 150 MG IV SOLR
294.0000 mg | Freq: Once | INTRAVENOUS | Status: AC
  Administered 2016-10-04: 294 mg via INTRAVENOUS
  Filled 2016-10-04: qty 14

## 2016-10-04 MED ORDER — SODIUM CHLORIDE 0.9 % IV SOLN
Freq: Once | INTRAVENOUS | Status: AC
  Administered 2016-10-04: 10:00:00 via INTRAVENOUS
  Filled 2016-10-04: qty 1000

## 2016-10-04 MED ORDER — ACETAMINOPHEN 325 MG PO TABS
650.0000 mg | ORAL_TABLET | Freq: Once | ORAL | Status: AC
  Administered 2016-10-04: 650 mg via ORAL
  Filled 2016-10-04: qty 2

## 2016-10-04 MED ORDER — DIPHENHYDRAMINE HCL 25 MG PO CAPS
25.0000 mg | ORAL_CAPSULE | Freq: Once | ORAL | Status: AC
  Administered 2016-10-04: 25 mg via ORAL
  Filled 2016-10-04: qty 1

## 2016-10-04 NOTE — Progress Notes (Signed)
Hematology/Oncology Consult note [facility]  [phone] [phone]  Patient Care Team: Idelle Crouch, MD as PCP - General (Internal Medicine) Leonie Green, MD as Referring Physician (Surgery)   Name of the patient: Emma Atkinson  [id number]  Aug 09, 1966   Date of visit: 10/04/16  Diagnosis- stage IIB ER/PR positive and HER-2/neu positive right breast cancer  Chief complaint/ Reason for visit- follow-up of breast cancer prior to Herceptin today  Heme/Onc history: Emma Atkinson is a 50 y.o. female with clinical stage T2N1 (stage IIB) Her2/neu + right breast cancer s/p neoadjuvant chemotherapy and bilateral mastectomy.  She is currently receiving Herceptin every 3 weeks.  She presented with a minimally painful right breast mass with nipple inversion.  Bilateral mammogram and ultrasound on 08/12/2015 revealed a 4.1 cm right superior breast mass containing pleomorphic microcalcifications. There were several satellite lesions (1.3 cm, 0.9 cm, 0.6 cm) plus a 1.3 cm mass versus complicated cyst and a 0.8 cm cyst.  There was one 5 mm right axillary node with irregular thickened cortex.   Core needle biopsy on 08/17/2015 of the right breast mass revealed a grade 3 invasive mammary carcinoma. Right axillary biopsy confirmed metastatic mammary carcinoma. Tumor was ER positive (1-10%), PR positive (11-50%) and HER-2/neu 3+  PET scan on 08/26/2015 revealed hypermetabolic right breast mass (SUV 11.3) with a hypermetabolic right axillary lymph node and a subtle focus of very faint hypermetabolic activity laterally in the right breast. There was faint hypermetabolic activity at the biopsy site of the left breast (? biopsy related).    CA27.29 was 19.6 on 08/22/2015, 24.7 on 01/10/2016, and 15.5 on 03/23/2016.  She received 4 cycles of AC (09/13/2015 - 11/08/2015) with Neulasta support.  She received 4 cycles of Herceptin and Perjeta (11/29/2015  - 01/31/2016) and 12 weeks of Taxol (11/29/2015 - 02/14/2016).  She tolerated her chemotherapy well.  She developed a grade I neuropathy.  She receives Herceptin every 3 weeks (last 08/04/2016).  She began tamoxifen on 07/05/2016.  She is on Effexor for vasomotor symptoms.  Bilateral mastectomy on 03/08/2016 revealed no residual disease in the right breast.  Five lymph nodes were negative for malignancy.  Pathologic stage was ypT0 ypN0.  Left breast revealed benign intraductal papillomas and cysts adjacent to the biopsy site.  There was no atypia or malignancy.  She is considering reconstruction in 09/2016 or 10/2016.  She received 50.4 Gy right chest wall radiation from 05/07/2016 - 06/25/2016.  She is planning reconstruction at the end of 09/2016 or beginning of 10/2016.  She is s/p partial hysterectomy (ovaries remain) in 2001.  She was premenopausal ([place] 5 and estradiol 271.9) on 08/22/2015.  Echo on 08/26/2015 revealed an ejection fraction of 55-60%.  Echo on 11/25/2015 revealed an EF of 60-65%.  Echo on 02/23/2016 and 05/25/2016 revealed an EF of 55-65%.  She has a family history of breast and ovarian cancer.  My Risk genetic testing revealed no mutation on 08/22/2015.  Interval history- overall patient is tolerating her Herceptin well. Denies any cough, shortness of breath or pedal edema. She does feel tired on the day of Herceptin which gradually resolved. Hot flashes and mood changes are stable on tamoxifen. She did meet with plastic surgeon from [facility] and is getting reconstruction surgery next week  ECOG PS- 0 Pain scale- 0 Opioid associated constipation- no  Review of systems- Review of Systems  Constitutional: Negative for chills, fever, malaise/fatigue and weight loss.  HENT: Negative for  congestion, ear discharge and nosebleeds.   Eyes: Negative for blurred vision.  Respiratory: Negative for cough, hemoptysis, sputum production, shortness of breath and wheezing.   Cardiovascular:  Negative for chest pain, palpitations, orthopnea and claudication.  Gastrointestinal: Negative for abdominal pain, blood in stool, constipation, diarrhea, heartburn, melena, nausea and vomiting.  Genitourinary: Negative for dysuria, flank pain, frequency, hematuria and urgency.  Musculoskeletal: Negative for back pain, joint pain and myalgias.  Skin: Negative for rash.  Neurological: Negative for dizziness, tingling, focal weakness, seizures, weakness and headaches.  Endo/Heme/Allergies: Does not bruise/bleed easily.  Psychiatric/Behavioral: Negative for depression and suicidal ideas. The patient does not have insomnia.      Current treatment- Herceptin every 3 weeks and tamoxifen for hormonal therapy  Allergies  Allergen Reactions  . Ceftin [Cefuroxime Axetil] Rash     Past Medical History:  Diagnosis Date  . Anemia    H/O  . Anxiety   . Breast cancer ([place])   . Chronic kidney disease 12-2015   PYLONEPHRITIS  . Complication of anesthesia    HARD TO WAKE UP AFTER TONSILLECTOMY AS A CHILD BUT NO PROBLEMS SINCE  . Complication of anesthesia    PT STATES THAT BACK IN OR DURING PORT PLACMENT IN 2016 THAT SHE BECAME VEERY FLUSHED, LIGHT HEADED AND CRNA SAID ANTIBIOTIC MAY BE GOING IN IN TO FAST-RATE DECREASED AND PTS SYMPTOMS RESOLVED IMMEDIATELY  . Depression   . GERD (gastroesophageal reflux disease)   . Shortness of breath dyspnea      Past Surgical History:  Procedure Laterality Date  . ABDOMINAL HYSTERECTOMY  2001  . BACK SURGERY     FOR SCOLIOSIS-HERRINGTON RODS IN PLACE  . BREAST BIOPSY Bilateral    08/17/2015   . PORTACATH PLACEMENT N/A 08/30/2015   Procedure: INSERTION PORT-A-CATH;  Surgeon: Leonie Green, MD;  Location: [facility] ORS;  Service: General;  Laterality: N/A;  . SENTINEL NODE BIOPSY Bilateral 03/08/2016   Procedure: SENTINEL NODE BIOPSY;  Surgeon: Leonie Green, MD;  Location: [facility] ORS;  Service: General;  Laterality: Bilateral;  . SIMPLE MASTECTOMY  WITH AXILLARY SENTINEL NODE BIOPSY Bilateral 03/08/2016   Procedure: SIMPLE MASTECTOMY;  Surgeon: Leonie Green, MD;  Location: [facility] ORS;  Service: General;  Laterality: Bilateral;  . TONSILLECTOMY      Social History   Social History  . Marital status: Married    Spouse name: N/A  . Number of children: N/A  . Years of education: N/A   Occupational History  . Not on file.   Social History Main Topics  . Smoking status: Former Smoker    Packs/day: 1.00    Years: 10.00    Types: Cigarettes    Quit date: 10/16/1999  . Smokeless tobacco: Never Used  . Alcohol use Yes     Comment: [employer]  . Drug use: No  . Sexual activity: Yes   Other Topics Concern  . Not on file   Social History Narrative  . No narrative on file    Family History  Problem Relation Age of Onset  . Breast cancer Maternal Aunt 64  . Cancer Father   . Cancer Sister   . Cancer Maternal Grandfather      Current Outpatient Prescriptions:  .  acetaminophen (TYLENOL) 500 MG tablet, Take 1,000 mg by mouth every 6 (six) hours as needed., Disp: , Rfl:  .  ALPRAZolam (XANAX) 0.25 MG tablet, Take 1/4 tablet up to three times daily QHS, Disp: , Rfl:  .  Dexlansoprazole (  DEXILANT) 30 MG capsule, Take 30 mg by mouth as needed. , Disp: , Rfl:  .  diphenhydrAMINE (SOMINEX) 25 MG tablet, Take 25 mg by mouth at bedtime. Reported on 01/12/2016, Disp: , Rfl:  .  HYDROcodone-acetaminophen (NORCO/VICODIN) 5-325 MG tablet, Take 1-2 tablets by mouth every 4 (four) hours as needed for moderate pain., Disp: 16 tablet, Rfl: 0 .  lidocaine-prilocaine (EMLA) cream, Apply 1 application topically as needed. Apply at least 1-2 hours before the port is to be used., Disp: 30 g, Rfl: 1 .  Multiple Vitamin (MULTIVITAMIN) capsule, Take 1 capsule by mouth daily., Disp: , Rfl:  .  tamoxifen (NOLVADEX) 20 MG tablet, Take 1 tablet (20 mg total) by mouth daily., Disp: 30 tablet, Rfl: 5 .  venlafaxine XR (EFFEXOR-XR) 37.5 MG 24 hr capsule,  Take 1 capsule (37.5 mg total) by mouth daily with breakfast., Disp: 30 capsule, Rfl: 3  Physical exam: There were no vitals filed for this visit. Physical Exam  Constitutional: She is oriented to person, place, and time and well-developed, well-nourished, and in no distress.  HENT:  Head: Normocephalic and atraumatic.  Eyes: EOM are normal. Pupils are equal, round, and reactive to light.  Neck: Normal range of motion.  Cardiovascular: Normal rate, regular rhythm and normal heart sounds.   Pulmonary/Chest: Effort normal and breath sounds normal.  Abdominal: Soft. Bowel sounds are normal.  Neurological: She is alert and oriented to person, place, and time.  Skin: Skin is warm and dry.     CMP Latest Ref Rng & Units 08/16/2016  Glucose 65 - 99 mg/dL 113(H)  BUN 6 - 20 mg/dL 16  Creatinine 0.44 - 1.00 mg/dL 0.77  Sodium 135 - 145 mmol/L 136  Potassium 3.5 - 5.1 mmol/L 4.0  Chloride 101 - 111 mmol/L 106  CO2 22 - 32 mmol/L 26  Calcium 8.9 - 10.3 mg/dL 8.5(L)  Total Protein 6.5 - 8.1 g/dL 6.4(L)  Total Bilirubin 0.3 - 1.2 mg/dL 0.5  Alkaline Phos 38 - 126 U/L 52  AST 15 - 41 U/L 23  ALT 14 - 54 U/L 19   CBC Latest Ref Rng & Units 10/04/2016  WBC 3.6 - 11.0 K/uL 5.7  Hemoglobin 12.0 - 16.0 g/dL 11.8(L)  Hematocrit 35.0 - 47.0 % 35.0  Platelets 150 - 440 K/uL 285      Assessment and plan- Patient is a 50 y.o. female with a history of stage IIB ER/PR positive, HER-2/neu negative right breast cancer status post bilateral mastectomy. She received neoadjuvant chemotherapy with ACTH P and is currently getting every 3 weekly Herceptin adjuvantly. She was also found to be premenopausal based on her hormone levels and is currently on tamoxifen for hormone therapy  1. Continue next cycle of Herceptin today and she will be seen back in 3 weeks' time prior to her next cycle of Herceptin. Her last echo was on 08/31/2016 and showed an EF of 50-55%. Continue tamoxifen for hormone therapy  2.  Hot flashes stable on Effexor. Continue that.  3. Patient already has perioperative prescriptions including antibiotics prior to her reconstruction surgery next week  4. Return to clinic in 3 weeks prior to the next cycle of Herceptin with CBC and CMP   Visit Diagnosis 1. Malignant neoplasm of central portion of right breast in female, estrogen receptor positive ([place])      Dr. Randa Evens, MD, MPH [place] [facility] Pager-  10/04/2016 8:22 AM

## 2016-10-10 DIAGNOSIS — Z853 Personal history of malignant neoplasm of breast: Secondary | ICD-10-CM | POA: Diagnosis not present

## 2016-10-10 DIAGNOSIS — C50911 Malignant neoplasm of unspecified site of right female breast: Secondary | ICD-10-CM | POA: Diagnosis not present

## 2016-10-10 DIAGNOSIS — Z87891 Personal history of nicotine dependence: Secondary | ICD-10-CM | POA: Diagnosis not present

## 2016-10-10 DIAGNOSIS — Z9013 Acquired absence of bilateral breasts and nipples: Secondary | ICD-10-CM | POA: Diagnosis not present

## 2016-10-10 DIAGNOSIS — C50919 Malignant neoplasm of unspecified site of unspecified female breast: Secondary | ICD-10-CM | POA: Diagnosis not present

## 2016-10-23 NOTE — Progress Notes (Signed)
Review of systems- Review of Systems  Constitutional: Negative for chills, fever, malaise/fatigue and weight loss.  HENT: Negative for congestion, ear discharge and nosebleeds.   Eyes: Negative for blurred vision.  Respiratory: Negative for cough, hemoptysis, sputum production, shortness of breath and wheezing.   Cardiovascular: Negative for chest pain, palpitations, orthopnea and claudication.  Gastrointestinal: Negative for abdominal pain, blood in stool, constipation, diarrhea, heartburn, melena, nausea and vomiting.  Genitourinary: Negative for dysuria, flank pain, frequency, hematuria and urgency.  Musculoskeletal: Negative for back pain, joint pain and myalgias.  Skin: Negative for rash.  Neurological: Negative for dizziness, tingling, focal weakness, seizures, weakness and headaches.  Endo/Heme/Allergies: Does not bruise/bleed easily.  Psychiatric/Behavioral: Negative for depression and suicidal ideas. The patient does not have insomnia.     Physical Exam  Constitutional: She is oriented to person, place, and time and well-developed, well-nourished, and in no distress.  HENT:  Head: Normocephalic and atraumatic.  Eyes: EOM are normal. Pupils are equal, round, and reactive to light.  Neck: Normal range of motion.  Cardiovascular: Normal rate, regular rhythm and normal heart sounds.   Pulmonary/Chest: Effort normal and breath sounds normal.  Abdominal: Soft. Bowel sounds are normal.  Neurological: She is alert and oriented to person, place, and time.  Skin: Skin is warm and dry.

## 2016-10-25 ENCOUNTER — Inpatient Hospital Stay: Payer: 59

## 2016-10-25 ENCOUNTER — Inpatient Hospital Stay (HOSPITAL_BASED_OUTPATIENT_CLINIC_OR_DEPARTMENT_OTHER): Payer: 59 | Admitting: Hematology and Oncology

## 2016-10-25 ENCOUNTER — Inpatient Hospital Stay: Payer: 59 | Attending: Oncology

## 2016-10-25 VITALS — BP 123/77 | HR 94 | Temp 97.8°F | Resp 18 | Wt 111.3 lb

## 2016-10-25 DIAGNOSIS — Z5112 Encounter for antineoplastic immunotherapy: Secondary | ICD-10-CM | POA: Insufficient documentation

## 2016-10-25 DIAGNOSIS — D649 Anemia, unspecified: Secondary | ICD-10-CM | POA: Insufficient documentation

## 2016-10-25 DIAGNOSIS — Z9013 Acquired absence of bilateral breasts and nipples: Secondary | ICD-10-CM | POA: Diagnosis not present

## 2016-10-25 DIAGNOSIS — Z8041 Family history of malignant neoplasm of ovary: Secondary | ICD-10-CM | POA: Insufficient documentation

## 2016-10-25 DIAGNOSIS — Z79810 Long term (current) use of selective estrogen receptor modulators (SERMs): Secondary | ICD-10-CM | POA: Insufficient documentation

## 2016-10-25 DIAGNOSIS — F419 Anxiety disorder, unspecified: Secondary | ICD-10-CM | POA: Diagnosis not present

## 2016-10-25 DIAGNOSIS — K219 Gastro-esophageal reflux disease without esophagitis: Secondary | ICD-10-CM

## 2016-10-25 DIAGNOSIS — Z9221 Personal history of antineoplastic chemotherapy: Secondary | ICD-10-CM | POA: Insufficient documentation

## 2016-10-25 DIAGNOSIS — R0602 Shortness of breath: Secondary | ICD-10-CM

## 2016-10-25 DIAGNOSIS — Z87440 Personal history of urinary (tract) infections: Secondary | ICD-10-CM | POA: Insufficient documentation

## 2016-10-25 DIAGNOSIS — F329 Major depressive disorder, single episode, unspecified: Secondary | ICD-10-CM | POA: Insufficient documentation

## 2016-10-25 DIAGNOSIS — G629 Polyneuropathy, unspecified: Secondary | ICD-10-CM | POA: Diagnosis not present

## 2016-10-25 DIAGNOSIS — Z803 Family history of malignant neoplasm of breast: Secondary | ICD-10-CM | POA: Diagnosis not present

## 2016-10-25 DIAGNOSIS — R232 Flushing: Secondary | ICD-10-CM

## 2016-10-25 DIAGNOSIS — Z170 Estrogen receptor positive status [ER+]: Principal | ICD-10-CM

## 2016-10-25 DIAGNOSIS — F17210 Nicotine dependence, cigarettes, uncomplicated: Secondary | ICD-10-CM | POA: Diagnosis not present

## 2016-10-25 DIAGNOSIS — Z79899 Other long term (current) drug therapy: Secondary | ICD-10-CM

## 2016-10-25 DIAGNOSIS — C50111 Malignant neoplasm of central portion of right female breast: Secondary | ICD-10-CM | POA: Insufficient documentation

## 2016-10-25 LAB — CBC WITH DIFFERENTIAL/PLATELET
BASOS PCT: 1 %
Basophils Absolute: 0 10*3/uL (ref 0–0.1)
EOS ABS: 0.1 10*3/uL (ref 0–0.7)
EOS PCT: 1 %
HCT: 31.5 % — ABNORMAL LOW (ref 35.0–47.0)
Hemoglobin: 10.6 g/dL — ABNORMAL LOW (ref 12.0–16.0)
Lymphocytes Relative: 12 %
Lymphs Abs: 1.1 10*3/uL (ref 1.0–3.6)
MCH: 30.3 pg (ref 26.0–34.0)
MCHC: 33.6 g/dL (ref 32.0–36.0)
MCV: 90.4 fL (ref 80.0–100.0)
MONO ABS: 0.5 10*3/uL (ref 0.2–0.9)
Monocytes Relative: 5 %
Neutro Abs: 7.4 10*3/uL — ABNORMAL HIGH (ref 1.4–6.5)
Neutrophils Relative %: 81 %
Platelets: 370 10*3/uL (ref 150–440)
RBC: 3.48 MIL/uL — ABNORMAL LOW (ref 3.80–5.20)
RDW: 12.9 % (ref 11.5–14.5)
WBC: 9.1 10*3/uL (ref 3.6–11.0)

## 2016-10-25 LAB — COMPREHENSIVE METABOLIC PANEL
ALBUMIN: 3.7 g/dL (ref 3.5–5.0)
ALK PHOS: 55 U/L (ref 38–126)
ALT: 15 U/L (ref 14–54)
AST: 19 U/L (ref 15–41)
Anion gap: 6 (ref 5–15)
BUN: 15 mg/dL (ref 6–20)
CALCIUM: 8.9 mg/dL (ref 8.9–10.3)
CHLORIDE: 104 mmol/L (ref 101–111)
CO2: 26 mmol/L (ref 22–32)
CREATININE: 0.7 mg/dL (ref 0.44–1.00)
GFR calc non Af Amer: >60 mL/min (ref >60)
GLUCOSE: 89 mg/dL (ref 65–99)
Potassium: 3.8 mmol/L (ref 3.5–5.1)
SODIUM: 136 mmol/L (ref 135–145)
Total Bilirubin: 0.6 mg/dL (ref 0.3–1.2)
Total Protein: 6.7 g/dL (ref 6.5–8.1)

## 2016-10-25 MED ORDER — VENLAFAXINE HCL ER 37.5 MG PO CP24: 37.5000 mg | ORAL_CAPSULE | Freq: Every day | ORAL | 3 refills | Status: DC

## 2016-10-25 MED ORDER — HEPARIN SOD (PORK) LOCK FLUSH 100 UNIT/ML IV SOLN
500.0000 [IU] | Freq: Once | INTRAVENOUS | Status: AC | PRN
  Administered 2016-10-25: 500 [IU]
  Filled 2016-10-25: qty 5

## 2016-10-25 MED ORDER — TRASTUZUMAB CHEMO INJECTION 440 MG
294.0000 mg | Freq: Once | INTRAVENOUS | Status: AC
  Administered 2016-10-25: 294 mg via INTRAVENOUS
  Filled 2016-10-25: qty 14

## 2016-10-25 MED ORDER — SODIUM CHLORIDE 0.9 % IV SOLN
Freq: Once | INTRAVENOUS | Status: AC
  Administered 2016-10-25: 15:00:00 via INTRAVENOUS
  Filled 2016-10-25: qty 1000

## 2016-10-25 MED ORDER — SODIUM CHLORIDE 0.9% FLUSH
10.0000 mL | INTRAVENOUS | Status: DC | PRN
  Administered 2016-10-25: 10 mL
  Filled 2016-10-25: qty 10

## 2016-10-25 MED ORDER — DIPHENHYDRAMINE HCL 25 MG PO CAPS
50.0000 mg | ORAL_CAPSULE | Freq: Once | ORAL | Status: AC
  Administered 2016-10-25: 25 mg via ORAL
  Filled 2016-10-25: qty 2

## 2016-10-25 MED ORDER — ACETAMINOPHEN 325 MG PO TABS
650.0000 mg | ORAL_TABLET | Freq: Once | ORAL | Status: AC
  Administered 2016-10-25: 650 mg via ORAL
  Filled 2016-10-25: qty 2

## 2016-10-25 NOTE — Patient Instructions (Signed)
Neratinib tablets What is this medicine? NERATINIB (ner A ti nib) is a medicine that targets proteins in cancer cells and stops the cancer cells from growing. It is used to treat breast cancer. This medicine may be used for other purposes; ask your health care provider or pharmacist if you have questions. COMMON BRAND NAME(S): Nerlynx What should I tell my health care provider before I take this medicine? They need to know if you have any of these conditions: -dehydration -liver disease -an unusual or allergic reaction to neratinib, other medicines, foods, dyes, or preservatives -pregnant or trying to get pregnant -breast-feeding How should I use this medicine? Take this medicine by mouth with a glass of water. Follow the directions on the prescription label. Do not cut, crush or chew this medicine. Do not take with grapefruit juice. Avoid taking this medicine within 3 hours of taking an antacid. Take this medicine with food. Take your medicine at regular intervals. Do not take it more often than directed. Do not stop taking except on your doctor's advice. Talk to your pediatrician regarding the use of this medicine in children. Special care may be needed. Overdosage: If you think you have taken too much of this medicine contact a poison control center or emergency room at once. NOTE: This medicine is only for you. Do not share this medicine with others. What if I miss a dose? If you miss a dose, then do not replace the missed dose. Take the next dose at your regular time. Do not take double or extra doses. What may interact with this medicine? This medicine may interact with the following medications: -antiviral medicines for HIV or AIDS -aprepitant -bosentan -calcium channel blockers like diltiazem and verapamil -certain antibiotics like clarithromycin, erythromycin and troleandomycin -certain medicines for fungal infections like clotrimazole, fluconazole, ketoconazole, itraconazole,  posaconazole, and voriconazole -certain medicines for seizures like carbamazepine, phenobarbital, phenytoin -ciprofloxacin -conivaptan -crizotinib -cyclosporine -dabigatran -digoxin -dronedarone -enzalutamide -fexofenadine -fluvoxamine -grapefruit juice -idelalisib -imatinib -mitotane -modafinil -nefazodone -rifampin -St. John's Wort -stomach acid blockers like cimetidine, famotidine, lansoprazole, ranitidine, or omeprazole -tofisopam This list may not describe all possible interactions. Give your health care provider a list of all the medicines, herbs, non-prescription drugs, or dietary supplements you use. Also tell them if you smoke, drink alcohol, or use illegal drugs. Some items may interact with your medicine. What should I watch for while using this medicine? Tell your doctor or healthcare professional if your symptoms do not start to get better or if they get worse. Do not become pregnant while taking this medicine or for 1 month after stopping it. Women should inform their doctor if they wish to become pregnant or think they might be pregnant. Men should not father a child while taking this medicine and for 3 months after stopping it. There is a potential for serious side effects to an unborn child. Talk to your health care professional or pharmacist for more information. Do not breast-feed an infant while taking this medicine or for 1 month after stopping it. Check with your doctor or health care professional if you get an attack of severe diarrhea, nausea and vomiting, or if you sweat a lot. The loss of too much body fluid can make it dangerous for you to take this medicine. You may need blood work done while you are taking this medicine. What side effects may I notice from receiving this medicine? Side effects that you should report to your doctor or health care professional as soon as possible: -  diarrhea -nausea, vomiting -signs and symptoms of liver injury like dark  yellow or brown urine; general ill feeling or flu-like symptoms; light-colored stools; loss of appetite; nausea; right upper belly pain; unusually weak or tired; yellowing of the eyes or skin -signs and symptoms of low blood pressure like dizziness; feeling faint or lightheaded, falls; unusually weak or tired -signs of infection - fever or chills, cough, sore throat, pain or difficulty passing urine Side effects that usually do not require medical attention (report these to your doctor or health care professional if they continue or are bothersome): -dry mouth -loss of appetite -mouth sores -muscle cramps -red spots on the skin -sore throat -stomach pain -upset stomach -weight loss This list may not describe all possible side effects. Call your doctor for medical advice about side effects. You may report side effects to FDA at 1-800-FDA-1088. Where should I keep my medicine? Keep out of the reach of children. Store between 20 and 25 degrees C (68 and 77 degrees F). Throw away any unused medicine after the expiration date. NOTE: This sheet is a summary. It may not cover all possible information. If you have questions about this medicine, talk to your doctor, pharmacist, or health care provider.  2017 Elsevier/Gold Standard (2016-05-01 17:44:20)

## 2016-10-25 NOTE — Progress Notes (Signed)
Patient is not taking calcium due to it causing severe constipation.  States she has increased dairy intake.

## 2016-10-25 NOTE — Progress Notes (Signed)
[facility] day:  10/25/16   Chief Complaint: Emma Atkinson is a 51 y.o. female with clinical stage IIB Her2/neu+ right breast cancer who is seen for assessment on tamoxifen and continuation of every 3 week Herceptin.  HPI:  The patient was last seen in the medical oncology clinic by me on 08/16/2016.  At that time, she felt good.  Vasomotor symptoms hadimproved on Effexor.  Exam was unremarkable.  She received Herceptin.  She continued tamoxifen.  She saw Dr. Janese Banks on 10/04/2016 in my absence.  At that time, she was doing well.  She described being tired on the days of Herceptin.  Hot flashes were stable.  Reconstruction was scheduled for the following week.  During the interim, she has felt wonderful.  She underwent reconstruction on 10/10/2016.  She began working full time yesterday.   Past Medical History:  Diagnosis Date  . Anemia    H/O  . Anxiety   . Breast cancer ([place])   . Chronic kidney disease 12-2015   PYLONEPHRITIS  . Complication of anesthesia    HARD TO WAKE UP AFTER TONSILLECTOMY AS A CHILD BUT NO PROBLEMS SINCE  . Complication of anesthesia    PT STATES THAT BACK IN OR DURING PORT PLACMENT IN 2016 THAT SHE BECAME VEERY FLUSHED, LIGHT HEADED AND CRNA SAID ANTIBIOTIC MAY BE GOING IN IN TO FAST-RATE DECREASED AND PTS SYMPTOMS RESOLVED IMMEDIATELY  . Depression   . GERD (gastroesophageal reflux disease)   . Shortness of breath dyspnea     Past Surgical History:  Procedure Laterality Date  . ABDOMINAL HYSTERECTOMY  2001  . BACK SURGERY     FOR SCOLIOSIS-HERRINGTON RODS IN PLACE  . BREAST BIOPSY Bilateral    08/17/2015   . PORTACATH PLACEMENT N/A 08/30/2015   Procedure: INSERTION PORT-A-CATH;  Surgeon: Leonie Green, MD;  Location: [facility] ORS;  Service: General;  Laterality: N/A;  . SENTINEL NODE BIOPSY Bilateral 03/08/2016   Procedure: SENTINEL NODE BIOPSY;  Surgeon: Leonie Green, MD;  Location: [facility] ORS;  Service:  General;  Laterality: Bilateral;  . SIMPLE MASTECTOMY WITH AXILLARY SENTINEL NODE BIOPSY Bilateral 03/08/2016   Procedure: SIMPLE MASTECTOMY;  Surgeon: Leonie Green, MD;  Location: [facility] ORS;  Service: General;  Laterality: Bilateral;  . TONSILLECTOMY      Family History  Problem Relation Age of Onset  . Breast cancer Maternal Aunt 64  . Cancer Father   . Cancer Sister   . Cancer Maternal Grandfather     Social History:  reports that she quit smoking about 17 years ago. Her smoking use included Cigarettes. She has a 10.00 pack-year smoking history. She has never used smokeless tobacco. She reports that she drinks alcohol. She reports that she does not use drugs.  She returned to full-time work on 10/24/2016.  The patient is alone today.  Allergies:  Allergies  Allergen Reactions  . Ceftin [Cefuroxime Axetil] Rash    Current Medications: Current Outpatient Prescriptions  Medication Sig Dispense Refill  . ALPRAZolam (XANAX) 0.25 MG tablet Take 1/4 tablet up to three times daily QHS    . Dexlansoprazole (DEXILANT) 30 MG capsule Take 30 mg by mouth as needed.     . diphenhydrAMINE (SOMINEX) 25 MG tablet Take 25 mg by mouth at bedtime. Reported on 01/12/2016    . lidocaine-prilocaine (EMLA) cream Apply 1 application topically as needed. Apply at least 1-2 hours before the port is to be used. 30 g 1  .  Multiple Vitamin (MULTIVITAMIN) capsule Take 1 capsule by mouth daily.    . tamoxifen (NOLVADEX) 20 MG tablet Take 1 tablet (20 mg total) by mouth daily. 30 tablet 5  . venlafaxine XR (EFFEXOR-XR) 37.5 MG 24 hr capsule Take 1 capsule (37.5 mg total) by mouth daily with breakfast. 30 capsule 3  . acetaminophen (TYLENOL) 500 MG tablet Take 1,000 mg by mouth every 6 (six) hours as needed.    Marland Kitchen HYDROcodone-acetaminophen (NORCO/VICODIN) 5-325 MG tablet Take 1-2 tablets by mouth every 4 (four) hours as needed for moderate pain. (Patient not taking: Reported on 10/25/2016) 16 tablet 0   No  current facility-administered medications for this visit.     Review of Systems:  GENERAL:  Feels wonderful.  No fevers or sweats.  Weight up 1 pound. PERFORMANCE STATUS (ECOG):  0 HEENT:  No visual changes, runny nose, sore throat, mouth sores or tenderness. Lungs: No shortness of breath or cough.  No pleuritic chest pain.  No hemoptysis. Cardiac:  No chest pain, palpitations, orthopnea, or PND. GI:  Calcium causes constipation (uses Miralax).  No nausea, vomiting, diarrhea, melena or hematochezia. GU:  No urgency, frequency, dysuria or hematuria.  Musculoskeletal:  No back pain.  No joint pain.  No muscle tenderness. Extremities:  No pain or swelling. Skin:  No rashes or skin changes. Neuro:  No numbness, weakness, balance or coordination issues. Endocrine:  Mild hot flashes.  No diabetes, thyroid issues, or night sweats. Psych:  Poor sleep.  No mood changes. Pain:  No focal pain. Review of systems:  All other systems reviewed and found to be negative.  Physical Exam: Blood pressure 123/77, pulse 94, temperature 97.8 F (36.6 C), temperature source Tympanic, resp. rate 18, weight 111 lb 5.3 oz (50.5 kg). O2 sats 94% with ambulation. GENERAL:  Thin tan woman sitting comfortably in the exam room in no acute distress.  MENTAL STATUS:  Alert and oriented to person, place and time. HEAD:  Short styled hair.  Normocephalic, atraumatic, face symmetric (thin), no Cushingoid features. EYES:  Blue eyes.  Pupils equal round and reactive to light and accomodation.  No conjunctivitis or scleral icterus. ENT:  Oropharynx clear without lesion.  Tongue normal. Mucous membranes moist.  RESPIRATORY:  Clear to auscultation without rales, wheezes or rhonchi. CARDIOVASCULAR:  Regular rate and rhythm without murmur, rub or gallop. No JVD. BREASTS:  s/p bilateral reconstructive surgery with implants.  Steri-strips in place.. ABDOMEN:  Soft, non-tender, with active bowel sounds, and no hepatosplenomegaly.   No masses. SKIN:  Tan.  No rashes, ulcers or lesions. EXTREMITIES: No edema, no skin discoloration or tenderness.  No palpable cords. LYMPH NODES: No palpable cervical, supraclavicular, axillary or inguinal adenopathy  NEUROLOGICAL: Unremarkable. PSYCH:  Appropriate.    Infusion on 10/25/2016  Component Date Value Ref Range Status  . WBC 10/25/2016 9.1  3.6 - 11.0 K/uL Final  . RBC 10/25/2016 3.48* 3.80 - 5.20 MIL/uL Final  . Hemoglobin 10/25/2016 10.6* 12.0 - 16.0 g/dL Final  . HCT 10/25/2016 31.5* 35.0 - 47.0 % Final  . MCV 10/25/2016 90.4  80.0 - 100.0 fL Final  . MCH 10/25/2016 30.3  26.0 - 34.0 pg Final  . MCHC 10/25/2016 33.6  32.0 - 36.0 g/dL Final  . RDW 10/25/2016 12.9  11.5 - 14.5 % Final  . Platelets 10/25/2016 370  150 - 440 K/uL Final  . Neutrophils Relative % 10/25/2016 81  % Final  . Neutro Abs 10/25/2016 7.4* 1.4 - 6.5 K/uL Final  .  Lymphocytes Relative 10/25/2016 12  % Final  . Lymphs Abs 10/25/2016 1.1  1.0 - 3.6 K/uL Final  . Monocytes Relative 10/25/2016 5  % Final  . Monocytes Absolute 10/25/2016 0.5  0.2 - 0.9 K/uL Final  . Eosinophils Relative 10/25/2016 1  % Final  . Eosinophils Absolute 10/25/2016 0.1  0 - 0.7 K/uL Final  . Basophils Relative 10/25/2016 1  % Final  . Basophils Absolute 10/25/2016 0.0  0 - 0.1 K/uL Final  . Sodium 10/25/2016 136  135 - 145 mmol/L Final  . Potassium 10/25/2016 3.8  3.5 - 5.1 mmol/L Final  . Chloride 10/25/2016 104  101 - 111 mmol/L Final  . CO2 10/25/2016 26  22 - 32 mmol/L Final  . Glucose, Bld 10/25/2016 89  65 - 99 mg/dL Final  . BUN 10/25/2016 15  6 - 20 mg/dL Final  . Creatinine, Ser 10/25/2016 0.70  0.44 - 1.00 mg/dL Final  . Calcium 10/25/2016 8.9  8.9 - 10.3 mg/dL Final  . Total Protein 10/25/2016 6.7  6.5 - 8.1 g/dL Final  . Albumin 10/25/2016 3.7  3.5 - 5.0 g/dL Final  . AST 10/25/2016 19  15 - 41 U/L Final  . ALT 10/25/2016 15  14 - 54 U/L Final  . Alkaline Phosphatase 10/25/2016 55  38 - 126 U/L Final  .  Total Bilirubin 10/25/2016 0.6  0.3 - 1.2 mg/dL Final  . GFR calc non Af Amer 10/25/2016 >60  >60 mL/min Final  . GFR calc Af Amer 10/25/2016 >60  >60 mL/min Final   Comment: (NOTE) The eGFR has been calculated using the CKD EPI equation. This calculation has not been validated in all clinical situations. eGFR's persistently <60 mL/min signify possible Chronic Kidney Disease.   . Anion gap 10/25/2016 6  5 - 15 Final    Assessment:  Emma Atkinson is a 51 y.o. female with clinical stage T2N1 (stage IIB) Her2/neu + right breast cancer s/p neoadjuvant chemotherapy and bilateral mastectomy.  She is currently receiving Herceptin every 3 weeks.  She presented with a minimally painful right breast mass with nipple inversion.  Bilateral mammogram and ultrasound on 08/12/2015 revealed a 4.1 cm right superior breast mass containing pleomorphic microcalcifications. There were several satellite lesions (1.3 cm, 0.9 cm, 0.6 cm) plus a 1.3 cm mass versus complicated cyst and a 0.8 cm cyst.  There was one 5 mm right axillary node with irregular thickened cortex.   Core needle biopsy on 08/17/2015 of the right breast mass revealed a grade 3 invasive mammary carcinoma. Right axillary biopsy confirmed metastatic mammary carcinoma. Tumor was ER positive (1-10%), PR positive (11-50%) and HER-2/neu 3+  PET scan on 08/26/2015 revealed hypermetabolic right breast mass (SUV 11.3) with a hypermetabolic right axillary lymph node and a subtle focus of very faint hypermetabolic activity laterally in the right breast. There was faint hypermetabolic activity at the biopsy site of the left breast (? biopsy related).    CA27.29 was 19.6 on 08/22/2015, 24.7 on 01/10/2016, and 15.5 on 03/23/2016.  She received 4 cycles of AC (09/13/2015 - 11/08/2015) with Neulasta support.  She received 4 cycles of Herceptin and Perjeta (11/29/2015 - 01/31/2016) and 12 weeks of Taxol (11/29/2015 - 02/14/2016).  She tolerated her chemotherapy  well.  She developed a grade I neuropathy.  She receives Herceptin every 3 weeks (last 10/04/2016).  She began tamoxifen on 07/05/2016.  She is on Effexor for vasomotor symptoms.  Bilateral mastectomy on 03/08/2016 revealed no residual disease in  the right breast.  Five lymph nodes were negative for malignancy.  Pathologic stage was ypT0 ypN0.  Left breast revealed benign intraductal papillomas and cysts adjacent to the biopsy site.  There was no atypia or malignancy.  She is considering reconstruction in 09/2016 or 10/2016.  She received 50.4 Gy right chest wall radiation from 05/07/2016 - 06/25/2016.  She is planning reconstruction at the end of 09/2016 or beginning of 10/2016.  She is s/p partial hysterectomy (ovaries remain) in 2001.  She was premenopausal ([place] 5 and estradiol 271.9) on 08/22/2015.  Echo on 08/26/2015 revealed an ejection fraction of 55-60%.  Echo on 11/25/2015 revealed an EF of 60-65%.  Echo on 02/23/2016 and 05/25/2016 revealed an EF of 55-65%.  Echo on 08/31/2016 revealed an EF of 50-55%.  She underwent reconstructive surgery on 10/10/2016.  She has a family history of breast and ovarian cancer.  My Risk genetic testing revealed no mutation on 08/22/2015.  Symptomatically, she feels good.  Vasomotor symptoms are well controlled on Effexor.  Exam is unremarkable.  Plan: 1.  Labs today:  CBC with diff, CMP. 2.  Herceptin today.   3.  Schedule follow-up echo on 11/30/2015. 4.  Discuss consideration of neratinib post completion of Herceptin.  Side effects reviewed.  Information provided. 5.  Continue tamoxifen. 6.  Refill Effexor. 7.  RTC on 11/15/2016 for Herceptin 8.  RTC in 6 weeks for MD assessment, labs (CBC with diff, CMP, CA27.29), review of final echo, and initiation of neratinib.   Lequita Asal, MD  10/25/2016, 2:20 PM

## 2016-10-30 ENCOUNTER — Telehealth: Payer: Self-pay | Admitting: *Deleted

## 2016-10-30 NOTE — Telephone Encounter (Signed)
Patient does want to try neratinib, will work on [place].

## 2016-10-30 NOTE — Telephone Encounter (Signed)
  Please call patient back.  She was deciding if she wanted to take neratinib after completion of Herceptin.  M

## 2016-10-30 NOTE — Telephone Encounter (Signed)
States she was told to call regarding medication, please return her call

## 2016-10-30 NOTE — Telephone Encounter (Signed)
Patient has decided that she would like to try neratinib, Will work on prior authorization.

## 2016-10-31 ENCOUNTER — Other Ambulatory Visit: Payer: Self-pay | Admitting: Hematology and Oncology

## 2016-10-31 DIAGNOSIS — C50111 Malignant neoplasm of central portion of right female breast: Secondary | ICD-10-CM

## 2016-10-31 DIAGNOSIS — Z170 Estrogen receptor positive status [ER+]: Principal | ICD-10-CM

## 2016-10-31 MED ORDER — NERATINIB MALEATE 40 MG PO TABS: 240.0000 mg | ORAL_TABLET | Freq: Every day | ORAL | 0 refills | Status: DC

## 2016-11-09 ENCOUNTER — Other Ambulatory Visit: Payer: Self-pay

## 2016-11-09 MED ORDER — FLUCONAZOLE 150 MG PO TABS: ORAL_TABLET | ORAL | 0 refills | Status: DC

## 2016-11-15 ENCOUNTER — Inpatient Hospital Stay: Payer: 59 | Attending: Hematology and Oncology

## 2016-11-15 ENCOUNTER — Other Ambulatory Visit: Payer: Self-pay | Admitting: Hematology and Oncology

## 2016-11-15 VITALS — BP 162/93 | HR 84 | Temp 97.1°F | Resp 18

## 2016-11-15 DIAGNOSIS — K219 Gastro-esophageal reflux disease without esophagitis: Secondary | ICD-10-CM | POA: Diagnosis not present

## 2016-11-15 DIAGNOSIS — Z79810 Long term (current) use of selective estrogen receptor modulators (SERMs): Secondary | ICD-10-CM | POA: Insufficient documentation

## 2016-11-15 DIAGNOSIS — Z79899 Other long term (current) drug therapy: Secondary | ICD-10-CM | POA: Diagnosis not present

## 2016-11-15 DIAGNOSIS — F329 Major depressive disorder, single episode, unspecified: Secondary | ICD-10-CM | POA: Diagnosis not present

## 2016-11-15 DIAGNOSIS — R0602 Shortness of breath: Secondary | ICD-10-CM | POA: Insufficient documentation

## 2016-11-15 DIAGNOSIS — E876 Hypokalemia: Secondary | ICD-10-CM | POA: Diagnosis not present

## 2016-11-15 DIAGNOSIS — Z809 Family history of malignant neoplasm, unspecified: Secondary | ICD-10-CM | POA: Insufficient documentation

## 2016-11-15 DIAGNOSIS — D649 Anemia, unspecified: Secondary | ICD-10-CM | POA: Diagnosis not present

## 2016-11-15 DIAGNOSIS — Z803 Family history of malignant neoplasm of breast: Secondary | ICD-10-CM | POA: Insufficient documentation

## 2016-11-15 DIAGNOSIS — Z170 Estrogen receptor positive status [ER+]: Secondary | ICD-10-CM | POA: Diagnosis not present

## 2016-11-15 DIAGNOSIS — Z87891 Personal history of nicotine dependence: Secondary | ICD-10-CM | POA: Insufficient documentation

## 2016-11-15 DIAGNOSIS — Z8041 Family history of malignant neoplasm of ovary: Secondary | ICD-10-CM | POA: Insufficient documentation

## 2016-11-15 DIAGNOSIS — Z923 Personal history of irradiation: Secondary | ICD-10-CM | POA: Insufficient documentation

## 2016-11-15 DIAGNOSIS — Z9013 Acquired absence of bilateral breasts and nipples: Secondary | ICD-10-CM | POA: Insufficient documentation

## 2016-11-15 DIAGNOSIS — Z9049 Acquired absence of other specified parts of digestive tract: Secondary | ICD-10-CM | POA: Diagnosis not present

## 2016-11-15 DIAGNOSIS — Z90710 Acquired absence of both cervix and uterus: Secondary | ICD-10-CM | POA: Insufficient documentation

## 2016-11-15 DIAGNOSIS — Z9221 Personal history of antineoplastic chemotherapy: Secondary | ICD-10-CM | POA: Insufficient documentation

## 2016-11-15 DIAGNOSIS — C50811 Malignant neoplasm of overlapping sites of right female breast: Secondary | ICD-10-CM | POA: Diagnosis not present

## 2016-11-15 DIAGNOSIS — F419 Anxiety disorder, unspecified: Secondary | ICD-10-CM | POA: Diagnosis not present

## 2016-11-15 DIAGNOSIS — Z5112 Encounter for antineoplastic immunotherapy: Secondary | ICD-10-CM | POA: Diagnosis not present

## 2016-11-15 DIAGNOSIS — K5900 Constipation, unspecified: Secondary | ICD-10-CM | POA: Insufficient documentation

## 2016-11-15 DIAGNOSIS — Z87440 Personal history of urinary (tract) infections: Secondary | ICD-10-CM | POA: Insufficient documentation

## 2016-11-15 DIAGNOSIS — C50111 Malignant neoplasm of central portion of right female breast: Secondary | ICD-10-CM

## 2016-11-15 MED ORDER — SODIUM CHLORIDE 0.9% FLUSH
10.0000 mL | INTRAVENOUS | Status: DC | PRN
  Administered 2016-11-15: 10 mL
  Filled 2016-11-15: qty 10

## 2016-11-15 MED ORDER — HEPARIN SOD (PORK) LOCK FLUSH 100 UNIT/ML IV SOLN
500.0000 [IU] | Freq: Once | INTRAVENOUS | Status: AC | PRN
  Administered 2016-11-15: 500 [IU]
  Filled 2016-11-15: qty 5

## 2016-11-15 MED ORDER — ACETAMINOPHEN 325 MG PO TABS
650.0000 mg | ORAL_TABLET | Freq: Once | ORAL | Status: AC
  Administered 2016-11-15: 650 mg via ORAL
  Filled 2016-11-15: qty 2

## 2016-11-15 MED ORDER — SODIUM CHLORIDE 0.9 % IV SOLN
Freq: Once | INTRAVENOUS | Status: AC
  Administered 2016-11-15: 14:00:00 via INTRAVENOUS
  Filled 2016-11-15: qty 1000

## 2016-11-15 MED ORDER — TRASTUZUMAB CHEMO INJECTION 440 MG
294.0000 mg | Freq: Once | INTRAVENOUS | Status: AC
  Administered 2016-11-15: 294 mg via INTRAVENOUS
  Filled 2016-11-15: qty 14

## 2016-11-15 MED ORDER — DIPHENHYDRAMINE HCL 25 MG PO CAPS
25.0000 mg | ORAL_CAPSULE | Freq: Once | ORAL | Status: AC
  Administered 2016-11-15: 25 mg via ORAL
  Filled 2016-11-15: qty 1

## 2016-11-21 ENCOUNTER — Encounter: Payer: Self-pay | Admitting: Hematology and Oncology

## 2016-11-21 ENCOUNTER — Telehealth: Payer: Self-pay | Admitting: *Deleted

## 2016-11-21 DIAGNOSIS — C50111 Malignant neoplasm of central portion of right female breast: Secondary | ICD-10-CM

## 2016-11-21 DIAGNOSIS — R232 Flushing: Secondary | ICD-10-CM

## 2016-11-21 DIAGNOSIS — Z170 Estrogen receptor positive status [ER+]: Principal | ICD-10-CM

## 2016-11-21 MED ORDER — VENLAFAXINE HCL ER 37.5 MG PO CP24: 37.5000 mg | ORAL_CAPSULE | Freq: Every day | ORAL | 3 refills | Status: DC

## 2016-11-21 NOTE — Telephone Encounter (Signed)
Verbal permission for refill from [place]. Electronically sent in refill to her pharmacy and pt notified that it was refilled at her pharmacy for the effexor

## 2016-11-29 ENCOUNTER — Ambulatory Visit
Admission: RE | Admit: 2016-11-29 | Discharge: 2016-11-29 | Disposition: A | Discharge status: Home / Self Care | Payer: 59 | Source: Ambulatory Visit | Attending: Hematology and Oncology | Admitting: Hematology and Oncology

## 2016-11-29 DIAGNOSIS — Z9221 Personal history of antineoplastic chemotherapy: Secondary | ICD-10-CM | POA: Diagnosis not present

## 2016-11-29 DIAGNOSIS — R0602 Shortness of breath: Secondary | ICD-10-CM | POA: Diagnosis not present

## 2016-11-29 DIAGNOSIS — F419 Anxiety disorder, unspecified: Secondary | ICD-10-CM | POA: Insufficient documentation

## 2016-11-29 DIAGNOSIS — R232 Flushing: Secondary | ICD-10-CM | POA: Diagnosis not present

## 2016-11-29 DIAGNOSIS — Z170 Estrogen receptor positive status [ER+]: Secondary | ICD-10-CM | POA: Diagnosis not present

## 2016-11-29 DIAGNOSIS — N189 Chronic kidney disease, unspecified: Secondary | ICD-10-CM | POA: Diagnosis not present

## 2016-11-29 DIAGNOSIS — K219 Gastro-esophageal reflux disease without esophagitis: Secondary | ICD-10-CM | POA: Insufficient documentation

## 2016-11-29 DIAGNOSIS — C50111 Malignant neoplasm of central portion of right female breast: Secondary | ICD-10-CM | POA: Diagnosis not present

## 2016-12-03 ENCOUNTER — Telehealth: Payer: Self-pay | Admitting: *Deleted

## 2016-12-03 NOTE — Telephone Encounter (Signed)
Called patient and informed her that she needed to wait until she sees the MD, voiced understanding.

## 2016-12-06 ENCOUNTER — Encounter: Payer: Self-pay | Admitting: Hematology and Oncology

## 2016-12-06 ENCOUNTER — Ambulatory Visit: Payer: Self-pay | Admitting: Physician Assistant

## 2016-12-06 ENCOUNTER — Encounter: Payer: Self-pay | Admitting: Physician Assistant

## 2016-12-06 ENCOUNTER — Inpatient Hospital Stay: Payer: 59

## 2016-12-06 ENCOUNTER — Telehealth: Payer: Self-pay | Admitting: *Deleted

## 2016-12-06 ENCOUNTER — Inpatient Hospital Stay (HOSPITAL_BASED_OUTPATIENT_CLINIC_OR_DEPARTMENT_OTHER): Payer: 59 | Admitting: Hematology and Oncology

## 2016-12-06 VITALS — BP 150/94 | HR 84 | Temp 98.3°F

## 2016-12-06 VITALS — BP 137/83 | HR 96 | Temp 98.4°F | Resp 18 | Wt 110.0 lb

## 2016-12-06 DIAGNOSIS — Z79810 Long term (current) use of selective estrogen receptor modulators (SERMs): Secondary | ICD-10-CM

## 2016-12-06 DIAGNOSIS — K5900 Constipation, unspecified: Secondary | ICD-10-CM

## 2016-12-06 DIAGNOSIS — B9789 Other viral agents as the cause of diseases classified elsewhere: Principal | ICD-10-CM

## 2016-12-06 DIAGNOSIS — Z170 Estrogen receptor positive status [ER+]: Secondary | ICD-10-CM | POA: Diagnosis not present

## 2016-12-06 DIAGNOSIS — E876 Hypokalemia: Secondary | ICD-10-CM | POA: Diagnosis not present

## 2016-12-06 DIAGNOSIS — J029 Acute pharyngitis, unspecified: Secondary | ICD-10-CM

## 2016-12-06 DIAGNOSIS — D649 Anemia, unspecified: Secondary | ICD-10-CM | POA: Diagnosis not present

## 2016-12-06 DIAGNOSIS — K219 Gastro-esophageal reflux disease without esophagitis: Secondary | ICD-10-CM | POA: Diagnosis not present

## 2016-12-06 DIAGNOSIS — Z87891 Personal history of nicotine dependence: Secondary | ICD-10-CM

## 2016-12-06 DIAGNOSIS — Z809 Family history of malignant neoplasm, unspecified: Secondary | ICD-10-CM

## 2016-12-06 DIAGNOSIS — Z9221 Personal history of antineoplastic chemotherapy: Secondary | ICD-10-CM

## 2016-12-06 DIAGNOSIS — R0602 Shortness of breath: Secondary | ICD-10-CM

## 2016-12-06 DIAGNOSIS — Z923 Personal history of irradiation: Secondary | ICD-10-CM

## 2016-12-06 DIAGNOSIS — J028 Acute pharyngitis due to other specified organisms: Principal | ICD-10-CM

## 2016-12-06 DIAGNOSIS — Z9049 Acquired absence of other specified parts of digestive tract: Secondary | ICD-10-CM

## 2016-12-06 DIAGNOSIS — R232 Flushing: Secondary | ICD-10-CM

## 2016-12-06 DIAGNOSIS — F329 Major depressive disorder, single episode, unspecified: Secondary | ICD-10-CM | POA: Diagnosis not present

## 2016-12-06 DIAGNOSIS — Z90710 Acquired absence of both cervix and uterus: Secondary | ICD-10-CM

## 2016-12-06 DIAGNOSIS — F419 Anxiety disorder, unspecified: Secondary | ICD-10-CM

## 2016-12-06 DIAGNOSIS — Z79899 Other long term (current) drug therapy: Secondary | ICD-10-CM

## 2016-12-06 DIAGNOSIS — Z8041 Family history of malignant neoplasm of ovary: Secondary | ICD-10-CM

## 2016-12-06 DIAGNOSIS — Z5112 Encounter for antineoplastic immunotherapy: Secondary | ICD-10-CM | POA: Diagnosis not present

## 2016-12-06 DIAGNOSIS — C50111 Malignant neoplasm of central portion of right female breast: Secondary | ICD-10-CM

## 2016-12-06 DIAGNOSIS — Z87440 Personal history of urinary (tract) infections: Secondary | ICD-10-CM

## 2016-12-06 DIAGNOSIS — Z9013 Acquired absence of bilateral breasts and nipples: Secondary | ICD-10-CM

## 2016-12-06 DIAGNOSIS — Z803 Family history of malignant neoplasm of breast: Secondary | ICD-10-CM

## 2016-12-06 DIAGNOSIS — C50811 Malignant neoplasm of overlapping sites of right female breast: Secondary | ICD-10-CM

## 2016-12-06 LAB — COMPREHENSIVE METABOLIC PANEL
ALT: 25 U/L (ref 14–54)
AST: 31 U/L (ref 15–41)
Albumin: 4.2 g/dL (ref 3.5–5.0)
Alkaline Phosphatase: 59 U/L (ref 38–126)
Anion gap: 9 (ref 5–15)
BUN: 14 mg/dL (ref 6–20)
CO2: 27 mmol/L (ref 22–32)
Calcium: 9.6 mg/dL (ref 8.9–10.3)
Chloride: 102 mmol/L (ref 101–111)
Creatinine, Ser: 0.68 mg/dL (ref 0.44–1.00)
GFR calc Af Amer: >60 mL/min (ref >60)
GFR calc non Af Amer: >60 mL/min (ref >60)
Glucose, Bld: 99 mg/dL (ref 65–99)
Potassium: 3.3 mmol/L — ABNORMAL LOW (ref 3.5–5.1)
Sodium: 138 mmol/L (ref 135–145)
Total Bilirubin: 0.8 mg/dL (ref 0.3–1.2)
Total Protein: 7.3 g/dL (ref 6.5–8.1)

## 2016-12-06 LAB — CBC WITH DIFFERENTIAL/PLATELET
Basophils Absolute: 0 10*3/uL (ref 0–0.1)
Basophils Relative: 0 %
Eosinophils Absolute: 0.1 10*3/uL (ref 0–0.7)
Eosinophils Relative: 1 %
HCT: 35.1 % (ref 35.0–47.0)
Hemoglobin: 11.6 g/dL — ABNORMAL LOW (ref 12.0–16.0)
Lymphocytes Relative: 14 %
Lymphs Abs: 1.4 10*3/uL (ref 1.0–3.6)
MCH: 29.8 pg (ref 26.0–34.0)
MCHC: 33 g/dL (ref 32.0–36.0)
MCV: 90.5 fL (ref 80.0–100.0)
Monocytes Absolute: 0.5 10*3/uL (ref 0.2–0.9)
Monocytes Relative: 6 %
Neutro Abs: 7.6 10*3/uL — ABNORMAL HIGH (ref 1.4–6.5)
Neutrophils Relative %: 79 %
Platelets: 356 10*3/uL (ref 150–440)
RBC: 3.88 MIL/uL (ref 3.80–5.20)
RDW: 14.1 % (ref 11.5–14.5)
WBC: 9.7 10*3/uL (ref 3.6–11.0)

## 2016-12-06 MED ORDER — TAMOXIFEN CITRATE 20 MG PO TABS: 20.0000 mg | ORAL_TABLET | Freq: Every day | ORAL | 5 refills | Status: DC

## 2016-12-06 MED ORDER — POTASSIUM CHLORIDE ER 10 MEQ PO TBCR: 10.0000 meq | EXTENDED_RELEASE_TABLET | Freq: Two times a day (BID) | ORAL | 1 refills | Status: DC

## 2016-12-06 MED ORDER — MAGIC MOUTHWASH: 5.0000 mL | Freq: Three times a day (TID) | ORAL | 5 refills | Status: DC | PRN

## 2016-12-06 NOTE — Progress Notes (Signed)
[facility] day:  12/06/16   Chief Complaint: Emma Atkinson is a 51 y.o. female with clinical stage IIB Her2/neu+ right breast cancer who is seen for assessment on tamoxifen and prior to initiation of neratinib.  HPI:  The patient was last seen in the medical oncology on 10/25/2016.  At that time, she felt wonderful.  Vasomotor symptoms were well controlled on Effexor.  Exam was unremarkable sp reconstruction.  She received Herceptin.  She completed a year of adjuvant Herceptin on 11/15/2016.  Echo on 11/29/2016 revealed an EF of 60-65%.  Symptomatically, she feels good.  She denies any complaint.  She has her final reconstructive surgery on 01/11/2017.  She takes Miralax regularly (q day up to TID prn) for chronic constipation.   Past Medical History:  Diagnosis Date  . Anemia    H/O  . Anxiety   . Breast cancer ([place])   . Chronic kidney disease 12-2015   PYLONEPHRITIS  . Complication of anesthesia    HARD TO WAKE UP AFTER TONSILLECTOMY AS A CHILD BUT NO PROBLEMS SINCE  . Complication of anesthesia    PT STATES THAT BACK IN OR DURING PORT PLACMENT IN 2016 THAT SHE BECAME VEERY FLUSHED, LIGHT HEADED AND CRNA SAID ANTIBIOTIC MAY BE GOING IN IN TO FAST-RATE DECREASED AND PTS SYMPTOMS RESOLVED IMMEDIATELY  . Depression   . GERD (gastroesophageal reflux disease)   . Shortness of breath dyspnea     Past Surgical History:  Procedure Laterality Date  . ABDOMINAL HYSTERECTOMY  2001  . BACK SURGERY     FOR SCOLIOSIS-HERRINGTON RODS IN PLACE  . BREAST BIOPSY Bilateral    08/17/2015   . PORTACATH PLACEMENT N/A 08/30/2015   Procedure: INSERTION PORT-A-CATH;  Surgeon: Leonie Green, MD;  Location: [facility] ORS;  Service: General;  Laterality: N/A;  . SENTINEL NODE BIOPSY Bilateral 03/08/2016   Procedure: SENTINEL NODE BIOPSY;  Surgeon: Leonie Green, MD;  Location: [facility] ORS;  Service: General;  Laterality: Bilateral;  . SIMPLE MASTECTOMY  WITH AXILLARY SENTINEL NODE BIOPSY Bilateral 03/08/2016   Procedure: SIMPLE MASTECTOMY;  Surgeon: Leonie Green, MD;  Location: [facility] ORS;  Service: General;  Laterality: Bilateral;  . TONSILLECTOMY      Family History  Problem Relation Age of Onset  . Breast cancer Maternal Aunt 64  . Cancer Father   . Cancer Sister   . Cancer Maternal Grandfather     Social History:  reports that she quit smoking about 17 years ago. Her smoking use included Cigarettes. She has a 10.00 pack-year smoking history. She has never used smokeless tobacco. She reports that she drinks alcohol. She reports that she does not use drugs.  She returned to full-time work on 10/24/2016.  The patient is alone today.  Allergies:  Allergies  Allergen Reactions  . Ceftin [Cefuroxime Axetil] Rash    Current Medications: Current Outpatient Prescriptions  Medication Sig Dispense Refill  . acetaminophen (TYLENOL) 500 MG tablet Take 1,000 mg by mouth every 6 (six) hours as needed.    . ALPRAZolam (XANAX) 0.25 MG tablet Take 1/4 tablet up to three times daily QHS    . Dexlansoprazole (DEXILANT) 30 MG capsule Take 30 mg by mouth as needed.     . diphenhydrAMINE (SOMINEX) 25 MG tablet Take 25 mg by mouth at bedtime. Reported on 01/12/2016    . fluconazole (DIFLUCAN) 150 MG tablet Take 1 tablet now and one 3 days later 2 tablet 0  .  HYDROcodone-acetaminophen (NORCO/VICODIN) 5-325 MG tablet Take 1-2 tablets by mouth every 4 (four) hours as needed for moderate pain. 16 tablet 0  . lidocaine-prilocaine (EMLA) cream Apply 1 application topically as needed. Apply at least 1-2 hours before the port is to be used. 30 g 1  . magic mouthwash SOLN Take 5 mLs by mouth 3 (three) times daily as needed for mouth pain. 150 mL 5  . Multiple Vitamin (MULTIVITAMIN) capsule Take 1 capsule by mouth daily.    Marland Kitchen venlafaxine XR (EFFEXOR-XR) 37.5 MG 24 hr capsule Take 1 capsule (37.5 mg total) by mouth daily with breakfast. 30 capsule 3  . [START  ON 12/10/2016] Neratinib Maleate (NERLYNX) 40 MG tablet Take 6 tablets (240 mg total) by mouth daily. Take with food. (Patient not taking: Reported on 12/06/2016) 180 tablet 0  . tamoxifen (NOLVADEX) 20 MG tablet Take 1 tablet (20 mg total) by mouth daily. (Patient not taking: Reported on 12/06/2016) 30 tablet 5   No current facility-administered medications for this visit.     Review of Systems:  GENERAL:  Feels wonderful.  No fevers or sweats.  Weight down 1 pound. PERFORMANCE STATUS (ECOG):  0 HEENT:  No visual changes, runny nose, sore throat, mouth sores or tenderness. Lungs: No shortness of breath or cough.  No pleuritic chest pain.  No hemoptysis. Cardiac:  No chest pain, palpitations, orthopnea, or PND. GI: Chronic constipation, uses Miralax daily.  No nausea, vomiting, diarrhea, melena or hematochezia. GU:  No urgency, frequency, dysuria or hematuria.  Musculoskeletal:  No back pain.  No joint pain.  No muscle tenderness. Extremities:  No pain or swelling. Skin:  No rashes or skin changes. Neuro:  No numbness, weakness, balance or coordination issues. Endocrine:  Mild hot flashes.  No diabetes, thyroid issues, or night sweats. Psych:  Poor sleep.  No mood changes. Pain:  No focal pain. Review of systems:  All other systems reviewed and found to be negative.  Physical Exam: Blood pressure 137/83, pulse 96, temperature 98.4 F (36.9 C), temperature source Tympanic, resp. rate 18, weight 110 lb 0.2 oz (49.9 kg). O2 sats 94% with ambulation. GENERAL:  Thin tan woman sitting comfortably in the exam room in no acute distress.  MENTAL STATUS:  Alert and oriented to person, place and time. HEAD:  Short styled hair.  Normocephalic, atraumatic, face symmetric (thin), no Cushingoid features. EYES:  Blue eyes.  Pupils equal round and reactive to light and accomodation.  No conjunctivitis or scleral icterus. ENT:  Oropharynx clear without lesion.  Tongue normal. Mucous membranes moist.   RESPIRATORY:  Clear to auscultation without rales, wheezes or rhonchi. CARDIOVASCULAR:  Regular rate and rhythm without murmur, rub or gallop. No JVD. ABDOMEN:  Soft, non-tender, with active bowel sounds, and no hepatosplenomegaly.  No masses. SKIN:  Tan.  No rashes, ulcers or lesions. EXTREMITIES: No edema, no skin discoloration or tenderness.  No palpable cords. LYMPH NODES: No palpable cervical, supraclavicular, axillary or inguinal adenopathy  NEUROLOGICAL: Unremarkable. PSYCH:  Appropriate.    Appointment on 12/06/2016  Component Date Value Ref Range Status  . WBC 12/06/2016 9.7  3.6 - 11.0 K/uL Final  . RBC 12/06/2016 3.88  3.80 - 5.20 MIL/uL Final  . Hemoglobin 12/06/2016 11.6* 12.0 - 16.0 g/dL Final  . HCT 12/06/2016 35.1  35.0 - 47.0 % Final  . MCV 12/06/2016 90.5  80.0 - 100.0 fL Final  . MCH 12/06/2016 29.8  26.0 - 34.0 pg Final  . MCHC 12/06/2016 33.0  32.0 - 36.0 g/dL Final  . RDW 12/06/2016 14.1  11.5 - 14.5 % Final  . Platelets 12/06/2016 356  150 - 440 K/uL Final  . Neutrophils Relative % 12/06/2016 79  % Final  . Neutro Abs 12/06/2016 7.6* 1.4 - 6.5 K/uL Final  . Lymphocytes Relative 12/06/2016 14  % Final  . Lymphs Abs 12/06/2016 1.4  1.0 - 3.6 K/uL Final  . Monocytes Relative 12/06/2016 6  % Final  . Monocytes Absolute 12/06/2016 0.5  0.2 - 0.9 K/uL Final  . Eosinophils Relative 12/06/2016 1  % Final  . Eosinophils Absolute 12/06/2016 0.1  0 - 0.7 K/uL Final  . Basophils Relative 12/06/2016 0  % Final  . Basophils Absolute 12/06/2016 0.0  0 - 0.1 K/uL Final  . Sodium 12/06/2016 138  135 - 145 mmol/L Final  . Potassium 12/06/2016 3.3* 3.5 - 5.1 mmol/L Final  . Chloride 12/06/2016 102  101 - 111 mmol/L Final  . CO2 12/06/2016 27  22 - 32 mmol/L Final  . Glucose, Bld 12/06/2016 99  65 - 99 mg/dL Final  . BUN 12/06/2016 14  6 - 20 mg/dL Final  . Creatinine, Ser 12/06/2016 0.68  0.44 - 1.00 mg/dL Final  . Calcium 12/06/2016 9.6  8.9 - 10.3 mg/dL Final  . Total  Protein 12/06/2016 7.3  6.5 - 8.1 g/dL Final  . Albumin 12/06/2016 4.2  3.5 - 5.0 g/dL Final  . AST 12/06/2016 31  15 - 41 U/L Final  . ALT 12/06/2016 25  14 - 54 U/L Final  . Alkaline Phosphatase 12/06/2016 59  38 - 126 U/L Final  . Total Bilirubin 12/06/2016 0.8  0.3 - 1.2 mg/dL Final  . GFR calc non Af Amer 12/06/2016 >60  >60 mL/min Final  . GFR calc Af Amer 12/06/2016 >60  >60 mL/min Final   Comment: (NOTE) The eGFR has been calculated using the CKD EPI equation. This calculation has not been validated in all clinical situations. eGFR's persistently <60 mL/min signify possible Chronic Kidney Disease.   . Anion gap 12/06/2016 9  5 - 15 Final    Assessment:  Emma Atkinson is a 51 y.o. female with clinical stage T2N1 (stage IIB) Her2/neu + right breast cancer s/p neoadjuvant chemotherapy and bilateral mastectomy.  She completed a year of adjuvant Herceptin.  She presented with a minimally painful right breast mass with nipple inversion.  Bilateral mammogram and ultrasound on 08/12/2015 revealed a 4.1 cm right superior breast mass containing pleomorphic microcalcifications. There were several satellite lesions (1.3 cm, 0.9 cm, 0.6 cm) plus a 1.3 cm mass versus complicated cyst and a 0.8 cm cyst.  There was one 5 mm right axillary node with irregular thickened cortex.   Core needle biopsy on 08/17/2015 of the right breast mass revealed a grade 3 invasive mammary carcinoma. Right axillary biopsy confirmed metastatic mammary carcinoma. Tumor was ER positive (1-10%), PR positive (11-50%) and HER-2/neu 3+  PET scan on 08/26/2015 revealed hypermetabolic right breast mass (SUV 11.3) with a hypermetabolic right axillary lymph node and a subtle focus of very faint hypermetabolic activity laterally in the right breast. There was faint hypermetabolic activity at the biopsy site of the left breast (? biopsy related).    CA27.29 was 19.6 on 08/22/2015, 24.7 on 01/10/2016, and 15.5 on 03/23/2016.  She  received 4 cycles of AC (09/13/2015 - 11/08/2015) with Neulasta support.  She received 4 cycles of Herceptin and Perjeta (11/29/2015 - 01/31/2016) and 12 weeks of Taxol (11/29/2015 -  02/14/2016).  She tolerated her chemotherapy well.  She developed a grade I neuropathy.  She completed a year of adjuvant Herceptin on 11/15/2016.  She began tamoxifen on 07/05/2016.  She is on Effexor for vasomotor symptoms.  Bilateral mastectomy on 03/08/2016 revealed no residual disease in the right breast.  Five lymph nodes were negative for malignancy.  Pathologic stage was ypT0 ypN0.  Left breast revealed benign intraductal papillomas and cysts adjacent to the biopsy site.  There was no atypia or malignancy.  She is considering reconstruction in 09/2016 or 10/2016.  She received 50.4 Gy right chest wall radiation from 05/07/2016 - 06/25/2016.  She is planning reconstruction at the end of 09/2016 or beginning of 10/2016.  She is s/p partial hysterectomy (ovaries remain) in 2001.  She was premenopausal ([place] 5 and estradiol 271.9) on 08/22/2015.  Echo on 08/26/2015 revealed an ejection fraction of 55-60%.  Echo on 11/25/2015 revealed an EF of 60-65%.  Echo on 02/23/2016 and 05/25/2016 revealed an EF of 55-65%.  Echo on 08/31/2016 revealed an EF of 50-55%.  Echo on 11/29/2016 revealed an EF of 60-65%.  She underwent reconstructive surgery on 10/10/2016.  Last surgery is scheduled for 01/11/2017.  She has a family history of breast and ovarian cancer.  My Risk genetic testing revealed no mutation on 08/22/2015.  Symptomatically, she feels good.  Vasomotor symptoms are well controlled on Effexor.  She has chronic constipation and takes Miralax daily.  Exam is unremarkable.  She has hypokalemia (3.3).  Plan: 1.  Labs today:  CBC with diff, CMP, CA27.29. 2.  Review echocardiogram.  EF is normal. 3.  Discuss initiation of neratinib.  Discuss treatment for 1 year.  Side effects reviewed in detail.  Discussed management of  diarrhea.  Discuss potential hepatotoxicity.  Discuss checking liver function tests monthly x 3 then every 3 months.  Patient consented to treatment. 4.  Discuss chronic constipation and her daily use of Miralax.  Discuss diarrhea as main side effect of neratinib.  Discuss standard Loperamide prophylaxis:  week 1-2:  4 mg TID; week 3-8:  4 mg BID; week 9-52:  4 mg prn (not to exceed 16 mg/day).  As patient has chronic constipation, discuss holding Miralax and not starting Loperamide unless needed.  Titrate dose per bowels. 5.  Neratinib teaching with Mallie Snooks, RN today. 6.  Rx: tamoxifen 20 mg po q day. 7.  Rx:  potassium 10 meq BID x 2 days only (dis: #20 if needed later). 8.  RTC in 2 weeks for MD assessment and labs (CBC with diff, CMP).   Lequita Asal, MD  12/06/2016, 2:34 PM

## 2016-12-06 NOTE — Progress Notes (Signed)
START ON PATHWAY REGIMEN - Breast     A cycle is every 28 days:     Neratinib        Dose Mod: None  **Always confirm dose/schedule in your pharmacy ordering system**    Patient Characteristics: Extended Adjuvant Therapy (Following Trastuzumab-based Adjuvant Therapy), HER2/neu Positive, ER Positive AJCC Stage Grouping: IIB Current Disease Status: No Distant Mets or Local Recurrence AJCC M Stage: 0 ER Status: Positive (+) AJCC N Stage: 1a AJCC T Stage: 2 HER2/neu: Positive (+) PR Status: Positive (+)  Intent of Therapy: Curative Intent, Discussed with Patient

## 2016-12-06 NOTE — Telephone Encounter (Signed)
Discussed side effects of medication, usage and safety of neratinib with patient, voiced understanding, consent signed.

## 2016-12-06 NOTE — Progress Notes (Signed)
S: c/o sore throat for 2 days, denies fever/chills/cough/cp/sob, no body aches, no known exposure to strep  O: vitals wnl, nad, throat red, small ulcers on left tonsil, neck supple no lymph, lungs c t a, cv rrr  A: viral pharyngitis  P: dukes magic mouthwash, no lidocaine

## 2016-12-07 LAB — CANCER ANTIGEN 27.29: CA 27.29: 16.3 U/mL (ref 0.0–38.6)

## 2016-12-11 ENCOUNTER — Telehealth: Payer: Self-pay | Admitting: *Deleted

## 2016-12-11 MED ORDER — ONDANSETRON HCL 4 MG PO TABS: 4.0000 mg | ORAL_TABLET | Freq: Three times a day (TID) | ORAL | 0 refills | Status: DC | PRN

## 2016-12-11 NOTE — Telephone Encounter (Signed)
OK per Dr Janese Banks Rx escribed to pharmacy patient informed

## 2016-12-11 NOTE — Telephone Encounter (Signed)
Asking if she can take ondansetron being on Nerlynx as she is having nausea

## 2016-12-12 ENCOUNTER — Telehealth: Payer: Self-pay | Admitting: Pharmacist

## 2016-12-12 NOTE — Telephone Encounter (Signed)
Initial Oral Chemotherapy Pharmacist Education - Neratinib (Nerlynx)  Emma Atkinson is a 51 yo female diagnosed with breast cancer, ER+ and HER2+. Patient was called on 12/11/16 to discuss new medication, neratinib.  - Medication/dose: Neratinib 220m (6 tabs) PO daily continuously  - Start date/med received date: started taking 12/07/16 - Indication and goal of therapy: breast cancer, HER2+, prevent cancer progression  - Administration: Take with food, has been taking with breakfast daily - Adverse Effects: Discussed side effects including but not limited to  -- Diarrhea: Previously had trouble with constipation, but did start having some diarrhea on Sunday, 2/25. She said it was 3-4 bowel movements/day mostly in the evenings. She had not started taking her loperamide yet due to concern of becoming constipated. Recommended that she start taking it, starting with 464mPO daily and could increase up to TID in order to have 1-2 bowel movements/day and to call clinic if loperamide is not helping. Discussed importance of staying ahead of diarrhea and staying hydrated.  -- Hepatic impairment (labs) -- Has been having some nausea/GERD/decreased appetite. Is using Tums and ondansetron to help with this - monitor. - Drug Interactions: Drug interactions noted with neratinib and patients current Epic med list:  -- Dexlansoprazole: contraindicated with neratinib, educated patient to use Tums prn (separated by 3 hours) if needed for GERD symptoms  -- Ondansetron: neratinib can increase serum concentrations of ondansetron, using prn and not at max dose - monitor.  -- Fluconazole: completed course, removed from med list. - Handling precautions: Handle with caution and wash hands after handling.  Patient understands what we discussed today about her new medication, and understands to call with and additional questions or concerns that may arise.   MaDemetrius CharityPharmD Acute Care Pharmacy Resident  Pager:  [phone]/28/2018

## 2016-12-19 ENCOUNTER — Telehealth: Payer: Self-pay | Admitting: Pharmacist

## 2016-12-19 ENCOUNTER — Telehealth: Payer: Self-pay | Admitting: *Deleted

## 2016-12-19 NOTE — Telephone Encounter (Signed)
Oral Chemotherapy Follow Up Encounter - Neratinib   Ms. Emma Atkinson was called on 12/19/2016 to discuss her medication Neratinib.   - Call Type: Follow up call #1  - Medication (how patient has been taking): Takes 240 mg PO daily with food.  - Issues with obtaining medication (cost, insurance coverage, etc.): None, first copay was $10 and patient was pleased with this.  - Adherence Techniques: Keeps bottle out to remind her to take it  - Number of missed doses: None  - Adverse Effects experienced  -- Some diarrhea - she has been needing about 2mg  of loperamide per day, but is working on balancing baseline constipation with diarrhea. She said diarrhea tends to happen moreso at night so could try taking 2mg  loperamide late afternoon to prevent this. Stated that is has gotten better than last time we spoke.  -- Some GERD - she has been trying not to take any medication for this. Told her if needed she can take Tums as long as it is separated by 3 hours from oral chemo -- Some decreased appetite - discussed eating smaller, more frequent meals and staying hydrated. She is actively working on optimizing her diet.  Demetrius Charity, PharmD Acute Care Pharmacy Resident  Pager: [phone] 12/19/2016

## 2016-12-19 NOTE — Telephone Encounter (Signed)
Called patient to inquire on how she was doing with the neratinib, reports some diarrhea and some weight loss, but did take the imodium and it made her very constipated and took 2 tabs, but then tried to take just 1 imodium and that worked better.  Appetite is decreased, and took zofran for nausea. Patient to see MD tomorrow and informed that the refill for neratinib was sent to pharmacy. Pt verbalized understanding.

## 2016-12-20 ENCOUNTER — Inpatient Hospital Stay: Payer: 59

## 2016-12-20 ENCOUNTER — Inpatient Hospital Stay: Payer: 59 | Attending: Hematology and Oncology | Admitting: Hematology and Oncology

## 2016-12-20 ENCOUNTER — Encounter: Payer: Self-pay | Admitting: Hematology and Oncology

## 2016-12-20 VITALS — BP 149/78 | HR 92 | Temp 98.6°F | Resp 18 | Wt 107.6 lb

## 2016-12-20 DIAGNOSIS — Z170 Estrogen receptor positive status [ER+]: Secondary | ICD-10-CM

## 2016-12-20 DIAGNOSIS — C50811 Malignant neoplasm of overlapping sites of right female breast: Secondary | ICD-10-CM | POA: Diagnosis not present

## 2016-12-20 DIAGNOSIS — E876 Hypokalemia: Secondary | ICD-10-CM

## 2016-12-20 DIAGNOSIS — R0602 Shortness of breath: Secondary | ICD-10-CM | POA: Insufficient documentation

## 2016-12-20 DIAGNOSIS — R110 Nausea: Secondary | ICD-10-CM | POA: Insufficient documentation

## 2016-12-20 DIAGNOSIS — Z79810 Long term (current) use of selective estrogen receptor modulators (SERMs): Secondary | ICD-10-CM | POA: Diagnosis not present

## 2016-12-20 DIAGNOSIS — Z9013 Acquired absence of bilateral breasts and nipples: Secondary | ICD-10-CM | POA: Diagnosis not present

## 2016-12-20 DIAGNOSIS — Z87891 Personal history of nicotine dependence: Secondary | ICD-10-CM | POA: Insufficient documentation

## 2016-12-20 DIAGNOSIS — R630 Anorexia: Secondary | ICD-10-CM | POA: Insufficient documentation

## 2016-12-20 DIAGNOSIS — Z9221 Personal history of antineoplastic chemotherapy: Secondary | ICD-10-CM | POA: Diagnosis not present

## 2016-12-20 DIAGNOSIS — F329 Major depressive disorder, single episode, unspecified: Secondary | ICD-10-CM | POA: Insufficient documentation

## 2016-12-20 DIAGNOSIS — K5900 Constipation, unspecified: Secondary | ICD-10-CM

## 2016-12-20 DIAGNOSIS — Z87440 Personal history of urinary (tract) infections: Secondary | ICD-10-CM | POA: Diagnosis not present

## 2016-12-20 DIAGNOSIS — Z8041 Family history of malignant neoplasm of ovary: Secondary | ICD-10-CM | POA: Insufficient documentation

## 2016-12-20 DIAGNOSIS — R197 Diarrhea, unspecified: Secondary | ICD-10-CM | POA: Diagnosis not present

## 2016-12-20 DIAGNOSIS — G629 Polyneuropathy, unspecified: Secondary | ICD-10-CM | POA: Insufficient documentation

## 2016-12-20 DIAGNOSIS — Z803 Family history of malignant neoplasm of breast: Secondary | ICD-10-CM | POA: Insufficient documentation

## 2016-12-20 DIAGNOSIS — F419 Anxiety disorder, unspecified: Secondary | ICD-10-CM | POA: Insufficient documentation

## 2016-12-20 DIAGNOSIS — Z79899 Other long term (current) drug therapy: Secondary | ICD-10-CM | POA: Diagnosis not present

## 2016-12-20 DIAGNOSIS — K219 Gastro-esophageal reflux disease without esophagitis: Secondary | ICD-10-CM | POA: Insufficient documentation

## 2016-12-20 LAB — CBC WITH DIFFERENTIAL/PLATELET
Basophils Absolute: 0 10*3/uL (ref 0–0.1)
Basophils Relative: 0 %
Eosinophils Absolute: 0.1 10*3/uL (ref 0–0.7)
Eosinophils Relative: 2 %
HCT: 33.3 % — ABNORMAL LOW (ref 35.0–47.0)
Hemoglobin: 11.1 g/dL — ABNORMAL LOW (ref 12.0–16.0)
Lymphocytes Relative: 22 %
Lymphs Abs: 1.3 10*3/uL (ref 1.0–3.6)
MCH: 30 pg (ref 26.0–34.0)
MCHC: 33.3 g/dL (ref 32.0–36.0)
MCV: 90 fL (ref 80.0–100.0)
Monocytes Absolute: 0.3 10*3/uL (ref 0.2–0.9)
Monocytes Relative: 6 %
Neutro Abs: 4.2 10*3/uL (ref 1.4–6.5)
Neutrophils Relative %: 70 %
Platelets: 315 10*3/uL (ref 150–440)
RBC: 3.7 MIL/uL — ABNORMAL LOW (ref 3.80–5.20)
RDW: 13.6 % (ref 11.5–14.5)
WBC: 6 10*3/uL (ref 3.6–11.0)

## 2016-12-20 LAB — COMPREHENSIVE METABOLIC PANEL
ALT: 30 U/L (ref 14–54)
AST: 29 U/L (ref 15–41)
Albumin: 3.9 g/dL (ref 3.5–5.0)
Alkaline Phosphatase: 59 U/L (ref 38–126)
Anion gap: 8 (ref 5–15)
BUN: 11 mg/dL (ref 6–20)
CO2: 29 mmol/L (ref 22–32)
Calcium: 9 mg/dL (ref 8.9–10.3)
Chloride: 101 mmol/L (ref 101–111)
Creatinine, Ser: 0.73 mg/dL (ref 0.44–1.00)
GFR calc Af Amer: >60 mL/min (ref >60)
GFR calc non Af Amer: >60 mL/min (ref >60)
Glucose, Bld: 110 mg/dL — ABNORMAL HIGH (ref 65–99)
Potassium: 2.9 mmol/L — ABNORMAL LOW (ref 3.5–5.1)
Sodium: 138 mmol/L (ref 135–145)
Total Bilirubin: 0.5 mg/dL (ref 0.3–1.2)
Total Protein: 6.7 g/dL (ref 6.5–8.1)

## 2016-12-20 MED ORDER — POTASSIUM CHLORIDE ER 10 MEQ PO TBCR: 10.0000 meq | EXTENDED_RELEASE_TABLET | Freq: Two times a day (BID) | ORAL | 1 refills | Status: DC

## 2016-12-20 NOTE — Progress Notes (Signed)
[facility] day:  12/20/16   Chief Complaint: Emma Atkinson is a 51 y.o. female with clinical stage IIB Her2/neu+ right breast cancer on tamoxifen who is seen for 2 week assessment on neratinib.  HPI:  The patient was last seen in the medical oncology on 12/06/2016.  At that time, we discussed initiation of neratinib.  At baseline, she had chronic constipation and took Miralax daily (sometimes BID).  We discussed holding Miralax and not starting Imodium unless needed.    She describes 2 days on neratinib without issues.  The Friday after initiation, she had a week of diarrhea (3 x/day).  On 12/14/2016, she took 2 Imodium then developed constipation with cramping all night.  She then had diarrhea on Saturday and took 1 Imodium.  She then had no stool for 2 days (Sunday and Monday).  Late Tuesday, she had diarrhea.  She describes having 3 stools this morning. She took 1 Imodium.  She has had a poor appetite.  She has had some nausea.   Past Medical History:  Diagnosis Date  . Anemia    H/O  . Anxiety   . Breast cancer ([place])   . Chronic kidney disease 12-2015   PYLONEPHRITIS  . Complication of anesthesia    HARD TO WAKE UP AFTER TONSILLECTOMY AS A CHILD BUT NO PROBLEMS SINCE  . Complication of anesthesia    PT STATES THAT BACK IN OR DURING PORT PLACMENT IN 2016 THAT SHE BECAME VEERY FLUSHED, LIGHT HEADED AND CRNA SAID ANTIBIOTIC MAY BE GOING IN IN TO FAST-RATE DECREASED AND PTS SYMPTOMS RESOLVED IMMEDIATELY  . Depression   . GERD (gastroesophageal reflux disease)   . Shortness of breath dyspnea     Past Surgical History:  Procedure Laterality Date  . ABDOMINAL HYSTERECTOMY  2001  . BACK SURGERY     FOR SCOLIOSIS-HERRINGTON RODS IN PLACE  . BREAST BIOPSY Bilateral    08/17/2015   . PORTACATH PLACEMENT N/A 08/30/2015   Procedure: INSERTION PORT-A-CATH;  Surgeon: Leonie Green, MD;  Location: [facility] ORS;  Service: General;  Laterality:  N/A;  . SENTINEL NODE BIOPSY Bilateral 03/08/2016   Procedure: SENTINEL NODE BIOPSY;  Surgeon: Leonie Green, MD;  Location: [facility] ORS;  Service: General;  Laterality: Bilateral;  . SIMPLE MASTECTOMY WITH AXILLARY SENTINEL NODE BIOPSY Bilateral 03/08/2016   Procedure: SIMPLE MASTECTOMY;  Surgeon: Leonie Green, MD;  Location: [facility] ORS;  Service: General;  Laterality: Bilateral;  . TONSILLECTOMY      Family History  Problem Relation Age of Onset  . Breast cancer Maternal Aunt 64  . Cancer Father   . Cancer Sister   . Cancer Maternal Grandfather     Social History:  reports that she quit smoking about 17 years ago. Her smoking use included Cigarettes. She has a 10.00 pack-year smoking history. She has never used smokeless tobacco. She reports that she drinks alcohol. She reports that she does not use drugs.  She returned to full-time work on 10/24/2016.  The patient is alone today.  Allergies:  Allergies  Allergen Reactions  . Ceftin [Cefuroxime Axetil] Rash    Current Medications: Current Outpatient Prescriptions  Medication Sig Dispense Refill  . acetaminophen (TYLENOL) 500 MG tablet Take 1,000 mg by mouth every 6 (six) hours as needed.    . ALPRAZolam (XANAX) 0.25 MG tablet Take 1/4 tablet up to three times daily QHS    . Dexlansoprazole (DEXILANT) 30 MG capsule Take 30 mg  by mouth as needed.     . diphenhydrAMINE (SOMINEX) 25 MG tablet Take 25 mg by mouth at bedtime. Reported on 01/12/2016    . HYDROcodone-acetaminophen (NORCO/VICODIN) 5-325 MG tablet Take 1-2 tablets by mouth every 4 (four) hours as needed for moderate pain. 16 tablet 0  . lidocaine-prilocaine (EMLA) cream Apply 1 application topically as needed. Apply at least 1-2 hours before the port is to be used. 30 g 1  . magic mouthwash SOLN Take 5 mLs by mouth 3 (three) times daily as needed for mouth pain. 150 mL 5  . Multiple Vitamin (MULTIVITAMIN) capsule Take 1 capsule by mouth daily.    . Neratinib Maleate  (NERLYNX) 40 MG tablet Take 6 tablets (240 mg total) by mouth daily. Take with food. 180 tablet 0  . ondansetron (ZOFRAN) 4 MG tablet Take 1 tablet (4 mg total) by mouth every 8 (eight) hours as needed for nausea or vomiting. 30 tablet 0  . potassium chloride (K-DUR) 10 MEQ tablet Take 1 tablet (10 mEq total) by mouth 2 (two) times daily. for 2 days 20 tablet 1  . tamoxifen (NOLVADEX) 20 MG tablet Take 1 tablet (20 mg total) by mouth daily. 30 tablet 5  . venlafaxine XR (EFFEXOR-XR) 37.5 MG 24 hr capsule Take 1 capsule (37.5 mg total) by mouth daily with breakfast. 30 capsule 3   No current facility-administered medications for this visit.     Review of Systems:  GENERAL:  Feels "ok".  No fevers or sweats.  Weight down 3 pounds. PERFORMANCE STATUS (ECOG):  0 HEENT:  No visual changes, runny nose, sore throat, mouth sores or tenderness. Lungs: No shortness of breath or cough.  No pleuritic chest pain.  No hemoptysis. Cardiac:  No chest pain, palpitations, orthopnea, or PND. GI:  Constipation and diarrhea (see HPI).  Poor appetite.  Nausea.  No vomiting, diarrhea, melena or hematochezia. GU:  No urgency, frequency, dysuria or hematuria.  Musculoskeletal:  No back pain.  No joint pain.  No muscle tenderness. Extremities:  No pain or swelling. Skin:  No rashes or skin changes. Neuro:  No numbness, weakness, balance or coordination issues. Endocrine:  Mild hot flashes.  No diabetes, thyroid issues, or night sweats. Psych:  Poor sleep.  No mood changes. Pain:  No focal pain. Review of systems:  All other systems reviewed and found to be negative.  Physical Exam: Blood pressure (!) 149/78, pulse 92, temperature 98.6 F (37 C), temperature source Tympanic, resp. rate 18, weight 107 lb 9.4 oz (48.8 kg). O2 sats 94% with ambulation. GENERAL:  Thin tan, slightly fatigued appearing, woman sitting comfortably in the exam room in no acute distress.  MENTAL STATUS:  Alert and oriented to person, place  and time. HEAD:  Short styled hair.  Normocephalic, atraumatic, face symmetric (thin), no Cushingoid features. EYES:  Blue eyes.  Pupils equal round and reactive to light and accomodation.  No conjunctivitis or scleral icterus. ENT:  Oropharynx clear without lesion.  Tongue normal. Mucous membranes moist.  RESPIRATORY:  Clear to auscultation without rales, wheezes or rhonchi. CARDIOVASCULAR:  Regular rate and rhythm without murmur, rub or gallop. No JVD. ABDOMEN:  Soft, non-tender, with active bowel sounds, and no hepatosplenomegaly.  No masses. SKIN:  Tan.  No rashes, ulcers or lesions. EXTREMITIES: No edema, no skin discoloration or tenderness.  No palpable cords. LYMPH NODES: No palpable cervical, supraclavicular, axillary or inguinal adenopathy  NEUROLOGICAL: Unremarkable. PSYCH:  Appropriate.    Appointment on 12/20/2016  Component  Date Value Ref Range Status  . WBC 12/20/2016 6.0  3.6 - 11.0 K/uL Final  . RBC 12/20/2016 3.70* 3.80 - 5.20 MIL/uL Final  . Hemoglobin 12/20/2016 11.1* 12.0 - 16.0 g/dL Final  . HCT 12/20/2016 33.3* 35.0 - 47.0 % Final  . MCV 12/20/2016 90.0  80.0 - 100.0 fL Final  . MCH 12/20/2016 30.0  26.0 - 34.0 pg Final  . MCHC 12/20/2016 33.3  32.0 - 36.0 g/dL Final  . RDW 12/20/2016 13.6  11.5 - 14.5 % Final  . Platelets 12/20/2016 315  150 - 440 K/uL Final  . Neutrophils Relative % 12/20/2016 70  % Final  . Neutro Abs 12/20/2016 4.2  1.4 - 6.5 K/uL Final  . Lymphocytes Relative 12/20/2016 22  % Final  . Lymphs Abs 12/20/2016 1.3  1.0 - 3.6 K/uL Final  . Monocytes Relative 12/20/2016 6  % Final  . Monocytes Absolute 12/20/2016 0.3  0.2 - 0.9 K/uL Final  . Eosinophils Relative 12/20/2016 2  % Final  . Eosinophils Absolute 12/20/2016 0.1  0 - 0.7 K/uL Final  . Basophils Relative 12/20/2016 0  % Final  . Basophils Absolute 12/20/2016 0.0  0 - 0.1 K/uL Final  . Sodium 12/20/2016 138  135 - 145 mmol/L Final  . Potassium 12/20/2016 2.9* 3.5 - 5.1 mmol/L Final  .  Chloride 12/20/2016 101  101 - 111 mmol/L Final  . CO2 12/20/2016 29  22 - 32 mmol/L Final  . Glucose, Bld 12/20/2016 110* 65 - 99 mg/dL Final  . BUN 12/20/2016 11  6 - 20 mg/dL Final  . Creatinine, Ser 12/20/2016 0.73  0.44 - 1.00 mg/dL Final  . Calcium 12/20/2016 9.0  8.9 - 10.3 mg/dL Final  . Total Protein 12/20/2016 6.7  6.5 - 8.1 g/dL Final  . Albumin 12/20/2016 3.9  3.5 - 5.0 g/dL Final  . AST 12/20/2016 29  15 - 41 U/L Final  . ALT 12/20/2016 30  14 - 54 U/L Final  . Alkaline Phosphatase 12/20/2016 59  38 - 126 U/L Final  . Total Bilirubin 12/20/2016 0.5  0.3 - 1.2 mg/dL Final  . GFR calc non Af Amer 12/20/2016 >60  >60 mL/min Final  . GFR calc Af Amer 12/20/2016 >60  >60 mL/min Final   Comment: (NOTE) The eGFR has been calculated using the CKD EPI equation. This calculation has not been validated in all clinical situations. eGFR's persistently <60 mL/min signify possible Chronic Kidney Disease.   . Anion gap 12/20/2016 8  5 - 15 Final    Assessment:  CARI BURGO is a 51 y.o. female with clinical stage T2N1 (stage IIB) Her2/neu + right breast cancer s/p neoadjuvant chemotherapy and bilateral mastectomy.  She completed a year of adjuvant Herceptin.  She presented with a minimally painful right breast mass with nipple inversion.  Bilateral mammogram and ultrasound on 08/12/2015 revealed a 4.1 cm right superior breast mass containing pleomorphic microcalcifications. There were several satellite lesions (1.3 cm, 0.9 cm, 0.6 cm) plus a 1.3 cm mass versus complicated cyst and a 0.8 cm cyst.  There was one 5 mm right axillary node with irregular thickened cortex.   Core needle biopsy on 08/17/2015 of the right breast mass revealed a grade 3 invasive mammary carcinoma. Right axillary biopsy confirmed metastatic mammary carcinoma. Tumor was ER positive (1-10%), PR positive (11-50%) and HER-2/neu 3+  PET scan on 08/26/2015 revealed hypermetabolic right breast mass (SUV 11.3) with a  hypermetabolic right axillary lymph node and a  subtle focus of very faint hypermetabolic activity laterally in the right breast. There was faint hypermetabolic activity at the biopsy site of the left breast (? biopsy related).    CA27.29 has been followed: 19.6 on 08/22/2015, 24.7 on 01/10/2016, 15.5 on 03/23/2016, and 16.3 on 12/06/2016.  She received 4 cycles of AC (09/13/2015 - 11/08/2015) with Neulasta support.  She received 4 cycles of Herceptin and Perjeta (11/29/2015 - 01/31/2016) and 12 weeks of Taxol (11/29/2015 - 02/14/2016).  She tolerated her chemotherapy well.  She developed a grade I neuropathy.  She completed a year of adjuvant Herceptin on 11/15/2016.  She began tamoxifen on 07/05/2016.  She is on Effexor for vasomotor symptoms.  Bilateral mastectomy on 03/08/2016 revealed no residual disease in the right breast.  Five lymph nodes were negative for malignancy.  Pathologic stage was ypT0 ypN0.  Left breast revealed benign intraductal papillomas and cysts adjacent to the biopsy site.  There was no atypia or malignancy.  She is considering reconstruction in 09/2016 or 10/2016.  She received 50.4 Gy right chest wall radiation from 05/07/2016 - 06/25/2016.  She is planning reconstruction at the end of 09/2016 or beginning of 10/2016.  She is s/p partial hysterectomy (ovaries remain) in 2001.  She was premenopausal (Bunkie 5 and estradiol 271.9) on 08/22/2015.  Echo on 08/26/2015 revealed an ejection fraction of 55-60%.  Echo on 11/25/2015 revealed an EF of 60-65%.  Echo on 02/23/2016 and 05/25/2016 revealed an EF of 55-65%.  Echo on 08/31/2016 revealed an EF of 50-55%.  Echo on 11/29/2016 revealed an EF of 60-65%.  She underwent reconstructive surgery on 10/10/2016.  Last surgery is scheduled for 01/11/2017.  She has a family history of breast and ovarian cancer.  My Risk genetic testing revealed no mutation on 08/22/2015.  She began neratinib on 12/06/2016.  She has had issues with  constipation (baseline on Miralax) and diarrhea.  Symptomatically, she is fatigued.  Appetite is decreased.  She has had some nausea.  She has had diarrhea with constipation after small doses of Imodium.  She has hypokalemia (2.9).  Plan: 1.  Labs today:  CBC with diff, CMP. 2.  Review see-saw pattern of constipation and diarrhea.  Discuss titrating dose of Imodium.  Since both 1 and 2 Imodium result in constipation, discuss taking 1/2 Imodium.  Discuss consideration of using liquid or pediatric Imodium for further dose reduction.  Difficult balance given patient's chronic issues with constipation and daily use of Miralax. 3.  Discuss hypokalemia secondary to diarrhea.  Discuss taking potassium 20 meq this afternoon and tonight.  Take 20 meq in AM.  Follow-up BMP tomorrow before weekend to make further decisions about potassium supplementation. 4.  Discus caloric and fluid intake.  Discuss nausea and management. 5.  Discuss patient's thoughts about continuation of neratinib.  She wishes to continue. 6.  Rx: potassium chloride 10 meq tablets (dis: #40). 7.  RTC tomorrow for BMP.  Contact patient with results and any adjustment in potassium. 8.  RTC in 1 week for BMP. 9.  RTC in 2 weeks for MD assess and labs (CMP).  Addendum:  Potassium was 4.0 on 12/21/2016.  Patient told to hold potassium.   Lequita Asal, MD  12/20/2016, 2:09 PM

## 2016-12-20 NOTE — Progress Notes (Signed)
Patient started Nerlynx on 12-07-16.  States she is having diarrhea which she has controlled with imodium.  She is having some nausea and decreased appetite.  Also states she is very fatigued.

## 2016-12-21 ENCOUNTER — Inpatient Hospital Stay: Payer: 59

## 2016-12-21 DIAGNOSIS — R197 Diarrhea, unspecified: Secondary | ICD-10-CM | POA: Diagnosis not present

## 2016-12-21 DIAGNOSIS — R630 Anorexia: Secondary | ICD-10-CM | POA: Diagnosis not present

## 2016-12-21 DIAGNOSIS — C50811 Malignant neoplasm of overlapping sites of right female breast: Secondary | ICD-10-CM | POA: Diagnosis not present

## 2016-12-21 DIAGNOSIS — G629 Polyneuropathy, unspecified: Secondary | ICD-10-CM | POA: Diagnosis not present

## 2016-12-21 DIAGNOSIS — Z170 Estrogen receptor positive status [ER+]: Secondary | ICD-10-CM | POA: Diagnosis not present

## 2016-12-21 DIAGNOSIS — Z79810 Long term (current) use of selective estrogen receptor modulators (SERMs): Secondary | ICD-10-CM | POA: Diagnosis not present

## 2016-12-21 DIAGNOSIS — R110 Nausea: Secondary | ICD-10-CM | POA: Diagnosis not present

## 2016-12-21 DIAGNOSIS — E876 Hypokalemia: Secondary | ICD-10-CM | POA: Diagnosis not present

## 2016-12-21 DIAGNOSIS — K5900 Constipation, unspecified: Secondary | ICD-10-CM | POA: Diagnosis not present

## 2016-12-21 LAB — BASIC METABOLIC PANEL
Anion gap: 5 (ref 5–15)
BUN: 11 mg/dL (ref 6–20)
CO2: 28 mmol/L (ref 22–32)
Calcium: 9.2 mg/dL (ref 8.9–10.3)
Chloride: 104 mmol/L (ref 101–111)
Creatinine, Ser: 0.71 mg/dL (ref 0.44–1.00)
GFR calc Af Amer: >60 mL/min (ref >60)
GFR calc non Af Amer: >60 mL/min (ref >60)
Glucose, Bld: 89 mg/dL (ref 65–99)
Potassium: 4 mmol/L (ref 3.5–5.1)
Sodium: 137 mmol/L (ref 135–145)

## 2016-12-24 ENCOUNTER — Ambulatory Visit
Admission: RE | Admit: 2016-12-24 | Discharge: 2016-12-24 | Disposition: A | Discharge status: Home / Self Care | Payer: 59 | Source: Ambulatory Visit | Attending: Radiation Oncology | Admitting: Radiation Oncology

## 2016-12-24 ENCOUNTER — Encounter: Payer: Self-pay | Admitting: Radiation Oncology

## 2016-12-24 VITALS — BP 126/83 | HR 96 | Temp 98.7°F | Resp 18 | Wt 106.2 lb

## 2016-12-24 DIAGNOSIS — Z923 Personal history of irradiation: Secondary | ICD-10-CM | POA: Diagnosis not present

## 2016-12-24 DIAGNOSIS — Z9013 Acquired absence of bilateral breasts and nipples: Secondary | ICD-10-CM | POA: Insufficient documentation

## 2016-12-24 DIAGNOSIS — C50111 Malignant neoplasm of central portion of right female breast: Secondary | ICD-10-CM | POA: Diagnosis not present

## 2016-12-24 DIAGNOSIS — Z171 Estrogen receptor negative status [ER-]: Secondary | ICD-10-CM | POA: Diagnosis not present

## 2016-12-24 DIAGNOSIS — Z170 Estrogen receptor positive status [ER+]: Secondary | ICD-10-CM

## 2016-12-24 NOTE — Progress Notes (Signed)
Radiation Oncology Follow up Note  Name: Emma Atkinson   Date:   12/24/2016 MRN:  [id number] DOB: January 21, 1966    This 51 y.o. female presents to the clinic today for 6 month follow-up status post adjuvant radiation therapy to her right chest wall peripheral lymphatics for stage IIIB invasive mammary carcinoma.  REFERRING PROVIDER: Idelle Crouch, MD  HPI: Patient is a 51 year old female now out 6 months having completed radiation therapy to her right chest wall and peripheral lymphatics for stage IIIB (T4 N1 M0) grade 3 invasive mammary carcinoma triple positive. She had neoadjuvant chemotherapy followed by bilateral mastectomies. She is currently in the process of breast reconstruction which is going well. She does not have follow-up mammograms since she is status post bilateral mastectomies. She specifically denies chest wall pain cough or bone pain..  COMPLICATIONS OF TREATMENT: none  FOLLOW UP COMPLIANCE: keeps appointments   PHYSICAL EXAM:  BP 126/83   Pulse 96   Temp 98.7 F (37.1 C)   Resp 18   Wt 106 lb 2.4 oz (48.1 kg)   BMI 20.06 kg/m  Patient is status post bilateral breast reconstruction. She does have expanders in her breast tissue causing some tension of the skin. No evidence of mass or nodularity in the mastectomy scars axilla or supra clavicular area is noted. Well-developed well-nourished patient in NAD. HEENT reveals PERLA, EOMI, discs not visualized.  Oral cavity is clear. No oral mucosal lesions are identified. Neck is clear without evidence of cervical or supraclavicular adenopathy. Lungs are clear to A&P. Cardiac examination is essentially unremarkable with regular rate and rhythm without murmur rub or thrill. Abdomen is benign with no organomegaly or masses noted. Motor sensory and DTR levels are equal and symmetric in the upper and lower extremities. Cranial nerves II through XII are grossly intact. Proprioception is intact. No peripheral adenopathy or edema is  identified. No motor or sensory levels are noted. Crude visual fields are within normal range.  RADIOLOGY RESULTS: No current films for review  PLAN: Present time she is doing well. She is completing stages for breast reconstruction. She's pleased with the results at this time. I have asked to see her out in 1 year for follow-up. Patient knows to call sooner with any concerns. She has completed a year of adjuvant Herceptin.  I would like to take this opportunity to thank you for allowing me to participate in the care of your patient.Armstead Peaks., MD

## 2016-12-27 ENCOUNTER — Inpatient Hospital Stay: Payer: 59

## 2016-12-27 ENCOUNTER — Encounter: Payer: Self-pay | Admitting: Hematology and Oncology

## 2016-12-27 ENCOUNTER — Telehealth: Payer: Self-pay | Admitting: *Deleted

## 2016-12-27 VITALS — BP 142/87 | HR 89

## 2016-12-27 DIAGNOSIS — R112 Nausea with vomiting, unspecified: Secondary | ICD-10-CM

## 2016-12-27 DIAGNOSIS — R110 Nausea: Secondary | ICD-10-CM | POA: Diagnosis not present

## 2016-12-27 DIAGNOSIS — R630 Anorexia: Secondary | ICD-10-CM | POA: Diagnosis not present

## 2016-12-27 DIAGNOSIS — R197 Diarrhea, unspecified: Secondary | ICD-10-CM | POA: Diagnosis not present

## 2016-12-27 DIAGNOSIS — Z170 Estrogen receptor positive status [ER+]: Secondary | ICD-10-CM | POA: Diagnosis not present

## 2016-12-27 DIAGNOSIS — G629 Polyneuropathy, unspecified: Secondary | ICD-10-CM | POA: Diagnosis not present

## 2016-12-27 DIAGNOSIS — Z95828 Presence of other vascular implants and grafts: Secondary | ICD-10-CM

## 2016-12-27 DIAGNOSIS — E876 Hypokalemia: Secondary | ICD-10-CM | POA: Diagnosis not present

## 2016-12-27 DIAGNOSIS — C50811 Malignant neoplasm of overlapping sites of right female breast: Secondary | ICD-10-CM | POA: Diagnosis not present

## 2016-12-27 DIAGNOSIS — Z79810 Long term (current) use of selective estrogen receptor modulators (SERMs): Secondary | ICD-10-CM | POA: Diagnosis not present

## 2016-12-27 DIAGNOSIS — K5900 Constipation, unspecified: Secondary | ICD-10-CM | POA: Diagnosis not present

## 2016-12-27 LAB — CBC WITH DIFFERENTIAL/PLATELET
Basophils Absolute: 0 10*3/uL (ref 0–0.1)
Basophils Relative: 0 %
Eosinophils Absolute: 0.1 10*3/uL (ref 0–0.7)
Eosinophils Relative: 1 %
HCT: 34 % — ABNORMAL LOW (ref 35.0–47.0)
Hemoglobin: 11.5 g/dL — ABNORMAL LOW (ref 12.0–16.0)
Lymphocytes Relative: 11 %
Lymphs Abs: 1.2 10*3/uL (ref 1.0–3.6)
MCH: 30.2 pg (ref 26.0–34.0)
MCHC: 34 g/dL (ref 32.0–36.0)
MCV: 88.9 fL (ref 80.0–100.0)
Monocytes Absolute: 0.6 10*3/uL (ref 0.2–0.9)
Monocytes Relative: 6 %
Neutro Abs: 8.4 10*3/uL — ABNORMAL HIGH (ref 1.4–6.5)
Neutrophils Relative %: 82 %
Platelets: 363 10*3/uL (ref 150–440)
RBC: 3.82 MIL/uL (ref 3.80–5.20)
RDW: 13.9 % (ref 11.5–14.5)
WBC: 10.2 10*3/uL (ref 3.6–11.0)

## 2016-12-27 LAB — COMPREHENSIVE METABOLIC PANEL
ALT: 20 U/L (ref 14–54)
AST: 30 U/L (ref 15–41)
Albumin: 4.1 g/dL (ref 3.5–5.0)
Alkaline Phosphatase: 50 U/L (ref 38–126)
Anion gap: 6 (ref 5–15)
BUN: 15 mg/dL (ref 6–20)
CO2: 28 mmol/L (ref 22–32)
Calcium: 9.1 mg/dL (ref 8.9–10.3)
Chloride: 99 mmol/L — ABNORMAL LOW (ref 101–111)
Creatinine, Ser: 0.78 mg/dL (ref 0.44–1.00)
GFR calc Af Amer: >60 mL/min (ref >60)
GFR calc non Af Amer: >60 mL/min (ref >60)
Glucose, Bld: 87 mg/dL (ref 65–99)
Potassium: 3.6 mmol/L (ref 3.5–5.1)
Sodium: 133 mmol/L — ABNORMAL LOW (ref 135–145)
Total Bilirubin: 0.6 mg/dL (ref 0.3–1.2)
Total Protein: 7 g/dL (ref 6.5–8.1)

## 2016-12-27 LAB — MAGNESIUM: Magnesium: 2.2 mg/dL (ref 1.7–2.4)

## 2016-12-27 MED ORDER — HEPARIN SOD (PORK) LOCK FLUSH 100 UNIT/ML IV SOLN
500.0000 [IU] | Freq: Once | INTRAVENOUS | Status: AC
  Administered 2016-12-27: 500 [IU] via INTRAVENOUS

## 2016-12-27 MED ORDER — HEPARIN SOD (PORK) LOCK FLUSH 100 UNIT/ML IV SOLN
INTRAVENOUS | Status: AC
  Filled 2016-12-27: qty 5

## 2016-12-27 MED ORDER — SODIUM CHLORIDE 0.9% FLUSH
10.0000 mL | INTRAVENOUS | Status: DC | PRN
  Administered 2016-12-27: 10 mL via INTRAVENOUS
  Filled 2016-12-27: qty 10

## 2016-12-27 NOTE — Telephone Encounter (Signed)
Patient will come in at 11:30 for labs and wait for results to see if she needs IVF/ electrolytes. Lab and chemo informed

## 2016-12-27 NOTE — Telephone Encounter (Signed)
  She is on neratinib that can cause diarrhea. She had diarrhea before and had low potassium (last check was fixed).  I would get labs:  CBC with diff, CMP (can cause LFT abnormalities), Mg  Check orthostatics and likely give fluids +/- electrolytes.  M

## 2016-12-27 NOTE — Progress Notes (Signed)
Patient acute add on today for labs/possible IVF/anti-emetic.  Patient states she awoke last night with severe leg cramps at which time she also became lightheaded, nauseated and vomited.  Came to clinic today for labs.  Labs look okay.  Per patient she had been taking Miralax for couple of days since she had not had a BM in over a week. After the n/v stopped patient states she drank 32 oz. Gatorade.  Since the episode last night she has had no further problems. Patient did not get fluids.  Sent to infustion for port flush.

## 2016-12-27 NOTE — Telephone Encounter (Signed)
Called to report that she is dehydrated, she is having leg cramps, light headedness, nausea and vomiting. She has an appt this afternoon for port flush and lab. Please advise

## 2016-12-28 NOTE — Progress Notes (Signed)
Re:  Leg cramps, vomiting, nausea, dizziness, and dehydration.  The patient was seen in the conference room on mid-day on 12/27/2016.  She had spoken with Jarrett Soho, RN regarding symptoms.  She has taken 1 Imodium since last visit.  She then had constipation and took Miralax x 2 days.  Last night she has abdominal cramping, nausea and vomiting.  Symptoms occurred around 12:30 AM.  She felt dizzy and dehydrated.  She also noted some leg cramps.  She currently feels much better.  She denies any dizziness.  She is not orthostatic (142/87 with a pulse of 93 sitting and 147/89 with a pulse of 90 standing).  Labs reveal a normal CBC except for a hematocrit of 34 (33.3 on 12/20/2016).  Chemistries reveal a sodium of 133, potassium 3.6, BUN 15, Cr 0.78, and magnesium 2.2.  Liver function tests are normal.  The patient declines fluids or anti-emetics.  She feels much better than she did last night.  She is going to work today.  She has picked up the liquid Imodium to use.  She still wishes to continue neratinib.  I encouraged her to contact the clinic if she has any issues or concerns.   Lequita Asal, MD

## 2016-12-31 ENCOUNTER — Other Ambulatory Visit: Payer: Self-pay | Admitting: *Deleted

## 2016-12-31 DIAGNOSIS — C50811 Malignant neoplasm of overlapping sites of right female breast: Secondary | ICD-10-CM

## 2016-12-31 DIAGNOSIS — Z170 Estrogen receptor positive status [ER+]: Principal | ICD-10-CM

## 2016-12-31 NOTE — Telephone Encounter (Signed)
Patient instructed that she has 5 refills on current rx

## 2017-01-03 ENCOUNTER — Inpatient Hospital Stay: Payer: 59

## 2017-01-03 ENCOUNTER — Inpatient Hospital Stay (HOSPITAL_BASED_OUTPATIENT_CLINIC_OR_DEPARTMENT_OTHER): Payer: 59 | Admitting: Hematology and Oncology

## 2017-01-03 ENCOUNTER — Encounter: Payer: Self-pay | Admitting: Hematology and Oncology

## 2017-01-03 VITALS — BP 131/83 | HR 89 | Temp 98.7°F | Resp 18 | Wt 104.3 lb

## 2017-01-03 DIAGNOSIS — E876 Hypokalemia: Secondary | ICD-10-CM | POA: Diagnosis not present

## 2017-01-03 DIAGNOSIS — C50811 Malignant neoplasm of overlapping sites of right female breast: Secondary | ICD-10-CM | POA: Diagnosis not present

## 2017-01-03 DIAGNOSIS — Z170 Estrogen receptor positive status [ER+]: Secondary | ICD-10-CM | POA: Diagnosis not present

## 2017-01-03 DIAGNOSIS — R0602 Shortness of breath: Secondary | ICD-10-CM

## 2017-01-03 DIAGNOSIS — K5900 Constipation, unspecified: Secondary | ICD-10-CM | POA: Diagnosis not present

## 2017-01-03 DIAGNOSIS — R110 Nausea: Secondary | ICD-10-CM

## 2017-01-03 DIAGNOSIS — Z79899 Other long term (current) drug therapy: Secondary | ICD-10-CM

## 2017-01-03 DIAGNOSIS — Z79810 Long term (current) use of selective estrogen receptor modulators (SERMs): Secondary | ICD-10-CM | POA: Diagnosis not present

## 2017-01-03 DIAGNOSIS — F329 Major depressive disorder, single episode, unspecified: Secondary | ICD-10-CM

## 2017-01-03 DIAGNOSIS — K219 Gastro-esophageal reflux disease without esophagitis: Secondary | ICD-10-CM

## 2017-01-03 DIAGNOSIS — Z8041 Family history of malignant neoplasm of ovary: Secondary | ICD-10-CM

## 2017-01-03 DIAGNOSIS — R630 Anorexia: Secondary | ICD-10-CM | POA: Diagnosis not present

## 2017-01-03 DIAGNOSIS — R197 Diarrhea, unspecified: Secondary | ICD-10-CM | POA: Diagnosis not present

## 2017-01-03 DIAGNOSIS — Z87440 Personal history of urinary (tract) infections: Secondary | ICD-10-CM

## 2017-01-03 DIAGNOSIS — F419 Anxiety disorder, unspecified: Secondary | ICD-10-CM | POA: Diagnosis not present

## 2017-01-03 DIAGNOSIS — Z9013 Acquired absence of bilateral breasts and nipples: Secondary | ICD-10-CM

## 2017-01-03 DIAGNOSIS — G629 Polyneuropathy, unspecified: Secondary | ICD-10-CM | POA: Diagnosis not present

## 2017-01-03 DIAGNOSIS — Z9221 Personal history of antineoplastic chemotherapy: Secondary | ICD-10-CM

## 2017-01-03 DIAGNOSIS — Z803 Family history of malignant neoplasm of breast: Secondary | ICD-10-CM

## 2017-01-03 DIAGNOSIS — Z87891 Personal history of nicotine dependence: Secondary | ICD-10-CM

## 2017-01-03 LAB — COMPREHENSIVE METABOLIC PANEL
ALT: 29 U/L (ref 14–54)
AST: 29 U/L (ref 15–41)
Albumin: 3.7 g/dL (ref 3.5–5.0)
Alkaline Phosphatase: 45 U/L (ref 38–126)
Anion gap: 6 (ref 5–15)
BUN: 10 mg/dL (ref 6–20)
CO2: 28 mmol/L (ref 22–32)
Calcium: 9.1 mg/dL (ref 8.9–10.3)
Chloride: 104 mmol/L (ref 101–111)
Creatinine, Ser: 0.82 mg/dL (ref 0.44–1.00)
GFR calc Af Amer: >60 mL/min (ref >60)
GFR calc non Af Amer: >60 mL/min (ref >60)
Glucose, Bld: 87 mg/dL (ref 65–99)
Potassium: 3.1 mmol/L — ABNORMAL LOW (ref 3.5–5.1)
Sodium: 138 mmol/L (ref 135–145)
Total Bilirubin: 0.5 mg/dL (ref 0.3–1.2)
Total Protein: 6.4 g/dL — ABNORMAL LOW (ref 6.5–8.1)

## 2017-01-03 NOTE — Progress Notes (Signed)
Survivorship Care Plan visit completed.  Treatment summary reviewed and given to patient.  ASCO answers booklet reviewed and given to patient.  CARE program and Cancer Transitions discussed with patient along with other resources cancer center offers to patients and caregivers.  Patient verbalized understanding.    

## 2017-01-03 NOTE — Progress Notes (Signed)
Patient states she stopped taking her Nerlynx on Monday.  States she was so weak and felt so bad she decided to stop it.  She also has a daughter who is getting married soon and wants to feel good.

## 2017-01-03 NOTE — Progress Notes (Signed)
[facility] day:  01/03/17   Chief Complaint: Emma Atkinson is a 51 y.o. female with clinical stage IIB Her2/neu+ right breast cancer on tamoxifen who is seen for 2 week assessment on neratinib.  HPI:  The patient was last seen in the medical oncology on 12/20/2016.  At that time, she was fatigued.  Appetite had decreased.  She had some nausea.  She had diarrhea with constipation after small doses of Imodium.  She had hypokalemia (2.9).  She received oral potassium.  Repeat potassium was 4.0 on 12/21/2016.  We discussed obtaining liquid Imodium to titrate her dose better to avoid alternating diarrhea and constipation.  She was seen in the conference room on 12/27/2016.  The evening prior, she had leg cramping, nausea, vomiting, dizziness and dehydration. Labs included a potassium of 3.6.  Hepatic and renal function were normal.  She declined her antiemetics. She was feeling much better. She had picked up her liquid Imodium to use for titration.  At that point she wished to continue her neratinib.  She states that on 3/19 and 01/01/2017, she did not feel good. She has not had any neratinib since 01/01/2017.  Saturday she had a bowel movement. She has had no further constipation. She is afraid of taking something. She states that she does not want to keep feeling this way.  She feels good today.   Past Medical History:  Diagnosis Date  . Anemia    H/O  . Anxiety   . Breast cancer ([place])   . Chronic kidney disease 12-2015   PYLONEPHRITIS  . Complication of anesthesia    HARD TO WAKE UP AFTER TONSILLECTOMY AS A CHILD BUT NO PROBLEMS SINCE  . Complication of anesthesia    PT STATES THAT BACK IN OR DURING PORT PLACMENT IN 2016 THAT SHE BECAME VEERY FLUSHED, LIGHT HEADED AND CRNA SAID ANTIBIOTIC MAY BE GOING IN IN TO FAST-RATE DECREASED AND PTS SYMPTOMS RESOLVED IMMEDIATELY  . Depression   . GERD (gastroesophageal reflux disease)   . Shortness of breath  dyspnea     Past Surgical History:  Procedure Laterality Date  . ABDOMINAL HYSTERECTOMY  2001  . BACK SURGERY     FOR SCOLIOSIS-HERRINGTON RODS IN PLACE  . BREAST BIOPSY Bilateral    08/17/2015   . PORTACATH PLACEMENT N/A 08/30/2015   Procedure: INSERTION PORT-A-CATH;  Surgeon: Leonie Green, MD;  Location: [facility] ORS;  Service: General;  Laterality: N/A;  . SENTINEL NODE BIOPSY Bilateral 03/08/2016   Procedure: SENTINEL NODE BIOPSY;  Surgeon: Leonie Green, MD;  Location: [facility] ORS;  Service: General;  Laterality: Bilateral;  . SIMPLE MASTECTOMY WITH AXILLARY SENTINEL NODE BIOPSY Bilateral 03/08/2016   Procedure: SIMPLE MASTECTOMY;  Surgeon: Leonie Green, MD;  Location: [facility] ORS;  Service: General;  Laterality: Bilateral;  . TONSILLECTOMY      Family History  Problem Relation Age of Onset  . Breast cancer Maternal Aunt 64  . Cancer Father   . Cancer Sister   . Cancer Maternal Grandfather     Social History:  reports that she quit smoking about 17 years ago. Her smoking use included Cigarettes. She has a 10.00 pack-year smoking history. She has never used smokeless tobacco. She reports that she drinks alcohol. She reports that she does not use drugs.  She returned to full-time work on 10/24/2016.  Her daughter is getting married soon. The patient is alone today.  Allergies:  Allergies  Allergen Reactions  .  Ceftin [Cefuroxime Axetil] Rash    Current Medications: Current Outpatient Prescriptions  Medication Sig Dispense Refill  . acetaminophen (TYLENOL) 500 MG tablet Take 1,000 mg by mouth every 6 (six) hours as needed.    . ALPRAZolam (XANAX) 0.25 MG tablet Take 1/4 tablet up to three times daily QHS    . Dexlansoprazole (DEXILANT) 30 MG capsule Take 30 mg by mouth as needed.     . diphenhydrAMINE (SOMINEX) 25 MG tablet Take 25 mg by mouth at bedtime. Reported on 01/12/2016    . HYDROcodone-acetaminophen (NORCO/VICODIN) 5-325 MG tablet Take 1-2 tablets by mouth  every 4 (four) hours as needed for moderate pain. 16 tablet 0  . lidocaine-prilocaine (EMLA) cream Apply 1 application topically as needed. Apply at least 1-2 hours before the port is to be used. 30 g 1  . magic mouthwash SOLN Take 5 mLs by mouth 3 (three) times daily as needed for mouth pain. 150 mL 5  . Multiple Vitamin (MULTIVITAMIN) capsule Take 1 capsule by mouth daily.    . ondansetron (ZOFRAN) 4 MG tablet Take 1 tablet (4 mg total) by mouth every 8 (eight) hours as needed for nausea or vomiting. 30 tablet 0  . potassium chloride (K-DUR) 10 MEQ tablet Take 1 tablet (10 mEq total) by mouth 2 (two) times daily. for 2 days 40 tablet 1  . tamoxifen (NOLVADEX) 20 MG tablet Take 1 tablet (20 mg total) by mouth daily. 30 tablet 5  . venlafaxine XR (EFFEXOR-XR) 37.5 MG 24 hr capsule Take 1 capsule (37.5 mg total) by mouth daily with breakfast. 30 capsule 3  . Neratinib Maleate (NERLYNX) 40 MG tablet Take 6 tablets (240 mg total) by mouth daily. Take with food. (Patient not taking: Reported on 01/03/2017) 180 tablet 0   No current facility-administered medications for this visit.     Review of Systems:  GENERAL:  Feels "better".  No fevers or sweats.  Weight down 3 pounds. PERFORMANCE STATUS (ECOG):  0 HEENT:  No visual changes, runny nose, sore throat, mouth sores or tenderness. Lungs: No shortness of breath or cough.  No pleuritic chest pain.  No hemoptysis. Cardiac:  No chest pain, palpitations, orthopnea, or PND. GI:  Bowel issues on neratinib.   Symptoms resolving after discontinuation.  Baseline constipation. No vomiting, diarrhea, melena or hematochezia. GU:  No urgency, frequency, dysuria or hematuria.  Musculoskeletal:  No back pain.  No joint pain.  No muscle tenderness. Extremities:  No pain or swelling. Skin:  No rashes or skin changes. Neuro:  No numbness, weakness, balance or coordination issues. Endocrine:  Mild hot flashes.  No diabetes, thyroid issues, or night sweats. Psych:   Poor sleep.  No mood changes. Pain:  No focal pain. Review of systems:  All other systems reviewed and found to be negative.  Physical Exam: Blood pressure 131/83, pulse 89, temperature 98.7 F (37.1 C), temperature source Tympanic, resp. rate 18, weight 104 lb 5 oz (47.3 kg). O2 sats 94% with ambulation. GENERAL:  Thin tan, slightly fatigued appearing, woman sitting comfortably in the exam room in no acute distress.  MENTAL STATUS:  Alert and oriented to person, place and time. HEAD:  Short styled hair.  Normocephalic, atraumatic, face symmetric (thin), no Cushingoid features. EYES:  Blue eyes.  Pupils equal round and reactive to light and accomodation.  No conjunctivitis or scleral icterus. ENT:  Oropharynx clear without lesion.  Tongue normal. Mucous membranes moist.  RESPIRATORY:  Clear to auscultation without rales, wheezes or rhonchi.  CARDIOVASCULAR:  Regular rate and rhythm without murmur, rub or gallop. No JVD. ABDOMEN:  Soft, non-tender, with active bowel sounds, and no hepatosplenomegaly.  No masses. SKIN:  Tan.  No rashes, ulcers or lesions. EXTREMITIES: No edema, no skin discoloration or tenderness.  No palpable cords. LYMPH NODES: No palpable cervical, supraclavicular, axillary or inguinal adenopathy  NEUROLOGICAL: Unremarkable. PSYCH:  Appropriate.    Appointment on 01/03/2017  Component Date Value Ref Range Status  . Sodium 01/03/2017 138  135 - 145 mmol/L Final  . Potassium 01/03/2017 3.1* 3.5 - 5.1 mmol/L Final  . Chloride 01/03/2017 104  101 - 111 mmol/L Final  . CO2 01/03/2017 28  22 - 32 mmol/L Final  . Glucose, Bld 01/03/2017 87  65 - 99 mg/dL Final  . BUN 01/03/2017 10  6 - 20 mg/dL Final  . Creatinine, Ser 01/03/2017 0.82  0.44 - 1.00 mg/dL Final  . Calcium 01/03/2017 9.1  8.9 - 10.3 mg/dL Final  . Total Protein 01/03/2017 6.4* 6.5 - 8.1 g/dL Final  . Albumin 01/03/2017 3.7  3.5 - 5.0 g/dL Final  . AST 01/03/2017 29  15 - 41 U/L Final  . ALT 01/03/2017 29  14  - 54 U/L Final  . Alkaline Phosphatase 01/03/2017 45  38 - 126 U/L Final  . Total Bilirubin 01/03/2017 0.5  0.3 - 1.2 mg/dL Final  . GFR calc non Af Amer 01/03/2017 >60  >60 mL/min Final  . GFR calc Af Amer 01/03/2017 >60  >60 mL/min Final   Comment: (NOTE) The eGFR has been calculated using the CKD EPI equation. This calculation has not been validated in all clinical situations. eGFR's persistently <60 mL/min signify possible Chronic Kidney Disease.   . Anion gap 01/03/2017 6  5 - 15 Final    Assessment:  Emma Atkinson is a 51 y.o. female with clinical stage T2N1 (stage IIB) Her2/neu + right breast cancer s/p neoadjuvant chemotherapy and bilateral mastectomy.  She completed a year of adjuvant Herceptin.  She presented with a minimally painful right breast mass with nipple inversion.  Bilateral mammogram and ultrasound on 08/12/2015 revealed a 4.1 cm right superior breast mass containing pleomorphic microcalcifications. There were several satellite lesions (1.3 cm, 0.9 cm, 0.6 cm) plus a 1.3 cm mass versus complicated cyst and a 0.8 cm cyst.  There was one 5 mm right axillary node with irregular thickened cortex.   Core needle biopsy on 08/17/2015 of the right breast mass revealed a grade 3 invasive mammary carcinoma. Right axillary biopsy confirmed metastatic mammary carcinoma. Tumor was ER positive (1-10%), PR positive (11-50%) and HER-2/neu 3+  PET scan on 08/26/2015 revealed hypermetabolic right breast mass (SUV 11.3) with a hypermetabolic right axillary lymph node and a subtle focus of very faint hypermetabolic activity laterally in the right breast. There was faint hypermetabolic activity at the biopsy site of the left breast (? biopsy related).    CA27.29 has been followed: 19.6 on 08/22/2015, 24.7 on 01/10/2016, 15.5 on 03/23/2016, and 16.3 on 12/06/2016.  She received 4 cycles of AC (09/13/2015 - 11/08/2015) with Neulasta support.  She received 4 cycles of Herceptin and Perjeta  (11/29/2015 - 01/31/2016) and 12 weeks of Taxol (11/29/2015 - 02/14/2016).  She tolerated her chemotherapy well.  She developed a grade I neuropathy.  She completed a year of adjuvant Herceptin on 11/15/2016.  She began tamoxifen on 07/05/2016.  She is on Effexor for vasomotor symptoms.  Bilateral mastectomy on 03/08/2016 revealed no residual disease in the right  breast.  Five lymph nodes were negative for malignancy.  Pathologic stage was ypT0 ypN0.  Left breast revealed benign intraductal papillomas and cysts adjacent to the biopsy site.  There was no atypia or malignancy.  She is considering reconstruction in 09/2016 or 10/2016.  She received 50.4 Gy right chest wall radiation from 05/07/2016 - 06/25/2016.  She is planning reconstruction at the end of 09/2016 or beginning of 10/2016.  She is s/p partial hysterectomy (ovaries remain) in 2001.  She was premenopausal ([place] 5 and estradiol 271.9) on 08/22/2015.  Echo on 08/26/2015 revealed an ejection fraction of 55-60%.  Echo on 11/25/2015 revealed an EF of 60-65%.  Echo on 02/23/2016 and 05/25/2016 revealed an EF of 55-65%.  Echo on 08/31/2016 revealed an EF of 50-55%.  Echo on 11/29/2016 revealed an EF of 60-65%.  She underwent reconstructive surgery on 10/10/2016.  Last surgery is scheduled for 01/11/2017.  She has a family history of breast and ovarian cancer.  My Risk genetic testing revealed no mutation on 08/22/2015.  She was on neratinib from 12/06/2016 - 01/01/2017.  She had significant diarrhea affecting quality of life.  She has ssues with constipation (baseline on Miralax).  Symptomatically, she is feeling better off neratinib.  Plan: 1.  Discuss patient's decision to discontinue neratinib.  Patient was vigilant for a month, but despite multiple maneuvers, she was not successful.  Support provided patient.  Discuss continuation of tamoxifen.  Discuss plan for ongoing follow-up. 2.  RTC on 01/09/2017 for BMP. 3.  RTC on 03/05/2017 for MD  assessment and labs (CBC with diff, CMP, CA27.29).   Lequita Asal, MD  01/03/2017, 4:02 PM

## 2017-01-09 ENCOUNTER — Inpatient Hospital Stay: Payer: 59

## 2017-01-09 DIAGNOSIS — Z170 Estrogen receptor positive status [ER+]: Secondary | ICD-10-CM | POA: Diagnosis not present

## 2017-01-09 DIAGNOSIS — R197 Diarrhea, unspecified: Secondary | ICD-10-CM | POA: Diagnosis not present

## 2017-01-09 DIAGNOSIS — E876 Hypokalemia: Secondary | ICD-10-CM | POA: Diagnosis not present

## 2017-01-09 DIAGNOSIS — C50811 Malignant neoplasm of overlapping sites of right female breast: Secondary | ICD-10-CM | POA: Diagnosis not present

## 2017-01-09 DIAGNOSIS — R630 Anorexia: Secondary | ICD-10-CM | POA: Diagnosis not present

## 2017-01-09 DIAGNOSIS — R110 Nausea: Secondary | ICD-10-CM | POA: Diagnosis not present

## 2017-01-09 DIAGNOSIS — G629 Polyneuropathy, unspecified: Secondary | ICD-10-CM | POA: Diagnosis not present

## 2017-01-09 DIAGNOSIS — Z79810 Long term (current) use of selective estrogen receptor modulators (SERMs): Secondary | ICD-10-CM | POA: Diagnosis not present

## 2017-01-09 DIAGNOSIS — K5900 Constipation, unspecified: Secondary | ICD-10-CM | POA: Diagnosis not present

## 2017-01-09 LAB — BASIC METABOLIC PANEL
Anion gap: 8 (ref 5–15)
BUN: 10 mg/dL (ref 6–20)
CO2: 29 mmol/L (ref 22–32)
Calcium: 9.2 mg/dL (ref 8.9–10.3)
Chloride: 103 mmol/L (ref 101–111)
Creatinine, Ser: 0.77 mg/dL (ref 0.44–1.00)
GFR calc Af Amer: >60 mL/min (ref >60)
GFR calc non Af Amer: >60 mL/min (ref >60)
Glucose, Bld: 78 mg/dL (ref 65–99)
Potassium: 4.1 mmol/L (ref 3.5–5.1)
Sodium: 140 mmol/L (ref 135–145)

## 2017-01-10 ENCOUNTER — Telehealth: Payer: Self-pay | Admitting: *Deleted

## 2017-01-10 NOTE — Telephone Encounter (Signed)
Called patient to inform her that here chemistries look good.

## 2017-01-10 NOTE — Telephone Encounter (Signed)
-----   Message from Lequita Asal, MD sent at 01/09/2017  5:35 PM EDT ----- Regarding: Please call patient  Chemistries look good.   ----- Message ----- From: Interface, Lab In [place] Sent: 01/09/2017   4:06 PM To: Lequita Asal, MD

## 2017-01-11 DIAGNOSIS — Z923 Personal history of irradiation: Secondary | ICD-10-CM | POA: Diagnosis not present

## 2017-01-11 DIAGNOSIS — Z87891 Personal history of nicotine dependence: Secondary | ICD-10-CM | POA: Diagnosis not present

## 2017-01-11 DIAGNOSIS — C50919 Malignant neoplasm of unspecified site of unspecified female breast: Secondary | ICD-10-CM | POA: Diagnosis not present

## 2017-01-11 DIAGNOSIS — C50911 Malignant neoplasm of unspecified site of right female breast: Secondary | ICD-10-CM | POA: Diagnosis not present

## 2017-01-11 DIAGNOSIS — Z853 Personal history of malignant neoplasm of breast: Secondary | ICD-10-CM | POA: Diagnosis not present

## 2017-01-11 DIAGNOSIS — Z9221 Personal history of antineoplastic chemotherapy: Secondary | ICD-10-CM | POA: Diagnosis not present

## 2017-01-11 DIAGNOSIS — Z9013 Acquired absence of bilateral breasts and nipples: Secondary | ICD-10-CM | POA: Diagnosis not present

## 2017-01-11 DIAGNOSIS — N650 Deformity of reconstructed breast: Secondary | ICD-10-CM | POA: Diagnosis not present

## 2017-02-07 ENCOUNTER — Inpatient Hospital Stay: Payer: 59

## 2017-02-11 ENCOUNTER — Inpatient Hospital Stay: Payer: 59 | Attending: Hematology and Oncology

## 2017-02-11 DIAGNOSIS — Z79810 Long term (current) use of selective estrogen receptor modulators (SERMs): Secondary | ICD-10-CM | POA: Insufficient documentation

## 2017-02-11 DIAGNOSIS — Z170 Estrogen receptor positive status [ER+]: Secondary | ICD-10-CM | POA: Insufficient documentation

## 2017-02-11 DIAGNOSIS — C50811 Malignant neoplasm of overlapping sites of right female breast: Secondary | ICD-10-CM | POA: Diagnosis not present

## 2017-02-11 DIAGNOSIS — Z452 Encounter for adjustment and management of vascular access device: Secondary | ICD-10-CM | POA: Insufficient documentation

## 2017-02-11 DIAGNOSIS — Z95828 Presence of other vascular implants and grafts: Secondary | ICD-10-CM

## 2017-02-11 MED ORDER — SODIUM CHLORIDE 0.9% FLUSH
10.0000 mL | INTRAVENOUS | Status: DC | PRN
  Administered 2017-02-11: 10 mL via INTRAVENOUS
  Filled 2017-02-11: qty 10

## 2017-02-11 MED ORDER — HEPARIN SOD (PORK) LOCK FLUSH 100 UNIT/ML IV SOLN
500.0000 [IU] | Freq: Once | INTRAVENOUS | Status: AC
  Administered 2017-02-11: 500 [IU] via INTRAVENOUS

## 2017-02-20 ENCOUNTER — Ambulatory Visit: Payer: Self-pay | Admitting: Family

## 2017-02-20 VITALS — BP 122/80 | HR 97 | Temp 98.5°F

## 2017-02-20 DIAGNOSIS — L03116 Cellulitis of left lower limb: Secondary | ICD-10-CM

## 2017-02-20 MED ORDER — SULFAMETHOXAZOLE-TRIMETHOPRIM 400-80 MG PO TABS: 1.0000 | ORAL_TABLET | Freq: Two times a day (BID) | ORAL | 0 refills | Status: DC

## 2017-02-20 NOTE — Progress Notes (Signed)
S/ pain, infected blister to left lower leg, getting worse x 3 days, redness and swelling  O/ VSS Alert , NAD L posterior /lateral ankle with blister , leaking serous fluid but looks pus filled, surrounding redness and swelling  A.cellulitis P/ rx bactrim x 10 days. f/u in 48 H if not improving . May need OP IV fluids. If sxs worsening rapidly ,signs of toxicity reviewed and urged to seek urgent care.

## 2017-02-21 ENCOUNTER — Ambulatory Visit
Admission: RE | Admit: 2017-02-21 | Discharge: 2017-02-21 | Disposition: A | Discharge status: Home / Self Care | Payer: 59 | Source: Ambulatory Visit | Attending: Physician Assistant | Admitting: Physician Assistant

## 2017-02-21 ENCOUNTER — Encounter: Payer: Self-pay | Admitting: Family

## 2017-02-21 ENCOUNTER — Telehealth: Payer: Self-pay | Admitting: Family

## 2017-02-21 DIAGNOSIS — Z87891 Personal history of nicotine dependence: Secondary | ICD-10-CM | POA: Diagnosis not present

## 2017-02-21 DIAGNOSIS — Z90710 Acquired absence of both cervix and uterus: Secondary | ICD-10-CM | POA: Diagnosis not present

## 2017-02-21 DIAGNOSIS — F329 Major depressive disorder, single episode, unspecified: Secondary | ICD-10-CM | POA: Diagnosis not present

## 2017-02-21 DIAGNOSIS — L03116 Cellulitis of left lower limb: Secondary | ICD-10-CM

## 2017-02-21 DIAGNOSIS — Z853 Personal history of malignant neoplasm of breast: Secondary | ICD-10-CM | POA: Diagnosis not present

## 2017-02-21 DIAGNOSIS — K219 Gastro-esophageal reflux disease without esophagitis: Secondary | ICD-10-CM | POA: Diagnosis not present

## 2017-02-21 DIAGNOSIS — F419 Anxiety disorder, unspecified: Secondary | ICD-10-CM | POA: Diagnosis not present

## 2017-02-21 DIAGNOSIS — M79672 Pain in left foot: Secondary | ICD-10-CM | POA: Diagnosis not present

## 2017-02-21 DIAGNOSIS — N182 Chronic kidney disease, stage 2 (mild): Secondary | ICD-10-CM | POA: Diagnosis not present

## 2017-02-21 DIAGNOSIS — Z803 Family history of malignant neoplasm of breast: Secondary | ICD-10-CM | POA: Diagnosis not present

## 2017-02-21 MED ORDER — CLINDAMYCIN PHOSPHATE 600 MG/50ML IV SOLN
600.0000 mg | Freq: Once | INTRAVENOUS | Status: DC
  Administered 2017-02-21: 600 mg via INTRAVENOUS

## 2017-02-21 MED ORDER — SODIUM CHLORIDE 0.9 % IV SOLN
INTRAVENOUS | Status: DC
  Administered 2017-02-21: 14:00:00 via INTRAVENOUS

## 2017-02-21 MED ORDER — CLINDAMYCIN PHOSPHATE 600 MG/50ML IV SOLN
INTRAVENOUS | Status: AC
  Administered 2017-02-21: 600 mg via INTRAVENOUS
  Filled 2017-02-21: qty 50

## 2017-02-21 NOTE — Progress Notes (Signed)
Pt called and swelling and redness around ankle are worse, took 3 doses of the septra with no improvement, denies fever/chills but has been taking advil for pain,   Will send pt to [facility] for opt clindamycin 600mg  iv today, if no improvement in the morning pt will need to go to ER for possible admit to hospital, pt is on tamoxifen

## 2017-02-21 NOTE — Telephone Encounter (Signed)
S/ pain to ankle is worse , swelling also worse around to other side of ankle, so painful could not walk on it and is not at work, taking advil , no fever  P referred to OP for IV clindamycin per Ashok Cordia , PA. She is to call tomorrow am for follow up . If not improving  Refer to ED for further eval.Tommie Homero Fellers FNP

## 2017-02-22 ENCOUNTER — Ambulatory Visit: Payer: Self-pay | Admitting: Family

## 2017-02-22 ENCOUNTER — Emergency Department: Payer: 59

## 2017-02-22 ENCOUNTER — Encounter: Payer: Self-pay | Admitting: Emergency Medicine

## 2017-02-22 ENCOUNTER — Inpatient Hospital Stay
Admission: EM | Admit: 2017-02-22 | Discharge: 2017-02-23 | DRG: 603 | Disposition: A | Discharge status: Home / Self Care | Payer: 59 | Attending: Internal Medicine | Admitting: Internal Medicine

## 2017-02-22 VITALS — BP 134/90 | HR 115 | Temp 98.2°F

## 2017-02-22 DIAGNOSIS — N182 Chronic kidney disease, stage 2 (mild): Secondary | ICD-10-CM | POA: Diagnosis present

## 2017-02-22 DIAGNOSIS — Z79899 Other long term (current) drug therapy: Secondary | ICD-10-CM

## 2017-02-22 DIAGNOSIS — Z79810 Long term (current) use of selective estrogen receptor modulators (SERMs): Secondary | ICD-10-CM | POA: Diagnosis not present

## 2017-02-22 DIAGNOSIS — Z803 Family history of malignant neoplasm of breast: Secondary | ICD-10-CM

## 2017-02-22 DIAGNOSIS — Z853 Personal history of malignant neoplasm of breast: Secondary | ICD-10-CM | POA: Diagnosis not present

## 2017-02-22 DIAGNOSIS — K219 Gastro-esophageal reflux disease without esophagitis: Secondary | ICD-10-CM | POA: Diagnosis present

## 2017-02-22 DIAGNOSIS — R509 Fever, unspecified: Secondary | ICD-10-CM | POA: Diagnosis not present

## 2017-02-22 DIAGNOSIS — L03116 Cellulitis of left lower limb: Secondary | ICD-10-CM | POA: Diagnosis not present

## 2017-02-22 DIAGNOSIS — F419 Anxiety disorder, unspecified: Secondary | ICD-10-CM | POA: Diagnosis present

## 2017-02-22 DIAGNOSIS — L0390 Cellulitis, unspecified: Secondary | ICD-10-CM | POA: Diagnosis not present

## 2017-02-22 DIAGNOSIS — Z90710 Acquired absence of both cervix and uterus: Secondary | ICD-10-CM

## 2017-02-22 DIAGNOSIS — C50919 Malignant neoplasm of unspecified site of unspecified female breast: Secondary | ICD-10-CM | POA: Diagnosis not present

## 2017-02-22 DIAGNOSIS — F329 Major depressive disorder, single episode, unspecified: Secondary | ICD-10-CM | POA: Diagnosis present

## 2017-02-22 DIAGNOSIS — Z87891 Personal history of nicotine dependence: Secondary | ICD-10-CM | POA: Diagnosis not present

## 2017-02-22 DIAGNOSIS — Z881 Allergy status to other antibiotic agents status: Secondary | ICD-10-CM

## 2017-02-22 DIAGNOSIS — M79672 Pain in left foot: Secondary | ICD-10-CM | POA: Diagnosis present

## 2017-02-22 DIAGNOSIS — M7989 Other specified soft tissue disorders: Secondary | ICD-10-CM | POA: Diagnosis not present

## 2017-02-22 LAB — CBC WITH DIFFERENTIAL/PLATELET
BASOS ABS: 0 10*3/uL (ref 0–0.1)
Basophils Relative: 0 %
Eosinophils Absolute: 0.1 10*3/uL (ref 0–0.7)
Eosinophils Relative: 1 %
HEMATOCRIT: 37.2 % (ref 35.0–47.0)
HEMOGLOBIN: 12.3 g/dL (ref 12.0–16.0)
LYMPHS PCT: 9 %
Lymphs Abs: 1 10*3/uL (ref 1.0–3.6)
MCH: 30.6 pg (ref 26.0–34.0)
MCHC: 33.1 g/dL (ref 32.0–36.0)
MCV: 92.5 fL (ref 80.0–100.0)
MONO ABS: 0.7 10*3/uL (ref 0.2–0.9)
Monocytes Relative: 6 %
NEUTROS PCT: 84 %
Neutro Abs: 9.3 10*3/uL — ABNORMAL HIGH (ref 1.4–6.5)
Platelets: 294 10*3/uL (ref 150–440)
RBC: 4.02 MIL/uL (ref 3.80–5.20)
RDW: 14.1 % (ref 11.5–14.5)
WBC: 11 10*3/uL (ref 3.6–11.0)

## 2017-02-22 LAB — COMPREHENSIVE METABOLIC PANEL
ALBUMIN: 3.8 g/dL (ref 3.5–5.0)
ALT: 20 U/L (ref 14–54)
AST: 19 U/L (ref 15–41)
Alkaline Phosphatase: 50 U/L (ref 38–126)
Anion gap: 9 (ref 5–15)
BILIRUBIN TOTAL: 0.6 mg/dL (ref 0.3–1.2)
BUN: 14 mg/dL (ref 6–20)
CALCIUM: 9.6 mg/dL (ref 8.9–10.3)
CO2: 26 mmol/L (ref 22–32)
CREATININE: 0.8 mg/dL (ref 0.44–1.00)
Chloride: 102 mmol/L (ref 101–111)
GFR calc Af Amer: >60 mL/min (ref >60)
GLUCOSE: 99 mg/dL (ref 65–99)
POTASSIUM: 3.8 mmol/L (ref 3.5–5.1)
Sodium: 137 mmol/L (ref 135–145)
TOTAL PROTEIN: 7.2 g/dL (ref 6.5–8.1)

## 2017-02-22 LAB — LACTIC ACID, PLASMA: LACTIC ACID, VENOUS: 0.9 mmol/L (ref 0.5–1.9)

## 2017-02-22 LAB — MRSA PCR SCREENING: MRSA BY PCR: NEGATIVE

## 2017-02-22 MED ORDER — ACETAMINOPHEN 500 MG PO TABS
1000.0000 mg | ORAL_TABLET | Freq: Four times a day (QID) | ORAL | Status: DC | PRN
  Administered 2017-02-22 – 2017-02-23 (×2): 1000 mg via ORAL
  Filled 2017-02-22 (×2): qty 2

## 2017-02-22 MED ORDER — ENOXAPARIN SODIUM 40 MG/0.4ML ~~LOC~~ SOLN
40.0000 mg | SUBCUTANEOUS | Status: DC
  Administered 2017-02-22: 40 mg via SUBCUTANEOUS
  Filled 2017-02-22: qty 0.4

## 2017-02-22 MED ORDER — ALPRAZOLAM 0.25 MG PO TABS: 0.1250 mg | ORAL_TABLET | Freq: Three times a day (TID) | ORAL | Status: DC | PRN

## 2017-02-22 MED ORDER — LEVOFLOXACIN IN D5W 750 MG/150ML IV SOLN
750.0000 mg | INTRAVENOUS | Status: DC
  Administered 2017-02-22: 750 mg via INTRAVENOUS
  Filled 2017-02-22 (×2): qty 150

## 2017-02-22 MED ORDER — DOCUSATE SODIUM 100 MG PO CAPS
100.0000 mg | ORAL_CAPSULE | Freq: Two times a day (BID) | ORAL | Status: DC | PRN
  Administered 2017-02-23: 07:00:00 100 mg via ORAL
  Filled 2017-02-22: qty 1

## 2017-02-22 MED ORDER — VANCOMYCIN HCL IN DEXTROSE 750-5 MG/150ML-% IV SOLN
750.0000 mg | INTRAVENOUS | Status: DC
Start: 2017-02-23 — End: 2017-02-23
  Administered 2017-02-23: 06:00:00 750 mg via INTRAVENOUS
  Filled 2017-02-22 (×2): qty 150

## 2017-02-22 MED ORDER — VANCOMYCIN HCL IN DEXTROSE 1-5 GM/200ML-% IV SOLN
1000.0000 mg | Freq: Once | INTRAVENOUS | Status: AC
  Administered 2017-02-22: 1000 mg via INTRAVENOUS
  Filled 2017-02-22: qty 200

## 2017-02-22 MED ORDER — TAMOXIFEN CITRATE 10 MG PO TABS
20.0000 mg | ORAL_TABLET | Freq: Every day | ORAL | Status: DC
  Administered 2017-02-23: 20 mg via ORAL
  Filled 2017-02-22: qty 2

## 2017-02-22 MED ORDER — ONDANSETRON HCL 4 MG PO TABS: 4.0000 mg | ORAL_TABLET | Freq: Three times a day (TID) | ORAL | Status: DC | PRN

## 2017-02-22 MED ORDER — DIPHENHYDRAMINE HCL 25 MG PO CAPS
25.0000 mg | ORAL_CAPSULE | Freq: Every day | ORAL | Status: DC
  Administered 2017-02-22: 25 mg via ORAL
  Filled 2017-02-22: qty 1

## 2017-02-22 MED ORDER — VENLAFAXINE HCL ER 37.5 MG PO CP24
37.5000 mg | ORAL_CAPSULE | Freq: Every day | ORAL | Status: DC
  Administered 2017-02-23: 37.5 mg via ORAL
  Filled 2017-02-22: qty 1

## 2017-02-22 MED ORDER — DIPHENHYDRAMINE HCL (SLEEP) 25 MG PO TABS: 25.0000 mg | ORAL_TABLET | Freq: Every day | ORAL | Status: DC

## 2017-02-22 NOTE — ED Notes (Signed)
Pt requests something for headache; MD notified.

## 2017-02-22 NOTE — ED Triage Notes (Signed)
Pt c/o pain to left ankle. Started with blister from shoes 2 weeks ago and per pt has turned into cellulitis.  Has had oral and dose IV abx yesterday as outpatient.  Left ankle red/purple and swollen. Open wound to left ankle.  Fever last night up to 101.  NAD at this time.

## 2017-02-22 NOTE — H&P (Signed)
[facility] NAME: Lind Ausley    MR#:  [id number]  DATE OF BIRTH:  1966/05/06  DATE OF ADMISSION:  02/22/2017  PRIMARY CARE PHYSICIAN: Idelle Crouch, MD   REQUESTING/REFERRING PHYSICIAN: Paduchowski  CHIEF COMPLAINT:   Chief Complaint  Patient presents with  . Cellulitis    HISTORY OF PRESENT ILLNESS: Tine Mabee  is a 51 y.o. female with a known history of Anemia, anxiety, breast cancer, chronic kidney disease, depression- after wearing new shoes 2 weeks ago had a small blister on her left ankle which started getting red and some discharge from the blister since last 4-5 days. Concerned with this she went to her primary care doctor with her swollen and red ankle and he started on oral Bactrim which today's third day. She will also called to get injection clindamycin yesterday. In spite of taking this she did not get better and her redness and swelling is getting worse so she was sent to emergency room by her primary care physician. ( She works in [employer] and she was seen by employee health clinic physician assistant.)  X-ray in [place] does not show any spread to the bones and ER physician gave vancomycin and given to hospitalist team for further management as she failed outpatient treatment.  PAST MEDICAL HISTORY:   Past Medical History:  Diagnosis Date  . Anemia    H/O  . Anxiety   . Breast cancer ([place])    breast  . Chronic kidney disease 12-2015   PYLONEPHRITIS  . Complication of anesthesia    HARD TO WAKE UP AFTER TONSILLECTOMY AS A CHILD BUT NO PROBLEMS SINCE  . Complication of anesthesia    PT STATES THAT BACK IN OR DURING PORT PLACMENT IN 2016 THAT SHE BECAME VEERY FLUSHED, LIGHT HEADED AND CRNA SAID ANTIBIOTIC MAY BE GOING IN IN TO FAST-RATE DECREASED AND PTS SYMPTOMS RESOLVED IMMEDIATELY  . Depression   . GERD (gastroesophageal reflux disease)   . Shortness of breath dyspnea     PAST SURGICAL HISTORY: Past Surgical  History:  Procedure Laterality Date  . ABDOMINAL HYSTERECTOMY  2001  . BACK SURGERY     FOR SCOLIOSIS-HERRINGTON RODS IN PLACE  . BREAST BIOPSY Bilateral    08/17/2015   . PORTACATH PLACEMENT N/A 08/30/2015   Procedure: INSERTION PORT-A-CATH;  Surgeon: Leonie Green, MD;  Location: [facility] ORS;  Service: General;  Laterality: N/A;  . SENTINEL NODE BIOPSY Bilateral 03/08/2016   Procedure: SENTINEL NODE BIOPSY;  Surgeon: Leonie Green, MD;  Location: [facility] ORS;  Service: General;  Laterality: Bilateral;  . SIMPLE MASTECTOMY WITH AXILLARY SENTINEL NODE BIOPSY Bilateral 03/08/2016   Procedure: SIMPLE MASTECTOMY;  Surgeon: Leonie Green, MD;  Location: [facility] ORS;  Service: General;  Laterality: Bilateral;  . TONSILLECTOMY      SOCIAL HISTORY:  Social History  Substance Use Topics  . Smoking status: Former Smoker    Packs/day: 1.00    Years: 10.00    Types: Cigarettes    Quit date: 10/16/1999  . Smokeless tobacco: Never Used  . Alcohol use Yes     Comment: RARE WINE    FAMILY HISTORY:  Family History  Problem Relation Age of Onset  . Breast cancer Maternal Aunt 64  . Cancer Father   . Cancer Sister   . Cancer Maternal Grandfather     DRUG ALLERGIES:  Allergies  Allergen Reactions  . Ceftin [Cefuroxime Axetil] Rash    REVIEW  OF SYSTEMS:   CONSTITUTIONAL: No fever, fatigue or weakness.  EYES: No blurred or double vision.  EARS, NOSE, AND THROAT: No tinnitus or ear pain.  RESPIRATORY: No cough, shortness of breath, wheezing or hemoptysis.  CARDIOVASCULAR: No chest pain, orthopnea, edema.  GASTROINTESTINAL: No nausea, vomiting, diarrhea or abdominal pain.  GENITOURINARY: No dysuria, hematuria.  ENDOCRINE: No polyuria, nocturia,  HEMATOLOGY: No anemia, easy bruising or bleeding SKIN: No rash or lesion. MUSCULOSKELETAL: Left ankle swelling and redness.   NEUROLOGIC: No tingling, numbness, weakness.  PSYCHIATRY: No anxiety or depression.   MEDICATIONS AT HOME:   Prior to Admission medications   Medication Sig Start Date End Date Taking? Authorizing Provider  acetaminophen (TYLENOL) 500 MG tablet Take 1,000 mg by mouth every 6 (six) hours as needed for mild pain.    Yes [provider]  ALPRAZolam (XANAX) 0.25 MG tablet Take 0.0625 mg by mouth 3 (three) times daily as needed for anxiety.   Yes [provider]  diphenhydrAMINE (SOMINEX) 25 MG tablet Take 25 mg by mouth at bedtime. Reported on 01/12/2016   Yes [provider]  lidocaine-prilocaine (EMLA) cream Apply 1 application topically as needed. Apply at least 1-2 hours before the port is to be used. 09/05/15  Yes Lequita Asal, MD  magic mouthwash SOLN Take 5 mLs by mouth 3 (three) times daily as needed for mouth pain. 12/06/16  Yes Fisher, Linden Dolin, PA-C  Multiple Vitamin (MULTIVITAMIN) capsule Take 1 capsule by mouth daily.   Yes [provider]  ondansetron (ZOFRAN) 4 MG tablet Take 1 tablet (4 mg total) by mouth every 8 (eight) hours as needed for nausea or vomiting. 12/11/16  Yes Sindy Guadeloupe, MD  sulfamethoxazole-trimethoprim (BACTRIM) 400-80 MG tablet Take 1 tablet by mouth 2 (two) times daily. 02/20/17  Yes Blima Singer, NP  tamoxifen (NOLVADEX) 20 MG tablet Take 1 tablet (20 mg total) by mouth daily. 12/06/16  Yes Lequita Asal, MD  venlafaxine XR (EFFEXOR-XR) 37.5 MG 24 hr capsule Take 1 capsule (37.5 mg total) by mouth daily with breakfast. 11/21/16  Yes Corcoran, Melissa C, MD      PHYSICAL EXAMINATION:   VITAL SIGNS: Blood pressure (!) 114/48, pulse 90, temperature 98.7 F (37.1 C), temperature source Oral, resp. rate 16, height 5\' 1"  (1.549 m), weight 47.6 kg (105 lb), SpO2 99 %.  GENERAL:  51 y.o.-year-old patient lying in the bed with no acute distress.  EYES: Pupils equal, round, reactive to light and accommodation. No scleral icterus. Extraocular muscles intact.  HEENT: Head atraumatic, normocephalic. Oropharynx and nasopharynx  clear.  NECK:  Supple, no jugular venous distention. No thyroid enlargement, no tenderness.  LUNGS: Normal breath sounds bilaterally, no wheezing, rales,rhonchi or crepitation. No use of accessory muscles of respiration.  CARDIOVASCULAR: S1, S2 normal. No murmurs, rubs, or gallops.  ABDOMEN: Soft, nontender, nondistended. Bowel sounds present. No organomegaly or mass.  EXTREMITIES: Left ankle has a small blister on the posterior side and surrounding redness and swelling, distal pulses are palpable.  NEUROLOGIC: Cranial nerves II through XII are intact. Muscle strength 5/5 in all extremities. Sensation intact. Gait not checked.  PSYCHIATRIC: The patient is alert and oriented x 3.  SKIN: No obvious rash, lesion, or ulcer.   LABORATORY PANEL:   CBC  Recent Labs Lab 02/22/17 1532  WBC 11.0  HGB 12.3  HCT 37.2  PLT 294  MCV 92.5  MCH 30.6  MCHC 33.1  RDW 14.1  LYMPHSABS 1.0  MONOABS 0.7  EOSABS 0.1  BASOSABS 0.0   ------------------------------------------------------------------------------------------------------------------  Chemistries   Recent Labs Lab 02/22/17 1532  NA 137  K 3.8  CL 102  CO2 26  GLUCOSE 99  BUN 14  CREATININE 0.80  CALCIUM 9.6  AST 19  ALT 20  ALKPHOS 50  BILITOT 0.6   ------------------------------------------------------------------------------------------------------------------ estimated creatinine clearance is 62.5 mL/min (by C-G formula based on SCr of 0.8 mg/dL). ------------------------------------------------------------------------------------------------------------------ No results for input(s): TSH, T4TOTAL, T3FREE, THYROIDAB in the last 72 hours.  Invalid input(s): FREET3   Coagulation profile No results for input(s): INR, PROTIME in the last 168 hours. ------------------------------------------------------------------------------------------------------------------- No results for input(s): DDIMER in the last 72  hours. -------------------------------------------------------------------------------------------------------------------  Cardiac Enzymes No results for input(s): CKMB, TROPONINI, MYOGLOBIN in the last 168 hours.  Invalid input(s): CK ------------------------------------------------------------------------------------------------------------------ Invalid input(s): POCBNP  ---------------------------------------------------------------------------------------------------------------  Urinalysis    Component Value Date/Time   COLORURINE STRAW (A) 01/10/2016 0959   APPEARANCEUR CLEAR (A) 01/10/2016 0959   LABSPEC 1.002 (L) 01/10/2016 0959   PHURINE 7.0 01/10/2016 0959   GLUCOSEU NEGATIVE 01/10/2016 0959   HGBUR 1+ (A) 01/10/2016 0959   BILIRUBINUR negative 01/12/2016 [place] 01/10/2016 0959   PROTEINUR negative 01/12/2016 0947   PROTEINUR NEGATIVE 01/10/2016 0959   UROBILINOGEN 0.2 01/12/2016 0947   NITRITE negative 01/12/2016 0947   NITRITE NEGATIVE 01/10/2016 0959   LEUKOCYTESUR moderate (2+) (A) 01/12/2016 0947     RADIOLOGY: Dg Ankle Complete Left  Result Date: 02/22/2017 CLINICAL DATA:  Left ankle swelling, cellulitis and fever. EXAM: LEFT ANKLE COMPLETE - 3+ VIEW COMPARISON:  None. FINDINGS: No fracture or bony destruction identified. Irregularity of the posterior talus may relate to old injury. The ankle mortise shows normal alignment. No foreign body identified. IMPRESSION: Soft tissue swelling without evidence by radiography to suggest fracture or osteomyelitis. Electronically Signed   By: Aletta Edouard M.D.   On: 02/22/2017 16:16    EKG: No orders found for this or any previous visit.  IMPRESSION AND PLAN:  * Cellulitis   Failed outpatient treatment.    IV vancomycin and Levaquin for now, MRSA screen.   Pain management as needed.   If does not improve then we may help to call podiatry to check for any drainable abscess.  * Anxiety and  depression   Continue medications.  * History of breast cancer   Currently stable.  All the records are reviewed and case discussed with ED provider. Management plans discussed with the patient, family and they are in agreement.  CODE STATUS: Full code Code Status History    Date Active Date Inactive Code Status Order ID Comments User Context   03/08/2016  5:16 PM 03/09/2016  5:57 PM Full Code [id number]  Leonie Green, MD Inpatient     Patient's husband and sister were present in the urine during my visit.  TOTAL TIME TAKING CARE OF THIS PATIENT: 50 minutes.    Vaughan Basta M.D on 02/22/2017   Between 7am to 6pm - Pager - [phone]  After 6pm go to [email or web address] - password [email or web address] [facility]  Office  [phone]  CC: Primary care physician; Idelle Crouch, MD   Note: This dictation was prepared with Dragon dictation along with smaller phrase technology. Any transcriptional errors that result from this process are unintentional.

## 2017-02-22 NOTE — Progress Notes (Signed)
f/u after 2 d bactrim and Clindamycin IV x one as OP . Not better , running low grade temp.foot/ankle red / swollen O not toxic appearing afebrile, tachy noted LLE with mod swelling redness and oozing blister  A/ cellulitis not improving  P referred to ED for further eval and tx.  ED notified and Pt transported  with HB in stable  Condition

## 2017-02-22 NOTE — Progress Notes (Signed)
Pharmacy Antibiotic Note  Emma Atkinson is a 51 y.o. female admitted on 02/22/2017 with cellulitis.  Pharmacy has been consulted for levofloxacin and vancomycin dosing.  Plan: 1. Levofloxacin 750 mg IV Q24H 2. Vancomycin 1 gm IV x 1 in ED followed in approximately 12 hours (stacked dosing) by vancomycin 750 mg IV Q18H - predicted trough 13 mcg/mL. Pharmacy will continue to follow and adjust as needed to maintain trough 10 to 15 mcg/mL.   Vd 33.1 L, Ke 0.056 hr-1, T1/2 12.4 hr  Height: 5\' 1"  (154.9 cm) Weight: 105 lb (47.6 kg) IBW/kg (Calculated) : 47.8  Temp (24hrs), Avg:98.5 F (36.9 C), Min:98.2 F (36.8 C), Max:98.7 F (37.1 C)   Recent Labs Lab 02/22/17 1532 02/22/17 1539  WBC 11.0  --   CREATININE 0.80  --   LATICACIDVEN  --  0.9    Estimated Creatinine Clearance: 62.5 mL/min (by C-G formula based on SCr of 0.8 mg/dL).    Allergies  Allergen Reactions  . Ceftin [Cefuroxime Axetil] Rash   Thank you for allowing pharmacy to be a part of this patient's care.  Laural Benes, Pharm.D., BCPS Clinical Pharmacist 02/22/2017 8:01 PM

## 2017-02-22 NOTE — ED Provider Notes (Signed)
[facility] Emergency Department Provider Note  Time seen: 5:54 PM  I have reviewed the triage vital signs and the nursing notes.   HISTORY  Chief Complaint Cellulitis    HPI Emma Atkinson is a 51 y.o. female with a past medical history of anemia, CK D, presents to the emergency department for left foot cellulitis. According to the patient approximately 2 weeks ago she developed a blister to the left ankle. She states approximately 4 days ago she developed worsening redness and tenderness to this area. She saw her doctor 2 days ago and was started on Bactrim for cellulitis. States the redness and pain worsened yesterday so she went back to her doctor who gave her a dose of IV clindamycin. She states she then developed a fever to 101 last night with again worsening redness and swelling so she went back to her doctor today who sent her to the emergency department for failed outpatient therapy of cellulitis. Patient states she has not spiked a fever since last night. States some nausea but denies vomiting. Denies any chest pain or trouble breathing. Otherwise largely negative review of systems.  Past Medical History:  Diagnosis Date  . Anemia    H/O  . Anxiety   . Breast cancer ([place])    breast  . Chronic kidney disease 12-2015   PYLONEPHRITIS  . Complication of anesthesia    HARD TO WAKE UP AFTER TONSILLECTOMY AS A CHILD BUT NO PROBLEMS SINCE  . Complication of anesthesia    PT STATES THAT BACK IN OR DURING PORT PLACMENT IN 2016 THAT SHE BECAME VEERY FLUSHED, LIGHT HEADED AND CRNA SAID ANTIBIOTIC MAY BE GOING IN IN TO FAST-RATE DECREASED AND PTS SYMPTOMS RESOLVED IMMEDIATELY  . Depression   . GERD (gastroesophageal reflux disease)   . Shortness of breath dyspnea     Patient Active Problem List   Diagnosis Date Noted  . Hypokalemia 12/06/2016  . Hot flashes due to tamoxifen 08/18/2016  . Carcinoma of overlapping sites of right breast in female, estrogen  receptor positive ([place]) 07/26/2016  . Loss of weight 12/06/2015  . Peripheral neuropathy due to chemotherapy ([place]) 12/06/2015  . Skin lesion 11/29/2015  . Low serum cortisol level ([name]) 09/19/2015  . Malignant neoplasm of central portion of right female breast ([place]) 09/04/2015  . Anxiety and depression 08/31/2015  . Insomnia, persistent 08/31/2015    Past Surgical History:  Procedure Laterality Date  . ABDOMINAL HYSTERECTOMY  2001  . BACK SURGERY     FOR SCOLIOSIS-HERRINGTON RODS IN PLACE  . BREAST BIOPSY Bilateral    08/17/2015   . PORTACATH PLACEMENT N/A 08/30/2015   Procedure: INSERTION PORT-A-CATH;  Surgeon: Leonie Green, MD;  Location: [facility] ORS;  Service: General;  Laterality: N/A;  . SENTINEL NODE BIOPSY Bilateral 03/08/2016   Procedure: SENTINEL NODE BIOPSY;  Surgeon: Leonie Green, MD;  Location: [facility] ORS;  Service: General;  Laterality: Bilateral;  . SIMPLE MASTECTOMY WITH AXILLARY SENTINEL NODE BIOPSY Bilateral 03/08/2016   Procedure: SIMPLE MASTECTOMY;  Surgeon: Leonie Green, MD;  Location: [facility] ORS;  Service: General;  Laterality: Bilateral;  . TONSILLECTOMY      Prior to Admission medications   Medication Sig Start Date End Date Taking? Authorizing Provider  acetaminophen (TYLENOL) 500 MG tablet Take 1,000 mg by mouth every 6 (six) hours as needed.    [provider]  ALPRAZolam Duanne Moron) 0.25 MG tablet Take 1/4 tablet up to three times daily QHS    [provider]  Dexlansoprazole (DEXILANT) 30 MG capsule Take 30 mg by mouth as needed.     [provider]  diphenhydrAMINE (SOMINEX) 25 MG tablet Take 25 mg by mouth at bedtime. Reported on 01/12/2016    [provider]  HYDROcodone-acetaminophen (NORCO/VICODIN) 5-325 MG tablet Take 1-2 tablets by mouth every 4 (four) hours as needed for moderate pain. 03/09/16   Leonie Green, MD  lidocaine-prilocaine (EMLA) cream Apply 1 application topically as needed. Apply at  least 1-2 hours before the port is to be used. 09/05/15   Lequita Asal, MD  magic mouthwash SOLN Take 5 mLs by mouth 3 (three) times daily as needed for mouth pain. 12/06/16   Versie Starks, PA-C  Multiple Vitamin (MULTIVITAMIN) capsule Take 1 capsule by mouth daily.    [provider]  Neratinib Maleate (NERLYNX) 40 MG tablet Take 6 tablets (240 mg total) by mouth daily. Take with food. Patient not taking: Reported on 01/03/2017 12/10/16   Lequita Asal, MD  ondansetron (ZOFRAN) 4 MG tablet Take 1 tablet (4 mg total) by mouth every 8 (eight) hours as needed for nausea or vomiting. 12/11/16   Sindy Guadeloupe, MD  potassium chloride (K-DUR) 10 MEQ tablet Take 1 tablet (10 mEq total) by mouth 2 (two) times daily. for 2 days 12/20/16   Lequita Asal, MD  sulfamethoxazole-trimethoprim (BACTRIM) 400-80 MG tablet Take 1 tablet by mouth 2 (two) times daily. 02/20/17   Blima Singer, NP  tamoxifen (NOLVADEX) 20 MG tablet Take 1 tablet (20 mg total) by mouth daily. 12/06/16   Lequita Asal, MD  venlafaxine XR (EFFEXOR-XR) 37.5 MG 24 hr capsule Take 1 capsule (37.5 mg total) by mouth daily with breakfast. 11/21/16   Lequita Asal, MD    Allergies  Allergen Reactions  . Ceftin [Cefuroxime Axetil] Rash    Family History  Problem Relation Age of Onset  . Breast cancer Maternal Aunt 64  . Cancer Father   . Cancer Sister   . Cancer Maternal Grandfather     Social History Social History  Substance Use Topics  . Smoking status: Former Smoker    Packs/day: 1.00    Years: 10.00    Types: Cigarettes    Quit date: 10/16/1999  . Smokeless tobacco: Never Used  . Alcohol use Yes     Comment: RARE WINE    Review of Systems Constitutional: Fever to 101 last night Eyes: Negative for visual changes. Cardiovascular: Negative for chest pain. Respiratory: Negative for shortness of breath. Gastrointestinal: Negative for abdominal pain and positive for nausea. Negative for  vomiting or diarrhea. Genitourinary: Negative for dysuria. Musculoskeletal: Tenderness, swelling and erythema of the left ankle Skin: Swelling, redness, tenderness of the left ankle Neurological: Negative for headache All other ROS negative  ____________________________________________   PHYSICAL EXAM:  VITAL SIGNS: ED Triage Vitals [02/22/17 1521]  Enc Vitals Group     BP (!) 145/73     Pulse Rate (!) 101     Resp 16     Temp 98.7 F (37.1 C)     Temp Source Oral     SpO2 100 %     Weight 105 lb (47.6 kg)     Height 5\' 1"  (1.549 m)     Head Circumference      Peak Flow      Pain Score 4     Pain Loc      Pain Edu?  Excl. in [place]?     Constitutional: Alert and oriented. Well appearing and in no distress. Eyes: Normal exam ENT   Head: Normocephalic and atraumatic.   Mouth/Throat: Mucous membranes are moist. Cardiovascular: Normal rate, regular rhythm. No murmur Respiratory: Normal respiratory effort without tachypnea nor retractions. Breath sounds are clear  Gastrointestinal: Soft and nontender. No distention.  Musculoskeletal: Mild tenderness to palpation of the left ankle and proximal foot. No calf tenderness or edema. 2+ DP pulse. Good range of motion in the ankle. Moderate erythema to this area. Neurologic:  Normal speech and language. No gross focal neurologic deficits  Skin:  Skin is warm, dry. Erythema as described above. Small blister to medial aspect of left ankle. Psychiatric: Mood and affect are normal.   ____________________________________________   RADIOLOGY  IMPRESSION: Soft tissue swelling without evidence by radiography to suggest fracture or osteomyelitis.  ____________________________________________   INITIAL IMPRESSION / ASSESSMENT AND PLAN / ED COURSE  Pertinent labs & imaging results that were available during my care of the patient were reviewed by me and considered in my medical decision making (see chart for  details).  Patient presents to the emergency department with failed outpatient therapy of left ankle cellulitis. Patient continues to have worsening redness and swelling per patient despite 48 hours of oral antibiotics as well as a dose of IV antibiotics per patient had a fever to 101 last night. Patient's labs are largely within normal limits in the emergency department including a normal white blood cell count and a negative lactate. However given the patient's worsening redness, swelling and fever despite oral antibiotics we will admit to the hospital for continued IV antibiotics. We will check blood cultures in the emergency department and started on IV vancomycin.  ____________________________________________   FINAL CLINICAL IMPRESSION(S) / ED DIAGNOSES  Cellulitis    Harvest Dark, MD 02/22/17 1758

## 2017-02-23 LAB — CBC
HCT: 34.6 % — ABNORMAL LOW (ref 35.0–47.0)
Hemoglobin: 11.6 g/dL — ABNORMAL LOW (ref 12.0–16.0)
MCH: 31 pg (ref 26.0–34.0)
MCHC: 33.6 g/dL (ref 32.0–36.0)
MCV: 92.3 fL (ref 80.0–100.0)
PLATELETS: 269 10*3/uL (ref 150–440)
RBC: 3.75 MIL/uL — ABNORMAL LOW (ref 3.80–5.20)
RDW: 13.8 % (ref 11.5–14.5)
WBC: 8 10*3/uL (ref 3.6–11.0)

## 2017-02-23 LAB — BASIC METABOLIC PANEL
ANION GAP: 8 (ref 5–15)
BUN: 13 mg/dL (ref 6–20)
CALCIUM: 9.1 mg/dL (ref 8.9–10.3)
CO2: 27 mmol/L (ref 22–32)
CREATININE: 0.8 mg/dL (ref 0.44–1.00)
Chloride: 104 mmol/L (ref 101–111)
Glucose, Bld: 94 mg/dL (ref 65–99)
Potassium: 3.8 mmol/L (ref 3.5–5.1)
SODIUM: 139 mmol/L (ref 135–145)

## 2017-02-23 MED ORDER — CLINDAMYCIN HCL 150 MG PO CAPS: 300.0000 mg | ORAL_CAPSULE | Freq: Three times a day (TID) | ORAL | Status: DC

## 2017-02-23 MED ORDER — CLINDAMYCIN HCL 300 MG PO CAPS: 300.0000 mg | ORAL_CAPSULE | Freq: Three times a day (TID) | ORAL | 0 refills | Status: DC

## 2017-02-23 NOTE — Discharge Summary (Signed)
[place] [facility] [place] NAME: Emma Atkinson    MR#:  [id number]  DATE OF BIRTH:  [birth year]  DATE OF ADMISSION:  02/22/2017   ADMITTING PHYSICIAN: Vaughan Basta, MD  DATE OF DISCHARGE: 02/23/2017 10:23 AM  PRIMARY CARE PHYSICIAN: Idelle Crouch, MD   ADMISSION DIAGNOSIS:  Cellulitis of left lower extremity [Y86.578] DISCHARGE DIAGNOSIS:  Principal Problem:   Cellulitis left foot cellulitis SECONDARY DIAGNOSIS:   Past Medical History:  Diagnosis Date  . Anemia    H/O  . Anxiety   . Breast cancer ([place])    breast  . Chronic kidney disease 12-2015   PYLONEPHRITIS  . Complication of anesthesia    HARD TO WAKE UP AFTER TONSILLECTOMY AS A CHILD BUT NO PROBLEMS SINCE  . Complication of anesthesia    PT STATES THAT BACK IN OR DURING PORT PLACMENT IN 2016 THAT SHE BECAME VEERY FLUSHED, LIGHT HEADED AND CRNA SAID ANTIBIOTIC MAY BE GOING IN IN TO FAST-RATE DECREASED AND PTS SYMPTOMS RESOLVED IMMEDIATELY  . Depression   . GERD (gastroesophageal reflux disease)   . Shortness of breath dyspnea    HOSPITAL COURSE:   * Cellulitis of left foot.   Failed outpatient treatment.  She was on IV vancomycin and Levaquin. MRSA screen is negative.   Pain management as needed. Cellulitis has been improving. We will change to clindamycin by mouth every 8 hours for 7 more days.  * Anxiety and depression   Continue medications.  * History of breast cancer   Currently stable.  DISCHARGE CONDITIONS:  Stable, discharge to home today. CONSULTS OBTAINED:   DRUG ALLERGIES:   Allergies  Allergen Reactions  . Ceftin [Cefuroxime Axetil] Rash   DISCHARGE MEDICATIONS:   Allergies as of 02/23/2017      Reactions   Ceftin [cefuroxime Axetil] Rash      Medication List    STOP taking these medications   sulfamethoxazole-trimethoprim 400-80 MG tablet Commonly known as:  BACTRIM     TAKE these medications   acetaminophen 500 MG  tablet Commonly known as:  TYLENOL Take 1,000 mg by mouth every 6 (six) hours as needed for mild pain.   ALPRAZolam 0.25 MG tablet Commonly known as:  XANAX Take 0.0625 mg by mouth 3 (three) times daily as needed for anxiety.   clindamycin 300 MG capsule Commonly known as:  CLEOCIN Take 1 capsule (300 mg total) by mouth every 8 (eight) hours.   diphenhydrAMINE 25 MG tablet Commonly known as:  SOMINEX Take 25 mg by mouth at bedtime. Reported on 01/12/2016   lidocaine-prilocaine cream Commonly known as:  EMLA Apply 1 application topically as needed. Apply at least 1-2 hours before the port is to be used.   magic mouthwash Soln Take 5 mLs by mouth 3 (three) times daily as needed for mouth pain.   multivitamin capsule Take 1 capsule by mouth daily.   ondansetron 4 MG tablet Commonly known as:  ZOFRAN Take 1 tablet (4 mg total) by mouth every 8 (eight) hours as needed for nausea or vomiting.   tamoxifen 20 MG tablet Commonly known as:  NOLVADEX Take 1 tablet (20 mg total) by mouth daily.   venlafaxine XR 37.5 MG 24 hr capsule Commonly known as:  EFFEXOR-XR Take 1 capsule (37.5 mg total) by mouth daily with breakfast.        DISCHARGE INSTRUCTIONS:  See AVS. If you experience worsening of your admission symptoms, develop shortness of breath, life threatening  emergency, suicidal or homicidal thoughts you must seek medical attention immediately by calling 911 or calling your MD immediately  if symptoms less severe.  You Must read complete instructions/literature along with all the possible adverse reactions/side effects for all the Medicines you take and that have been prescribed to you. Take any new Medicines after you have completely understood and accpet all the possible adverse reactions/side effects.   Please note  You were cared for by a hospitalist during your hospital stay. If you have any questions about your discharge medications or the care you received while you  were in the hospital after you are discharged, you can call the unit and asked to speak with the hospitalist on call if the hospitalist that took care of you is not available. Once you are discharged, your primary care physician will handle any further medical issues. Please note that NO REFILLS for any discharge medications will be authorized once you are discharged, as it is imperative that you return to your primary care physician (or establish a relationship with a primary care physician if you do not have one) for your aftercare needs so that they can reassess your need for medications and monitor your lab values.    On the day of Discharge:  VITAL SIGNS:  Blood pressure (!) 123/40, pulse 88, temperature 98.6 F (37 C), temperature source Oral, resp. rate 18, height 5\' 1"  (1.549 m), weight 105 lb 12.8 oz (48 kg), SpO2 99 %. PHYSICAL EXAMINATION:  GENERAL:  51 y.o.-year-old patient lying in the bed with no acute distress.  EYES: Pupils equal, round, reactive to light and accommodation. No scleral icterus. Extraocular muscles intact.  HEENT: Head atraumatic, normocephalic. Oropharynx and nasopharynx clear.  NECK:  Supple, no jugular venous distention. No thyroid enlargement, no tenderness.  LUNGS: Normal breath sounds bilaterally, no wheezing, rales,rhonchi or crepitation. No use of accessory muscles of respiration.  CARDIOVASCULAR: S1, S2 normal. No murmurs, rubs, or gallops.  ABDOMEN: Soft, non-tender, non-distended. Bowel sounds present. No organomegaly or mass.  EXTREMITIES: No pedal edema, cyanosis, or clubbing. Mild swelling and erythema on the left foot. NEUROLOGIC: Cranial nerves II through XII are intact. Muscle strength 5/5 in all extremities. Sensation intact. Gait not checked.  PSYCHIATRIC: The patient is alert and oriented x 3.  SKIN: No obvious rash, lesion, or ulcer.  DATA REVIEW:   CBC  Recent Labs Lab 02/23/17 0420  WBC 8.0  HGB 11.6*  HCT 34.6*  PLT 269     Chemistries   Recent Labs Lab 02/22/17 1532 02/23/17 0420  NA 137 139  K 3.8 3.8  CL 102 104  CO2 26 27  GLUCOSE 99 94  BUN 14 13  CREATININE 0.80 0.80  CALCIUM 9.6 9.1  AST 19  --   ALT 20  --   ALKPHOS 50  --   BILITOT 0.6  --      Microbiology Results  Results for orders placed or performed during the hospital encounter of 02/22/17  Blood culture (routine x 2)     Status: None (Preliminary result)   Collection Time: 02/22/17  3:39 PM  Result Value Ref Range Status   Specimen Description BLOOD LEFT HAND  Final   Special Requests   Final    BOTTLES DRAWN AEROBIC AND ANAEROBIC Blood Culture results may not be optimal due to an inadequate volume of blood received in culture bottles   Culture NO GROWTH < 24 HOURS  Final   Report Status PENDING  Incomplete  Blood culture (routine x 2)     Status: None (Preliminary result)   Collection Time: 02/22/17  3:39 PM  Result Value Ref Range Status   Specimen Description BLOOD LEFT ASSIST CONTROL  Final   Special Requests   Final    BOTTLES DRAWN AEROBIC AND ANAEROBIC Blood Culture adequate volume   Culture NO GROWTH < 24 HOURS  Final   Report Status PENDING  Incomplete  MRSA PCR Screening     Status: None   Collection Time: 02/22/17 10:15 PM  Result Value Ref Range Status   MRSA by PCR NEGATIVE NEGATIVE Final    Comment:        The GeneXpert MRSA Assay (FDA approved for NASAL specimens only), is one component of a comprehensive MRSA colonization surveillance program. It is not intended to diagnose MRSA infection nor to guide or monitor treatment for MRSA infections.     RADIOLOGY:  Dg Ankle Complete Left  Result Date: 02/22/2017 CLINICAL DATA:  Left ankle swelling, cellulitis and fever. EXAM: LEFT ANKLE COMPLETE - 3+ VIEW COMPARISON:  None. FINDINGS: No fracture or bony destruction identified. Irregularity of the posterior talus may relate to old injury. The ankle mortise shows normal alignment. No foreign body  identified. IMPRESSION: Soft tissue swelling without evidence by radiography to suggest fracture or osteomyelitis. Electronically Signed   By: Aletta Edouard M.D.   On: 02/22/2017 16:16     Management plans discussed with the patient, family and they are in agreement.  CODE STATUS: Full Code   TOTAL TIME TAKING CARE OF THIS PATIENT: 25 minutes.    Demetrios Loll M.D on 02/23/2017 at 12:16 PM  Between 7am to 6pm - Pager - [phone]  After 6pm go to [email or web address] - Proofreader  [facility]  Office  [phone]  CC: Primary care physician; Idelle Crouch, MD   Note: This dictation was prepared with Dragon dictation along with smaller phrase technology. Any transcriptional errors that result from this process are unintentional.

## 2017-02-23 NOTE — Discharge Instructions (Signed)
Heart healthy diet. Keep right foot dry and clean.

## 2017-02-23 NOTE — Progress Notes (Signed)
Pt being discharged home, discharge instructions and prescription reviewed with pt and husband, states understanding, pt with no complaints at discharge

## 2017-02-24 LAB — HIV ANTIBODY (ROUTINE TESTING W REFLEX): HIV SCREEN 4TH GENERATION: NONREACTIVE

## 2017-02-27 LAB — CULTURE, BLOOD (ROUTINE X 2)
CULTURE: NO GROWTH
Culture: NO GROWTH
SPECIAL REQUESTS: ADEQUATE

## 2017-03-05 ENCOUNTER — Encounter: Payer: Self-pay | Admitting: Hematology and Oncology

## 2017-03-05 ENCOUNTER — Inpatient Hospital Stay: Payer: 59 | Attending: Hematology and Oncology

## 2017-03-05 ENCOUNTER — Inpatient Hospital Stay (HOSPITAL_BASED_OUTPATIENT_CLINIC_OR_DEPARTMENT_OTHER): Payer: 59 | Admitting: Hematology and Oncology

## 2017-03-05 VITALS — BP 125/82 | HR 99 | Temp 98.8°F | Resp 18 | Wt 107.3 lb

## 2017-03-05 DIAGNOSIS — F329 Major depressive disorder, single episode, unspecified: Secondary | ICD-10-CM | POA: Insufficient documentation

## 2017-03-05 DIAGNOSIS — K5900 Constipation, unspecified: Secondary | ICD-10-CM | POA: Diagnosis not present

## 2017-03-05 DIAGNOSIS — Z9013 Acquired absence of bilateral breasts and nipples: Secondary | ICD-10-CM

## 2017-03-05 DIAGNOSIS — Z79810 Long term (current) use of selective estrogen receptor modulators (SERMs): Secondary | ICD-10-CM | POA: Insufficient documentation

## 2017-03-05 DIAGNOSIS — K219 Gastro-esophageal reflux disease without esophagitis: Secondary | ICD-10-CM | POA: Diagnosis not present

## 2017-03-05 DIAGNOSIS — G629 Polyneuropathy, unspecified: Secondary | ICD-10-CM | POA: Insufficient documentation

## 2017-03-05 DIAGNOSIS — L03116 Cellulitis of left lower limb: Secondary | ICD-10-CM | POA: Insufficient documentation

## 2017-03-05 DIAGNOSIS — R232 Flushing: Secondary | ICD-10-CM

## 2017-03-05 DIAGNOSIS — C50811 Malignant neoplasm of overlapping sites of right female breast: Secondary | ICD-10-CM | POA: Insufficient documentation

## 2017-03-05 DIAGNOSIS — F419 Anxiety disorder, unspecified: Secondary | ICD-10-CM | POA: Insufficient documentation

## 2017-03-05 DIAGNOSIS — Z87891 Personal history of nicotine dependence: Secondary | ICD-10-CM | POA: Insufficient documentation

## 2017-03-05 DIAGNOSIS — Z9221 Personal history of antineoplastic chemotherapy: Secondary | ICD-10-CM | POA: Diagnosis not present

## 2017-03-05 DIAGNOSIS — Z170 Estrogen receptor positive status [ER+]: Secondary | ICD-10-CM | POA: Diagnosis not present

## 2017-03-05 DIAGNOSIS — C50111 Malignant neoplasm of central portion of right female breast: Secondary | ICD-10-CM

## 2017-03-05 LAB — CBC WITH DIFFERENTIAL/PLATELET
Basophils Absolute: 0 10*3/uL (ref 0–0.1)
Basophils Relative: 1 %
Eosinophils Absolute: 0.1 10*3/uL (ref 0–0.7)
Eosinophils Relative: 1 %
HCT: 36 % (ref 35.0–47.0)
Hemoglobin: 12.3 g/dL (ref 12.0–16.0)
Lymphocytes Relative: 19 %
Lymphs Abs: 1.8 10*3/uL (ref 1.0–3.6)
MCH: 30.9 pg (ref 26.0–34.0)
MCHC: 34.2 g/dL (ref 32.0–36.0)
MCV: 90.4 fL (ref 80.0–100.0)
Monocytes Absolute: 0.5 10*3/uL (ref 0.2–0.9)
Monocytes Relative: 5 %
Neutro Abs: 6.8 10*3/uL — ABNORMAL HIGH (ref 1.4–6.5)
Neutrophils Relative %: 74 %
Platelets: 427 10*3/uL (ref 150–440)
RBC: 3.99 MIL/uL (ref 3.80–5.20)
RDW: 13.9 % (ref 11.5–14.5)
WBC: 9.2 10*3/uL (ref 3.6–11.0)

## 2017-03-05 LAB — COMPREHENSIVE METABOLIC PANEL
ALT: 17 U/L (ref 14–54)
AST: 22 U/L (ref 15–41)
Albumin: 4.2 g/dL (ref 3.5–5.0)
Alkaline Phosphatase: 68 U/L (ref 38–126)
Anion gap: 7 (ref 5–15)
BUN: 16 mg/dL (ref 6–20)
CO2: 30 mmol/L (ref 22–32)
Calcium: 9.1 mg/dL (ref 8.9–10.3)
Chloride: 101 mmol/L (ref 101–111)
Creatinine, Ser: 0.74 mg/dL (ref 0.44–1.00)
GFR calc Af Amer: >60 mL/min (ref >60)
GFR calc non Af Amer: >60 mL/min (ref >60)
Glucose, Bld: 90 mg/dL (ref 65–99)
Potassium: 3.5 mmol/L (ref 3.5–5.1)
Sodium: 138 mmol/L (ref 135–145)
Total Bilirubin: 0.5 mg/dL (ref 0.3–1.2)
Total Protein: 7.5 g/dL (ref 6.5–8.1)

## 2017-03-05 MED ORDER — VENLAFAXINE HCL ER 37.5 MG PO CP24: 37.5000 mg | ORAL_CAPSULE | Freq: Every day | ORAL | 3 refills | Status: DC

## 2017-03-05 NOTE — Progress Notes (Signed)
Patient offers no complaints today.  Patient requesting refill on Effexor.

## 2017-03-05 NOTE — Progress Notes (Signed)
[facility] day:  03/05/17   Chief Complaint: Emma Atkinson is a 51 y.o. female with clinical stage IIB Her2/neu+ right breast cancer on tamoxifen who is seen for 2 month assessment.  HPI:  The patient was last seen in the medical oncology on 01/03/2017.  At that time, she was feeling better off neratinib (discontinued on 01/01/2017).  Decision was made to discontinue neratnib secondary to quality of life issues.  She was admitted to [facility] from 02/24/2007 - 02/25/2007 with left lower extremity cellulitis. She presented with cellulitis of the left foot after field outpatient treatment. She was treated with IV vancomycin and Levaquin. MRSA screen was negative.  She was discharged on clindamycin for 7 days.   Symptomatically, she "feels like myself".  She completed her reconstruction 12/2016. She notes one area being a little thin.  She denies any concerns.   Past Medical History:  Diagnosis Date  . Anemia    H/O  . Anxiety   . Breast cancer ([place])    breast  . Chronic kidney disease 12-2015   PYLONEPHRITIS  . Complication of anesthesia    HARD TO WAKE UP AFTER TONSILLECTOMY AS A CHILD BUT NO PROBLEMS SINCE  . Complication of anesthesia    PT STATES THAT BACK IN OR DURING PORT PLACMENT IN 2016 THAT SHE BECAME VEERY FLUSHED, LIGHT HEADED AND CRNA SAID ANTIBIOTIC MAY BE GOING IN IN TO FAST-RATE DECREASED AND PTS SYMPTOMS RESOLVED IMMEDIATELY  . Depression   . GERD (gastroesophageal reflux disease)   . Shortness of breath dyspnea     Past Surgical History:  Procedure Laterality Date  . ABDOMINAL HYSTERECTOMY  2001  . BACK SURGERY     FOR SCOLIOSIS-HERRINGTON RODS IN PLACE  . BREAST BIOPSY Bilateral    08/17/2015   . PORTACATH PLACEMENT N/A 08/30/2015   Procedure: INSERTION PORT-A-CATH;  Surgeon: Leonie Green, MD;  Location: [facility] ORS;  Service: General;  Laterality: N/A;  . SENTINEL NODE BIOPSY Bilateral 03/08/2016   Procedure: SENTINEL  NODE BIOPSY;  Surgeon: Leonie Green, MD;  Location: [facility] ORS;  Service: General;  Laterality: Bilateral;  . SIMPLE MASTECTOMY WITH AXILLARY SENTINEL NODE BIOPSY Bilateral 03/08/2016   Procedure: SIMPLE MASTECTOMY;  Surgeon: Leonie Green, MD;  Location: [facility] ORS;  Service: General;  Laterality: Bilateral;  . TONSILLECTOMY      Family History  Problem Relation Age of Onset  . Breast cancer Maternal Aunt 64  . Cancer Father   . Cancer Sister   . Cancer Maternal Grandfather     Social History:  reports that she quit smoking about 17 years ago. Her smoking use included Cigarettes. She has a 10.00 pack-year smoking history. She has never used smokeless tobacco. She reports that she drinks alcohol. She reports that she does not use drugs.  She returned to full-time work on 10/24/2016.  Her daughter is getting married soon. The patient is alone today.  Allergies:  Allergies  Allergen Reactions  . Ceftin [Cefuroxime Axetil] Rash    Current Medications: Current Outpatient Prescriptions  Medication Sig Dispense Refill  . acetaminophen (TYLENOL) 500 MG tablet Take 1,000 mg by mouth every 6 (six) hours as needed for mild pain.     Marland Kitchen ALPRAZolam (XANAX) 0.25 MG tablet Take 0.0625 mg by mouth 3 (three) times daily as needed for anxiety.    . diphenhydrAMINE (SOMINEX) 25 MG tablet Take 25 mg by mouth at bedtime. Reported on 01/12/2016    .  lidocaine-prilocaine (EMLA) cream Apply 1 application topically as needed. Apply at least 1-2 hours before the port is to be used. 30 g 1  . magic mouthwash SOLN Take 5 mLs by mouth 3 (three) times daily as needed for mouth pain. 150 mL 5  . Multiple Vitamin (MULTIVITAMIN) capsule Take 1 capsule by mouth daily.    . ondansetron (ZOFRAN) 4 MG tablet Take 1 tablet (4 mg total) by mouth every 8 (eight) hours as needed for nausea or vomiting. 30 tablet 0  . tamoxifen (NOLVADEX) 20 MG tablet Take 1 tablet (20 mg total) by mouth daily. 30 tablet 5  .  venlafaxine XR (EFFEXOR-XR) 37.5 MG 24 hr capsule Take 1 capsule (37.5 mg total) by mouth daily with breakfast. 30 capsule 3  . potassium chloride (K-DUR) 10 MEQ tablet Take 10 mEq by mouth daily.  1   No current facility-administered medications for this visit.     Review of Systems:  GENERAL:  Feels "like myself".  No fevers or sweats.  Weight up 3 pounds. PERFORMANCE STATUS (ECOG):  0 HEENT:  No visual changes, runny nose, sore throat, mouth sores or tenderness. Lungs: No shortness of breath or cough.  No pleuritic chest pain.  No hemoptysis. Cardiac:  No chest pain, palpitations, orthopnea, or PND. GI:  No vomiting, diarrhea, constipation, melena or hematochezia. GU:  No urgency, frequency, dysuria or hematuria.  Musculoskeletal:  No back pain.  No joint pain.  No muscle tenderness. Extremities:  No pain or swelling. Skin:  No rashes or skin changes.  Interval cellulitis (see HPI). Neuro:  No numbness, weakness, balance or coordination issues. Endocrine:  Mild hot flashes.  No diabetes, thyroid issues, or night sweats. Psych:  Poor sleep.  No mood changes. Pain:  No focal pain. Review of systems:  All other systems reviewed and found to be negative.  Physical Exam: Blood pressure 125/82, pulse 99, temperature 98.8 F (37.1 C), temperature source Tympanic, resp. rate 18, weight 107 lb 5 oz (48.7 kg). O2 sats 94% with ambulation. GENERAL:  Thin tan, well appearing, woman sitting comfortably in the exam room in no acute distress.  MENTAL STATUS:  Alert and oriented to person, place and time. HEAD:  Short styled hair.  Normocephalic, atraumatic, face symmetric (thin), no Cushingoid features. EYES:  Blue eyes.  Pupils equal round and reactive to light and accomodation.  No conjunctivitis or scleral icterus. ENT:  Oropharynx clear without lesion.  Tongue normal. Mucous membranes moist.  RESPIRATORY:  Clear to auscultation without rales, wheezes or rhonchi. CARDIOVASCULAR:  Regular rate  and rhythm without murmur, rub or gallop. No JVD. ABDOMEN:  Soft, non-tender, with active bowel sounds, and no hepatosplenomegaly.  No masses. SKIN:  Tan.  No rashes, ulcers or lesions. EXTREMITIES: No edema, no skin discoloration or tenderness.  No palpable cords. LYMPH NODES: No palpable cervical, supraclavicular, axillary or inguinal adenopathy  NEUROLOGICAL: Unremarkable. PSYCH:  Appropriate.    Appointment on 03/05/2017  Component Date Value Ref Range Status  . Sodium 03/05/2017 138  135 - 145 mmol/L Final  . Potassium 03/05/2017 3.5  3.5 - 5.1 mmol/L Final  . Chloride 03/05/2017 101  101 - 111 mmol/L Final  . CO2 03/05/2017 30  22 - 32 mmol/L Final  . Glucose, Bld 03/05/2017 90  65 - 99 mg/dL Final  . BUN 03/05/2017 16  6 - 20 mg/dL Final  . Creatinine, Ser 03/05/2017 0.74  0.44 - 1.00 mg/dL Final  . Calcium 03/05/2017 9.1  8.9 -  10.3 mg/dL Final  . Total Protein 03/05/2017 7.5  6.5 - 8.1 g/dL Final  . Albumin 03/05/2017 4.2  3.5 - 5.0 g/dL Final  . AST 03/05/2017 22  15 - 41 U/L Final  . ALT 03/05/2017 17  14 - 54 U/L Final  . Alkaline Phosphatase 03/05/2017 68  38 - 126 U/L Final  . Total Bilirubin 03/05/2017 0.5  0.3 - 1.2 mg/dL Final  . GFR calc non Af Amer 03/05/2017 >60  >60 mL/min Final  . GFR calc Af Amer 03/05/2017 >60  >60 mL/min Final   Comment: (NOTE) The eGFR has been calculated using the CKD EPI equation. This calculation has not been validated in all clinical situations. eGFR's persistently <60 mL/min signify possible Chronic Kidney Disease.   . Anion gap 03/05/2017 7  5 - 15 Final  . WBC 03/05/2017 9.2  3.6 - 11.0 K/uL Final  . RBC 03/05/2017 3.99  3.80 - 5.20 MIL/uL Final  . Hemoglobin 03/05/2017 12.3  12.0 - 16.0 g/dL Final  . HCT 03/05/2017 36.0  35.0 - 47.0 % Final  . MCV 03/05/2017 90.4  80.0 - 100.0 fL Final  . MCH 03/05/2017 30.9  26.0 - 34.0 pg Final  . MCHC 03/05/2017 34.2  32.0 - 36.0 g/dL Final  . RDW 03/05/2017 13.9  11.5 - 14.5 % Final  .  Platelets 03/05/2017 427  150 - 440 K/uL Final  . Neutrophils Relative % 03/05/2017 74  % Final  . Neutro Abs 03/05/2017 6.8* 1.4 - 6.5 K/uL Final  . Lymphocytes Relative 03/05/2017 19  % Final  . Lymphs Abs 03/05/2017 1.8  1.0 - 3.6 K/uL Final  . Monocytes Relative 03/05/2017 5  % Final  . Monocytes Absolute 03/05/2017 0.5  0.2 - 0.9 K/uL Final  . Eosinophils Relative 03/05/2017 1  % Final  . Eosinophils Absolute 03/05/2017 0.1  0 - 0.7 K/uL Final  . Basophils Relative 03/05/2017 1  % Final  . Basophils Absolute 03/05/2017 0.0  0 - 0.1 K/uL Final    Assessment:  Emma Atkinson is a 51 y.o. female with clinical stage T2N1 (stage IIB) Her2/neu + right breast cancer s/p neoadjuvant chemotherapy and bilateral mastectomy.  She completed a year of adjuvant Herceptin.  She presented with a minimally painful right breast mass with nipple inversion.  Bilateral mammogram and ultrasound on 08/12/2015 revealed a 4.1 cm right superior breast mass containing pleomorphic microcalcifications. There were several satellite lesions (1.3 cm, 0.9 cm, 0.6 cm) plus a 1.3 cm mass versus complicated cyst and a 0.8 cm cyst.  There was one 5 mm right axillary node with irregular thickened cortex.   Core needle biopsy on 08/17/2015 of the right breast mass revealed a grade 3 invasive mammary carcinoma. Right axillary biopsy confirmed metastatic mammary carcinoma. Tumor was ER positive (1-10%), PR positive (11-50%) and HER-2/neu 3+  PET scan on 08/26/2015 revealed hypermetabolic right breast mass (SUV 11.3) with a hypermetabolic right axillary lymph node and a subtle focus of very faint hypermetabolic activity laterally in the right breast. There was faint hypermetabolic activity at the biopsy site of the left breast (? biopsy related).    CA27.29 has been followed: 19.6 on 08/22/2015, 24.7 on 01/10/2016, 15.5 on 03/23/2016, and 16.3 on 12/06/2016.  She received 4 cycles of AC (09/13/2015 - 11/08/2015) with Neulasta  support.  She received 4 cycles of Herceptin and Perjeta (11/29/2015 - 01/31/2016) and 12 weeks of Taxol (11/29/2015 - 02/14/2016).  She tolerated her chemotherapy well.  She developed a grade I neuropathy.  She completed a year of adjuvant Herceptin on 11/15/2016.  She began tamoxifen on 07/05/2016.  She is on Effexor for vasomotor symptoms.  Bilateral mastectomy on 03/08/2016 revealed no residual disease in the right breast.  Five lymph nodes were negative for malignancy.  Pathologic stage was ypT0 ypN0.  Left breast revealed benign intraductal papillomas and cysts adjacent to the biopsy site.  There was no atypia or malignancy.  She is considering reconstruction in 09/2016 or 10/2016.  She received 50.4 Gy right chest wall radiation from 05/07/2016 - 06/25/2016.  She is planning reconstruction at the end of 09/2016 or beginning of 10/2016.  She is s/p partial hysterectomy (ovaries remain) in 2001.  She was premenopausal ([place] 5 and estradiol 271.9) on 08/22/2015.  Echo on 08/26/2015 revealed an ejection fraction of 55-60%.  Echo on 11/25/2015 revealed an EF of 60-65%.  Echo on 02/23/2016 and 05/25/2016 revealed an EF of 55-65%.  Echo on 08/31/2016 revealed an EF of 50-55%.  Echo on 11/29/2016 revealed an EF of 60-65%.  She underwent reconstructive surgery on 10/10/2016.  Reconstruction completed on 01/11/2017.  She has a family history of breast and ovarian cancer.  My Risk genetic testing revealed no mutation on 08/22/2015.  She was on neratinib from 12/06/2016 - 01/01/2017.  She had significant diarrhea affecting quality of life.  She has issues with constipation (baseline on Miralax).  She was admitted to [facility] from 02/24/2007 - 02/25/2007 with left foot extremity cellulitis. She was treated with IV vancomycin and Levaquin. MRSA screen was negative.  She was discharged on clindamycin for 7 days.   Symptomatically, she denies any complaint.  Exam is normal.  Plan: 1.  Labs today:  CBC with  diff, CMP, CA27.29. 2.  Refill Effexor XR 37.5 mg po q day. 3.  RTC in 4 months for MD assess and labs (CBC with diff, CMP, CA27.29).   Lequita Asal, MD  03/05/2017, 3:45 PM

## 2017-03-06 LAB — CANCER ANTIGEN 27.29: CA 27.29: 17.4 U/mL (ref 0.0–38.6)

## 2017-03-20 DIAGNOSIS — Z853 Personal history of malignant neoplasm of breast: Secondary | ICD-10-CM | POA: Diagnosis not present

## 2017-03-25 ENCOUNTER — Inpatient Hospital Stay: Payer: 59 | Attending: Hematology and Oncology

## 2017-03-25 DIAGNOSIS — Z170 Estrogen receptor positive status [ER+]: Secondary | ICD-10-CM | POA: Diagnosis not present

## 2017-03-25 DIAGNOSIS — Z79810 Long term (current) use of selective estrogen receptor modulators (SERMs): Secondary | ICD-10-CM | POA: Insufficient documentation

## 2017-03-25 DIAGNOSIS — C50111 Malignant neoplasm of central portion of right female breast: Secondary | ICD-10-CM | POA: Diagnosis not present

## 2017-03-25 DIAGNOSIS — Z452 Encounter for adjustment and management of vascular access device: Secondary | ICD-10-CM | POA: Diagnosis not present

## 2017-03-25 DIAGNOSIS — Z95828 Presence of other vascular implants and grafts: Secondary | ICD-10-CM

## 2017-03-25 MED ORDER — HEPARIN SOD (PORK) LOCK FLUSH 100 UNIT/ML IV SOLN
500.0000 [IU] | Freq: Once | INTRAVENOUS | Status: AC
  Administered 2017-03-25: 500 [IU] via INTRAVENOUS

## 2017-03-25 MED ORDER — SODIUM CHLORIDE 0.9% FLUSH
10.0000 mL | INTRAVENOUS | Status: DC | PRN
  Administered 2017-03-25: 10 mL via INTRAVENOUS
  Filled 2017-03-25: qty 10

## 2017-04-23 ENCOUNTER — Telehealth: Payer: Self-pay | Admitting: *Deleted

## 2017-04-23 ENCOUNTER — Other Ambulatory Visit: Payer: Self-pay | Admitting: *Deleted

## 2017-04-23 DIAGNOSIS — Z853 Personal history of malignant neoplasm of breast: Secondary | ICD-10-CM

## 2017-04-23 NOTE — Telephone Encounter (Signed)
Has noticed in past few weeks that she is very fatigued and sleeping a lot. Also states that she is becoming forgetful and feels depressed. Asking if her venlafaxine needs to be increased. Please advise

## 2017-04-23 NOTE — Telephone Encounter (Addendum)
Dr Mike Gip wants to see patient Thursday to discuss sx. She has agreed to be here at 315 for lab then see md afterwards

## 2017-04-25 ENCOUNTER — Other Ambulatory Visit: Payer: Self-pay | Admitting: *Deleted

## 2017-04-25 ENCOUNTER — Encounter: Payer: Self-pay | Admitting: *Deleted

## 2017-04-25 ENCOUNTER — Encounter: Payer: Self-pay | Admitting: Hematology and Oncology

## 2017-04-25 ENCOUNTER — Inpatient Hospital Stay: Payer: 59 | Attending: Hematology and Oncology

## 2017-04-25 ENCOUNTER — Inpatient Hospital Stay (HOSPITAL_BASED_OUTPATIENT_CLINIC_OR_DEPARTMENT_OTHER): Payer: 59 | Admitting: Hematology and Oncology

## 2017-04-25 VITALS — BP 151/88 | HR 84 | Temp 98.4°F | Resp 18 | Wt 110.6 lb

## 2017-04-25 DIAGNOSIS — Z803 Family history of malignant neoplasm of breast: Secondary | ICD-10-CM | POA: Insufficient documentation

## 2017-04-25 DIAGNOSIS — C50111 Malignant neoplasm of central portion of right female breast: Secondary | ICD-10-CM

## 2017-04-25 DIAGNOSIS — Z87891 Personal history of nicotine dependence: Secondary | ICD-10-CM | POA: Insufficient documentation

## 2017-04-25 DIAGNOSIS — K219 Gastro-esophageal reflux disease without esophagitis: Secondary | ICD-10-CM | POA: Diagnosis not present

## 2017-04-25 DIAGNOSIS — F32A Depression, unspecified: Secondary | ICD-10-CM

## 2017-04-25 DIAGNOSIS — Z8041 Family history of malignant neoplasm of ovary: Secondary | ICD-10-CM

## 2017-04-25 DIAGNOSIS — Z87442 Personal history of urinary calculi: Secondary | ICD-10-CM

## 2017-04-25 DIAGNOSIS — Z170 Estrogen receptor positive status [ER+]: Secondary | ICD-10-CM | POA: Diagnosis not present

## 2017-04-25 DIAGNOSIS — R51 Headache: Secondary | ICD-10-CM

## 2017-04-25 DIAGNOSIS — F419 Anxiety disorder, unspecified: Secondary | ICD-10-CM | POA: Diagnosis not present

## 2017-04-25 DIAGNOSIS — Z79899 Other long term (current) drug therapy: Secondary | ICD-10-CM | POA: Insufficient documentation

## 2017-04-25 DIAGNOSIS — R0602 Shortness of breath: Secondary | ICD-10-CM | POA: Insufficient documentation

## 2017-04-25 DIAGNOSIS — G629 Polyneuropathy, unspecified: Secondary | ICD-10-CM | POA: Diagnosis not present

## 2017-04-25 DIAGNOSIS — R413 Other amnesia: Secondary | ICD-10-CM

## 2017-04-25 DIAGNOSIS — F329 Major depressive disorder, single episode, unspecified: Secondary | ICD-10-CM | POA: Insufficient documentation

## 2017-04-25 DIAGNOSIS — Z853 Personal history of malignant neoplasm of breast: Secondary | ICD-10-CM

## 2017-04-25 DIAGNOSIS — R5383 Other fatigue: Secondary | ICD-10-CM | POA: Insufficient documentation

## 2017-04-25 DIAGNOSIS — G4700 Insomnia, unspecified: Secondary | ICD-10-CM

## 2017-04-25 DIAGNOSIS — Z79810 Long term (current) use of selective estrogen receptor modulators (SERMs): Secondary | ICD-10-CM | POA: Diagnosis not present

## 2017-04-25 LAB — COMPREHENSIVE METABOLIC PANEL
ALT: 17 U/L (ref 14–54)
AST: 23 U/L (ref 15–41)
Albumin: 4.1 g/dL (ref 3.5–5.0)
Alkaline Phosphatase: 65 U/L (ref 38–126)
Anion gap: 8 (ref 5–15)
BUN: 14 mg/dL (ref 6–20)
CHLORIDE: 102 mmol/L (ref 101–111)
CO2: 28 mmol/L (ref 22–32)
CREATININE: 0.66 mg/dL (ref 0.44–1.00)
Calcium: 9.8 mg/dL (ref 8.9–10.3)
GFR calc Af Amer: >60 mL/min (ref >60)
GFR calc non Af Amer: >60 mL/min (ref >60)
Glucose, Bld: 91 mg/dL (ref 65–99)
POTASSIUM: 4 mmol/L (ref 3.5–5.1)
SODIUM: 138 mmol/L (ref 135–145)
Total Bilirubin: 0.7 mg/dL (ref 0.3–1.2)
Total Protein: 7.1 g/dL (ref 6.5–8.1)

## 2017-04-25 LAB — CBC WITH DIFFERENTIAL/PLATELET
BASOS ABS: 0 10*3/uL (ref 0–0.1)
BASOS PCT: 0 %
EOS ABS: 0.1 10*3/uL (ref 0–0.7)
EOS PCT: 1 %
HCT: 35.3 % (ref 35.0–47.0)
Hemoglobin: 12 g/dL (ref 12.0–16.0)
Lymphocytes Relative: 16 %
Lymphs Abs: 1.2 10*3/uL (ref 1.0–3.6)
MCH: 30.8 pg (ref 26.0–34.0)
MCHC: 34.1 g/dL (ref 32.0–36.0)
MCV: 90.3 fL (ref 80.0–100.0)
MONO ABS: 0.4 10*3/uL (ref 0.2–0.9)
Monocytes Relative: 6 %
Neutro Abs: 5.7 10*3/uL (ref 1.4–6.5)
Neutrophils Relative %: 77 %
PLATELETS: 300 10*3/uL (ref 150–440)
RBC: 3.91 MIL/uL (ref 3.80–5.20)
RDW: 13.6 % (ref 11.5–14.5)
WBC: 7.4 10*3/uL (ref 3.6–11.0)

## 2017-04-25 NOTE — Progress Notes (Signed)
Patient here today as acute visit because she is fatigued and depressed.  States she has taken benadryl for years to help her sleep but stopped taking it.  She states she is now sleeping too much.  She is fatigued no matter how much sleep she gets.  Also states she is having some memory loss.  Forgets what she is talking about in the middle of a sentence.

## 2017-04-25 NOTE — Progress Notes (Signed)
[facility] day:  04/25/17   Chief Complaint: Emma Atkinson is a 51 y.o. female with clinical stage IIB Her2/neu+ right breast cancer on tamoxifen who is seen for sick call visit.  HPI:  The patient was last seen in the medical oncology on 03/05/2017.  At that time, she felt well and was back to her baseline health.  She was scheduled for 4 month follow-up.  She contacted the clinic on 04/23/2017 with symptoms of extreme fatigue, forgetfulness, and depression.  She states that she has "not felt right". I was feeling tired all the time and not getting enough sleep. She is working 40 hours a week. She has trouble getting up in the morning. She stopped taking Benadryl to sleep does not note a difference. She does not feel rested in the morning. Next  She notes that she "got a little depressed". She has terrible memory problems. She can easily lose her train of thought. She has trouble multi-tasking.  She does not believe she has sleep apnea.  She can fall asleep in the shower. She has a few headaches behind her eyes. She denies any numbness, weakness, balance or coordination issues.  Her husband notes that she drinks wine after work.   Past Medical History:  Diagnosis Date  . Anemia    H/O  . Anxiety   . Breast cancer ([place])    breast  . Chronic kidney disease 12-2015   PYLONEPHRITIS  . Complication of anesthesia    HARD TO WAKE UP AFTER TONSILLECTOMY AS A CHILD BUT NO PROBLEMS SINCE  . Complication of anesthesia    PT STATES THAT BACK IN OR DURING PORT PLACMENT IN 2016 THAT SHE BECAME VEERY FLUSHED, LIGHT HEADED AND CRNA SAID ANTIBIOTIC MAY BE GOING IN IN TO FAST-RATE DECREASED AND PTS SYMPTOMS RESOLVED IMMEDIATELY  . Depression   . GERD (gastroesophageal reflux disease)   . Shortness of breath dyspnea     Past Surgical History:  Procedure Laterality Date  . ABDOMINAL HYSTERECTOMY  2001  . BACK SURGERY     FOR SCOLIOSIS-HERRINGTON RODS IN  PLACE  . BREAST BIOPSY Bilateral    08/17/2015   . PORTACATH PLACEMENT N/A 08/30/2015   Procedure: INSERTION PORT-A-CATH;  Surgeon: Leonie Green, MD;  Location: [facility] ORS;  Service: General;  Laterality: N/A;  . SENTINEL NODE BIOPSY Bilateral 03/08/2016   Procedure: SENTINEL NODE BIOPSY;  Surgeon: Leonie Green, MD;  Location: [facility] ORS;  Service: General;  Laterality: Bilateral;  . SIMPLE MASTECTOMY WITH AXILLARY SENTINEL NODE BIOPSY Bilateral 03/08/2016   Procedure: SIMPLE MASTECTOMY;  Surgeon: Leonie Green, MD;  Location: [facility] ORS;  Service: General;  Laterality: Bilateral;  . TONSILLECTOMY      Family History  Problem Relation Age of Onset  . Breast cancer Maternal Aunt 64  . Cancer Father   . Cancer Sister   . Cancer Maternal Grandfather     Social History:  reports that she quit smoking about 17 years ago. Her smoking use included Cigarettes. She has a 10.00 pack-year smoking history. She has never used smokeless tobacco. She reports that she drinks alcohol. She reports that she does not use drugs.  Her husband states that she drinks 2-4 bottles of wine/week.  She returned to full-time work on 10/24/2016.  Her daughter is getting married soon. The patient is accompanied by her husband today.  Allergies:  Allergies  Allergen Reactions  . Ceftin [Cefuroxime Axetil] Rash  Current Medications: Current Outpatient Prescriptions  Medication Sig Dispense Refill  . acetaminophen (TYLENOL) 500 MG tablet Take 1,000 mg by mouth every 6 (six) hours as needed for mild pain.     Marland Kitchen ALPRAZolam (XANAX) 0.25 MG tablet Take 0.0625 mg by mouth 3 (three) times daily as needed for anxiety.    . lidocaine-prilocaine (EMLA) cream Apply 1 application topically as needed. Apply at least 1-2 hours before the port is to be used. 30 g 1  . magic mouthwash SOLN Take 5 mLs by mouth 3 (three) times daily as needed for mouth pain. 150 mL 5  . Multiple Vitamin (MULTIVITAMIN) capsule Take 1  capsule by mouth daily.    . ondansetron (ZOFRAN) 4 MG tablet Take 1 tablet (4 mg total) by mouth every 8 (eight) hours as needed for nausea or vomiting. 30 tablet 0  . tamoxifen (NOLVADEX) 20 MG tablet Take 1 tablet (20 mg total) by mouth daily. 30 tablet 5  . venlafaxine XR (EFFEXOR-XR) 37.5 MG 24 hr capsule Take 1 capsule (37.5 mg total) by mouth daily with breakfast. 30 capsule 3  . diphenhydrAMINE (SOMINEX) 25 MG tablet Take 25 mg by mouth at bedtime. Reported on 01/12/2016    . potassium chloride (K-DUR) 10 MEQ tablet Take 10 mEq by mouth daily.  1   No current facility-administered medications for this visit.     Review of Systems:  GENERAL:  Feels "not right".  Tired all of the time.  No fevers or sweats.  Weight up 3 pounds. PERFORMANCE STATUS (ECOG):  0 HEENT:  No visual changes, runny nose, sore throat, mouth sores or tenderness. Lungs: No shortness of breath or cough.  No pleuritic chest pain.  No hemoptysis. Cardiac:  No chest pain, palpitations, orthopnea, or PND. GI:  No vomiting, diarrhea, melena or hematochezia.  Baseline constipation. GU:  No urgency, frequency, dysuria or hematuria.  Musculoskeletal:  No back pain.  No joint pain.  No muscle tenderness. Extremities:  No pain or swelling. Skin:  No rashes or skin changes. Neuro:  Rare headache.  No numbness, weakness, balance or coordination issues. Endocrine:  Mild hot flashes.  No diabetes, thyroid issues, or night sweats. Psych:  Poor sleep.  No mood changes.  Trouble getting up in the morning. Pain:  No focal pain. Review of systems:  All other systems reviewed and found to be negative.  Physical Exam: Blood pressure (!) 151/88, pulse 84, temperature 98.4 F (36.9 C), temperature source Tympanic, resp. rate 18, weight 110 lb 9 oz (50.2 kg). O2 sats 94% with ambulation. GENERAL:  Thin tan, well appearing, woman sitting comfortably in the exam room in no acute distress.  MENTAL STATUS:  Alert and oriented to person,  place and time. HEAD:  Short styled hair.  Normocephalic, atraumatic, face symmetric (thin), no Cushingoid features. EYES:  Blue eyes.  Pupils equal round and reactive to light and accomodation.  No conjunctivitis or scleral icterus. ENT:  Oropharynx clear without lesion.  Tongue normal. Mucous membranes moist.  RESPIRATORY:  Clear to auscultation without rales, wheezes or rhonchi. CARDIOVASCULAR:  Regular rate and rhythm without murmur, rub or gallop. No JVD. ABDOMEN:  Soft, non-tender, with active bowel sounds, and no hepatosplenomegaly.  No masses. SKIN:  Tan.  No rashes, ulcers or lesions. EXTREMITIES: No edema, no skin discoloration or tenderness.  No palpable cords. LYMPH NODES: No palpable cervical, supraclavicular, axillary or inguinal adenopathy  NEUROLOGICAL: Intact.  Remembers 6 of 12 words. PSYCH:  Appropriate.  Appointment on 04/25/2017  Component Date Value Ref Range Status  . WBC 04/25/2017 7.4  3.6 - 11.0 K/uL Final  . RBC 04/25/2017 3.91  3.80 - 5.20 MIL/uL Final  . Hemoglobin 04/25/2017 12.0  12.0 - 16.0 g/dL Final  . HCT 04/25/2017 35.3  35.0 - 47.0 % Final  . MCV 04/25/2017 90.3  80.0 - 100.0 fL Final  . MCH 04/25/2017 30.8  26.0 - 34.0 pg Final  . MCHC 04/25/2017 34.1  32.0 - 36.0 g/dL Final  . RDW 04/25/2017 13.6  11.5 - 14.5 % Final  . Platelets 04/25/2017 300  150 - 440 K/uL Final  . Neutrophils Relative % 04/25/2017 77  % Final  . Neutro Abs 04/25/2017 5.7  1.4 - 6.5 K/uL Final  . Lymphocytes Relative 04/25/2017 16  % Final  . Lymphs Abs 04/25/2017 1.2  1.0 - 3.6 K/uL Final  . Monocytes Relative 04/25/2017 6  % Final  . Monocytes Absolute 04/25/2017 0.4  0.2 - 0.9 K/uL Final  . Eosinophils Relative 04/25/2017 1  % Final  . Eosinophils Absolute 04/25/2017 0.1  0 - 0.7 K/uL Final  . Basophils Relative 04/25/2017 0  % Final  . Basophils Absolute 04/25/2017 0.0  0 - 0.1 K/uL Final  . Sodium 04/25/2017 138  135 - 145 mmol/L Final  . Potassium 04/25/2017 4.0   3.5 - 5.1 mmol/L Final  . Chloride 04/25/2017 102  101 - 111 mmol/L Final  . CO2 04/25/2017 28  22 - 32 mmol/L Final  . Glucose, Bld 04/25/2017 91  65 - 99 mg/dL Final  . BUN 04/25/2017 14  6 - 20 mg/dL Final  . Creatinine, Ser 04/25/2017 0.66  0.44 - 1.00 mg/dL Final  . Calcium 04/25/2017 9.8  8.9 - 10.3 mg/dL Final  . Total Protein 04/25/2017 7.1  6.5 - 8.1 g/dL Final  . Albumin 04/25/2017 4.1  3.5 - 5.0 g/dL Final  . AST 04/25/2017 23  15 - 41 U/L Final  . ALT 04/25/2017 17  14 - 54 U/L Final  . Alkaline Phosphatase 04/25/2017 65  38 - 126 U/L Final  . Total Bilirubin 04/25/2017 0.7  0.3 - 1.2 mg/dL Final  . GFR calc non Af Amer 04/25/2017 >60  >60 mL/min Final  . GFR calc Af Amer 04/25/2017 >60  >60 mL/min Final   Comment: (NOTE) The eGFR has been calculated using the CKD EPI equation. This calculation has not been validated in all clinical situations. eGFR's persistently <60 mL/min signify possible Chronic Kidney Disease.   . Anion gap 04/25/2017 8  5 - 15 Final    Assessment:  Emma Atkinson is a 51 y.o. female with clinical stage T2N1 (stage IIB) Her2/neu + right breast cancer s/p neoadjuvant chemotherapy and bilateral mastectomy.  She completed a year of adjuvant Herceptin.  She presented with a minimally painful right breast mass with nipple inversion.  Bilateral mammogram and ultrasound on 08/12/2015 revealed a 4.1 cm right superior breast mass containing pleomorphic microcalcifications. There were several satellite lesions (1.3 cm, 0.9 cm, 0.6 cm) plus a 1.3 cm mass versus complicated cyst and a 0.8 cm cyst.  There was one 5 mm right axillary node with irregular thickened cortex.   Core needle biopsy on 08/17/2015 of the right breast mass revealed a grade 3 invasive mammary carcinoma. Right axillary biopsy confirmed metastatic mammary carcinoma. Tumor was ER positive (1-10%), PR positive (11-50%) and HER-2/neu 3+  PET scan on 08/26/2015 revealed hypermetabolic right breast  mass (SUV  11.3) with a hypermetabolic right axillary lymph node and a subtle focus of very faint hypermetabolic activity laterally in the right breast. There was faint hypermetabolic activity at the biopsy site of the left breast (? biopsy related).    CA27.29 has been followed: 19.6 on 08/22/2015, 24.7 on 01/10/2016, 15.5 on 03/23/2016, 16.3 on 12/06/2016, and 17.4 on 03/05/2017.  She received 4 cycles of AC (09/13/2015 - 11/08/2015) with Neulasta support.  She received 4 cycles of Herceptin and Perjeta (11/29/2015 - 01/31/2016) and 12 weeks of Taxol (11/29/2015 - 02/14/2016).  She tolerated her chemotherapy well.  She developed a grade I neuropathy.  She completed a year of adjuvant Herceptin on 11/15/2016.  She began tamoxifen on 07/05/2016.    She was on neratinib from 12/06/2016 - 01/01/2017.  She had significant diarrhea affecting quality of life.  She has ssues with constipation (baseline on Miralax).  She is on Effexor for vasomotor symptoms.  Bilateral mastectomy on 03/08/2016 revealed no residual disease in the right breast.  Five lymph nodes were negative for malignancy.  Pathologic stage was ypT0 ypN0.  Left breast revealed benign intraductal papillomas and cysts adjacent to the biopsy site.  There was no atypia or malignancy.  She is considering reconstruction in 09/2016 or 10/2016.  She received 50.4 Gy right chest wall radiation from 05/07/2016 - 06/25/2016.  She underwent reconstructive surgery on 10/10/2016.  She completed reconstruction on  01/11/2017.  She is s/p partial hysterectomy (ovaries remain) in 2001.  She was premenopausal ([place] 5 and estradiol 271.9) on 08/22/2015.  Echo on 08/26/2015 revealed an ejection fraction of 55-60%.  Echo on 11/25/2015 revealed an EF of 60-65%.  Echo on 02/23/2016 and 05/25/2016 revealed an EF of 55-65%.  Echo on 08/31/2016 revealed an EF of 50-55%.  Echo on 11/29/2016 revealed an EF of 60-65%.  She has a family history of breast and ovarian cancer.   My Risk genetic testing revealed no mutation on 08/22/2015.  Symptomatically, she noted fatigue and memory issues.  She can remember 6 of 12 words.  She denies any headaches, numbness, weakness, balance or coordination issues.  Plan: 1.  Labs today:  CBC with diff, CMP, CA27.29. 2.  Discuss current symptoms.  She appears to have chemotherapy induced cognitive changes.  She may have sleep apnea (patient denies).  Doubt CNS metastasis.  Offer head MRI if any concerns.   3.  Discuss consideration of clinical trial enrollment.  Trial involves memory testing, blood draws, taking donepezil (Aricept) or placebo x 24 weeks, and phone interviews and clinic visits during a 36 week period or 9 months. 4.  Patient met with clinical trials today.  Information provided. 5.  Discuss adding TSH to labs. 6.  Patient to contact clinic if interested in clinical trial. 7.  RTC as previously scheduled.   Lequita Asal, MD  04/25/2017, 4:40 PM

## 2017-04-26 LAB — CA 27.29 (SERIAL MONITOR): CAN 27.29: 11.7 U/mL (ref 0.0–38.6)

## 2017-04-29 ENCOUNTER — Telehealth: Payer: Self-pay | Admitting: *Deleted

## 2017-04-29 NOTE — Telephone Encounter (Signed)
Called patient to inform her that CA 27.29 is normal.

## 2017-04-29 NOTE — Telephone Encounter (Signed)
-----   Message from Lequita Asal, MD sent at 04/29/2017  1:37 PM EDT ----- Regarding: Please call patient  CA27.29 is normal.  M  ----- Message ----- From: Sindy Guadeloupe, MD Sent: 04/29/2017   7:55 AM To: Lequita Asal, MD    ----- Message ----- From: Interface, Lab In [facility] Sent: 04/25/2017   3:33 PM To: Sindy Guadeloupe, MD

## 2017-05-01 ENCOUNTER — Other Ambulatory Visit: Payer: Self-pay | Admitting: Hematology and Oncology

## 2017-05-01 ENCOUNTER — Telehealth: Payer: Self-pay | Admitting: *Deleted

## 2017-05-01 DIAGNOSIS — R5383 Other fatigue: Secondary | ICD-10-CM

## 2017-05-01 DIAGNOSIS — R5382 Chronic fatigue, unspecified: Secondary | ICD-10-CM | POA: Insufficient documentation

## 2017-05-01 NOTE — Telephone Encounter (Signed)
Patient reports that Dr Mike Gip was going to order a TSH and that it has not been done. She is coming in for a port flush Monday and is asking if the TSH can be ordered and drawn then. Please advise and return call to patient [phone]

## 2017-05-01 NOTE — Telephone Encounter (Signed)
  It was to be added to her labs at last visit.  I reordered it for Monday.  Thanks,  M

## 2017-05-02 ENCOUNTER — Other Ambulatory Visit: Payer: Self-pay | Admitting: *Deleted

## 2017-05-06 ENCOUNTER — Inpatient Hospital Stay: Payer: 59

## 2017-05-06 DIAGNOSIS — R5383 Other fatigue: Secondary | ICD-10-CM | POA: Diagnosis not present

## 2017-05-06 DIAGNOSIS — G4700 Insomnia, unspecified: Secondary | ICD-10-CM | POA: Diagnosis not present

## 2017-05-06 DIAGNOSIS — G629 Polyneuropathy, unspecified: Secondary | ICD-10-CM | POA: Diagnosis not present

## 2017-05-06 DIAGNOSIS — Z95828 Presence of other vascular implants and grafts: Secondary | ICD-10-CM

## 2017-05-06 DIAGNOSIS — R51 Headache: Secondary | ICD-10-CM | POA: Diagnosis not present

## 2017-05-06 DIAGNOSIS — C50111 Malignant neoplasm of central portion of right female breast: Secondary | ICD-10-CM | POA: Diagnosis not present

## 2017-05-06 DIAGNOSIS — R413 Other amnesia: Secondary | ICD-10-CM | POA: Diagnosis not present

## 2017-05-06 DIAGNOSIS — Z170 Estrogen receptor positive status [ER+]: Secondary | ICD-10-CM | POA: Diagnosis not present

## 2017-05-06 DIAGNOSIS — F329 Major depressive disorder, single episode, unspecified: Secondary | ICD-10-CM | POA: Diagnosis not present

## 2017-05-06 DIAGNOSIS — Z79810 Long term (current) use of selective estrogen receptor modulators (SERMs): Secondary | ICD-10-CM | POA: Diagnosis not present

## 2017-05-06 LAB — T4, FREE: Free T4: 0.88 ng/dL (ref 0.61–1.12)

## 2017-05-06 LAB — TSH: TSH: 1.454 u[IU]/mL (ref 0.350–4.500)

## 2017-05-06 MED ORDER — HEPARIN SOD (PORK) LOCK FLUSH 100 UNIT/ML IV SOLN
500.0000 [IU] | Freq: Once | INTRAVENOUS | Status: AC
  Administered 2017-05-06: 500 [IU] via INTRAVENOUS

## 2017-05-06 MED ORDER — SODIUM CHLORIDE 0.9% FLUSH
10.0000 mL | INTRAVENOUS | Status: DC | PRN
  Administered 2017-05-06: 10 mL via INTRAVENOUS
  Filled 2017-05-06: qty 10

## 2017-05-07 ENCOUNTER — Telehealth: Payer: Self-pay | Admitting: *Deleted

## 2017-05-07 NOTE — Telephone Encounter (Signed)
-----   Message from Lequita Asal, MD sent at 05/06/2017  4:08 PM EDT ----- Regarding: Please call patient  Thyroid function tests were normal.  M  ----- Message ----- From: Interface, Lab In [facility] Sent: 05/06/2017   3:57 PM To: Lequita Asal, MD

## 2017-05-07 NOTE — Telephone Encounter (Signed)
Called patient and LVM that thyroid function tests are normal.

## 2017-06-26 ENCOUNTER — Other Ambulatory Visit: Payer: Self-pay | Admitting: Hematology and Oncology

## 2017-06-26 DIAGNOSIS — C50811 Malignant neoplasm of overlapping sites of right female breast: Secondary | ICD-10-CM

## 2017-06-26 DIAGNOSIS — Z170 Estrogen receptor positive status [ER+]: Principal | ICD-10-CM

## 2017-07-01 ENCOUNTER — Other Ambulatory Visit: Payer: Self-pay | Admitting: *Deleted

## 2017-07-01 ENCOUNTER — Inpatient Hospital Stay (HOSPITAL_BASED_OUTPATIENT_CLINIC_OR_DEPARTMENT_OTHER): Payer: 59 | Admitting: Hematology and Oncology

## 2017-07-01 ENCOUNTER — Encounter: Payer: Self-pay | Admitting: Hematology and Oncology

## 2017-07-01 ENCOUNTER — Inpatient Hospital Stay: Payer: 59 | Attending: Hematology and Oncology

## 2017-07-01 VITALS — BP 141/91 | HR 92 | Temp 98.2°F | Resp 18 | Wt 112.2 lb

## 2017-07-01 DIAGNOSIS — Z803 Family history of malignant neoplasm of breast: Secondary | ICD-10-CM

## 2017-07-01 DIAGNOSIS — F329 Major depressive disorder, single episode, unspecified: Secondary | ICD-10-CM | POA: Diagnosis not present

## 2017-07-01 DIAGNOSIS — Z79810 Long term (current) use of selective estrogen receptor modulators (SERMs): Secondary | ICD-10-CM | POA: Diagnosis not present

## 2017-07-01 DIAGNOSIS — Z7901 Long term (current) use of anticoagulants: Secondary | ICD-10-CM | POA: Insufficient documentation

## 2017-07-01 DIAGNOSIS — Z853 Personal history of malignant neoplasm of breast: Secondary | ICD-10-CM

## 2017-07-01 DIAGNOSIS — Z8619 Personal history of other infectious and parasitic diseases: Secondary | ICD-10-CM

## 2017-07-01 DIAGNOSIS — Z809 Family history of malignant neoplasm, unspecified: Secondary | ICD-10-CM | POA: Insufficient documentation

## 2017-07-01 DIAGNOSIS — R197 Diarrhea, unspecified: Secondary | ICD-10-CM

## 2017-07-01 DIAGNOSIS — Z8041 Family history of malignant neoplasm of ovary: Secondary | ICD-10-CM | POA: Insufficient documentation

## 2017-07-01 DIAGNOSIS — Z170 Estrogen receptor positive status [ER+]: Secondary | ICD-10-CM | POA: Diagnosis not present

## 2017-07-01 DIAGNOSIS — G629 Polyneuropathy, unspecified: Secondary | ICD-10-CM | POA: Insufficient documentation

## 2017-07-01 DIAGNOSIS — K219 Gastro-esophageal reflux disease without esophagitis: Secondary | ICD-10-CM

## 2017-07-01 DIAGNOSIS — R0602 Shortness of breath: Secondary | ICD-10-CM

## 2017-07-01 DIAGNOSIS — R232 Flushing: Secondary | ICD-10-CM

## 2017-07-01 DIAGNOSIS — T451X5A Adverse effect of antineoplastic and immunosuppressive drugs, initial encounter: Secondary | ICD-10-CM

## 2017-07-01 DIAGNOSIS — F419 Anxiety disorder, unspecified: Secondary | ICD-10-CM | POA: Insufficient documentation

## 2017-07-01 DIAGNOSIS — D649 Anemia, unspecified: Secondary | ICD-10-CM | POA: Diagnosis not present

## 2017-07-01 DIAGNOSIS — K5900 Constipation, unspecified: Secondary | ICD-10-CM | POA: Insufficient documentation

## 2017-07-01 DIAGNOSIS — Z79899 Other long term (current) drug therapy: Secondary | ICD-10-CM | POA: Diagnosis not present

## 2017-07-01 DIAGNOSIS — Z9221 Personal history of antineoplastic chemotherapy: Secondary | ICD-10-CM | POA: Diagnosis not present

## 2017-07-01 DIAGNOSIS — Z95828 Presence of other vascular implants and grafts: Secondary | ICD-10-CM

## 2017-07-01 DIAGNOSIS — C50111 Malignant neoplasm of central portion of right female breast: Secondary | ICD-10-CM | POA: Diagnosis not present

## 2017-07-01 LAB — COMPREHENSIVE METABOLIC PANEL
ALT: 21 U/L (ref 14–54)
AST: 23 U/L (ref 15–41)
Albumin: 3.8 g/dL (ref 3.5–5.0)
Alkaline Phosphatase: 60 U/L (ref 38–126)
Anion gap: 10 (ref 5–15)
BUN: 12 mg/dL (ref 6–20)
CO2: 25 mmol/L (ref 22–32)
Calcium: 9.1 mg/dL (ref 8.9–10.3)
Chloride: 101 mmol/L (ref 101–111)
Creatinine, Ser: 0.6 mg/dL (ref 0.44–1.00)
GFR calc Af Amer: >60 mL/min (ref >60)
GFR calc non Af Amer: >60 mL/min (ref >60)
Glucose, Bld: 89 mg/dL (ref 65–99)
Potassium: 3.6 mmol/L (ref 3.5–5.1)
Sodium: 136 mmol/L (ref 135–145)
Total Bilirubin: 0.4 mg/dL (ref 0.3–1.2)
Total Protein: 6.8 g/dL (ref 6.5–8.1)

## 2017-07-01 LAB — CBC WITH DIFFERENTIAL/PLATELET
Basophils Absolute: 0 10*3/uL (ref 0–0.1)
Basophils Relative: 0 %
Eosinophils Absolute: 0.1 10*3/uL (ref 0–0.7)
Eosinophils Relative: 1 %
HCT: 34.1 % — ABNORMAL LOW (ref 35.0–47.0)
Hemoglobin: 11.5 g/dL — ABNORMAL LOW (ref 12.0–16.0)
Lymphocytes Relative: 22 %
Lymphs Abs: 1.6 10*3/uL (ref 1.0–3.6)
MCH: 30.5 pg (ref 26.0–34.0)
MCHC: 33.8 g/dL (ref 32.0–36.0)
MCV: 90.1 fL (ref 80.0–100.0)
Monocytes Absolute: 0.4 10*3/uL (ref 0.2–0.9)
Monocytes Relative: 5 %
Neutro Abs: 5.5 10*3/uL (ref 1.4–6.5)
Neutrophils Relative %: 72 %
Platelets: 349 10*3/uL (ref 150–440)
RBC: 3.79 MIL/uL — ABNORMAL LOW (ref 3.80–5.20)
RDW: 13 % (ref 11.5–14.5)
WBC: 7.7 10*3/uL (ref 3.6–11.0)

## 2017-07-01 MED ORDER — HEPARIN SOD (PORK) LOCK FLUSH 100 UNIT/ML IV SOLN
500.0000 [IU] | Freq: Once | INTRAVENOUS | Status: AC
  Administered 2017-07-01: 500 [IU] via INTRAVENOUS

## 2017-07-01 MED ORDER — SODIUM CHLORIDE 0.9% FLUSH
10.0000 mL | INTRAVENOUS | Status: DC | PRN
  Administered 2017-07-01: 10 mL via INTRAVENOUS
  Filled 2017-07-01: qty 10

## 2017-07-01 NOTE — Progress Notes (Signed)
[place] [facility] day:  07/01/17   Chief Complaint: JOETTE SCHMOKER is a 51 y.o. female with clinical stage IIB Her2/neu+ right breast cancer on tamoxifen who is seen for 4 month assessment.  HPI:  The patient was last seen in the medical oncology on 04/25/2017.  At that time, she was seen for a sick call visit.  She was having memory issues.  She had trouble with multi-tasking.  She appeared to have chemotherapy induced cognitive changes.  Head MRI was offered.  She spoke with clinical trails.  Symptomatically, she is doing well. Patient reports that she continues to have "some" problems with her memory; "not bad though". Patient spoke with clinical trials for possible inclusion in the REMEMBER study. Patient deferred inclusion initially and asked that they follow up with her later. Clinical trials to speak with patient again in November.   Patient continues to experience vasomotor symptoms; Effexor is helping. Patient has been sleeping better over the last few months. Headaches have markedly improved. Patient denies fevers, sweats, and weight loss. Patient with recent infection (1.5-2 weeks ago) to her RIGHT breast. She developed a fever. She was seen by her plastic surgeon and prescribed a course of Bactrim. Patient was seen in follow up on 06/26/2017. She has to go back on 07/02/2017 and they are going to "cut away some breast tissue and re-suture it".  Patient continues on Tamoxifen with no perceived side effects.    Past Medical History:  Diagnosis Date  . Anemia    H/O  . Anxiety   . Breast cancer ([place])    breast  . Chronic kidney disease 12-2015   PYLONEPHRITIS  . Complication of anesthesia    HARD TO WAKE UP AFTER TONSILLECTOMY AS A CHILD BUT NO PROBLEMS SINCE  . Complication of anesthesia    PT STATES THAT BACK IN OR DURING PORT PLACMENT IN 2016 THAT SHE BECAME VEERY FLUSHED, LIGHT HEADED AND CRNA SAID ANTIBIOTIC MAY BE GOING IN IN TO FAST-RATE  DECREASED AND PTS SYMPTOMS RESOLVED IMMEDIATELY  . Depression   . GERD (gastroesophageal reflux disease)   . Shortness of breath dyspnea     Past Surgical History:  Procedure Laterality Date  . ABDOMINAL HYSTERECTOMY  2001  . BACK SURGERY     FOR SCOLIOSIS-HERRINGTON RODS IN PLACE  . BREAST BIOPSY Bilateral    08/17/2015   . PORTACATH PLACEMENT N/A 08/30/2015   Procedure: INSERTION PORT-A-CATH;  Surgeon: Leonie Green, MD;  Location: [facility] ORS;  Service: General;  Laterality: N/A;  . SENTINEL NODE BIOPSY Bilateral 03/08/2016   Procedure: SENTINEL NODE BIOPSY;  Surgeon: Leonie Green, MD;  Location: [facility] ORS;  Service: General;  Laterality: Bilateral;  . SIMPLE MASTECTOMY WITH AXILLARY SENTINEL NODE BIOPSY Bilateral 03/08/2016   Procedure: SIMPLE MASTECTOMY;  Surgeon: Leonie Green, MD;  Location: [facility] ORS;  Service: General;  Laterality: Bilateral;  . TONSILLECTOMY      Family History  Problem Relation Age of Onset  . Breast cancer Maternal Aunt 64  . Cancer Father   . Cancer Sister   . Cancer Maternal Grandfather     Social History:  reports that she quit smoking about 17 years ago. Her smoking use included Cigarettes. She has a 10.00 pack-year smoking history. She has never used smokeless tobacco. She reports that she drinks alcohol. She reports that she does not use drugs.  Her husband states that she drinks 2-4 bottles of wine/week.  She returned  to full-time work on 10/24/2016.  Her daughter is getting married soon. The patient is alone today.  Allergies:  Allergies  Allergen Reactions  . Ceftin [Cefuroxime Axetil] Rash    Current Medications: Current Outpatient Prescriptions  Medication Sig Dispense Refill  . acetaminophen (TYLENOL) 500 MG tablet Take 1,000 mg by mouth every 6 (six) hours as needed for mild pain.     Marland Kitchen ALPRAZolam (XANAX) 0.25 MG tablet Take 0.0625 mg by mouth 3 (three) times daily as needed for anxiety.    . diphenhydrAMINE (SOMINEX) 25  MG tablet Take 25 mg by mouth at bedtime. Reported on 01/12/2016    . lidocaine-prilocaine (EMLA) cream Apply 1 application topically as needed. Apply at least 1-2 hours before the port is to be used. 30 g 1  . magic mouthwash SOLN Take 5 mLs by mouth 3 (three) times daily as needed for mouth pain. 150 mL 5  . Multiple Vitamin (MULTIVITAMIN) capsule Take 1 capsule by mouth daily.    . tamoxifen (NOLVADEX) 20 MG tablet TAKE 1 TABLET (20 MG TOTAL) BY MOUTH DAILY. 30 tablet 5  . venlafaxine XR (EFFEXOR-XR) 37.5 MG 24 hr capsule Take 1 capsule (37.5 mg total) by mouth daily with breakfast. 30 capsule 3   No current facility-administered medications for this visit.     Review of Systems:  GENERAL:  Feels "better". No fevers or sweats.  Weight up 2 pounds. PERFORMANCE STATUS (ECOG):  0 HEENT:  No visual changes, runny nose, sore throat, mouth sores or tenderness. Lungs: No shortness of breath or cough.  No pleuritic chest pain.  No hemoptysis. Cardiac:  No chest pain, palpitations, orthopnea, or PND. GI:  No vomiting, diarrhea, melena or hematochezia.  Baseline constipation. GU:  No urgency, frequency, dysuria or hematuria.  Musculoskeletal:  No back pain.  No joint pain.  No muscle tenderness. Extremities:  No pain or swelling. Skin:  Interval infection (see HPI).  No rashes or skin changes. Neuro:  Rare headache.  Memory improved.  No numbness, weakness, balance or coordination issues. Endocrine:  Mild hot flashes.  No diabetes, thyroid issues, or night sweats. Psych:  Sleeping well.  No mood changes.  Trouble getting up in the morning. Pain:  No focal pain. Review of systems:  All other systems reviewed and found to be negative.  Physical Exam: Blood pressure (!) 141/91, pulse 92, temperature 98.2 F (36.8 C), temperature source Tympanic, resp. rate 18, weight 112 lb 3 oz (50.9 kg). O2 sats 94% with ambulation. GENERAL:  Thin tan, well appearing, woman sitting comfortably in the exam room  in no acute distress.  MENTAL STATUS:  Alert and oriented to person, place and time. HEAD:  Curly short blonde hair.  Normocephalic, atraumatic, face symmetric (thin), no Cushingoid features. EYES:  Blue eyes.  Pupils equal round and reactive to light and accomodation.  No conjunctivitis or scleral icterus. ENT:  Oropharynx clear without lesion.  Tongue normal. Mucous membranes moist.  RESPIRATORY:  Clear to auscultation without rales, wheezes or rhonchi. CARDIOVASCULAR:  Regular rate and rhythm without murmur, rub or gallop. No JVD. BREAST:  s/p bilateral mastectomy with reconstruction. Right breast with a small localized area of pinkness.  No increased warmth or tenderness.  No infection. ABDOMEN:  Soft, non-tender, with active bowel sounds, and no hepatosplenomegaly.  No masses. SKIN:  Tan.  No rashes, ulcers or lesions. EXTREMITIES: No edema, no skin discoloration or tenderness.  No palpable cords. LYMPH NODES: No palpable cervical, supraclavicular, axillary or inguinal  adenopathy  NEUROLOGICAL:  Appropriate. PSYCH:  Appropriate.    Infusion on 07/01/2017  Component Date Value Ref Range Status  . WBC 07/01/2017 7.7  3.6 - 11.0 K/uL Final  . RBC 07/01/2017 3.79* 3.80 - 5.20 MIL/uL Final  . Hemoglobin 07/01/2017 11.5* 12.0 - 16.0 g/dL Final  . HCT 07/01/2017 34.1* 35.0 - 47.0 % Final  . MCV 07/01/2017 90.1  80.0 - 100.0 fL Final  . MCH 07/01/2017 30.5  26.0 - 34.0 pg Final  . MCHC 07/01/2017 33.8  32.0 - 36.0 g/dL Final  . RDW 07/01/2017 13.0  11.5 - 14.5 % Final  . Platelets 07/01/2017 349  150 - 440 K/uL Final  . Neutrophils Relative % 07/01/2017 72  % Final  . Neutro Abs 07/01/2017 5.5  1.4 - 6.5 K/uL Final  . Lymphocytes Relative 07/01/2017 22  % Final  . Lymphs Abs 07/01/2017 1.6  1.0 - 3.6 K/uL Final  . Monocytes Relative 07/01/2017 5  % Final  . Monocytes Absolute 07/01/2017 0.4  0.2 - 0.9 K/uL Final  . Eosinophils Relative 07/01/2017 1  % Final  . Eosinophils Absolute  07/01/2017 0.1  0 - 0.7 K/uL Final  . Basophils Relative 07/01/2017 0  % Final  . Basophils Absolute 07/01/2017 0.0  0 - 0.1 K/uL Final  . Sodium 07/01/2017 136  135 - 145 mmol/L Final  . Potassium 07/01/2017 3.6  3.5 - 5.1 mmol/L Final  . Chloride 07/01/2017 101  101 - 111 mmol/L Final  . CO2 07/01/2017 25  22 - 32 mmol/L Final  . Glucose, Bld 07/01/2017 89  65 - 99 mg/dL Final  . BUN 07/01/2017 12  6 - 20 mg/dL Final  . Creatinine, Ser 07/01/2017 0.60  0.44 - 1.00 mg/dL Final  . Calcium 07/01/2017 9.1  8.9 - 10.3 mg/dL Final  . Total Protein 07/01/2017 6.8  6.5 - 8.1 g/dL Final  . Albumin 07/01/2017 3.8  3.5 - 5.0 g/dL Final  . AST 07/01/2017 23  15 - 41 U/L Final  . ALT 07/01/2017 21  14 - 54 U/L Final  . Alkaline Phosphatase 07/01/2017 60  38 - 126 U/L Final  . Total Bilirubin 07/01/2017 0.4  0.3 - 1.2 mg/dL Final  . GFR calc non Af Amer 07/01/2017 >60  >60 mL/min Final  . GFR calc Af Amer 07/01/2017 >60  >60 mL/min Final   Comment: (NOTE) The eGFR has been calculated using the CKD EPI equation. This calculation has not been validated in all clinical situations. eGFR's persistently <60 mL/min signify possible Chronic Kidney Disease.   . Anion gap 07/01/2017 10  5 - 15 Final    Assessment:  ROSINA CRESSLER is a 51 y.o. female with clinical stage T2N1 (stage IIB) Her2/neu + right breast cancer s/p neoadjuvant chemotherapy and bilateral mastectomy.  She completed a year of adjuvant Herceptin.  She presented with a minimally painful right breast mass with nipple inversion.  Bilateral mammogram and ultrasound on 08/12/2015 revealed a 4.1 cm right superior breast mass containing pleomorphic microcalcifications. There were several satellite lesions (1.3 cm, 0.9 cm, 0.6 cm) plus a 1.3 cm mass versus complicated cyst and a 0.8 cm cyst.  There was one 5 mm right axillary node with irregular thickened cortex.   Core needle biopsy on 08/17/2015 of the right breast mass revealed a grade 3  invasive mammary carcinoma. Right axillary biopsy confirmed metastatic mammary carcinoma. Tumor was ER positive (1-10%), PR positive (11-50%) and HER-2/neu 3+  PET scan  on 08/26/2015 revealed hypermetabolic right breast mass (SUV 11.3) with a hypermetabolic right axillary lymph node and a subtle focus of very faint hypermetabolic activity laterally in the right breast. There was faint hypermetabolic activity at the biopsy site of the left breast (? biopsy related).    CA27.29 has been followed: 19.6 on 08/22/2015, 24.7 on 01/10/2016, 15.5 on 03/23/2016, 16.3 on 12/06/2016, 17.4 on 03/05/2017, 11.7 on 04/25/2017, and 15.4 on 07/01/2017.  She received 4 cycles of AC (09/13/2015 - 11/08/2015) with Neulasta support.  She received 4 cycles of Herceptin and Perjeta (11/29/2015 - 01/31/2016) and 12 weeks of Taxol (11/29/2015 - 02/14/2016).  She tolerated her chemotherapy well.  She developed a grade I neuropathy.  She completed a year of adjuvant Herceptin on 11/15/2016.  She began tamoxifen on 07/05/2016.    She was on neratinib from 12/06/2016 - 01/01/2017.  She had significant diarrhea affecting quality of life.  She has ssues with constipation (baseline on Miralax).  She is on Effexor for vasomotor symptoms.  Bilateral mastectomy on 03/08/2016 revealed no residual disease in the right breast.  Five lymph nodes were negative for malignancy.  Pathologic stage was ypT0 ypN0.  Left breast revealed benign intraductal papillomas and cysts adjacent to the biopsy site.  There was no atypia or malignancy.  She is considering reconstruction in 09/2016 or 10/2016.  She received 50.4 Gy right chest wall radiation from 05/07/2016 - 06/25/2016.  She underwent reconstructive surgery on 10/10/2016.  She completed reconstruction on  01/11/2017.  She is s/p partial hysterectomy (ovaries remain) in 2001.  She was premenopausal ([place] 5 and estradiol 271.9) on 08/22/2015.  Echo on 08/26/2015 revealed an ejection fraction of  55-60%.  Echo on 11/25/2015 revealed an EF of 60-65%.  Echo on 02/23/2016 and 05/25/2016 revealed an EF of 55-65%.  Echo on 08/31/2016 revealed an EF of 50-55%.  Echo on 11/29/2016 revealed an EF of 60-65%.  She has a family history of breast and ovarian cancer.  My Risk genetic testing revealed no mutation on 08/22/2015.  She developed a skin infection overlying her right breast implant.  She is s/p a course of Septra.  Symptomatically, she note less fatigue and memory issues.  She has mild vasomotor symptoms.  Exam reveals small pink area overlying right breast implant without evidence of infection.  Plan: 1.  Labs today:  CBC with diff, CMP, CA27.29. 2.  Discuss clinical trials. Research to follow up with patient in November regarding REMEMBER study. Note sent to research to remind them.  3.  Follow up with plastic surgery as already scheduled for continued care of RIGHT breast.  4.  Continue tamoxifen. 5.  RTC in 4 months for MD assessment, labs (CBC with diff, CMP, CA27.29)   Honor Loh, NP  07/01/2017, 4:01 PM   I saw and evaluated the patient, participating in the key portions of the service and reviewing pertinent diagnostic studies and records.  I reviewed the nurse practitioner's note and agree with the findings and the plan.  The assessment and plan were discussed with the patient.  A few questions were asked by the patient and answered.   Lequita Asal, MD 07/01/2017, 4:01 PM

## 2017-07-01 NOTE — Progress Notes (Signed)
Patient states she just finished a round of Bactrim for infection in her right breast.  Patient is having surgical procedure on that breast tomorrow.  She states the skin is thin so they are going to resuture and make a better seal in the incision.     Also requesting refill for Effexor.

## 2017-07-02 LAB — CANCER ANTIGEN 27.29: CA 27.29: 15.4 U/mL (ref 0.0–38.6)

## 2017-07-08 ENCOUNTER — Other Ambulatory Visit: Payer: Self-pay

## 2017-07-08 ENCOUNTER — Ambulatory Visit: Payer: Self-pay | Admitting: Hematology and Oncology

## 2017-07-10 DIAGNOSIS — Z853 Personal history of malignant neoplasm of breast: Secondary | ICD-10-CM | POA: Diagnosis not present

## 2017-07-12 MED ORDER — VENLAFAXINE HCL ER 37.5 MG PO CP24: 37.5000 mg | ORAL_CAPSULE | Freq: Every day | ORAL | 3 refills | Status: DC

## 2017-07-18 ENCOUNTER — Other Ambulatory Visit: Payer: Self-pay

## 2017-07-18 MED ORDER — FLUCONAZOLE 150 MG PO TABS: ORAL_TABLET | ORAL | 0 refills | Status: DC

## 2017-07-29 ENCOUNTER — Telehealth: Payer: Self-pay | Admitting: Urgent Care

## 2017-07-29 ENCOUNTER — Telehealth: Payer: Self-pay | Admitting: *Deleted

## 2017-07-29 NOTE — Telephone Encounter (Signed)
Asking if she can have her port removed. It is bothering her and Dr Tamala Julian said it was ok, but would be willingto remove it if wanted. Please send order if in agreement to remove it

## 2017-07-29 NOTE — Telephone Encounter (Signed)
Returned a call from patient to discuss port removal. Patient unable to be reached via telephone at this time. I discussed patient's request with Dr. Mike Gip. Patient called in reporting that the port was bothering her. Per Dr. Mike Gip, it is ok to proceed with having the port removed at this time. I left this information on patient's voicemail. I will also send over a message to Dr. Tamala Julian to make him aware that patient is ok to have port removed at any point.

## 2017-07-30 NOTE — Telephone Encounter (Signed)
Faxed order to remove port to dr Tamala Julian office

## 2017-07-30 NOTE — Telephone Encounter (Signed)
  OK to remove.  M

## 2017-07-31 DIAGNOSIS — Z853 Personal history of malignant neoplasm of breast: Secondary | ICD-10-CM | POA: Diagnosis not present

## 2017-08-05 ENCOUNTER — Other Ambulatory Visit: Payer: Self-pay

## 2017-08-15 ENCOUNTER — Telehealth: Payer: Self-pay | Admitting: *Deleted

## 2017-08-15 NOTE — Telephone Encounter (Signed)
I contacted Emma Atkinson today about her possible participation in the REMEMBER study.  Patient was screened on 07/01/17 and met initial criteria for participation in the study.  At the time patient did not want to commit to participation because she had a cruise coming up in October and wanted to me to re-contact her in November.  After speaking with the patient today, she said that she was too busy at the present time and was having surgery to remove her port in December as well as the upcoming holiday activities.  She has a follow-up appointment with Dr. Mike Gip on 10/28/17.  I asked if she wanted to be approached again at that time about participation in the study.  [name] was agreeable to that.  Will plan to meet with patient in the clinic on 10/28/17. Raynelle Dick, RN BSN "08/15/2017 10:37 AM"

## 2017-09-04 DIAGNOSIS — N650 Deformity of reconstructed breast: Secondary | ICD-10-CM | POA: Diagnosis not present

## 2017-09-04 DIAGNOSIS — Z45812 Encounter for adjustment or removal of left breast implant: Secondary | ICD-10-CM | POA: Diagnosis not present

## 2017-09-04 DIAGNOSIS — T8579XA Infection and inflammatory reaction due to other internal prosthetic devices, implants and grafts, initial encounter: Secondary | ICD-10-CM | POA: Diagnosis not present

## 2017-09-04 DIAGNOSIS — C50919 Malignant neoplasm of unspecified site of unspecified female breast: Secondary | ICD-10-CM | POA: Diagnosis not present

## 2017-10-21 DIAGNOSIS — Z853 Personal history of malignant neoplasm of breast: Secondary | ICD-10-CM | POA: Diagnosis not present

## 2017-10-22 ENCOUNTER — Other Ambulatory Visit: Payer: Self-pay | Admitting: Gastroenterology

## 2017-10-22 NOTE — Telephone Encounter (Signed)
Change 2% to 5% ok

## 2017-10-28 ENCOUNTER — Inpatient Hospital Stay: Payer: 59

## 2017-10-28 ENCOUNTER — Inpatient Hospital Stay: Payer: 59 | Attending: Hematology and Oncology | Admitting: Hematology and Oncology

## 2017-10-28 ENCOUNTER — Other Ambulatory Visit: Payer: Self-pay

## 2017-10-28 ENCOUNTER — Encounter: Payer: Self-pay | Admitting: Hematology and Oncology

## 2017-10-28 VITALS — BP 164/89 | HR 99 | Temp 96.8°F | Wt 115.5 lb

## 2017-10-28 DIAGNOSIS — N951 Menopausal and female climacteric states: Secondary | ICD-10-CM

## 2017-10-28 DIAGNOSIS — Z79810 Long term (current) use of selective estrogen receptor modulators (SERMs): Secondary | ICD-10-CM

## 2017-10-28 DIAGNOSIS — Z170 Estrogen receptor positive status [ER+]: Secondary | ICD-10-CM | POA: Diagnosis not present

## 2017-10-28 DIAGNOSIS — C50111 Malignant neoplasm of central portion of right female breast: Secondary | ICD-10-CM

## 2017-10-28 DIAGNOSIS — Z7289 Other problems related to lifestyle: Secondary | ICD-10-CM | POA: Diagnosis not present

## 2017-10-28 DIAGNOSIS — Z853 Personal history of malignant neoplasm of breast: Secondary | ICD-10-CM

## 2017-10-28 LAB — COMPREHENSIVE METABOLIC PANEL
ALT: 26 U/L (ref 14–54)
AST: 30 U/L (ref 15–41)
Albumin: 4.2 g/dL (ref 3.5–5.0)
Alkaline Phosphatase: 62 U/L (ref 38–126)
Anion gap: 7 (ref 5–15)
BUN: 11 mg/dL (ref 6–20)
CO2: 29 mmol/L (ref 22–32)
Calcium: 9.1 mg/dL (ref 8.9–10.3)
Chloride: 104 mmol/L (ref 101–111)
Creatinine, Ser: 0.65 mg/dL (ref 0.44–1.00)
GFR calc Af Amer: >60 mL/min (ref >60)
GFR calc non Af Amer: >60 mL/min (ref >60)
Glucose, Bld: 134 mg/dL — ABNORMAL HIGH (ref 65–99)
Potassium: 4.1 mmol/L (ref 3.5–5.1)
Sodium: 140 mmol/L (ref 135–145)
Total Bilirubin: 0.6 mg/dL (ref 0.3–1.2)
Total Protein: 6.8 g/dL (ref 6.5–8.1)

## 2017-10-28 LAB — CBC WITH DIFFERENTIAL/PLATELET
Basophils Absolute: 0 10*3/uL (ref 0–0.1)
Basophils Relative: 0 %
Eosinophils Absolute: 0 10*3/uL (ref 0–0.7)
Eosinophils Relative: 1 %
HCT: 37.3 % (ref 35.0–47.0)
Hemoglobin: 12.4 g/dL (ref 12.0–16.0)
Lymphocytes Relative: 20 %
Lymphs Abs: 1.3 10*3/uL (ref 1.0–3.6)
MCH: 30.9 pg (ref 26.0–34.0)
MCHC: 33.3 g/dL (ref 32.0–36.0)
MCV: 92.8 fL (ref 80.0–100.0)
Monocytes Absolute: 0.3 10*3/uL (ref 0.2–0.9)
Monocytes Relative: 5 %
Neutro Abs: 4.7 10*3/uL (ref 1.4–6.5)
Neutrophils Relative %: 74 %
Platelets: 297 10*3/uL (ref 150–440)
RBC: 4.03 MIL/uL (ref 3.80–5.20)
RDW: 13.7 % (ref 11.5–14.5)
WBC: 6.4 10*3/uL (ref 3.6–11.0)

## 2017-10-28 MED ORDER — VENLAFAXINE HCL ER 75 MG PO CP24: 75.0000 mg | ORAL_CAPSULE | Freq: Every day | ORAL | 2 refills | Status: DC

## 2017-10-28 NOTE — Progress Notes (Signed)
Emma Atkinson:  10/28/17   Chief Complaint: Emma Atkinson is a 52 y.o. female with clinical stage IIB Her2/neu+ right breast cancer on tamoxifen who is seen for 4 month assessment.  HPI:  The patient was last seen in the medical oncology on 07/01/2017.  At that time, she noted less fatigue and memory issues.  She had mild vasomotor symptoms.  Exam revealed a small pink area overlying right breast implant without evidence of infection.  CA27.29 was normal.  In the interim, patient has had her port-a-cath has been removed. Patient also has had to have breast implants removed due to infection. Patient follows up with surgeon in March to discuss options to have implants replaced. Patient notes that she may have to have tissue expanders again, which she is not thrilled about.   Patient's vasomotor symptoms have increased over the last 2 months. Patient has drenching sweats. She notes that she has been more emotional as of late. She feels that she crying more. She is on Effexor 37.5 mg, which is no longer effective.   Patient is eating ok. Her ideal body weight is 105 pounds. She is currently at 115 pounds, which is up 3 pounds from her last visit. Patient has recently started exercising again. She is exercising 3 days a week for 30 minutes at a time.   Patient states, "Generally, I feel ok. I am just tired. Work is real busy". Patient is stressed at work. She notes that she does not get a lunch break. Patient continues to struggle with focus and concentration.  Patient has spoken with clinical research about inclusion in the REMEMBER study, however she notes that she is "dealing with too much right now to participate".  Patient is sleeping "better". She is not having to take sleep aids like she has in the past.   Patient continues to drink "4 bottles of wine per week".  Husband notes that she is drinking "three 9 oz glasses of wine" with dinner every night.  Patient is drinking way more than she was drinking a year ago.  Patient is a former beer drinker. She is exclusively drinking wine at this point. Patient denies using alcohol to help with her sleep. Patient denies that alcohol is causing adverse effects in her life. Patient and husband are disagreeing a lot on things pertaining to patient's alcohol consumption, which is causing marital discord.    Past Medical History:  Diagnosis Date  . Anemia    H/O  . Anxiety   . Breast cancer ([place])    breast  . Chronic kidney disease 12-2015   PYLONEPHRITIS  . Complication of anesthesia    HARD TO WAKE UP AFTER TONSILLECTOMY AS A CHILD BUT NO PROBLEMS SINCE  . Complication of anesthesia    PT STATES THAT BACK IN OR DURING PORT PLACMENT IN 2016 THAT SHE BECAME VEERY FLUSHED, LIGHT HEADED AND CRNA SAID ANTIBIOTIC MAY BE GOING IN IN TO FAST-RATE DECREASED AND PTS SYMPTOMS RESOLVED IMMEDIATELY  . Depression   . GERD (gastroesophageal reflux disease)   . Shortness of breath dyspnea     Past Surgical History:  Procedure Laterality Date  . ABDOMINAL HYSTERECTOMY  2001  . BACK SURGERY     FOR SCOLIOSIS-HERRINGTON RODS IN PLACE  . BREAST BIOPSY Bilateral    08/17/2015   . PORTACATH PLACEMENT N/A 08/30/2015   Procedure: INSERTION PORT-A-CATH;  Surgeon: Leonie Green, MD;  Location: [facility] ORS;  Service:  General;  Laterality: N/A;  . SENTINEL NODE BIOPSY Bilateral 03/08/2016   Procedure: SENTINEL NODE BIOPSY;  Surgeon: Leonie Green, MD;  Location: [facility] ORS;  Service: General;  Laterality: Bilateral;  . SIMPLE MASTECTOMY WITH AXILLARY SENTINEL NODE BIOPSY Bilateral 03/08/2016   Procedure: SIMPLE MASTECTOMY;  Surgeon: Leonie Green, MD;  Location: [facility] ORS;  Service: General;  Laterality: Bilateral;  . TONSILLECTOMY      Family History  Problem Relation Age of Onset  . Breast cancer Maternal Aunt 64  . Cancer Father   . Cancer Sister   . Cancer Maternal Grandfather     Social History:   reports that she quit smoking about 18 years ago. Her smoking use included cigarettes. She has a 10.00 pack-year smoking history. she has never used smokeless tobacco. She reports that she drinks alcohol. She reports that she does not use drugs.  Her husband states that she drinks 2-4 bottles of wine/week.  She returned to full-time work on 10/24/2016.  Her daughter was married in 01/2017.  She works out on the treadmill 3 days/week.  The patient is accompanied by her husband today.  Allergies:  Allergies  Allergen Reactions  . Ceftin [Cefuroxime Axetil] Rash    Current Medications: Current Outpatient Medications  Medication Sig Dispense Refill  . ALPRAZolam (XANAX) 0.25 MG tablet Take 0.0625 mg by mouth 3 (three) times daily as needed for anxiety.    . lidocaine-prilocaine (EMLA) cream Apply 1 application topically as needed. Apply at least 1-2 hours before the port is to be used. 30 g 1  . magic mouthwash SOLN Take 5 mLs by mouth 3 (three) times daily as needed for mouth pain. 150 mL 5  . Multiple Vitamin (MULTIVITAMIN) capsule Take 1 capsule by mouth daily.    . tamoxifen (NOLVADEX) 20 MG tablet TAKE 1 TABLET (20 MG TOTAL) BY MOUTH DAILY. 30 tablet 5   No current facility-administered medications for this visit.     Review of Systems:  GENERAL:  Feels "ok, just tired". No fevers.  Weight up 3 pounds.  Patient's ideal weight is 105 pounds. PERFORMANCE STATUS (ECOG):  0 HEENT:  No visual changes, runny nose, sore throat, mouth sores or tenderness. Lungs: No shortness of breath or cough.  No pleuritic chest pain.  No hemoptysis. Cardiac:  No chest pain, palpitations, orthopnea, or PND. GI:  No vomiting, diarrhea, melena or hematochezia.  Baseline constipation. GU:  No urgency, frequency, dysuria or hematuria.  Musculoskeletal:  No back pain.  No joint pain.  No muscle tenderness. Extremities:  No pain or swelling. Skin:  No rashes or skin changes. Neuro:  Rare headache.  Memory  improved.  No numbness, weakness, balance or coordination issues. Endocrine:  Mild hot flashes.  No diabetes, thyroid issues.  Sweats. Psych:  Sleeping well.  No mood changes.  Trouble getting up in the morning. Pain:  No focal pain. Review of systems:  All other systems reviewed and found to be negative.  Physical Exam: Blood pressure (!) 164/89, pulse 99, temperature (!) 96.8 F (36 C), temperature source Tympanic, weight 115 lb 8 oz (52.4 kg). O2 sats 94% with ambulation. GENERAL:  Thin tan, well appearing, woman sitting comfortably in the exam room in no acute distress.  MENTAL STATUS:  Alert and oriented to person, place and time. HEAD:  Curly short blonde hair.  Normocephalic, atraumatic, face symmetric (thin), no Cushingoid features. EYES:  Blue eyes.  Pupils equal round and reactive to light and accomodation.  No conjunctivitis or scleral icterus. ENT:  Oropharynx clear without lesion.  Tongue normal. Mucous membranes moist.  RESPIRATORY:  Clear to auscultation without rales, wheezes or rhonchi. CARDIOVASCULAR:  Regular rate and rhythm without murmur, rub or gallop. No JVD. BREAST:  s/p bilateral mastectomies with implant removal. ABDOMEN:  Soft, non-tender, with active bowel sounds, and no hepatosplenomegaly.  No masses. SKIN:  Tan.  No rashes, ulcers or lesions. EXTREMITIES: No edema, no skin discoloration or tenderness.  No palpable cords. LYMPH NODES: No palpable cervical, supraclavicular, axillary or inguinal adenopathy  NEUROLOGICAL:  Appropriate. PSYCH:  Appropriate.    Appointment on 10/28/2017  Component Date Value Ref Range Status  . Sodium 10/28/2017 140  135 - 145 mmol/L Final  . Potassium 10/28/2017 4.1  3.5 - 5.1 mmol/L Final  . Chloride 10/28/2017 104  101 - 111 mmol/L Final  . CO2 10/28/2017 29  22 - 32 mmol/L Final  . Glucose, Bld 10/28/2017 134* 65 - 99 mg/dL Final  . BUN 10/28/2017 11  6 - 20 mg/dL Final  . Creatinine, Ser 10/28/2017 0.65  0.44 - 1.00 mg/dL  Final  . Calcium 10/28/2017 9.1  8.9 - 10.3 mg/dL Final  . Total Protein 10/28/2017 6.8  6.5 - 8.1 g/dL Final  . Albumin 10/28/2017 4.2  3.5 - 5.0 g/dL Final  . AST 10/28/2017 30  15 - 41 U/L Final  . ALT 10/28/2017 26  14 - 54 U/L Final  . Alkaline Phosphatase 10/28/2017 62  38 - 126 U/L Final  . Total Bilirubin 10/28/2017 0.6  0.3 - 1.2 mg/dL Final  . GFR calc non Af Amer 10/28/2017 >60  >60 mL/min Final  . GFR calc Af Amer 10/28/2017 >60  >60 mL/min Final   Comment: (NOTE) The eGFR has been calculated using the CKD EPI equation. This calculation has not been validated in all clinical situations. eGFR's persistently <60 mL/min signify possible Chronic Kidney Disease.   Georgiann Hahn gap 10/28/2017 7  5 - 15 Final   Performed at [facility], [address] [place], [place] [address]  . WBC 10/28/2017 6.4  3.6 - 11.0 K/uL Final  . RBC 10/28/2017 4.03  3.80 - 5.20 MIL/uL Final  . Hemoglobin 10/28/2017 12.4  12.0 - 16.0 g/dL Final  . HCT 10/28/2017 37.3  35.0 - 47.0 % Final  . MCV 10/28/2017 92.8  80.0 - 100.0 fL Final  . MCH 10/28/2017 30.9  26.0 - 34.0 pg Final  . MCHC 10/28/2017 33.3  32.0 - 36.0 g/dL Final  . RDW 10/28/2017 13.7  11.5 - 14.5 % Final  . Platelets 10/28/2017 297  150 - 440 K/uL Final  . Neutrophils Relative % 10/28/2017 74  % Final  . Neutro Abs 10/28/2017 4.7  1.4 - 6.5 K/uL Final  . Lymphocytes Relative 10/28/2017 20  % Final  . Lymphs Abs 10/28/2017 1.3  1.0 - 3.6 K/uL Final  . Monocytes Relative 10/28/2017 5  % Final  . Monocytes Absolute 10/28/2017 0.3  0.2 - 0.9 K/uL Final  . Eosinophils Relative 10/28/2017 1  % Final  . Eosinophils Absolute 10/28/2017 0.0  0 - 0.7 K/uL Final  . Basophils Relative 10/28/2017 0  % Final  . Basophils Absolute 10/28/2017 0.0  0 - 0.1 K/uL Final   Performed at [facility], [address] [place], [place] [address]    Assessment:  JIZELLE CONKEY is a 52 y.o. female with clinical stage T2N1 (stage IIB) Her2/neu +  right breast  cancer s/p neoadjuvant chemotherapy and bilateral mastectomy.  She completed a year of adjuvant Herceptin.  She presented with a minimally painful right breast mass with nipple inversion.  Bilateral mammogram and ultrasound on 08/12/2015 revealed a 4.1 cm right superior breast mass containing pleomorphic microcalcifications. There were several satellite lesions (1.3 cm, 0.9 cm, 0.6 cm) plus a 1.3 cm mass versus complicated cyst and a 0.8 cm cyst.  There was one 5 mm right axillary node with irregular thickened cortex.   Core needle biopsy on 08/17/2015 of the right breast mass revealed a grade 3 invasive mammary carcinoma. Right axillary biopsy confirmed metastatic mammary carcinoma. Tumor was ER positive (1-10%), PR positive (11-50%) and HER-2/neu 3+  PET scan on 08/26/2015 revealed hypermetabolic right breast mass (SUV 11.3) with a hypermetabolic right axillary lymph node and a subtle focus of very faint hypermetabolic activity laterally in the right breast. There was faint hypermetabolic activity at the biopsy site of the left breast (? biopsy related).    CA27.29 has been followed: 19.6 on 08/22/2015, 24.7 on 01/10/2016, 15.5 on 03/23/2016, 16.3 on 12/06/2016, 17.4 on 03/05/2017, 11.7 on 04/25/2017, 15.4 on 07/01/2017, and 12.9 on 10/28/2017.  She received 4 cycles of AC (09/13/2015 - 11/08/2015) with Neulasta support.  She received 4 cycles of Herceptin and Perjeta (11/29/2015 - 01/31/2016) and 12 weeks of Taxol (11/29/2015 - 02/14/2016).  She tolerated her chemotherapy well.  She developed a grade I neuropathy.  She completed a year of adjuvant Herceptin on 11/15/2016.  She began tamoxifen on 07/05/2016.    She was on neratinib from 12/06/2016 - 01/01/2017.  She had significant diarrhea affecting quality of life.  She has ssues with constipation (baseline on Miralax).  She is on Effexor for vasomotor symptoms.  Bilateral mastectomy on 03/08/2016 revealed no residual disease in the  right breast.  Five lymph nodes were negative for malignancy.  Pathologic stage was ypT0 ypN0.  Left breast revealed benign intraductal papillomas and cysts adjacent to the biopsy site.  There was no atypia or malignancy.  She underwent reconstruction in 09/2016 or 10/2016 then implant removal in 08/2017 secondary to infection.  She received 50.4 Gy right chest wall radiation from 05/07/2016 - 06/25/2016.  She underwent reconstructive surgery on 10/10/2016.  She completed reconstruction on  01/11/2017.  She is s/p partial hysterectomy (ovaries remain) in 2001.  She was premenopausal (Trafalgar 5 and estradiol 271.9) on 08/22/2015.  Echo on 08/26/2015 revealed an ejection fraction of 55-60%.  Echo on 11/25/2015 revealed an EF of 60-65%.  Echo on 02/23/2016 and 05/25/2016 revealed an EF of 55-65%.  Echo on 08/31/2016 revealed an EF of 50-55%.  Echo on 11/29/2016 revealed an EF of 60-65%.  She has a family history of breast and ovarian cancer.  My Risk genetic testing revealed no mutation on 08/22/2015.  She developed a skin infection overlying her right breast implant.  She is s/p a course of Septra.  Symptomatically, she has fatigue and memory issues.  She has increasing vasomotor symptoms despite Effexor 37.5 mg daily.  Exam is unremarkable. Labs are stable.  Plan: 1.  Labs today:  CBC with diff, CMP, CA27.29, FSH, estradiol. 2.  Continue tamoxifen as previously prescribed.  3.  Discuss clinical trials. Research followed up with patient today, however she declined inclusion in the REMEMBER study at this time.  4.  Follow up with plastic surgery as already scheduled in 12/2017.  5.  Discussed increase in vasomotor symptoms. Will add FSH and estradiol levels to determine  menopause status. Previously pre-menopausal in 08/2015 when tested. Will increased Effexor XR to 75 mg daily for vasomotor symptoms.  6.  Discuss alcohol intake. Alcohol causing marital issues. Discussed counseling, either professionally  or through clergy, to develop healthy coping mechanisms to deal with stress other than alcohol.  7.  RTC in 4 months for MD assessment and labs (CBC with diff, CMP, CA27.29).   Honor Loh, NP  10/28/2017, 3:40 PM   I saw and evaluated the patient, participating in the key portions of the service and reviewing pertinent diagnostic studies and records.  I reviewed the nurse practitioner's note and agree with the findings and the plan.  The assessment and plan were discussed with the patient.  Several questions were asked by the patient and answered.   Honor Loh, NP 10/28/2017, 3:40 PM

## 2017-10-29 ENCOUNTER — Other Ambulatory Visit: Payer: Self-pay | Admitting: *Deleted

## 2017-10-29 ENCOUNTER — Encounter: Payer: Self-pay | Admitting: Urgent Care

## 2017-10-29 DIAGNOSIS — C50111 Malignant neoplasm of central portion of right female breast: Secondary | ICD-10-CM

## 2017-10-29 DIAGNOSIS — F419 Anxiety disorder, unspecified: Secondary | ICD-10-CM | POA: Diagnosis not present

## 2017-10-29 DIAGNOSIS — C50919 Malignant neoplasm of unspecified site of unspecified female breast: Secondary | ICD-10-CM | POA: Diagnosis not present

## 2017-10-29 DIAGNOSIS — Z0000 Encounter for general adult medical examination without abnormal findings: Secondary | ICD-10-CM | POA: Diagnosis not present

## 2017-10-29 DIAGNOSIS — Z170 Estrogen receptor positive status [ER+]: Principal | ICD-10-CM

## 2017-10-29 DIAGNOSIS — F5104 Psychophysiologic insomnia: Secondary | ICD-10-CM | POA: Diagnosis not present

## 2017-10-29 DIAGNOSIS — R7309 Other abnormal glucose: Secondary | ICD-10-CM | POA: Diagnosis not present

## 2017-10-29 DIAGNOSIS — C50911 Malignant neoplasm of unspecified site of right female breast: Secondary | ICD-10-CM | POA: Diagnosis not present

## 2017-10-29 LAB — CANCER ANTIGEN 27.29: CA 27.29: 12.9 U/mL (ref 0.0–38.6)

## 2017-10-29 LAB — FOLLICLE STIMULATING HORMONE: FSH: 42.4 m[IU]/mL

## 2017-10-29 LAB — ESTRADIOL

## 2017-11-15 ENCOUNTER — Encounter: Payer: Self-pay | Admitting: Hematology and Oncology

## 2017-12-02 ENCOUNTER — Encounter: Payer: Self-pay | Admitting: *Deleted

## 2017-12-03 ENCOUNTER — Encounter: Payer: Self-pay | Admitting: Hematology and Oncology

## 2017-12-03 ENCOUNTER — Other Ambulatory Visit: Payer: Self-pay | Admitting: Urgent Care

## 2017-12-03 MED ORDER — VENLAFAXINE HCL ER 37.5 MG PO CP24: 37.5000 mg | ORAL_CAPSULE | Freq: Every day | ORAL | 0 refills | Status: DC

## 2017-12-23 DIAGNOSIS — Z9013 Acquired absence of bilateral breasts and nipples: Secondary | ICD-10-CM | POA: Diagnosis not present

## 2017-12-24 ENCOUNTER — Other Ambulatory Visit: Payer: Self-pay

## 2017-12-24 MED ORDER — OSELTAMIVIR PHOSPHATE 75 MG PO CAPS: 75.0000 mg | ORAL_CAPSULE | Freq: Two times a day (BID) | ORAL | 0 refills | Status: DC

## 2017-12-25 ENCOUNTER — Ambulatory Visit: Payer: Self-pay | Admitting: Radiation Oncology

## 2017-12-25 ENCOUNTER — Other Ambulatory Visit: Payer: Self-pay | Admitting: Hematology and Oncology

## 2017-12-25 DIAGNOSIS — C50811 Malignant neoplasm of overlapping sites of right female breast: Secondary | ICD-10-CM

## 2017-12-25 DIAGNOSIS — Z170 Estrogen receptor positive status [ER+]: Principal | ICD-10-CM

## 2018-01-02 ENCOUNTER — Other Ambulatory Visit: Payer: Self-pay

## 2018-01-02 ENCOUNTER — Ambulatory Visit
Admission: RE | Admit: 2018-01-02 | Discharge: 2018-01-02 | Disposition: A | Discharge status: Home / Self Care | Payer: 59 | Source: Ambulatory Visit | Attending: Radiation Oncology | Admitting: Radiation Oncology

## 2018-01-02 VITALS — BP 143/92 | HR 93 | Temp 97.2°F | Resp 12 | Ht 60.0 in | Wt 117.4 lb

## 2018-01-02 DIAGNOSIS — Z9012 Acquired absence of left breast and nipple: Secondary | ICD-10-CM | POA: Diagnosis not present

## 2018-01-02 DIAGNOSIS — Z923 Personal history of irradiation: Secondary | ICD-10-CM | POA: Insufficient documentation

## 2018-01-02 DIAGNOSIS — Z170 Estrogen receptor positive status [ER+]: Secondary | ICD-10-CM | POA: Insufficient documentation

## 2018-01-02 DIAGNOSIS — C50111 Malignant neoplasm of central portion of right female breast: Secondary | ICD-10-CM | POA: Insufficient documentation

## 2018-01-02 NOTE — Progress Notes (Signed)
Radiation Oncology Follow up Note  Name: Emma Atkinson   Date:   01/02/2018 MRN:  [id number] DOB: August 10, 1966    This 52 y.o. female presents to the clinic today for 1.5 year follow-upstatus post radiation therapy to right chest wall peripheral lymphatics for stage IIIB invasive mammary carcinoma.  REFERRING PROVIDER: Idelle Crouch, MD  HPI: patient is a 52 year old female now out 1.5 years having completedradiation therapy to her right chest wall peripheral lymphatics for stage IIIB (T4 N1 M0) grade 3 invasive mammary carcinoma triple positive. She had neoadjuvant chemotherapy followed by bilateral mastectomies. She had started the process of breast reconstruction got an infection and implant had to be removed. She's now so scheduled for latissimus flap..she is otherwise without complaint specifically denies any chest wall pain any swelling of her upper extremities cough or bone pain.  COMPLICATIONS OF TREATMENT: none  FOLLOW UP COMPLIANCE: keeps appointments   PHYSICAL EXAM:  BP (!) 143/92 (BP Location: Left Arm, Patient Position: Sitting, Cuff Size: Small)   Pulse 93   Temp (!) 97.2 F (36.2 C)   Resp 12   Ht 5' (1.524 m)   Wt 117 lb 6.3 oz (53.3 kg)   BMI 22.93 kg/m  Patient has bilateral mastectomies. No evidence of chest wall mass or nodularity is noted no axillary or supraclavicular adenopathy is identified. No evidence of lymphedema in either upper extremity is noted. Well-developed well-nourished patient in NAD. HEENT reveals PERLA, EOMI, discs not visualized.  Oral cavity is clear. No oral mucosal lesions are identified. Neck is clear without evidence of cervical or supraclavicular adenopathy. Lungs are clear to A&P. Cardiac examination is essentially unremarkable with regular rate and rhythm without murmur rub or thrill. Abdomen is benign with no organomegaly or masses noted. Motor sensory and DTR levels are equal and symmetric in the upper and lower extremities. Cranial  nerves II through XII are grossly intact. Proprioception is intact. No peripheral adenopathy or edema is identified. No motor or sensory levels are noted. Crude visual fields are within normal range.  RADIOLOGY RESULTS: no current films for review  PLAN: the present time patient is doing well with no evidence of disease. She will can go through surgery for breast reconstruction which I wished her well. I've otherwise asked to see her back in 1 year for follow-up. She continues close follow-up care with medical oncology.  I would like to take this opportunity to thank you for allowing me to participate in the care of your patient.Noreene Filbert, MD

## 2018-01-07 DIAGNOSIS — K429 Umbilical hernia without obstruction or gangrene: Secondary | ICD-10-CM | POA: Diagnosis not present

## 2018-01-07 DIAGNOSIS — Z90710 Acquired absence of both cervix and uterus: Secondary | ICD-10-CM | POA: Diagnosis not present

## 2018-01-07 DIAGNOSIS — Z9013 Acquired absence of bilateral breasts and nipples: Secondary | ICD-10-CM | POA: Diagnosis not present

## 2018-01-07 DIAGNOSIS — Z01818 Encounter for other preprocedural examination: Secondary | ICD-10-CM | POA: Diagnosis not present

## 2018-01-07 DIAGNOSIS — R937 Abnormal findings on diagnostic imaging of other parts of musculoskeletal system: Secondary | ICD-10-CM | POA: Diagnosis not present

## 2018-01-14 ENCOUNTER — Other Ambulatory Visit: Payer: Self-pay

## 2018-01-14 MED ORDER — LIDOCAINE 5 % EX CREA: 1.0000 "application " | TOPICAL_CREAM | Freq: Every day | CUTANEOUS | 0 refills | Status: DC

## 2018-01-21 ENCOUNTER — Encounter: Payer: Self-pay | Admitting: Hematology and Oncology

## 2018-01-23 NOTE — Progress Notes (Signed)
Power share request sent to [facility] imaging for CT A/P 01/07/18. Faxed confirmation of receipt received. Radiology notified to push images through once received. Oncology Nurse Navigator Documentation  Navigator Location: [facility] (01/23/18 1000)   )Navigator Encounter Type: Letter/Fax/Email (01/23/18 1000) Telephone: Diagnostic Results (01/23/18 1000)                                                  Time Spent with Patient: 15 (01/23/18 1000)

## 2018-02-03 ENCOUNTER — Encounter: Payer: Self-pay | Admitting: Hematology and Oncology

## 2018-02-03 DIAGNOSIS — Z01818 Encounter for other preprocedural examination: Secondary | ICD-10-CM | POA: Diagnosis not present

## 2018-02-06 ENCOUNTER — Other Ambulatory Visit: Payer: Self-pay | Admitting: Radiation Oncology

## 2018-02-06 ENCOUNTER — Ambulatory Visit
Admission: RE | Admit: 2018-02-06 | Discharge: 2018-02-06 | Disposition: A | Discharge status: Home / Self Care | Payer: Self-pay | Source: Ambulatory Visit | Attending: Radiation Oncology | Admitting: Radiation Oncology

## 2018-02-06 DIAGNOSIS — Z1239 Encounter for other screening for malignant neoplasm of breast: Secondary | ICD-10-CM

## 2018-02-06 NOTE — Progress Notes (Signed)
Second request sent to [facility] imaging for powershare request. Oncology Nurse Navigator Documentation  Navigator Location: [facility] Onc (02/06/18 1000)   )Navigator Encounter Type: Letter/Fax/Email (02/06/18 1000) Telephone: Diagnostic Results (02/06/18 1000)                                                  Time Spent with Patient: 15 (02/06/18 1000)

## 2018-02-27 ENCOUNTER — Other Ambulatory Visit: Payer: Self-pay

## 2018-02-27 ENCOUNTER — Ambulatory Visit: Payer: Self-pay | Admitting: Hematology and Oncology

## 2018-03-04 ENCOUNTER — Other Ambulatory Visit: Payer: Self-pay

## 2018-03-04 MED ORDER — SULFAMETHOXAZOLE-TRIMETHOPRIM 800-160 MG PO TABS: 1.0000 | ORAL_TABLET | Freq: Two times a day (BID) | ORAL | 0 refills | Status: DC

## 2018-03-04 NOTE — Progress Notes (Signed)
b

## 2018-03-05 ENCOUNTER — Other Ambulatory Visit: Payer: Self-pay

## 2018-03-05 MED ORDER — LIDOCAINE 5 % EX CREA: 1.0000 "application " | TOPICAL_CREAM | Freq: Every day | CUTANEOUS | 3 refills | Status: DC

## 2018-03-20 ENCOUNTER — Other Ambulatory Visit: Payer: Self-pay

## 2018-03-20 ENCOUNTER — Encounter: Payer: Self-pay | Admitting: Hematology and Oncology

## 2018-03-20 ENCOUNTER — Inpatient Hospital Stay: Payer: 59 | Attending: Hematology and Oncology

## 2018-03-20 ENCOUNTER — Inpatient Hospital Stay (HOSPITAL_BASED_OUTPATIENT_CLINIC_OR_DEPARTMENT_OTHER): Payer: 59 | Admitting: Hematology and Oncology

## 2018-03-20 VITALS — BP 149/78 | HR 80 | Temp 97.1°F | Resp 16 | Wt 117.3 lb

## 2018-03-20 DIAGNOSIS — C773 Secondary and unspecified malignant neoplasm of axilla and upper limb lymph nodes: Secondary | ICD-10-CM | POA: Insufficient documentation

## 2018-03-20 DIAGNOSIS — C50111 Malignant neoplasm of central portion of right female breast: Secondary | ICD-10-CM

## 2018-03-20 DIAGNOSIS — Z803 Family history of malignant neoplasm of breast: Secondary | ICD-10-CM

## 2018-03-20 DIAGNOSIS — Z923 Personal history of irradiation: Secondary | ICD-10-CM

## 2018-03-20 DIAGNOSIS — L089 Local infection of the skin and subcutaneous tissue, unspecified: Secondary | ICD-10-CM | POA: Insufficient documentation

## 2018-03-20 DIAGNOSIS — Z9882 Breast implant status: Secondary | ICD-10-CM | POA: Diagnosis not present

## 2018-03-20 DIAGNOSIS — Z8041 Family history of malignant neoplasm of ovary: Secondary | ICD-10-CM | POA: Diagnosis not present

## 2018-03-20 DIAGNOSIS — C50911 Malignant neoplasm of unspecified site of right female breast: Secondary | ICD-10-CM | POA: Insufficient documentation

## 2018-03-20 DIAGNOSIS — Z79811 Long term (current) use of aromatase inhibitors: Secondary | ICD-10-CM

## 2018-03-20 DIAGNOSIS — Z9221 Personal history of antineoplastic chemotherapy: Secondary | ICD-10-CM | POA: Diagnosis not present

## 2018-03-20 DIAGNOSIS — Z9013 Acquired absence of bilateral breasts and nipples: Secondary | ICD-10-CM | POA: Diagnosis not present

## 2018-03-20 DIAGNOSIS — Z170 Estrogen receptor positive status [ER+]: Principal | ICD-10-CM

## 2018-03-20 LAB — CBC WITH DIFFERENTIAL/PLATELET
Basophils Absolute: 0 10*3/uL (ref 0–0.1)
Basophils Relative: 0 %
Eosinophils Absolute: 0.1 10*3/uL (ref 0–0.7)
Eosinophils Relative: 1 %
HCT: 35.7 % (ref 35.0–47.0)
Hemoglobin: 12.2 g/dL (ref 12.0–16.0)
Lymphocytes Relative: 16 %
Lymphs Abs: 1.2 10*3/uL (ref 1.0–3.6)
MCH: 31.2 pg (ref 26.0–34.0)
MCHC: 34.1 g/dL (ref 32.0–36.0)
MCV: 91.5 fL (ref 80.0–100.0)
Monocytes Absolute: 0.4 10*3/uL (ref 0.2–0.9)
Monocytes Relative: 5 %
Neutro Abs: 5.7 10*3/uL (ref 1.4–6.5)
Neutrophils Relative %: 78 %
Platelets: 261 10*3/uL (ref 150–440)
RBC: 3.9 MIL/uL (ref 3.80–5.20)
RDW: 13.3 % (ref 11.5–14.5)
WBC: 7.4 10*3/uL (ref 3.6–11.0)

## 2018-03-20 LAB — COMPREHENSIVE METABOLIC PANEL
ALT: 17 U/L (ref 14–54)
AST: 21 U/L (ref 15–41)
Albumin: 4 g/dL (ref 3.5–5.0)
Alkaline Phosphatase: 60 U/L (ref 38–126)
Anion gap: 11 (ref 5–15)
BUN: 18 mg/dL (ref 6–20)
CO2: 23 mmol/L (ref 22–32)
Calcium: 9.7 mg/dL (ref 8.9–10.3)
Chloride: 108 mmol/L (ref 101–111)
Creatinine, Ser: 0.67 mg/dL (ref 0.44–1.00)
GFR calc Af Amer: >60 mL/min (ref >60)
GFR calc non Af Amer: >60 mL/min (ref >60)
Glucose, Bld: 92 mg/dL (ref 65–99)
Potassium: 3.7 mmol/L (ref 3.5–5.1)
Sodium: 142 mmol/L (ref 135–145)
Total Bilirubin: 0.6 mg/dL (ref 0.3–1.2)
Total Protein: 6.9 g/dL (ref 6.5–8.1)

## 2018-03-20 NOTE — Progress Notes (Signed)
[facility] day:  03/20/18   Chief Complaint: Emma Atkinson is a 52 y.o. female with clinical stage IIB Her2/neu+ right breast cancer on tamoxifen who is seen for 4 month assessment.  HPI:  The patient was last seen in the medical oncology on 10/28/2017.  At that time, she had fatigue and memory issues.  She had increasing vasomotor symptoms despite Effexor 37.5 mg daily.  Exam was unremarkable. Labs were stable.  Effexor XR was increased to 75 mg/day.  FSH was 42.4 and estradiol < 5.0 c/w post-menopausal state.  During the interim, patient is doing well overall. She is having a latissimus flap surgery in 2 weeks with Dr. Iven Finn Rosana Hoes and [facility]). Patient continues on her tamoxifen as prescribed. She continues to have manageable vasomotor symptoms. She notes that she is no longer taking the Effexor. She states, "I weaned myself off. I feel so much better off of it".   She has been off 6-8 weeks.   Patient does not verbalize any concerns with regards to her breasts today. Patient does perform monthly self breast examinations as recommended. Patient denies that she has experienced any B symptoms. She denies any interval infections.  Patient maintains an adequate appetite, and notes that she is eating well. Weight, compared to her last visit to the clinic, has increased by 2 pounds.   Patient denies pain in the clinic today.   Past Medical History:  Diagnosis Date  . Anemia    H/O  . Anxiety   . Breast cancer ([place])    breast  . Chronic kidney disease 12-2015   PYLONEPHRITIS  . Complication of anesthesia    HARD TO WAKE UP AFTER TONSILLECTOMY AS A CHILD BUT NO PROBLEMS SINCE  . Complication of anesthesia    PT STATES THAT BACK IN OR DURING PORT PLACMENT IN 2016 THAT SHE BECAME VEERY FLUSHED, LIGHT HEADED AND CRNA SAID ANTIBIOTIC MAY BE GOING IN IN TO FAST-RATE DECREASED AND PTS SYMPTOMS RESOLVED IMMEDIATELY  . Depression   . GERD  (gastroesophageal reflux disease)   . Shortness of breath dyspnea     Past Surgical History:  Procedure Laterality Date  . ABDOMINAL HYSTERECTOMY  2001  . BACK SURGERY     FOR SCOLIOSIS-HERRINGTON RODS IN PLACE  . BREAST BIOPSY Bilateral    08/17/2015   . PORTACATH PLACEMENT N/A 08/30/2015   Procedure: INSERTION PORT-A-CATH;  Surgeon: Leonie Green, MD;  Location: [facility] ORS;  Service: General;  Laterality: N/A;  . SENTINEL NODE BIOPSY Bilateral 03/08/2016   Procedure: SENTINEL NODE BIOPSY;  Surgeon: Leonie Green, MD;  Location: [facility] ORS;  Service: General;  Laterality: Bilateral;  . SIMPLE MASTECTOMY WITH AXILLARY SENTINEL NODE BIOPSY Bilateral 03/08/2016   Procedure: SIMPLE MASTECTOMY;  Surgeon: Leonie Green, MD;  Location: [facility] ORS;  Service: General;  Laterality: Bilateral;  . TONSILLECTOMY      Family History  Problem Relation Age of Onset  . Breast cancer Maternal Aunt 64  . Cancer Father   . Cancer Sister   . Cancer Maternal Grandfather     Social History:  reports that she quit smoking about 18 years ago. Her smoking use included cigarettes. She has a 10.00 pack-year smoking history. She has never used smokeless tobacco. She reports that she drinks alcohol. She reports that she does not use drugs.  Her husband states that she drinks 2-4 bottles of wine/week.  She returned to full-time work on 10/24/2016.  Her daughter was married in 01/2017.  She works out on the treadmill 3 days/week.  The patient is alone today.  Allergies:  Allergies  Allergen Reactions  . Ceftin [Cefuroxime Axetil] Rash    Current Medications: Current Outpatient Medications  Medication Sig Dispense Refill  . ALPRAZolam (XANAX) 0.25 MG tablet Take 0.0625 mg by mouth 3 (three) times daily as needed for anxiety.    . Multiple Vitamin (MULTIVITAMIN) capsule Take 1 capsule by mouth daily.    . tamoxifen (NOLVADEX) 20 MG tablet TAKE 1 TABLET (20 MG TOTAL) BY MOUTH DAILY. 30 tablet 5  .  Lidocaine 5 % CREA Apply 1 application topically daily. (Patient not taking: Reported on 03/20/2018) 30 g 3   No current facility-administered medications for this visit.     Review of Systems:  GENERAL:  Feels good.  No fevers, sweats or weight loss.  Weight up 2 pounds. PERFORMANCE STATUS (ECOG): 0 HEENT:  No visual changes, runny nose, sore throat, mouth sores or tenderness. Lungs: No shortness of breath or cough.  No hemoptysis. Cardiac:  No chest pain, palpitations, orthopnea, or PND. GI:  No nausea, vomiting, diarrhea, constipation, melena or hematochezia. GU:  No urgency, frequency, dysuria, or hematuria. Musculoskeletal:  No back pain.  No joint pain.  No muscle tenderness. Extremities:  No pain or swelling. Skin:  No rashes or skin changes. Neuro:  No memory concerns.  No headache, numbness or weakness, balance or coordination issues. Endocrine:  No diabetes, thyroid issues, hot flashes or night sweats.  Feels better off Effexor. Psych:  No mood changes, depression or anxiety. Pain:  No focal pain. Review of systems:  All other systems reviewed and found to be negative.   Physical Exam: Blood pressure (!) 149/78, pulse 80, temperature (!) 97.1 F (36.2 C), temperature source Tympanic, resp. rate 16, weight 117 lb 5 oz (53.2 kg). O2 sats 94% with ambulation. GENERAL:  Thin tan woman sitting comfortably in the exam room in no acute distress. MENTAL STATUS:  Alert and oriented to person, place and time. HEAD:  Shoulder length blonde hair.  Normocephalic, atraumatic, face symmetric, no Cushingoid features. EYES:  Blue eyes.  Pupils equal round and reactive to light and accomodation.  No conjunctivitis or scleral icterus. ENT:  Oropharynx clear without lesion.  Tongue normal. Mucous membranes moist.  RESPIRATORY:  Clear to auscultation without rales, wheezes or rhonchi. CARDIOVASCULAR:  Regular rate and rhythm without murmur, rub or gallop. BREAST:  s/p bilateral mastectomies.  No  erythema or nodularity. ABDOMEN:  Soft, non-tender, with active bowel sounds, and no hepatosplenomegaly.  No masses. SKIN:  No rashes, ulcers or lesions. EXTREMITIES: No edema, no skin discoloration or tenderness.  No palpable cords. LYMPH NODES: No palpable cervical, supraclavicular, axillary or inguinal adenopathy  NEUROLOGICAL: Unremarkable. PSYCH:  Appropriate.    Orders Only on 03/20/2018  Component Date Value Ref Range Status  . Sodium 03/20/2018 142  135 - 145 mmol/L Final  . Potassium 03/20/2018 3.7  3.5 - 5.1 mmol/L Final  . Chloride 03/20/2018 108  101 - 111 mmol/L Final  . CO2 03/20/2018 23  22 - 32 mmol/L Final  . Glucose, Bld 03/20/2018 92  65 - 99 mg/dL Final  . BUN 03/20/2018 18  6 - 20 mg/dL Final  . Creatinine, Ser 03/20/2018 0.67  0.44 - 1.00 mg/dL Final  . Calcium 03/20/2018 9.7  8.9 - 10.3 mg/dL Final  . Total Protein 03/20/2018 6.9  6.5 - 8.1 g/dL Final  . Albumin  03/20/2018 4.0  3.5 - 5.0 g/dL Final  . AST 03/20/2018 21  15 - 41 U/L Final  . ALT 03/20/2018 17  14 - 54 U/L Final  . Alkaline Phosphatase 03/20/2018 60  38 - 126 U/L Final  . Total Bilirubin 03/20/2018 0.6  0.3 - 1.2 mg/dL Final  . GFR calc non Af Amer 03/20/2018 >60  >60 mL/min Final  . GFR calc Af Amer 03/20/2018 >60  >60 mL/min Final   Comment: (NOTE) The eGFR has been calculated using the CKD EPI equation. This calculation has not been validated in all clinical situations. eGFR's persistently <60 mL/min signify possible Chronic Kidney Disease.   Georgiann Hahn gap 03/20/2018 11  5 - 15 Final   Performed at [facility], [address] [place], [place] [address]  . WBC 03/20/2018 7.4  3.6 - 11.0 K/uL Final  . RBC 03/20/2018 3.90  3.80 - 5.20 MIL/uL Final  . Hemoglobin 03/20/2018 12.2  12.0 - 16.0 g/dL Final  . HCT 03/20/2018 35.7  35.0 - 47.0 % Final  . MCV 03/20/2018 91.5  80.0 - 100.0 fL Final  . MCH 03/20/2018 31.2  26.0 - 34.0 pg Final  . MCHC 03/20/2018 34.1  32.0 - 36.0 g/dL Final   . RDW 03/20/2018 13.3  11.5 - 14.5 % Final  . Platelets 03/20/2018 261  150 - 440 K/uL Final  . Neutrophils Relative % 03/20/2018 78  % Final  . Neutro Abs 03/20/2018 5.7  1.4 - 6.5 K/uL Final  . Lymphocytes Relative 03/20/2018 16  % Final  . Lymphs Abs 03/20/2018 1.2  1.0 - 3.6 K/uL Final  . Monocytes Relative 03/20/2018 5  % Final  . Monocytes Absolute 03/20/2018 0.4  0.2 - 0.9 K/uL Final  . Eosinophils Relative 03/20/2018 1  % Final  . Eosinophils Absolute 03/20/2018 0.1  0 - 0.7 K/uL Final  . Basophils Relative 03/20/2018 0  % Final  . Basophils Absolute 03/20/2018 0.0  0 - 0.1 K/uL Final   Performed at [facility], [address] [place], [place] [address]    Assessment:  Emma Atkinson is a 52 y.o. female with clinical stage T2N1 (stage IIB) Her2/neu + right breast cancer s/p neoadjuvant chemotherapy and bilateral mastectomy.  She completed a year of adjuvant Herceptin.  She presented with a minimally painful right breast mass with nipple inversion.  Bilateral mammogram and ultrasound on 08/12/2015 revealed a 4.1 cm right superior breast mass containing pleomorphic microcalcifications. There were several satellite lesions (1.3 cm, 0.9 cm, 0.6 cm) plus a 1.3 cm mass versus complicated cyst and a 0.8 cm cyst.  There was one 5 mm right axillary node with irregular thickened cortex.   Core needle biopsy on 08/17/2015 of the right breast mass revealed a grade 3 invasive mammary carcinoma. Right axillary biopsy confirmed metastatic mammary carcinoma. Tumor was ER positive (1-10%), PR positive (11-50%) and HER-2/neu 3+  PET scan on 08/26/2015 revealed hypermetabolic right breast mass (SUV 11.3) with a hypermetabolic right axillary lymph node and a subtle focus of very faint hypermetabolic activity laterally in the right breast. There was faint hypermetabolic activity at the biopsy site of the left breast (? biopsy related).    CA27.29 has been followed: 19.6 on 08/22/2015, 24.7 on  01/10/2016, 15.5 on 03/23/2016, 16.3 on 12/06/2016, 17.4 on 03/05/2017, 11.7 on 04/25/2017, 15.4 on 07/01/2017, 12.9 on 10/28/2017, and 18.0 on 03/20/2018.  She received 4 cycles of AC (09/13/2015 - 11/08/2015) with Neulasta support.  She received 4 cycles of Herceptin and Perjeta (11/29/2015 - 01/31/2016) and 12 weeks of Taxol (11/29/2015 - 02/14/2016).  She tolerated her chemotherapy well.  She developed a grade I neuropathy.  She completed a year of adjuvant Herceptin on 11/15/2016.  She began tamoxifen on 07/05/2016.    She was on neratinib from 12/06/2016 - 01/01/2017.  She had significant diarrhea affecting quality of life.  She has ssues with constipation (baseline on Miralax).  She was on Effexor for vasomotor symptoms (discontinued in 01/2018).  Bilateral mastectomy on 03/08/2016 revealed no residual disease in the right breast.  Five lymph nodes were negative for malignancy.  Pathologic stage was ypT0 ypN0.  Left breast revealed benign intraductal papillomas and cysts adjacent to the biopsy site.  There was no atypia or malignancy.  She underwent reconstruction in 09/2016 or 10/2016 then implant removal in 08/2017 secondary to infection.  She received 50.4 Gy right chest wall radiation from 05/07/2016 - 06/25/2016.  She underwent reconstructive surgery on 10/10/2016.  She completed reconstruction on  01/11/2017.  She is s/p partial hysterectomy (ovaries remain) in 2001.  She was premenopausal ([place] 5 and estradiol 271.9) on 08/22/2015.   She is post-menopauasal ([place] 42.4 and estradiol < 5.0) on 10/28/2017.  Echo on 08/26/2015 revealed an ejection fraction of 55-60%.  Echo on 11/25/2015 revealed an EF of 60-65%.  Echo on 02/23/2016 and 05/25/2016 revealed an EF of 55-65%.  Echo on 08/31/2016 revealed an EF of 50-55%.  Echo on 11/29/2016 revealed an EF of 60-65%.  She has a family history of breast and ovarian cancer.  My Risk genetic testing revealed no mutation on 08/22/2015.  She developed  a skin infection overlying her right breast implant.  She is s/p a course of Septra.  Symptomatically, she feels much better off Effexor.  She is scheduled to undergo latissimus flap surgery in 2 weeks.  Plan: 1.  Labs today:  CBC with diff, CMP, CA27.29. 2.  Continue tamoxifen.  3.  Consider switch to an aromatase inhibitor at next visit. 4.  Discuss upcoming reconstructive surgery. 5.  RTC in 6 months for MD assessment and labs (CBC with diff, CMP, CA27.29).   Honor Loh, NP 03/20/2018, 3:09 PM   I saw and evaluated the patient, participating in the key portions of the service and reviewing pertinent diagnostic studies and records.  I reviewed the nurse practitioner's note and agree with the findings and the plan.  The assessment and plan were discussed with the patient.  Several questions were asked by the patient and answered.   Nolon Stalls, MD 03/20/2018, 3:09 PM

## 2018-03-20 NOTE — Progress Notes (Signed)
Pt in for follow up, denies any difficulties or concerns.  

## 2018-03-21 LAB — CANCER ANTIGEN 27.29: CA 27.29: 18 U/mL (ref 0.0–38.6)

## 2018-03-27 ENCOUNTER — Other Ambulatory Visit: Payer: Self-pay

## 2018-03-27 MED ORDER — AZITHROMYCIN 250 MG PO TABS: ORAL_TABLET | ORAL | 0 refills | Status: DC

## 2018-04-04 DIAGNOSIS — Z9221 Personal history of antineoplastic chemotherapy: Secondary | ICD-10-CM | POA: Diagnosis not present

## 2018-04-04 DIAGNOSIS — Z923 Personal history of irradiation: Secondary | ICD-10-CM | POA: Diagnosis not present

## 2018-04-04 DIAGNOSIS — Z87891 Personal history of nicotine dependence: Secondary | ICD-10-CM | POA: Diagnosis not present

## 2018-04-04 DIAGNOSIS — Z9013 Acquired absence of bilateral breasts and nipples: Secondary | ICD-10-CM | POA: Diagnosis not present

## 2018-04-04 DIAGNOSIS — Z421 Encounter for breast reconstruction following mastectomy: Secondary | ICD-10-CM | POA: Diagnosis not present

## 2018-04-04 DIAGNOSIS — Z853 Personal history of malignant neoplasm of breast: Secondary | ICD-10-CM | POA: Diagnosis not present

## 2018-06-18 ENCOUNTER — Other Ambulatory Visit: Payer: Self-pay | Admitting: Hematology and Oncology

## 2018-06-18 DIAGNOSIS — C50811 Malignant neoplasm of overlapping sites of right female breast: Secondary | ICD-10-CM

## 2018-06-18 DIAGNOSIS — Z170 Estrogen receptor positive status [ER+]: Principal | ICD-10-CM

## 2018-06-27 DIAGNOSIS — C50911 Malignant neoplasm of unspecified site of right female breast: Secondary | ICD-10-CM | POA: Diagnosis not present

## 2018-06-27 DIAGNOSIS — Z421 Encounter for breast reconstruction following mastectomy: Secondary | ICD-10-CM | POA: Diagnosis not present

## 2018-06-27 DIAGNOSIS — Z853 Personal history of malignant neoplasm of breast: Secondary | ICD-10-CM | POA: Diagnosis not present

## 2018-06-27 DIAGNOSIS — Z9221 Personal history of antineoplastic chemotherapy: Secondary | ICD-10-CM | POA: Diagnosis not present

## 2018-06-27 DIAGNOSIS — Z923 Personal history of irradiation: Secondary | ICD-10-CM | POA: Diagnosis not present

## 2018-06-27 DIAGNOSIS — Z9013 Acquired absence of bilateral breasts and nipples: Secondary | ICD-10-CM | POA: Diagnosis not present

## 2018-06-27 DIAGNOSIS — Z87891 Personal history of nicotine dependence: Secondary | ICD-10-CM | POA: Diagnosis not present

## 2018-06-27 DIAGNOSIS — N651 Disproportion of reconstructed breast: Secondary | ICD-10-CM | POA: Diagnosis not present

## 2018-06-27 DIAGNOSIS — K219 Gastro-esophageal reflux disease without esophagitis: Secondary | ICD-10-CM | POA: Diagnosis not present

## 2018-07-11 ENCOUNTER — Other Ambulatory Visit: Payer: Self-pay

## 2018-07-11 MED ORDER — FLUCONAZOLE 150 MG PO TABS: ORAL_TABLET | ORAL | 0 refills | Status: DC

## 2018-08-07 ENCOUNTER — Encounter: Payer: Self-pay | Admitting: Gastroenterology

## 2018-08-15 ENCOUNTER — Other Ambulatory Visit: Payer: Self-pay

## 2018-08-15 ENCOUNTER — Encounter: Payer: Self-pay | Admitting: Occupational Therapy

## 2018-08-15 ENCOUNTER — Ambulatory Visit: Payer: 59 | Attending: Plastic Surgery | Admitting: Occupational Therapy

## 2018-08-15 DIAGNOSIS — M25611 Stiffness of right shoulder, not elsewhere classified: Secondary | ICD-10-CM | POA: Diagnosis not present

## 2018-08-15 DIAGNOSIS — L905 Scar conditions and fibrosis of skin: Secondary | ICD-10-CM

## 2018-08-15 NOTE — Patient Instructions (Signed)
AAROM on wall for shoulder flexion, ABD  10 reps   External rotation gentle stretch in corner  10 reps  hold 5  Scaption with golf club in supine  External rotation over head  Shoulder flexion  All with golf club   10 reps

## 2018-08-15 NOTE — Therapy (Signed)
[facility] [address], [place], [address] Phone: [phone]   Fax:  [phone]  Occupational Therapy Evaluation  Patient Details  Name: Emma Atkinson MRN: [id number] Date of Birth: 1966/02/18 Referring Provider (OT): Stevphen Rochester   Encounter Date: 08/15/2018  OT End of Session - 08/15/18 1936    Visit Number  1    Number of Visits  6    Date for OT Re-Evaluation  09/26/18    OT Start Time  0945    OT Stop Time  1025    OT Time Calculation (min)  40 min    Activity Tolerance  Patient tolerated treatment well    Behavior During Therapy  [facility] for tasks assessed/performed       Past Medical History:  Diagnosis Date  . Anemia    H/O  . Anxiety   . Breast cancer ([place])    breast  . Chronic kidney disease 12-2015   PYLONEPHRITIS  . Complication of anesthesia    HARD TO WAKE UP AFTER TONSILLECTOMY AS A CHILD BUT NO PROBLEMS SINCE  . Complication of anesthesia    PT STATES THAT BACK IN OR DURING PORT PLACMENT IN 2016 THAT SHE BECAME VEERY FLUSHED, LIGHT HEADED AND CRNA SAID ANTIBIOTIC MAY BE GOING IN IN TO FAST-RATE DECREASED AND PTS SYMPTOMS RESOLVED IMMEDIATELY  . Depression   . GERD (gastroesophageal reflux disease)   . Shortness of breath dyspnea     Past Surgical History:  Procedure Laterality Date  . ABDOMINAL HYSTERECTOMY  2001  . BACK SURGERY     FOR SCOLIOSIS-HERRINGTON RODS IN PLACE  . BREAST BIOPSY Bilateral    08/17/2015   . PORTACATH PLACEMENT N/A 08/30/2015   Procedure: INSERTION PORT-A-CATH;  Surgeon: Leonie Green, MD;  Location: [facility] ORS;  Service: General;  Laterality: N/A;  . SENTINEL NODE BIOPSY Bilateral 03/08/2016   Procedure: SENTINEL NODE BIOPSY;  Surgeon: Leonie Green, MD;  Location: [facility] ORS;  Service: General;  Laterality: Bilateral;  . SIMPLE MASTECTOMY WITH AXILLARY SENTINEL NODE BIOPSY Bilateral 03/08/2016   Procedure: SIMPLE MASTECTOMY;  Surgeon:  Leonie Green, MD;  Location: [facility] ORS;  Service: General;  Laterality: Bilateral;  . TONSILLECTOMY      There were no vitals filed for this visit.  Subjective Assessment - 08/15/18 1123    Subjective   I had a lot of surgery to these breast - I never had therapy - done some range of motion - but my R shoulder under arm and above my breast are tight and pull when reaching over head , backwards  - but not really pain     Pertinent History  MASTECTOMY Bilateral 2017 May ;PR BREAST RECONSTRUC W TISS EXPANDR Bilateral 10/10/2016 ; 01/11/17 implants but develop infection , expanders out 11/18 ; 04/04/18 lat flaps , and expanders ,  and 06/27/18 last surgery     Patient Stated Goals  I just want to get my motion back in my R dominant arm and more stregnth so I can work out again like planks, light loose weight and my treadmill     Currently in Pain?  No/denies        [facility] OT Assessment - 08/15/18 0001      Assessment   Medical Diagnosis  --   RIGHT BREAST RECONSTRUCTION WITH LATISSIMUS FLAP and bil imp   Referring Provider (OT)  Stevphen Rochester    Onset Date/Surgical Date  04/04/18  Hand Dominance  Right    Prior Therapy  none      Home  Environment   Lives With  Spouse      Prior Function   Vocation  Full time employment    Leisure  Environmental education officer at [employer] , use to work out , yard work       AROM   Right Shoulder Extension  55 Degrees    Right Shoulder Flexion  140 Degrees    Right Shoulder ABduction  125 Degrees    Left Shoulder Extension  65 Degrees    Left Shoulder Flexion  170 Degrees    Left Shoulder ABduction  170 Degrees         AAROM on wall for shoulder flexion, ABD  10 reps   External rotation gentle stretch in corner  10 reps  hold 5  Scaption with golf club in supine  External rotation over head  Shoulder flexion  All with golf club   10 reps                 OT Education - 08/15/18 1936    Education Details  findings of eval and HEP      Person(s) Educated  Patient    Methods  Demonstration;Explanation;Handout    Comprehension  Verbalized understanding;Returned demonstration          OT Long Term Goals - 08/15/18 1956      OT LONG TERM GOAL #1   Title  Pt to be ind in HEP to increase AROM in R shoulder to WNL with symptoms less than 2/10     Baseline  see flowsheet     Time  3    Period  Weeks    Status  New    Target Date  09/05/18      OT LONG TERM GOAL #2   Title  Pt to be independent in HEP to intiate strengthening HEP to be able to cont at home with conditioning  and workout routine     Baseline  no strengthening and workout with weights - had multiple breast surgeries since May 17     Time  6    Period  Weeks    Status  New    Target Date  09/26/18            Plan - 08/15/18 1937    Clinical Impression Statement  Pt present about 7 wks out from her last breast reconstruction surgery - placement of implants, and fatty grafting - pt had in June lats flap reconstruction on R and expanders L and R - pt show decrease ROM in R shoulder with stretch or pull over pect major - pt had scoliosis surgery  in middle school- but report she had normal AROM prior - pt can benefit from soft tissue mobs , ROM  and strengthening    Occupational performance deficits (Please refer to evaluation for details):  IADL's;Leisure;Play    Rehab Potential  Good    OT Frequency  1x / week    OT Duration  6 weeks    OT Treatment/Interventions  Patient/family education;Passive range of motion;Manual Therapy;Therapeutic exercise    Plan  findings of eval and HEP     Clinical Decision Making  Limited treatment options, no task modification necessary    OT Home Exercise Plan  see pt instruction     Consulted and Agree with Plan of Care  Patient       Patient  will benefit from skilled therapeutic intervention in order to improve the following deficits and impairments:  Impaired flexibility, Impaired UE functional use,  Decreased strength  Visit Diagnosis: Stiffness of right shoulder, not elsewhere classified - Plan: Ot plan of care cert/re-cert  Scar condition and fibrosis of skin - Plan: Ot plan of care cert/re-cert    Problem List Patient Active Problem List   Diagnosis Date Noted  . Fatigue 05/01/2017  . Cellulitis 02/22/2017  . Hypokalemia 12/06/2016  . Hot flashes due to tamoxifen 08/18/2016  . Carcinoma of overlapping sites of right breast in female, estrogen receptor positive ([place]) 07/26/2016  . Loss of weight 12/06/2015  . Peripheral neuropathy due to chemotherapy ([place]) 12/06/2015  . Skin lesion 11/29/2015  . Low serum cortisol level ([place]) 09/19/2015  . Malignant neoplasm of central portion of right female breast ([place]) 09/04/2015  . Anxiety and depression 08/31/2015  . Insomnia, persistent 08/31/2015    Rosalyn Gess OTR/L,CLT 08/15/2018, 8:07 PM  [facility] [address] [place], [address] Phone: [phone]   Fax:  [phone]  Name: DILARA NAVARRETE MRN: [id number] Date of Birth: Jan 16, 1966

## 2018-08-22 ENCOUNTER — Ambulatory Visit: Payer: 59 | Admitting: Occupational Therapy

## 2018-08-22 DIAGNOSIS — L905 Scar conditions and fibrosis of skin: Secondary | ICD-10-CM

## 2018-08-22 DIAGNOSIS — M25611 Stiffness of right shoulder, not elsewhere classified: Secondary | ICD-10-CM

## 2018-08-22 NOTE — Patient Instructions (Signed)
Same HEP But add stabilization exercise for shoulder and scapual in supine at 90 degrees shoulder flexion  3-5 x 30 sec  And then can increase to 1 min in few days if pain free

## 2018-08-22 NOTE — Therapy (Signed)
[facility] [address], [place], [address] Phone: [phone]   Fax:  [phone]  Occupational Therapy Treatment  Patient Details  Name: Emma Atkinson MRN: [id number] Date of Birth: 02-28-1966 Referring Provider (OT): Stevphen Rochester   Encounter Date: 08/22/2018  OT End of Session - 08/22/18 1546    Visit Number  2    Number of Visits  6    Date for OT Re-Evaluation  09/26/18    OT Start Time  0918    OT Stop Time  0950    OT Time Calculation (min)  32 min    Activity Tolerance  Patient tolerated treatment well    Behavior During Therapy  [facility] for tasks assessed/performed       Past Medical History:  Diagnosis Date  . Anemia    H/O  . Anxiety   . Breast cancer ([place])    breast  . Chronic kidney disease 12-2015   PYLONEPHRITIS  . Complication of anesthesia    HARD TO WAKE UP AFTER TONSILLECTOMY AS A CHILD BUT NO PROBLEMS SINCE  . Complication of anesthesia    PT STATES THAT BACK IN OR DURING PORT PLACMENT IN 2016 THAT SHE BECAME VEERY FLUSHED, LIGHT HEADED AND CRNA SAID ANTIBIOTIC MAY BE GOING IN IN TO FAST-RATE DECREASED AND PTS SYMPTOMS RESOLVED IMMEDIATELY  . Depression   . GERD (gastroesophageal reflux disease)   . Shortness of breath dyspnea     Past Surgical History:  Procedure Laterality Date  . ABDOMINAL HYSTERECTOMY  2001  . BACK SURGERY     FOR SCOLIOSIS-HERRINGTON RODS IN PLACE  . BREAST BIOPSY Bilateral    08/17/2015   . PORTACATH PLACEMENT N/A 08/30/2015   Procedure: INSERTION PORT-A-CATH;  Surgeon: Leonie Green, MD;  Location: [facility] ORS;  Service: General;  Laterality: N/A;  . SENTINEL NODE BIOPSY Bilateral 03/08/2016   Procedure: SENTINEL NODE BIOPSY;  Surgeon: Leonie Green, MD;  Location: [facility];  Service: General;  Laterality: Bilateral;  . SIMPLE MASTECTOMY WITH AXILLARY SENTINEL NODE BIOPSY Bilateral 03/08/2016   Procedure: SIMPLE MASTECTOMY;  Surgeon:  Leonie Green, MD;  Location: [facility];  Service: General;  Laterality: Bilateral;  . TONSILLECTOMY      There were no vitals filed for this visit.  Subjective Assessment - 08/22/18 1541    Subjective   I have done my exercises and think my range of motion is better -still feeling some pull under my arm and above my breast - I am still planning surgery later in Dec for that loose skin under my arm     Patient Stated Goals  I just want to get my motion back in my R dominant arm and more stregnth so I can work out again like planks, light loose weight and my treadmill     Currently in Pain?  No/denies        Myofascial release to axilla , pect and serratus with shoulder in flexion - gentle with slight pull  Scar mobs done to scar in axilla where ln remove - adhere   also done this date about 7 times xtractor for scar mobs - pt tolerate well - little redness AAROM for shoulder flexion and ABD during soft tissue work and after  [employer] well  - less pain with flexion and after xtractor had less pull with ABD     Review HEP 5 reps each    AAROM on wall for shoulder  flexion, ABD  10 reps   External rotation gentle stretch in corner - need min A and verbal cues  10 reps  hold 5  Scaption with golf club in supine  External rotation over head  Shoulder flexion  All with golf club   10 reps     add stabilization exercise for shoulder and scapual in supine at 90 degrees shoulder flexion  3-5 x 30 sec  And then can increase to 1 min in few days if pain free            OT Education - 08/22/18 1546    Education Details  progress and HEP     Person(s) Educated  Patient    Methods  Demonstration;Explanation;Handout    Comprehension  Verbalized understanding;Returned demonstration          OT Long Term Goals - 08/15/18 1956      OT LONG TERM GOAL #1   Title  Pt to be ind in HEP to increase AROM in R shoulder to WNL with symptoms less than 2/10     Baseline   see flowsheet     Time  3    Period  Weeks    Status  New    Target Date  09/05/18      OT LONG TERM GOAL #2   Title  Pt to be independent in HEP to intiate strengthening HEP to be able to cont at home with conditioning  and workout routine     Baseline  no strengthening and workout with weights - had multiple breast surgeries since May 17     Time  6    Period  Weeks    Status  New    Target Date  09/26/18            Plan - 08/22/18 1546    Clinical Impression Statement  Pt now 8 wks since last breast recontruction - placement of implants and fatty grafting - pt show increase shoulder AROM in all planes since eval last week - pt to cont with same hEP  - tolerate soft tissue mobs and well    Occupational performance deficits (Please refer to evaluation for details):  IADL's;Leisure;Play    Rehab Potential  Good    OT Frequency  1x / week    OT Duration  --   5 wks   OT Treatment/Interventions  Patient/family education;Passive range of motion;Manual Therapy;Therapeutic exercise    Plan  assess progress and change HEP as needed     Clinical Decision Making  Limited treatment options, no task modification necessary    OT Home Exercise Plan  see pt instruction     Consulted and Agree with Plan of Care  Patient       Patient will benefit from skilled therapeutic intervention in order to improve the following deficits and impairments:  Impaired flexibility, Impaired UE functional use, Decreased strength  Visit Diagnosis: Stiffness of right shoulder, not elsewhere classified  Scar condition and fibrosis of skin    Problem List Patient Active Problem List   Diagnosis Date Noted  . Fatigue 05/01/2017  . Cellulitis 02/22/2017  . Hypokalemia 12/06/2016  . Hot flashes due to tamoxifen 08/18/2016  . Carcinoma of overlapping sites of right breast in female, estrogen receptor positive ([place]) 07/26/2016  . Loss of weight 12/06/2015  . Peripheral neuropathy due to chemotherapy  ([place]) 12/06/2015  . Skin lesion 11/29/2015  . Low serum cortisol level ([place]) 09/19/2015  . Malignant  neoplasm of central portion of right female breast ([place]) 09/04/2015  . Anxiety and depression 08/31/2015  . Insomnia, persistent 08/31/2015    Rosalyn Gess OTR/L;CLT 08/22/2018, 3:49 PM  [facility] [address] [place], [address] Phone: [phone]   Fax:  [phone]  Name: Emma Atkinson MRN: [id number] Date of Birth: 1965-10-26

## 2018-09-01 ENCOUNTER — Ambulatory Visit: Payer: 59 | Admitting: Occupational Therapy

## 2018-09-01 DIAGNOSIS — M25611 Stiffness of right shoulder, not elsewhere classified: Secondary | ICD-10-CM | POA: Diagnosis not present

## 2018-09-01 DIAGNOSIS — L905 Scar conditions and fibrosis of skin: Secondary | ICD-10-CM | POA: Diagnosis not present

## 2018-09-01 NOTE — Patient Instructions (Signed)
Scar mobs ed on doing  2-3 min - 2 x day   Stabilization exercises for scapula and shoulder - against wall with back  And at 90 degrees arms out to side = palms forwards do scapula squeezes Then palms back - same Retraction with hands in front Retraction with upper arms pump towards wall - in ext rotation position  15 -30 sec each  Then in week increase to 30-45 sec each   Cont with stretches

## 2018-09-01 NOTE — Therapy (Signed)
[facility] [address], [place], [address] Phone: [phone]   Fax:  [phone]  Occupational Therapy Treatment  Patient Details  Name: Emma Atkinson MRN: [id number] Date of Birth: Feb 08, 1966 Referring Provider (OT): Stevphen Rochester   Encounter Date: 09/01/2018  OT End of Session - 09/01/18 0946    Visit Number  3    Number of Visits  6    Date for OT Re-Evaluation  09/26/18    OT Start Time  0900    OT Stop Time  0932    OT Time Calculation (min)  32 min    Activity Tolerance  Patient tolerated treatment well    Behavior During Therapy  [facility] for tasks assessed/performed       Past Medical History:  Diagnosis Date  . Anemia    H/O  . Anxiety   . Breast cancer ([place])    breast  . Chronic kidney disease 12-2015   PYLONEPHRITIS  . Complication of anesthesia    HARD TO WAKE UP AFTER TONSILLECTOMY AS A CHILD BUT NO PROBLEMS SINCE  . Complication of anesthesia    PT STATES THAT BACK IN OR DURING PORT PLACMENT IN 2016 THAT SHE BECAME VEERY FLUSHED, LIGHT HEADED AND CRNA SAID ANTIBIOTIC MAY BE GOING IN IN TO FAST-RATE DECREASED AND PTS SYMPTOMS RESOLVED IMMEDIATELY  . Depression   . GERD (gastroesophageal reflux disease)   . Shortness of breath dyspnea     Past Surgical History:  Procedure Laterality Date  . ABDOMINAL HYSTERECTOMY  2001  . BACK SURGERY     FOR SCOLIOSIS-HERRINGTON RODS IN PLACE  . BREAST BIOPSY Bilateral    08/17/2015   . PORTACATH PLACEMENT N/A 08/30/2015   Procedure: INSERTION PORT-A-CATH;  Surgeon: Leonie Green, MD;  Location: [facility];  Service: General;  Laterality: N/A;  . SENTINEL NODE BIOPSY Bilateral 03/08/2016   Procedure: SENTINEL NODE BIOPSY;  Surgeon: Leonie Green, MD;  Location: [facility];  Service: General;  Laterality: Bilateral;  . SIMPLE MASTECTOMY WITH AXILLARY SENTINEL NODE BIOPSY Bilateral 03/08/2016   Procedure: SIMPLE MASTECTOMY;  Surgeon:  Leonie Green, MD;  Location: [facility];  Service: General;  Laterality: Bilateral;  . TONSILLECTOMY      There were no vitals filed for this visit.  Subjective Assessment - 09/01/18 0942    Subjective   I admit I did not do a lot of the exercises - we were out of town and I was out of my routine - still feel that pull under my axilla and above my breast - my next surgery schedule  for the 12/20    Patient Stated Goals  I just want to get my motion back in my R dominant arm and more stregnth so I can work out again like planks, light loose weight and my treadmill     Currently in Pain?  No/denies       Myofascial release to axilla , pect and serratus with shoulder in flexion - gentle with slight pull  Scar mobs done to scar in axilla where ln remove - adhere   done this date again about 5 times xtractor for scar mobs - pt tolerate well - little redness  and graston tool on interior upper arm - tightness  Pt ed on scar massage at home - 2 -3 min - 2 x day   AAROM for shoulder flexion and ABD during soft tissue work and after  [employer] well  -  less pull with flexion/ABD and ROM  after xtractor   Review HEP 5 reps each   cont with same stretches at home  and Scaption with golf club in supine  External rotation over head  Shoulder flexion  All with golf club  10 reps    done and add this date      Stabilization exercises for scapula and shoulder - against wall with back  And at 90 degrees arms out to side = palms forwards do scapula squeezes Then palms back - same Retraction with hands in front Retraction with upper arms pump towards wall - in ext rotation position  15 -30 sec each  Then in week increase to 30-45 sec each       Scar mobs ed on doing  2-3 min - 2 x day                  OT Education - 09/01/18 0945    Education Details  add new HEP    Person(s) Educated  Patient    Methods  Demonstration;Explanation;Handout    Comprehension   Verbalized understanding;Returned demonstration          OT Long Term Goals - 08/15/18 1956      OT LONG TERM GOAL #1   Title  Pt to be ind in HEP to increase AROM in R shoulder to WNL with symptoms less than 2/10     Baseline  see flowsheet     Time  3    Period  Weeks    Status  New    Target Date  09/05/18      OT LONG TERM GOAL #2   Title  Pt to be independent in HEP to intiate strengthening HEP to be able to cont at home with conditioning  and workout routine     Baseline  no strengthening and workout with weights - had multiple breast surgeries since May 17     Time  6    Period  Weeks    Status  New    Target Date  09/26/18            Plan - 09/01/18 0946    Clinical Impression Statement  Pt about 10 wks s/p - pt show increase ROM and able to intiated stabilization exercises to shoulder and scapula - pt has history of scoliosis surgeries years ago - cont to decrease scar tissue     Occupational performance deficits (Please refer to evaluation for details):  IADL's;Leisure;Play    Rehab Potential  Good    OT Frequency  Biweekly    OT Duration  4 weeks    OT Treatment/Interventions  Patient/family education;Passive range of motion;Manual Therapy;Therapeutic exercise    Plan  assess progress with new HEP and cont scar mobs and myofascial release    Clinical Decision Making  Limited treatment options, no task modification necessary    OT Home Exercise Plan  see pt instruction     Consulted and Agree with Plan of Care  Patient       Patient will benefit from skilled therapeutic intervention in order to improve the following deficits and impairments:  Impaired flexibility, Impaired UE functional use, Decreased strength  Visit Diagnosis: Stiffness of right shoulder, not elsewhere classified  Scar condition and fibrosis of skin    Problem List Patient Active Problem List   Diagnosis Date Noted  . Fatigue 05/01/2017  . Cellulitis 02/22/2017  . Hypokalemia  12/06/2016  . Hot  flashes due to tamoxifen 08/18/2016  . Carcinoma of overlapping sites of right breast in female, estrogen receptor positive ([place]) 07/26/2016  . Loss of weight 12/06/2015  . Peripheral neuropathy due to chemotherapy ([place]) 12/06/2015  . Skin lesion 11/29/2015  . Low serum cortisol level ([place]) 09/19/2015  . Malignant neoplasm of central portion of right female breast ([place]) 09/04/2015  . Anxiety and depression 08/31/2015  . Insomnia, persistent 08/31/2015    Rosalyn Gess OTR/L,CLT 09/01/2018, 9:48 AM  [facility] [address] [place], [address] Phone: [phone]   Fax:  [phone]  Name: Emma Atkinson MRN: [id number] Date of Birth: May 19, 1966

## 2018-09-04 ENCOUNTER — Ambulatory Visit (AMBULATORY_SURGERY_CENTER): Payer: Self-pay

## 2018-09-04 VITALS — Ht 60.0 in | Wt 114.4 lb

## 2018-09-04 DIAGNOSIS — Z1211 Encounter for screening for malignant neoplasm of colon: Secondary | ICD-10-CM

## 2018-09-04 MED ORDER — NA SULFATE-K SULFATE-MG SULF 17.5-3.13-1.6 GM/177ML PO SOLN: 1.0000 | Freq: Once | ORAL | 0 refills | Status: AC

## 2018-09-04 NOTE — Progress Notes (Signed)
Denies allergies to eggs or soy products. Denies complication of anesthesia or sedation. Denies use of weight loss medication. Denies use of O2.   Emmi instructions declined.   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

## 2018-09-09 ENCOUNTER — Encounter: Payer: Self-pay | Admitting: Gastroenterology

## 2018-09-15 ENCOUNTER — Ambulatory Visit: Payer: 59 | Attending: Plastic Surgery | Admitting: Occupational Therapy

## 2018-09-15 DIAGNOSIS — L905 Scar conditions and fibrosis of skin: Secondary | ICD-10-CM | POA: Diagnosis not present

## 2018-09-15 DIAGNOSIS — N651 Disproportion of reconstructed breast: Secondary | ICD-10-CM | POA: Diagnosis not present

## 2018-09-15 DIAGNOSIS — M25611 Stiffness of right shoulder, not elsewhere classified: Secondary | ICD-10-CM | POA: Insufficient documentation

## 2018-09-15 NOTE — Therapy (Signed)
[facility] [address], [place], [address] Phone: [phone]   Fax:  [phone]  Occupational Therapy Treatment  Patient Details  Name: Emma Atkinson MRN: [id number] Date of Birth: [birth year] Referring Provider (OT): Stevphen Rochester   Encounter Date: 09/15/2018  OT End of Session - 09/15/18 0933    Visit Number  4    Number of Visits  6    Date for OT Re-Evaluation  09/26/18    OT Start Time  0900    OT Stop Time  0930    OT Time Calculation (min)  30 min    Activity Tolerance  Patient tolerated treatment well    Behavior During Therapy  [facility] for tasks assessed/performed       Past Medical History:  Diagnosis Date  . Allergy   . Anemia    H/O  . Anxiety   . Blood transfusion without reported diagnosis   . Breast cancer ([place])    breast  . Chronic kidney disease 12-2015   PYLONEPHRITIS  . Complication of anesthesia    HARD TO WAKE UP AFTER TONSILLECTOMY AS A CHILD BUT NO PROBLEMS SINCE  . Complication of anesthesia    PT STATES THAT BACK IN OR DURING PORT PLACMENT IN 2016 THAT SHE BECAME VEERY FLUSHED, LIGHT HEADED AND CRNA SAID ANTIBIOTIC MAY BE GOING IN IN TO FAST-RATE DECREASED AND PTS SYMPTOMS RESOLVED IMMEDIATELY  . Depression   . GERD (gastroesophageal reflux disease)   . Hypertension   . Shortness of breath dyspnea     Past Surgical History:  Procedure Laterality Date  . ABDOMINAL HYSTERECTOMY  2001  . BACK SURGERY     FOR SCOLIOSIS-HERRINGTON RODS IN PLACE  . BREAST BIOPSY Bilateral    08/17/2015   . BREAST RECONSTRUCTION Bilateral   . PORTACATH PLACEMENT N/A 08/30/2015   Procedure: INSERTION PORT-A-CATH;  Surgeon: Leonie Green, MD;  Location: [facility];  Service: General;  Laterality: N/A;  . SENTINEL NODE BIOPSY Bilateral 03/08/2016   Procedure: SENTINEL NODE BIOPSY;  Surgeon: Leonie Green, MD;  Location: [facility];  Service: General;  Laterality: Bilateral;  .  SIMPLE MASTECTOMY WITH AXILLARY SENTINEL NODE BIOPSY Bilateral 03/08/2016   Procedure: SIMPLE MASTECTOMY;  Surgeon: Leonie Green, MD;  Location: [facility];  Service: General;  Laterality: Bilateral;  . TONSILLECTOMY      There were no vitals filed for this visit.  Subjective Assessment - 09/15/18 0930    Subjective   I did the scar massage some - forgot some times- but did the wall exercises - no issues - we put in new floor in the living room and kitchen - so I have been busy - pull no as bad at the scar or in armpit anymore    Patient Stated Goals  I just want to get my motion back in my R dominant arm and more stregnth so I can work out again like planks, light loose weight and my treadmill     Currently in Pain?  No/denies          Myofascial release to axilla , pect  with shoulder in flexion - gentle with slight pull  Scar mobs done to scar in axilla where ln remove - adhesion better   done this date again  5 times xtractor for scar mobs - pt tolerate well - and more ease   and graston tool on interior upper arm - tightness cont with that  Pt ed on scar massage at home - 2 -3 min - 2 x day      YTB for  Scapula retraction 15 reps  shoulder extention 15 reps Ext rotation 10 reps ( pt need mod A with hand position - wants to UD and extend wrists )  And elbow extention 10-15 reps  1 x day  Can increase if no issue in 4 days to 2 sets                  OT Education - 09/15/18 0932    Education Details  add yellow band HEP and cont with scar massage    Person(s) Educated  Patient    Methods  Demonstration;Explanation;Handout    Comprehension  Verbalized understanding;Returned demonstration          OT Long Term Goals - 09/15/18 0935      OT LONG TERM GOAL #1   Title  Pt to be ind in HEP to increase AROM in R shoulder to WNL with symptoms less than 2/10     Status  Achieved      OT LONG TERM GOAL #2   Title  Pt to be independent in HEP to intiate  strengthening HEP to be able to cont at home with conditioning  and workout routine     Baseline  initate this date strengthening HEP - YTB     Time  2    Period  Weeks    Status  On-going    Target Date  09/26/18            Plan - 09/15/18 0933    Clinical Impression Statement  Pt now about 12 wks s/p - pt AROM in R shoulder motion - WFL and not as much of pull in axilla - pt doing scar massage and done this date some more - add this date some YTB HEP - pt schedule for surgery on 12/20     Occupational performance deficits (Please refer to evaluation for details):  IADL's;Leisure;Play    Rehab Potential  Good    OT Frequency  1x / week    OT Duration  2 weeks    OT Treatment/Interventions  Patient/family education;Passive range of motion;Manual Therapy;Therapeutic exercise    Plan  assess progress and tolerance of YTB HEP     Clinical Decision Making  Limited treatment options, no task modification necessary    OT Home Exercise Plan  see pt instruction     Consulted and Agree with Plan of Care  Patient       Patient will benefit from skilled therapeutic intervention in order to improve the following deficits and impairments:  Impaired flexibility, Impaired UE functional use, Decreased strength  Visit Diagnosis: Stiffness of right shoulder, not elsewhere classified  Scar condition and fibrosis of skin    Problem List Patient Active Problem List   Diagnosis Date Noted  . Fatigue 05/01/2017  . Cellulitis 02/22/2017  . Hypokalemia 12/06/2016  . Hot flashes due to tamoxifen 08/18/2016  . Carcinoma of overlapping sites of right breast in female, estrogen receptor positive ([place]) 07/26/2016  . Loss of weight 12/06/2015  . Peripheral neuropathy due to chemotherapy ([place]) 12/06/2015  . Skin lesion 11/29/2015  . Low serum cortisol level ([place]) 09/19/2015  . Malignant neoplasm of central portion of right female breast ([place]) 09/04/2015  . Anxiety and depression 08/31/2015  .  Insomnia, persistent 08/31/2015    Rosalyn Gess OTR/L,CLT 09/15/2018, 9:36 AM  [facility] [address], [place], [address] Phone: [phone]   Fax:  [phone]  Name: Emma Atkinson MRN: [id number] Date of Birth: October 28, 1965

## 2018-09-15 NOTE — Patient Instructions (Addendum)
Add this date YTB for  Scapula retraction 15 reps  shoulder extention 15 reps Ext rotation 10 reps  And elbow extention 10-15 reps  1 x day  Can increase if no issue in 4 days to 2 sets  Cont scar massage

## 2018-09-22 ENCOUNTER — Ambulatory Visit: Payer: 59 | Admitting: Occupational Therapy

## 2018-09-22 DIAGNOSIS — N651 Disproportion of reconstructed breast: Secondary | ICD-10-CM | POA: Diagnosis not present

## 2018-09-22 DIAGNOSIS — L905 Scar conditions and fibrosis of skin: Secondary | ICD-10-CM

## 2018-09-22 DIAGNOSIS — M25611 Stiffness of right shoulder, not elsewhere classified: Secondary | ICD-10-CM | POA: Diagnosis not present

## 2018-09-22 NOTE — Therapy (Signed)
[facility] [address] [place], [address] Phone: [phone]   Fax:  [phone]  Occupational Therapy Treatment  Patient Details  Name: Emma Atkinson MRN: [id number] Date of Birth: 1966/05/10 Referring Provider (OT): Stevphen Rochester   Encounter Date: 09/22/2018  OT End of Session - 09/22/18 0937    Visit Number  5    Number of Visits  5    Date for OT Re-Evaluation  09/26/18    OT Start Time  0900    OT Stop Time  0935    OT Time Calculation (min)  35 min    Activity Tolerance  Patient tolerated treatment well    Behavior During Therapy  [facility] for tasks assessed/performed       Past Medical History:  Diagnosis Date  . Allergy   . Anemia    H/O  . Anxiety   . Blood transfusion without reported diagnosis   . Breast cancer ([place])    breast  . Chronic kidney disease 12-2015   PYLONEPHRITIS  . Complication of anesthesia    HARD TO WAKE UP AFTER TONSILLECTOMY AS A CHILD BUT NO PROBLEMS SINCE  . Complication of anesthesia    PT STATES THAT BACK IN OR DURING PORT PLACMENT IN 2016 THAT SHE BECAME VEERY FLUSHED, LIGHT HEADED AND CRNA SAID ANTIBIOTIC MAY BE GOING IN IN TO FAST-RATE DECREASED AND PTS SYMPTOMS RESOLVED IMMEDIATELY  . Depression   . GERD (gastroesophageal reflux disease)   . Hypertension   . Shortness of breath dyspnea     Past Surgical History:  Procedure Laterality Date  . ABDOMINAL HYSTERECTOMY  2001  . BACK SURGERY     FOR SCOLIOSIS-HERRINGTON RODS IN PLACE  . BREAST BIOPSY Bilateral    08/17/2015   . BREAST RECONSTRUCTION Bilateral   . PORTACATH PLACEMENT N/A 08/30/2015   Procedure: INSERTION PORT-A-CATH;  Surgeon: Leonie Green, MD;  Location: [facility] ORS;  Service: General;  Laterality: N/A;  . SENTINEL NODE BIOPSY Bilateral 03/08/2016   Procedure: SENTINEL NODE BIOPSY;  Surgeon: Leonie Green, MD;  Location: [facility] ORS;  Service: General;  Laterality: Bilateral;  .  SIMPLE MASTECTOMY WITH AXILLARY SENTINEL NODE BIOPSY Bilateral 03/08/2016   Procedure: SIMPLE MASTECTOMY;  Surgeon: Leonie Green, MD;  Location: [facility] ORS;  Service: General;  Laterality: Bilateral;  . TONSILLECTOMY      There were no vitals filed for this visit.  Subjective Assessment - 09/22/18 0934    Subjective   I did okay with the band exercises - but stopped the rotation exercises - because of my L elbow hurt some - still feeling tightness in axilla -I am having surgery Friday again - mut minor this time     Patient Stated Goals  I just want to get my motion back in my R dominant arm and more stregnth so I can work out again like planks, light loose weight and my treadmill     Currently in Pain?  No/denies        R UE AROM WFL - limited for end range more because of her long standing history of scoliosis and had surgery when she was young    Myofascial release to axilla , pect  with shoulder in flexion - gentle with slight pull  Scar mobs done to scar in axilla where ln remove - adhesion better  done this date again  5times xtractor for scar mobs - pt tolerate well - and more  ease  and graston tool on interior upper arm - tightnes Pt ed on scar massage at home - 2 -3 min - 2 x day     Review  YTB for  Scapula retraction 15 reps  shoulder extention 15 reps  And elbow extention 10-15 reps  1 x day  Pt done 2 sets  Upgrade to RTB - and one set - can increase to 2 sets in 3 days and then again 3 sets prior to surgery next Friday Stabilization on wall - on elbow - modify plank - and can do kick back - but stop if having back pain - 2 sets of 10  Stabilization on wall - rotate and lift elbows off wall  - alternate  2 sets of 10              OT Education - 09/22/18 0936    Education Details  upgrade to RTB and add 2 shoulder stabilization     Person(s) Educated  Patient    Methods  Demonstration;Explanation;Handout    Comprehension  Verbalized  understanding;Returned demonstration          OT Long Term Goals - 09/22/18 0940      OT LONG TERM GOAL #1   Title  Pt to be ind in HEP to increase AROM in R shoulder to WNL with symptoms less than 2/10     Status  Achieved      OT LONG TERM GOAL #2   Title  Pt to be independent in HEP to intiate strengthening HEP to be able to cont at home with conditioning  and workout routine     Baseline  doing RTB HEP and stabilization - but having more surgery on 20th Dec     Status  Partially Met            Plan - 09/22/18 0937    Clinical Impression Statement  Pt show decrease scar tissue - but still adhere at R axilla - and feeling pull - cont with scar massage -and able to upgradeto RTB for shoulder ext, scapula retraction and elbow extention -and then add this date 2 shoulder stabilization HEP- stop if increase pain - pt to have procedure on 20th Dec -lift under L breast and extra skin remove in R axiilla - pt to cont with HEP and stop when having surgery - to contact me in new year when ready to return to OT     Occupational performance deficits (Please refer to evaluation for details):  IADL's;Leisure;Play    Rehab Potential  Good    OT Treatment/Interventions  Patient/family education;Passive range of motion;Manual Therapy;Therapeutic exercise    Plan  Pt having surgery 20th Dec - to contact me in new year when ready to return to OT     Clinical Decision Making  Limited treatment options, no task modification necessary    OT Home Exercise Plan  see pt instruction     Consulted and Agree with Plan of Care  Patient       Patient will benefit from skilled therapeutic intervention in order to improve the following deficits and impairments:  Impaired flexibility, Impaired UE functional use, Decreased strength  Visit Diagnosis: Stiffness of right shoulder, not elsewhere classified  Scar condition and fibrosis of skin    Problem List Patient Active Problem List   Diagnosis Date  Noted  . Fatigue 05/01/2017  . Cellulitis 02/22/2017  . Hypokalemia 12/06/2016  . Hot flashes due to tamoxifen 08/18/2016  .  Carcinoma of overlapping sites of right breast in female, estrogen receptor positive ([place]) 07/26/2016  . Loss of weight 12/06/2015  . Peripheral neuropathy due to chemotherapy ([place]) 12/06/2015  . Skin lesion 11/29/2015  . Low serum cortisol level ([place]) 09/19/2015  . Malignant neoplasm of central portion of right female breast ([place]) 09/04/2015  . Anxiety and depression 08/31/2015  . Insomnia, persistent 08/31/2015    Rosalyn Gess OTR/L,CLT 09/22/2018, 9:41 AM  [facility] [address], [place], [address] Phone: [phone]   Fax:  [phone]  Name: Emma Atkinson MRN: [id number] Date of Birth: [birth year]

## 2018-09-22 NOTE — Patient Instructions (Signed)
Increase HEP to RTB - but stop ext rotation  And then did stabilization with elbow on wall - lift elbow up - alternating  And kick backs - but gentle - not far  10 -15 reps  and every 3 days 2nd set - then 3rd set   Cont scar massage

## 2018-09-23 ENCOUNTER — Encounter: Payer: Self-pay | Admitting: Hematology and Oncology

## 2018-09-23 ENCOUNTER — Other Ambulatory Visit: Payer: Self-pay | Admitting: Hematology and Oncology

## 2018-09-23 ENCOUNTER — Inpatient Hospital Stay: Payer: 59 | Attending: Hematology and Oncology

## 2018-09-23 ENCOUNTER — Inpatient Hospital Stay (HOSPITAL_BASED_OUTPATIENT_CLINIC_OR_DEPARTMENT_OTHER): Payer: 59 | Admitting: Hematology and Oncology

## 2018-09-23 VITALS — BP 135/77 | HR 81 | Temp 98.4°F | Resp 18 | Wt 115.0 lb

## 2018-09-23 DIAGNOSIS — Z803 Family history of malignant neoplasm of breast: Secondary | ICD-10-CM | POA: Diagnosis not present

## 2018-09-23 DIAGNOSIS — C50811 Malignant neoplasm of overlapping sites of right female breast: Secondary | ICD-10-CM

## 2018-09-23 DIAGNOSIS — C50111 Malignant neoplasm of central portion of right female breast: Secondary | ICD-10-CM

## 2018-09-23 DIAGNOSIS — Z9221 Personal history of antineoplastic chemotherapy: Secondary | ICD-10-CM | POA: Insufficient documentation

## 2018-09-23 DIAGNOSIS — Z79810 Long term (current) use of selective estrogen receptor modulators (SERMs): Secondary | ICD-10-CM | POA: Diagnosis not present

## 2018-09-23 DIAGNOSIS — Z8041 Family history of malignant neoplasm of ovary: Secondary | ICD-10-CM

## 2018-09-23 DIAGNOSIS — Z79899 Other long term (current) drug therapy: Secondary | ICD-10-CM | POA: Insufficient documentation

## 2018-09-23 DIAGNOSIS — Z87891 Personal history of nicotine dependence: Secondary | ICD-10-CM

## 2018-09-23 DIAGNOSIS — K5900 Constipation, unspecified: Secondary | ICD-10-CM | POA: Insufficient documentation

## 2018-09-23 DIAGNOSIS — Z170 Estrogen receptor positive status [ER+]: Principal | ICD-10-CM

## 2018-09-23 LAB — CBC WITH DIFFERENTIAL/PLATELET
Abs Immature Granulocytes: 0.02 10*3/uL (ref 0.00–0.07)
BASOS PCT: 0 %
Basophils Absolute: 0 10*3/uL (ref 0.0–0.1)
EOS PCT: 1 %
Eosinophils Absolute: 0.1 10*3/uL (ref 0.0–0.5)
HCT: 38 % (ref 36.0–46.0)
Hemoglobin: 11.9 g/dL — ABNORMAL LOW (ref 12.0–15.0)
Immature Granulocytes: 0 %
Lymphocytes Relative: 14 %
Lymphs Abs: 1.1 10*3/uL (ref 0.7–4.0)
MCH: 29.5 pg (ref 26.0–34.0)
MCHC: 31.3 g/dL (ref 30.0–36.0)
MCV: 94.3 fL (ref 80.0–100.0)
Monocytes Absolute: 0.3 10*3/uL (ref 0.1–1.0)
Monocytes Relative: 4 %
NRBC: 0 % (ref 0.0–0.2)
Neutro Abs: 6.3 10*3/uL (ref 1.7–7.7)
Neutrophils Relative %: 81 %
Platelets: 283 10*3/uL (ref 150–400)
RBC: 4.03 MIL/uL (ref 3.87–5.11)
RDW: 13.3 % (ref 11.5–15.5)
WBC: 7.9 10*3/uL (ref 4.0–10.5)

## 2018-09-23 LAB — COMPREHENSIVE METABOLIC PANEL
ALT: 19 U/L (ref 0–44)
AST: 26 U/L (ref 15–41)
Albumin: 4.1 g/dL (ref 3.5–5.0)
Alkaline Phosphatase: 61 U/L (ref 38–126)
Anion gap: 7 (ref 5–15)
BUN: 13 mg/dL (ref 6–20)
CO2: 27 mmol/L (ref 22–32)
Calcium: 9.5 mg/dL (ref 8.9–10.3)
Chloride: 105 mmol/L (ref 98–111)
Creatinine, Ser: 0.56 mg/dL (ref 0.44–1.00)
GFR calc Af Amer: >60 mL/min (ref >60)
GFR calc non Af Amer: >60 mL/min (ref >60)
Glucose, Bld: 84 mg/dL (ref 70–99)
Potassium: 3.8 mmol/L (ref 3.5–5.1)
Sodium: 139 mmol/L (ref 135–145)
Total Bilirubin: 0.6 mg/dL (ref 0.3–1.2)
Total Protein: 7.1 g/dL (ref 6.5–8.1)

## 2018-09-23 NOTE — Progress Notes (Signed)
Patient wants to discuss changing Tamoxifen.    Patient had lap-flap in June and implants in June.  Will be having surgery again next week for what patient she calls "clean up".

## 2018-09-23 NOTE — Progress Notes (Signed)
[facility] day:  09/23/18   Chief Complaint: Emma Atkinson is a 52 y.o. female with clinical stage IIB Her2/neu+ right breast cancer on tamoxifen who is seen for 6 month assessment.  HPI:  The patient was last seen in the medical oncology on 03/20/2018.  At that time, she felt much better off Effexor.  She was scheduled to undergo latissimus flap surgery in 2 weeks.  CA27.29 was 18.  She underwent right breast reconstruction with latissimus flap and insertion of tissue expander and left breast reconstruction with insertion of tissue expander and removal of right posterior arm skin tag on 04/04/2018 by Dr. Clydene Laming.  She underwent second stage breast reconstruction with exchange and touch up with fat grafting on 06/27/2018.  During the interim, she has done well.  She notes plan for scar revision and left breast left next Friday.  She feels "pretty happy".  She had had physical therapy.  She can now lift her right arm.  She is on tamoxifen.  She came off Effexor.  She notes side effects of hormonal therapy: tearful, hot flashes, and mood swings.   Past Medical History:  Diagnosis Date  . Allergy   . Anemia    H/O  . Anxiety   . Blood transfusion without reported diagnosis   . Breast cancer ([place])    breast  . Chronic kidney disease 12-2015   PYLONEPHRITIS  . Complication of anesthesia    HARD TO WAKE UP AFTER TONSILLECTOMY AS A CHILD BUT NO PROBLEMS SINCE  . Complication of anesthesia    PT STATES THAT BACK IN OR DURING PORT PLACMENT IN 2016 THAT SHE BECAME VEERY FLUSHED, LIGHT HEADED AND CRNA SAID ANTIBIOTIC MAY BE GOING IN IN TO FAST-RATE DECREASED AND PTS SYMPTOMS RESOLVED IMMEDIATELY  . Depression   . GERD (gastroesophageal reflux disease)   . Hypertension   . Shortness of breath dyspnea     Past Surgical History:  Procedure Laterality Date  . ABDOMINAL HYSTERECTOMY  2001  . BACK SURGERY     FOR SCOLIOSIS-HERRINGTON RODS IN  PLACE  . BREAST BIOPSY Bilateral    08/17/2015   . BREAST RECONSTRUCTION Bilateral   . PORTACATH PLACEMENT N/A 08/30/2015   Procedure: INSERTION PORT-A-CATH;  Surgeon: Leonie Green, MD;  Location: [facility] ORS;  Service: General;  Laterality: N/A;  . SENTINEL NODE BIOPSY Bilateral 03/08/2016   Procedure: SENTINEL NODE BIOPSY;  Surgeon: Leonie Green, MD;  Location: [facility] ORS;  Service: General;  Laterality: Bilateral;  . SIMPLE MASTECTOMY WITH AXILLARY SENTINEL NODE BIOPSY Bilateral 03/08/2016   Procedure: SIMPLE MASTECTOMY;  Surgeon: Leonie Green, MD;  Location: [facility] ORS;  Service: General;  Laterality: Bilateral;  . TONSILLECTOMY      Family History  Problem Relation Age of Onset  . Breast cancer Maternal Aunt 64  . Cancer Father   . Cancer Sister   . Cancer Maternal Grandfather   . Colon cancer Neg Hx   . Esophageal cancer Neg Hx   . Rectal cancer Neg Hx   . Stomach cancer Neg Hx     Social History:  reports that she quit smoking about 18 years ago. Her smoking use included cigarettes. She has a 10.00 pack-year smoking history. She has never used smokeless tobacco. She reports that she drinks alcohol. She reports that she does not use drugs.  Her husband states that she drinks 2-4 bottles of wine/week.  She returned to full-time work on  10/24/2016.  Her daughter was married in 01/2017.  She works out on the treadmill 3 days/week.  The patient is alone today.  Allergies:  Allergies  Allergen Reactions  . Ceftin [Cefuroxime Axetil] Rash    Current Medications: Current Outpatient Medications  Medication Sig Dispense Refill  . ALPRAZolam (XANAX) 0.25 MG tablet Take 0.0625 mg by mouth 3 (three) times daily as needed for anxiety.    Marland Kitchen linaclotide (LINZESS) 290 MCG CAPS capsule Take 290 mcg by mouth as needed.    . Multiple Vitamin (MULTIVITAMIN) capsule Take 1 capsule by mouth daily.    . polyethylene glycol powder (GLYCOLAX/MIRALAX) powder Take 1 Container by mouth  daily.    . tamoxifen (NOLVADEX) 20 MG tablet TAKE 1 TABLET BY MOUTH DAILY 30 tablet 5  . Lidocaine 5 % CREA Apply 1 application topically daily. (Patient not taking: Reported on 03/20/2018) 30 g 3   No current facility-administered medications for this visit.     Review of Systems:  GENERAL:  Feels good.  No fevers, sweats.  Weight down 2 pounds. PERFORMANCE STATUS (ECOG): 0 HEENT:  No visual changes, runny nose, sore throat, mouth sores or tenderness. Lungs: No shortness of breath or cough.  No hemoptysis. Cardiac:  No chest pain, palpitations, orthopnea, or PND. GI:  Constipation.  No nausea, vomiting, diarrhea, melena or hematochezia. GU:  No urgency, frequency, dysuria, or hematuria. Musculoskeletal:  No back pain.  No joint pain.  No muscle tenderness. Extremities:  No pain or swelling. Skin:  No rashes or skin changes. Neuro:  No headache, numbness or weakness, balance or coordination issues. Endocrine:  No diabetes or thyroid issues.  Feels better off Effexor. Psych:  No mood changes, depression or anxiety.  "Pretty happy". Pain:  No focal pain. Review of systems:  All other systems reviewed and found to be negative.   Physical Exam: Blood pressure 135/77, pulse 81, temperature 98.4 F (36.9 C), temperature source Tympanic, resp. rate 18, weight 115 lb (52.2 kg). O2 sats 94% with ambulation. GENERAL:  Thin woman sitting comfortably in the exam room in no acute distress. MENTAL STATUS:  Alert and oriented to person, place and time. HEAD:  Long hair.  Normocephalic, atraumatic, face symmetric, no Cushingoid features. EYES:  Blue eyes.  Pupils equal round and reactive to light and accomodation.  No conjunctivitis or scleral icterus. ENT:  Oropharynx clear without lesion.  Tongue normal. Mucous membranes moist.  RESPIRATORY:  Clear to auscultation without rales, wheezes or rhonchi. CARDIOVASCULAR:  Regular rate and rhythm without murmur, rub or gallop. BREAST:  s/p bilateral  mastectomies with reconstruction.  No erythema or nodularity. ABDOMEN:  Soft, non-tender, with active bowel sounds, and no hepatosplenomegaly.  No masses. SKIN:  Tan.  No rashes, ulcers or lesions. EXTREMITIES: No edema, no skin discoloration or tenderness.  No palpable cords. LYMPH NODES: No palpable cervical, supraclavicular, axillary or inguinal adenopathy  NEUROLOGICAL: Unremarkable. PSYCH:  Appropriate.    Appointment on 09/23/2018  Component Date Value Ref Range Status  . Sodium 09/23/2018 139  135 - 145 mmol/L Final  . Potassium 09/23/2018 3.8  3.5 - 5.1 mmol/L Final  . Chloride 09/23/2018 105  98 - 111 mmol/L Final  . CO2 09/23/2018 27  22 - 32 mmol/L Final  . Glucose, Bld 09/23/2018 84  70 - 99 mg/dL Final  . BUN 09/23/2018 13  6 - 20 mg/dL Final  . Creatinine, Ser 09/23/2018 0.56  0.44 - 1.00 mg/dL Final  . Calcium 09/23/2018 9.5  8.9 - 10.3 mg/dL Final  . Total Protein 09/23/2018 7.1  6.5 - 8.1 g/dL Final  . Albumin 09/23/2018 4.1  3.5 - 5.0 g/dL Final  . AST 09/23/2018 26  15 - 41 U/L Final  . ALT 09/23/2018 19  0 - 44 U/L Final  . Alkaline Phosphatase 09/23/2018 61  38 - 126 U/L Final  . Total Bilirubin 09/23/2018 0.6  0.3 - 1.2 mg/dL Final  . GFR calc non Af Amer 09/23/2018 >60  >60 mL/min Final  . GFR calc Af Amer 09/23/2018 >60  >60 mL/min Final  . Anion gap 09/23/2018 7  5 - 15 Final   Performed at [facility], [address] [place], [place] [address]  . WBC 09/23/2018 7.9  4.0 - 10.5 K/uL Final  . RBC 09/23/2018 4.03  3.87 - 5.11 MIL/uL Final  . Hemoglobin 09/23/2018 11.9* 12.0 - 15.0 g/dL Final  . HCT 09/23/2018 38.0  36.0 - 46.0 % Final  . MCV 09/23/2018 94.3  80.0 - 100.0 fL Final  . MCH 09/23/2018 29.5  26.0 - 34.0 pg Final  . MCHC 09/23/2018 31.3  30.0 - 36.0 g/dL Final  . RDW 09/23/2018 13.3  11.5 - 15.5 % Final  . Platelets 09/23/2018 283  150 - 400 K/uL Final  . nRBC 09/23/2018 0.0  0.0 - 0.2 % Final  . Neutrophils Relative % 09/23/2018 81  %  Final  . Neutro Abs 09/23/2018 6.3  1.7 - 7.7 K/uL Final  . Lymphocytes Relative 09/23/2018 14  % Final  . Lymphs Abs 09/23/2018 1.1  0.7 - 4.0 K/uL Final  . Monocytes Relative 09/23/2018 4  % Final  . Monocytes Absolute 09/23/2018 0.3  0.1 - 1.0 K/uL Final  . Eosinophils Relative 09/23/2018 1  % Final  . Eosinophils Absolute 09/23/2018 0.1  0.0 - 0.5 K/uL Final  . Basophils Relative 09/23/2018 0  % Final  . Basophils Absolute 09/23/2018 0.0  0.0 - 0.1 K/uL Final  . Immature Granulocytes 09/23/2018 0  % Final  . Abs Immature Granulocytes 09/23/2018 0.02  0.00 - 0.07 K/uL Final   Performed at [facility], [address] [place], [place] [address]    Assessment:  Emma Atkinson is a 52 y.o. female with clinical stage T2N1 (stage IIB) Her2/neu + right breast cancer s/p neoadjuvant chemotherapy and bilateral mastectomy.  She completed a year of adjuvant Herceptin.  She presented with a minimally painful right breast mass with nipple inversion.  Bilateral mammogram and ultrasound on 08/12/2015 revealed a 4.1 cm right superior breast mass containing pleomorphic microcalcifications. There were several satellite lesions (1.3 cm, 0.9 cm, 0.6 cm) plus a 1.3 cm mass versus complicated cyst and a 0.8 cm cyst.  There was one 5 mm right axillary node with irregular thickened cortex.   Core needle biopsy on 08/17/2015 of the right breast mass revealed a grade 3 invasive mammary carcinoma. Right axillary biopsy confirmed metastatic mammary carcinoma. Tumor was ER positive (1-10%), PR positive (11-50%) and HER-2/neu 3+  PET scan on 08/26/2015 revealed hypermetabolic right breast mass (SUV 11.3) with a hypermetabolic right axillary lymph node and a subtle focus of very faint hypermetabolic activity laterally in the right breast. There was faint hypermetabolic activity at the biopsy site of the left breast (? biopsy related).    CA27.29 has been followed: 19.6 on 08/22/2015, 24.7 on 01/10/2016, 15.5 on  03/23/2016, 16.3 on 12/06/2016, 17.4 on 03/05/2017, 11.7 on 04/25/2017, 15.4 on 07/01/2017, 12.9 on 10/28/2017,  18.0 on 03/20/2018, and 15.0 on 10/03/2018.  She received 4 cycles of AC (09/13/2015 - 11/08/2015) with Neulasta support.  She received 4 cycles of Herceptin and Perjeta (11/29/2015 - 01/31/2016) and 12 weeks of Taxol (11/29/2015 - 02/14/2016).  She tolerated her chemotherapy well.  She developed a grade I neuropathy.  She completed a year of adjuvant Herceptin on 11/15/2016.  She began tamoxifen on 07/05/2016.    She was on neratinib from 12/06/2016 - 01/01/2017.  She had significant diarrhea affecting quality of life.  She has ssues with constipation (baseline on Miralax).  She was on Effexor for vasomotor symptoms (discontinued in 01/2018).  Bilateral mastectomy on 03/08/2016 revealed no residual disease in the right breast.  Five lymph nodes were negative for malignancy.  Pathologic stage was ypT0 ypN0.  Left breast revealed benign intraductal papillomas and cysts adjacent to the biopsy site.  There was no atypia or malignancy.  She underwent reconstruction in 09/2016 or 10/2016 then implant removal in 08/2017 secondary to infection.  She underwent right breast reconstruction with latissimus flap and insertion of tissue expander and left breast reconstruction with insertion of tissue expander and removal of right posterior arm skin tag on 04/04/2018.  She underwent second stage breast reconstruction with exchange and touch up with fat grafting on 06/27/2018.  She received 50.4 Gy right chest wall radiation from 05/07/2016 - 06/25/2016.  She underwent reconstructive surgery on 10/10/2016.  She completed reconstruction on  01/11/2017.  She is s/p partial hysterectomy (ovaries remain) in 2001.  She was premenopausal ([place] 5 and estradiol 271.9) on 08/22/2015.   She is post-menopauasal ([place] 42.4 and estradiol < 5.0) on 10/28/2017.  Echo on 08/26/2015 revealed an ejection fraction of 55-60%.   Echo on 11/25/2015 revealed an EF of 60-65%.  Echo on 02/23/2016 and 05/25/2016 revealed an EF of 55-65%.  Echo on 08/31/2016 revealed an EF of 50-55%.  Echo on 11/29/2016 revealed an EF of 60-65%.  She has a family history of breast and ovarian cancer.  My Risk genetic testing revealed no mutation on 08/22/2015.  She developed a skin infection overlying her right breast implant.  She is s/p a course of Septra.  Symptomatically, she feels good.  She is scheduled for scar revision and left breast lift on 10/03/2018.  Exam is unremarkable.  CA27.29 is normal.  Plan: 1.  Labs today:  CBC with diff, CMP, CA27.29, FSH, estradiol. 2.  Stage IIB Her2/neu + right breast cancer:   Clinically doing well.  Additional plastic surgery planned for 10/03/2018.  Discuss endocrine therapy.  Hysterectomy in 2001.    Confirm postmenopausal state- check FSH and estradiol.  Side effects of aromatase inhibitors discussed.  Discuss baseline bone density study. 3.  Health maintenance:  Bone density.  Call patient with results.  Discuss calcium and vitamin D.  If bone density low, discuss plans for Prolia.  Side effects of Prolia reviewed.  Patient last seen by dentistry on 10/2017.  Plan to switch to AI then f/u CMP 1 month later (to be scheduled at a later date). 4.  RTC in 6 months in [place] for MD assessment and labs (CBC with diff, CMP, CA27.29).   Nolon Stalls, MD 09/23/2018, 11:19 AM

## 2018-09-24 LAB — FOLLICLE STIMULATING HORMONE: FSH: 37.2 m[IU]/mL

## 2018-09-24 LAB — ESTRADIOL: Estradiol: <5 pg/mL

## 2018-09-24 LAB — CANCER ANTIGEN 27.29: CA 27.29: 15 U/mL (ref 0.0–38.6)

## 2018-09-25 ENCOUNTER — Telehealth: Payer: Self-pay | Admitting: Gastroenterology

## 2018-09-25 ENCOUNTER — Ambulatory Visit (AMBULATORY_SURGERY_CENTER): Payer: 59 | Admitting: Gastroenterology

## 2018-09-25 ENCOUNTER — Encounter: Payer: Self-pay | Admitting: Gastroenterology

## 2018-09-25 VITALS — BP 98/56 | HR 70 | Temp 98.4°F | Resp 16 | Ht 60.0 in | Wt 114.0 lb

## 2018-09-25 DIAGNOSIS — D122 Benign neoplasm of ascending colon: Secondary | ICD-10-CM

## 2018-09-25 DIAGNOSIS — Z1211 Encounter for screening for malignant neoplasm of colon: Secondary | ICD-10-CM | POA: Diagnosis not present

## 2018-09-25 DIAGNOSIS — K635 Polyp of colon: Secondary | ICD-10-CM | POA: Diagnosis not present

## 2018-09-25 MED ORDER — SODIUM CHLORIDE 0.9 % IV SOLN: 500.0000 mL | Freq: Once | INTRAVENOUS | Status: DC

## 2018-09-25 NOTE — Op Note (Signed)
[facility] Patient Name: Katelin Kutsch Procedure Date: 09/25/2018 1:57 PM MRN: [id number] Endoscopist: Thornton Park MD, MD Age: 52 Referring MD:  Date of Birth: [birth year] Gender: Female Account #: [id number] Procedure:                Colonoscopy Indications:              Screening for colorectal malignant neoplasm, This                            is the patient's first colonoscopy. No known family                            history of colon cancer or polyps. No baseline GI                            symptoms. Medicines:                See the Anesthesia note for documentation of the                            administered medications Procedure:                Pre-Anesthesia Assessment:                           - Prior to the procedure, a History and Physical                            was performed, and patient medications and                            allergies were reviewed. The patient's tolerance of                            previous anesthesia was also reviewed. The risks                            and benefits of the procedure and the sedation                            options and risks were discussed with the patient.                            All questions were answered, and informed consent                            was obtained. Prior Anticoagulants: The patient has                            taken no previous anticoagulant or antiplatelet                            agents. ASA Grade Assessment: II - A patient with  mild systemic disease. After reviewing the risks                            and benefits, the patient was deemed in                            satisfactory condition to undergo the procedure.                           After obtaining informed consent, the colonoscope                            was passed under direct vision. Throughout the                            procedure, the patient's blood pressure, pulse,  and                            oxygen saturations were monitored continuously. The                            Colonoscope was introduced through the anus and                            advanced to the the terminal ileum, with                            identification of the appendiceal orifice and IC                            valve. The colonoscopy was performed with moderate                            difficulty due to significant looping. Successful                            completion of the procedure was aided by using                            manual pressure. The patient tolerated the                            procedure well. The quality of the bowel                            preparation was excellent. Scope In: 2:15:33 PM Scope Out: 2:32:36 PM Scope Withdrawal Time: 0 hours 10 minutes 34 seconds  Total Procedure Duration: 0 hours 17 minutes 3 seconds  Findings:                 The perianal and digital rectal examinations were                            normal.  Non-bleeding internal hemorrhoids were found.                           A few small and large-mouthed diverticula were                            found in the sigmoid colon, descending colon, and                            ascending colon.                           A 2 mm polyp was found in the proximal ascending                            colon. The polyp was sessile. The polyp was removed                            with a cold biopsy forceps. Resection and retrieval                            were complete.                           The exam was otherwise without abnormality on                            direct and retroflexion views. Complications:            No immediate complications. Estimated Blood Loss:     Estimated blood loss was minimal. Impression:               - Non-bleeding internal hemorrhoids.                           - Mild diverticulosis in the sigmoid colon,                             descending colon, and ascending colon.                           - One 2 mm polyp in the proximal ascending colon,                            removed with a cold biopsy forceps. Resected and                            retrieved.                           - The examination was otherwise normal on direct                            and retroflexion views. Recommendation:           - Discharge patient to home.                           -  Resume regular diet. High fiber diet recommended.                           - Continue present medications.                           - Await pathology results.                           - Repeat colonoscopy in 5 years for surveillance if                            the polyp is adenomatous. Thornton Park MD, MD 09/25/2018 2:42:04 PM This report has been signed electronically.

## 2018-09-25 NOTE — Progress Notes (Signed)
Called to room to assist during endoscopic procedure.  Patient ID and intended procedure confirmed with present staff. Received instructions for my participation in the procedure from the performing physician.  

## 2018-09-25 NOTE — Patient Instructions (Signed)
YOU HAD AN ENDOSCOPIC PROCEDURE TODAY AT THE [facility]:   Refer to the procedure report that was given to you for any specific questions about what was found during the examination.  If the procedure report does not answer your questions, please call your gastroenterologist to clarify.  If you requested that your care partner not be given the details of your procedure findings, then the procedure report has been included in a sealed envelope for you to review at your convenience later.  YOU SHOULD EXPECT: Some feelings of bloating in the abdomen. Passage of more gas than usual.  Walking can help get rid of the air that was put into your GI tract during the procedure and reduce the bloating. If you had a lower endoscopy (such as a colonoscopy or flexible sigmoidoscopy) you may notice spotting of blood in your stool or on the toilet paper. If you underwent a bowel prep for your procedure, you may not have a normal bowel movement for a few days.  Please Note:  You might notice some irritation and congestion in your nose or some drainage.  This is from the oxygen used during your procedure.  There is no need for concern and it should clear up in a day or so.  SYMPTOMS TO REPORT IMMEDIATELY:   Following lower endoscopy (colonoscopy or flexible sigmoidoscopy):  Excessive amounts of blood in the stool  Significant tenderness or worsening of abdominal pains  Swelling of the abdomen that is new, acute  Fever of 100F or higher  For urgent or emergent issues, a gastroenterologist can be reached at any hour by calling [phone].   DIET:  We do recommend a small meal at first, but then you may proceed to your regular diet.  Drink plenty of fluids but you should avoid alcoholic beverages for 24 hours.  ACTIVITY:  You should plan to take it easy for the rest of today and you should NOT DRIVE or use heavy machinery until tomorrow (because of the sedation medicines used during the test).     FOLLOW UP: Our staff will call the number listed on your records the next business day following your procedure to check on you and address any questions or concerns that you may have regarding the information given to you following your procedure. If we do not reach you, we will leave a message.  However, if you are feeling well and you are not experiencing any problems, there is no need to return our call.  We will assume that you have returned to your regular daily activities without incident.  If any biopsies were taken you will be contacted by phone or by letter within the next 1-3 weeks.  Please call us at [phone] if you have not heard about the biopsies in 3 weeks.    SIGNATURES/CONFIDENTIALITY: You and/or your care partner have signed paperwork which will be entered into your electronic medical record.  These signatures attest to the fact that that the information above on your After Visit Summary has been reviewed and is understood.  Full responsibility of the confidentiality of this discharge information lies with you and/or your care-partner. 

## 2018-09-25 NOTE — Progress Notes (Signed)
Pt's states no medical or surgical changes since previsit or office visit. 

## 2018-09-25 NOTE — Telephone Encounter (Signed)
Patient called today in the setting of having a new onset fever as well as cough production of clear mucus. The patient underwent a colonoscopy today with a 2 mm polyp but otherwise no significant findings. It is not clear event during the procedure based on what I can see in the records. However since the patient returned home and after waking up from a nap she has had a increase in her temperature up to 102.0. She is having symptoms of hoarse voice as well as having mucus production clear state currently. Patient was exposed to a person who had the flu earlier last week however she describes not having any symptoms before the procedure. I asked the patient to monitor and take Tylenol 500 mg to 1000 mg x 1.  If he is able to break her fever that he can follow-up with her PCP or urgent care tomorrow to evaluate her further in case she had exposure to the flu or had a bit of aspiration pneumonitis develop. The patient if she has progressive or persistent fevers will take ibuprofen thereafter and if she has persistent or worsening symptoms and she will present to the emergency department for evaluation to ensure that she does not have an aspiration pneumonia or aspiration pneumonitis and she would likely also need rule out for. Message to Dr. Tarri Glenn so that her staff can reach out to the patient tomorrow in follow-up. Patient appreciative for call back and will alert [place] overnight if she ends up having progressive symptoms of shortness of breath (she does not have that currently) or fever is not able to be broken.  Justice Britain, MD [facility] Office # [phone]

## 2018-09-25 NOTE — Progress Notes (Signed)
To PACU, VSS. Report to [email or web address] 

## 2018-09-26 ENCOUNTER — Telehealth: Payer: Self-pay

## 2018-09-26 NOTE — Telephone Encounter (Signed)
I am sorry to hear that she had a fever last night. Please call to see how she is feeling today. Thank you.

## 2018-09-26 NOTE — Telephone Encounter (Signed)
  Follow up Call-  Call back number 09/25/2018  Post procedure Call Back phone  # [phone]  Permission to leave phone message Yes  Some recent data might be hidden     Patient questions:  Do you have a fever, pain , or abdominal swelling? Yes.   Pain Score  0 * Ran fever of 102 throughout the night.  Took Tylenol and now fever down below 100 degrees.  Has symptoms of congestion in her chest and feels she has been exposed to flu.  No GI symptoms.  Have you tolerated food without any problems? Yes.    Have you been able to return to your normal activities? Yes.    Do you have any questions about your discharge instructions: Diet   No. Medications  No. Follow up visit  No.  Do you have questions or concerns about your Care? No.  Actions: * If pain score is 4 or above: No action needed, pain <4.

## 2018-09-26 NOTE — Telephone Encounter (Signed)
Thank you. Dr. Rush Landmark sent me a message when she called.

## 2018-09-26 NOTE — Telephone Encounter (Signed)
Spoke to patient she is feeling a little better. No fever this morning. States her husband also has flu like symptoms. She will follow up with her PCP.

## 2018-09-29 ENCOUNTER — Other Ambulatory Visit: Payer: Self-pay

## 2018-09-30 ENCOUNTER — Encounter: Payer: Self-pay | Admitting: Gastroenterology

## 2018-10-02 ENCOUNTER — Encounter: Payer: Self-pay | Admitting: Gastroenterology

## 2018-10-03 DIAGNOSIS — Z9221 Personal history of antineoplastic chemotherapy: Secondary | ICD-10-CM | POA: Diagnosis not present

## 2018-10-03 DIAGNOSIS — Z923 Personal history of irradiation: Secondary | ICD-10-CM | POA: Diagnosis not present

## 2018-10-03 DIAGNOSIS — K219 Gastro-esophageal reflux disease without esophagitis: Secondary | ICD-10-CM | POA: Diagnosis not present

## 2018-10-03 DIAGNOSIS — Z87891 Personal history of nicotine dependence: Secondary | ICD-10-CM | POA: Diagnosis not present

## 2018-10-03 DIAGNOSIS — C50911 Malignant neoplasm of unspecified site of right female breast: Secondary | ICD-10-CM | POA: Diagnosis not present

## 2018-10-03 DIAGNOSIS — N651 Disproportion of reconstructed breast: Secondary | ICD-10-CM | POA: Diagnosis not present

## 2018-10-03 DIAGNOSIS — Z9013 Acquired absence of bilateral breasts and nipples: Secondary | ICD-10-CM | POA: Diagnosis not present

## 2018-10-03 DIAGNOSIS — Z853 Personal history of malignant neoplasm of breast: Secondary | ICD-10-CM | POA: Diagnosis not present

## 2018-10-20 ENCOUNTER — Encounter: Payer: Self-pay | Admitting: Hematology and Oncology

## 2018-10-24 ENCOUNTER — Other Ambulatory Visit: Payer: Self-pay | Admitting: Urgent Care

## 2018-10-24 DIAGNOSIS — Z170 Estrogen receptor positive status [ER+]: Principal | ICD-10-CM

## 2018-10-24 DIAGNOSIS — C50811 Malignant neoplasm of overlapping sites of right female breast: Secondary | ICD-10-CM

## 2018-10-24 MED ORDER — VENLAFAXINE HCL ER 37.5 MG PO CP24: 37.5000 mg | ORAL_CAPSULE | Freq: Every day | ORAL | 3 refills | Status: DC

## 2018-10-24 MED ORDER — TAMOXIFEN CITRATE 20 MG PO TABS: 20.0000 mg | ORAL_TABLET | Freq: Every day | ORAL | 3 refills | Status: DC

## 2018-10-28 ENCOUNTER — Ambulatory Visit
Admission: RE | Admit: 2018-10-28 | Discharge: 2018-10-28 | Disposition: A | Discharge status: Home / Self Care | Payer: 59 | Source: Ambulatory Visit | Attending: Hematology and Oncology | Admitting: Hematology and Oncology

## 2018-10-28 DIAGNOSIS — Z170 Estrogen receptor positive status [ER+]: Secondary | ICD-10-CM | POA: Diagnosis not present

## 2018-10-28 DIAGNOSIS — C50111 Malignant neoplasm of central portion of right female breast: Secondary | ICD-10-CM | POA: Diagnosis not present

## 2018-10-28 DIAGNOSIS — M8589 Other specified disorders of bone density and structure, multiple sites: Secondary | ICD-10-CM | POA: Diagnosis not present

## 2018-10-28 DIAGNOSIS — Z780 Asymptomatic menopausal state: Secondary | ICD-10-CM | POA: Diagnosis not present

## 2018-12-18 DIAGNOSIS — Z13220 Encounter for screening for lipoid disorders: Secondary | ICD-10-CM | POA: Diagnosis not present

## 2018-12-18 DIAGNOSIS — R61 Generalized hyperhidrosis: Secondary | ICD-10-CM | POA: Diagnosis not present

## 2018-12-18 DIAGNOSIS — F419 Anxiety disorder, unspecified: Secondary | ICD-10-CM | POA: Diagnosis not present

## 2018-12-18 DIAGNOSIS — R7309 Other abnormal glucose: Secondary | ICD-10-CM | POA: Diagnosis not present

## 2018-12-18 DIAGNOSIS — Z79899 Other long term (current) drug therapy: Secondary | ICD-10-CM | POA: Diagnosis not present

## 2018-12-18 DIAGNOSIS — Z0000 Encounter for general adult medical examination without abnormal findings: Secondary | ICD-10-CM | POA: Diagnosis not present

## 2018-12-18 DIAGNOSIS — C50911 Malignant neoplasm of unspecified site of right female breast: Secondary | ICD-10-CM | POA: Diagnosis not present

## 2018-12-18 DIAGNOSIS — C50919 Malignant neoplasm of unspecified site of unspecified female breast: Secondary | ICD-10-CM | POA: Diagnosis not present

## 2018-12-30 ENCOUNTER — Encounter: Payer: Self-pay | Admitting: Hematology and Oncology

## 2019-01-01 ENCOUNTER — Ambulatory Visit: Payer: 59 | Admitting: Radiation Oncology

## 2019-01-15 MED FILL — VENLAFAXINE HCL ER 37.5 MG: 37.5 | 30 days supply | Qty: 30 | Fill #0

## 2019-01-28 ENCOUNTER — Ambulatory Visit: Payer: 59 | Admitting: Radiation Oncology

## 2019-02-09 ENCOUNTER — Other Ambulatory Visit: Payer: Self-pay | Admitting: *Deleted

## 2019-02-09 MED ORDER — VENLAFAXINE HCL ER 37.5 MG PO CP24: 37.5000 mg | ORAL_CAPSULE | Freq: Every day | ORAL | 1 refills | Status: DC

## 2019-02-09 MED FILL — VENLAFAXINE HCL ER 37.5 MG: 37.5 | 90 days supply | Qty: 90 | Fill #0 | Status: TO

## 2019-02-19 DIAGNOSIS — Z01419 Encounter for gynecological examination (general) (routine) without abnormal findings: Secondary | ICD-10-CM | POA: Diagnosis not present

## 2019-02-19 DIAGNOSIS — N952 Postmenopausal atrophic vaginitis: Secondary | ICD-10-CM | POA: Diagnosis not present

## 2019-03-02 ENCOUNTER — Other Ambulatory Visit: Payer: Self-pay

## 2019-03-03 ENCOUNTER — Other Ambulatory Visit: Payer: Self-pay

## 2019-03-03 ENCOUNTER — Ambulatory Visit
Admission: RE | Admit: 2019-03-03 | Discharge: 2019-03-03 | Disposition: A | Discharge status: Home / Self Care | Payer: 59 | Source: Ambulatory Visit | Attending: Radiation Oncology | Admitting: Radiation Oncology

## 2019-03-03 ENCOUNTER — Encounter: Payer: Self-pay | Admitting: Radiation Oncology

## 2019-03-03 VITALS — BP 146/81 | HR 91 | Temp 97.0°F | Resp 18 | Wt 113.9 lb

## 2019-03-03 DIAGNOSIS — C50111 Malignant neoplasm of central portion of right female breast: Secondary | ICD-10-CM | POA: Diagnosis not present

## 2019-03-03 DIAGNOSIS — Z9013 Acquired absence of bilateral breasts and nipples: Secondary | ICD-10-CM | POA: Insufficient documentation

## 2019-03-03 DIAGNOSIS — Z9221 Personal history of antineoplastic chemotherapy: Secondary | ICD-10-CM | POA: Diagnosis not present

## 2019-03-03 DIAGNOSIS — Z79810 Long term (current) use of selective estrogen receptor modulators (SERMs): Secondary | ICD-10-CM | POA: Diagnosis not present

## 2019-03-03 DIAGNOSIS — Z170 Estrogen receptor positive status [ER+]: Secondary | ICD-10-CM | POA: Diagnosis not present

## 2019-03-03 NOTE — Progress Notes (Signed)
Radiation Oncology Follow up Note  Name: Emma Atkinson   Date:   03/03/2019 MRN:  [id number] DOB: 06/07/1966    This 53 y.o. female presents to the clinic today for 2-1/2-year follow-up status post radiation therapy to right chest wall peripheral emphatic for stage IIIb invasive mammary carcinoma.  REFERRING PROVIDER: Idelle Crouch, MD  HPI: Patient is a 53 year old female now about 2-1/2 years having completed radiation therapy to her right chest wall and peripheral emphatic's per stage IIIb (T4 N1 M0) grade 3 invasive mammary carcinoma triple positive.  She had neoadjuvant chemotherapy followed by bilateral mastectomies.  She has completed breast reconstruction bilaterally.  She is doing well.  She specifically denies any chest wall pain or discomfort cough or bone pain..  COMPLICATIONS OF TREATMENT: none  FOLLOW UP COMPLIANCE: keeps appointments   PHYSICAL EXAM:  BP (!) 146/81   Pulse 91   Temp (!) 97 F (36.1 C)   Resp 18   Wt 113 lb 13.9 oz (51.6 kg)   BMI 22.24 kg/m  Patient is status post bilateral breast reconstruction.  No evidence of chest wall mass or nodularity bilaterally is noted.  No axillary or supraclavicular adenopathy is identified.  Well-developed well-nourished patient in NAD. HEENT reveals PERLA, EOMI, discs not visualized.  Oral cavity is clear. No oral mucosal lesions are identified. Neck is clear without evidence of cervical or supraclavicular adenopathy. Lungs are clear to A&P. Cardiac examination is essentially unremarkable with regular rate and rhythm without murmur rub or thrill. Abdomen is benign with no organomegaly or masses noted. Motor sensory and DTR levels are equal and symmetric in the upper and lower extremities. Cranial nerves II through XII are grossly intact. Proprioception is intact. No peripheral adenopathy or edema is identified. No motor or sensory levels are noted. Crude visual fields are within normal range.  RADIOLOGY RESULTS: No current  films for review patient is no longer having mammograms.  PLAN: Present time patient continues to do well with no evidence of disease.  She is currently on tamoxifen tolerating it well without side effect.  Patient at some point may benefit from a CT scan of the chest.  She continues close follow-up care with medical oncology.  Patient knows to call with any concerns at any time.  I have asked to see her back in 1 year for follow-up.  I would like to take this opportunity to thank you for allowing me to participate in the care of your patient.Noreene Filbert, MD

## 2019-03-24 ENCOUNTER — Other Ambulatory Visit: Payer: Self-pay

## 2019-03-24 ENCOUNTER — Ambulatory Visit: Payer: Self-pay | Admitting: Hematology and Oncology

## 2019-04-08 DIAGNOSIS — Z03818 Encounter for observation for suspected exposure to other biological agents ruled out: Secondary | ICD-10-CM | POA: Diagnosis not present

## 2019-05-04 ENCOUNTER — Other Ambulatory Visit: Payer: 59

## 2019-05-04 ENCOUNTER — Ambulatory Visit: Payer: 59 | Admitting: Hematology and Oncology

## 2019-05-05 NOTE — Progress Notes (Signed)
[facility]  [address] [place], [place] [address] Phone: [phone]  Fax: [phone]   Clinic Day:  05/06/2019  Referring physician: Idelle Crouch, MD  Chief Complaint: Emma Atkinson is a 53 y.o. female with clinical stage IIB Her2/neu+ right breast cancer on tamoxifen who is seen for 7 month assessment.  HPI: The patient was last seen in the medical oncology clinic on 09/23/2018. At that time, she felt good. She was scheduled for scar revision and left breast lift on 10/03/2018.  Exam was unremarkable.  CA27.29 was normal.  She underwent a scar revision and left breast lift on 10/03/2018 at [facility] with Dr. Blanchie Serve.   Colonoscopy on 09/25/2018 revealed non-bleeding internal hemorrhoids. There was mild diverticulosis in the sigmoid colon, descending colon, and ascending colon. There was one 2 mm polyp in the proximal ascending colon. The examination was otherwise normal. Pathology revealed a sessile serrated polyp, no high grade dysplasia or malignancy.   Bone density on 10/28/2018 revealed osteopenia with a T-score of -1.1 at the femur left neck. She began calcium and vitamin D supplements.   During the interim, she is doing "great." She notes increased fatigue recently, where she "hits a wall" every afternoon and feels she could sleep all day. 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 She has not been exercising as much because she is tired when she gets home from work. She reports she is "a little bit" depressed, but attributes this to everything going on in the world right now.    She continues on tamoxifen. Her PCP started her on remifemin for hot flashes and she has noticed significant improvement. She is also on Effexor, but reports the increased dose made her fatigued.    Past Medical History:  Diagnosis Date  . Allergy   . Anemia    H/O  . Anxiety   . Blood transfusion without reported  diagnosis   . Breast cancer ([place])    breast  . Chronic kidney disease 12-2015   PYLONEPHRITIS  . Complication of anesthesia    HARD TO WAKE UP AFTER TONSILLECTOMY AS A CHILD BUT NO PROBLEMS SINCE  . Complication of anesthesia    PT STATES THAT BACK IN OR DURING PORT PLACMENT IN 2016 THAT SHE BECAME VEERY FLUSHED, LIGHT HEADED AND CRNA SAID ANTIBIOTIC MAY BE GOING IN IN TO FAST-RATE DECREASED AND PTS SYMPTOMS RESOLVED IMMEDIATELY  . Depression   . GERD (gastroesophageal reflux disease)   . Hypertension   . Shortness of breath dyspnea     Past Surgical History:  Procedure Laterality Date  . ABDOMINAL HYSTERECTOMY  2001  . BACK SURGERY     FOR SCOLIOSIS-HERRINGTON RODS IN PLACE  . BREAST BIOPSY Bilateral    08/17/2015   . BREAST RECONSTRUCTION Bilateral   . PORTACATH PLACEMENT N/A 08/30/2015   Procedure: INSERTION PORT-A-CATH;  Surgeon: Leonie Green, MD;  Location: [facility] ORS;  Service: General;  Laterality: N/A;  . SENTINEL NODE BIOPSY Bilateral 03/08/2016   Procedure: SENTINEL NODE BIOPSY;  Surgeon: Leonie Green, MD;  Location: [facility] ORS;  Service: General;  Laterality: Bilateral;  . SIMPLE MASTECTOMY WITH AXILLARY SENTINEL NODE BIOPSY Bilateral 03/08/2016   Procedure: SIMPLE MASTECTOMY;  Surgeon: Leonie Green, MD;  Location: [facility] ORS;  Service: General;  Laterality: Bilateral;  . TONSILLECTOMY      Family History  Problem Relation Age of Onset  . Breast cancer  Maternal Aunt 64  . Cancer Father   . Cancer Sister   . Cancer Maternal Grandfather   . Colon cancer Neg Hx   . Esophageal cancer Neg Hx   . Rectal cancer Neg Hx   . Stomach cancer Neg Hx     Social History:  reports that she quit smoking about 19 years ago. Her smoking use included cigarettes. She has a 10.00 pack-year smoking history. She has never used smokeless tobacco. She reports current alcohol use. She reports that she does not use drugs. Her husband states that she drinks 2-4 bottles of  wine/week.  She returned to full-time work on 10/24/2016.  Her daughter was married in 01/2017. Her daughter will have her first grandchild in the next few weeks. She works out on the treadmill 3 days/week. The patient is alone today.  Allergies:  Allergies  Allergen Reactions  . Ceftin [Cefuroxime Axetil] Rash    Current Medications: Current Outpatient Medications  Medication Sig Dispense Refill  . ALPRAZolam (XANAX) 0.25 MG tablet Take 0.0625 mg by mouth 3 (three) times daily as needed for anxiety.    . Black Cohosh 20 MG TABS Take 1 tablet by mouth 2 (two) times a day.     . Calcium Carbonate-Vitamin D3 600-400 MG-UNIT TABS Take 1 tablet by mouth daily.     Marland Kitchen linaclotide (LINZESS) 290 MCG CAPS capsule Take 290 mcg by mouth as needed.    . Multiple Vitamin (MULTIVITAMIN) capsule Take 1 capsule by mouth daily.    . polyethylene glycol powder (GLYCOLAX/MIRALAX) powder Take 1 Container by mouth daily.    . tamoxifen (NOLVADEX) 20 MG tablet Take 1 tablet (20 mg total) by mouth daily. 90 tablet 3  . venlafaxine XR (EFFEXOR-XR) 37.5 MG 24 hr capsule Take 1 capsule (37.5 mg total) by mouth daily with breakfast. 90 capsule 1   No current facility-administered medications for this visit.     Review of Systems  Constitutional: Positive for diaphoresis (hot flashes, improved), malaise/fatigue and weight loss (6lbs). Negative for chills and fever.       Feels "great."  HENT: Negative.  Negative for congestion, hearing loss, sinus pain and sore throat.   Eyes: Negative.  Negative for blurred vision.  Respiratory: Negative.  Negative for cough, shortness of breath and wheezing.   Cardiovascular: Negative.  Negative for chest pain, palpitations, orthopnea, leg swelling and PND.  Gastrointestinal: Negative.  Negative for abdominal pain, blood in stool, constipation, diarrhea, melena, nausea and vomiting.  Genitourinary: Negative.  Negative for dysuria, frequency, hematuria and urgency.   Musculoskeletal: Negative.  Negative for back pain, joint pain and myalgias.  Skin: Negative.  Negative for rash.  Neurological: Negative.  Negative for dizziness, tingling, sensory change, weakness and headaches.  Endo/Heme/Allergies: Negative.  Does not bruise/bleed easily.  Psychiatric/Behavioral: Negative.  Negative for depression, memory loss and substance abuse. The patient is not nervous/anxious and does not have insomnia.   All other systems reviewed and are negative.  Performance status (ECOG): 1  Vitals Blood pressure 134/83, pulse 84, temperature 98 F (36.7 C), temperature source Tympanic, resp. rate 16, weight 108 lb 5.7 oz (49.2 kg), SpO2 100 %.   Physical Exam  Constitutional: She is oriented to person, place, and time. She appears well-developed and well-nourished. No distress.  HENT:  Head: Normocephalic and atraumatic.  Mouth/Throat: Oropharynx is clear and moist. No oropharyngeal exudate.  Long blonde hair. Mask.  Eyes: Pupils are equal, round, and reactive to light. Conjunctivae and EOM are  normal. No scleral icterus.  Blue eyes.   Neck: Normal range of motion. Neck supple.  Cardiovascular: Normal rate, regular rhythm and normal heart sounds.  No murmur heard. Pulmonary/Chest: Effort normal and breath sounds normal. No respiratory distress. She has no wheezes. Right breast exhibits no mass, no skin change and no tenderness. Left breast exhibits no mass, no skin change and no tenderness. Breasts are symmetrical (s/p bilateral mastectomies with reconstruction).  Abdominal: Soft. Bowel sounds are normal. She exhibits no distension. There is no abdominal tenderness.  Musculoskeletal: Normal range of motion.        General: No edema.  Lymphadenopathy:    She has no cervical adenopathy.    She has no axillary adenopathy.       Right: No supraclavicular adenopathy present.       Left: No supraclavicular adenopathy present.  Neurological: She is alert and oriented to  person, place, and time.  Skin: Skin is warm and dry. She is not diaphoretic. No erythema.  Psychiatric: She has a normal mood and affect. Her behavior is normal. Judgment and thought content normal.  Nursing note and vitals reviewed.   Appointment on 05/06/2019  Component Date Value Ref Range Status  . Sodium 05/06/2019 137  135 - 145 mmol/L Final  . Potassium 05/06/2019 3.9  3.5 - 5.1 mmol/L Final  . Chloride 05/06/2019 105  98 - 111 mmol/L Final  . CO2 05/06/2019 21* 22 - 32 mmol/L Final  . Glucose, Bld 05/06/2019 100* 70 - 99 mg/dL Final  . BUN 05/06/2019 16  6 - 20 mg/dL Final  . Creatinine, Ser 05/06/2019 0.71  0.44 - 1.00 mg/dL Final  . Calcium 05/06/2019 9.1  8.9 - 10.3 mg/dL Final  . Total Protein 05/06/2019 7.3  6.5 - 8.1 g/dL Final  . Albumin 05/06/2019 4.1  3.5 - 5.0 g/dL Final  . AST 05/06/2019 24  15 - 41 U/L Final  . ALT 05/06/2019 22  0 - 44 U/L Final  . Alkaline Phosphatase 05/06/2019 61  38 - 126 U/L Final  . Total Bilirubin 05/06/2019 0.6  0.3 - 1.2 mg/dL Final  . GFR calc non Af Amer 05/06/2019 >60  >60 mL/min Final  . GFR calc Af Amer 05/06/2019 >60  >60 mL/min Final  . Anion gap 05/06/2019 11  5 - 15 Final   Performed at [facility], [address] [place], [place] [address]  . WBC 05/06/2019 7.6  4.0 - 10.5 K/uL Final  . RBC 05/06/2019 4.07  3.87 - 5.11 MIL/uL Final  . Hemoglobin 05/06/2019 12.6  12.0 - 15.0 g/dL Final  . HCT 05/06/2019 39.1  36.0 - 46.0 % Final  . MCV 05/06/2019 96.1  80.0 - 100.0 fL Final  . MCH 05/06/2019 31.0  26.0 - 34.0 pg Final  . MCHC 05/06/2019 32.2  30.0 - 36.0 g/dL Final  . RDW 05/06/2019 13.1  11.5 - 15.5 % Final  . Platelets 05/06/2019 292  150 - 400 K/uL Final  . nRBC 05/06/2019 0.0  0.0 - 0.2 % Final  . Neutrophils Relative % 05/06/2019 75  % Final  . Neutro Abs 05/06/2019 5.7  1.7 - 7.7 K/uL Final  . Lymphocytes Relative 05/06/2019 19  % Final  . Lymphs Abs 05/06/2019 1.5  0.7 - 4.0 K/uL Final  . Monocytes  Relative 05/06/2019 5  % Final  . Monocytes Absolute 05/06/2019 0.4  0.1 - 1.0 K/uL Final  . Eosinophils Relative 05/06/2019 1  % Final  .  Eosinophils Absolute 05/06/2019 0.1  0.0 - 0.5 K/uL Final  . Basophils Relative 05/06/2019 0  % Final  . Basophils Absolute 05/06/2019 0.0  0.0 - 0.1 K/uL Final  . Immature Granulocytes 05/06/2019 0  % Final  . Abs Immature Granulocytes 05/06/2019 0.01  0.00 - 0.07 K/uL Final   Performed at [facility], [address] [place], [place] [address]    Assessment:  Emma Atkinson is a 53 y.o. female with clinical stage T2N1 (stage IIB) Her2/neu + right breast cancer s/p neoadjuvant chemotherapy and bilateral mastectomy.  She completed a year of adjuvant Herceptin.  She presented with a minimally painful right breast mass with nipple inversion.  Bilateral mammogram and ultrasound on 08/12/2015 revealed a 4.1 cm right superior breast mass containing pleomorphic microcalcifications. There were several satellite lesions (1.3 cm, 0.9 cm, 0.6 cm) plus a 1.3 cm mass versus complicated cyst and a 0.8 cm cyst.  There was one 5 mm right axillary node with irregular thickened cortex.   Core needle biopsy on 08/17/2015 of the right breast mass revealed a grade 3 invasive mammary carcinoma. Right axillary biopsy confirmed metastatic mammary carcinoma. Tumor was ER positive (1-10%), PR positive (11-50%) and HER-2/neu 3+  PET scan on 08/26/2015 revealed hypermetabolic right breast mass (SUV 11.3) with a hypermetabolic right axillary lymph node and a subtle focus of very faint hypermetabolic activity laterally in the right breast. There was faint hypermetabolic activity at the biopsy site of the left breast (? biopsy related).    CA27.29 has been followed: 19.6 on 08/22/2015, 24.7 on 01/10/2016, 15.5 on 03/23/2016, 16.3 on 12/06/2016, 17.4 on 03/05/2017, 11.7 on 04/25/2017, 15.4 on 07/01/2017, 12.9 on 10/28/2017, 18.0 on 03/20/2018, and 15.0 on 09/23/2018.  She  received 4 cycles of AC (09/13/2015 - 11/08/2015) with Neulasta support.  She received 4 cycles of Herceptin and Perjeta (11/29/2015 - 01/31/2016) and 12 weeks of Taxol (11/29/2015 - 02/14/2016).  She tolerated her chemotherapy well.  She developed a grade I neuropathy.  She completed a year of adjuvant Herceptin on 11/15/2016.  She began tamoxifen on 07/05/2016.    She was on neratinib from 12/06/2016 - 01/01/2017.  She had significant diarrhea affecting quality of life.  She has ssues with constipation (baseline on Miralax).  She was on Effexor for vasomotor symptoms (discontinued in 01/2018).  Bilateral mastectomy on 03/08/2016 revealed no residual disease in the right breast.  Five lymph nodes were negative for malignancy.  Pathologic stage was ypT0 ypN0.  Left breast revealed benign intraductal papillomas and cysts adjacent to the biopsy site.  There was no atypia or malignancy.  She underwent reconstruction in 09/2016 or 10/2016 then implant removal in 08/2017 secondary to infection.    She underwent right breast reconstruction with latissimus flap and insertion of tissue expander and left breast reconstruction with insertion of tissue expander and removal of right posterior arm skin tag on 04/04/2018.  She underwent second stage breast reconstruction with exchange and touch up with fat grafting on 06/27/2018. She underwent a scar revision and left breast lift on 10/03/2018  She received 50.4 Gy right chest wall radiation from 05/07/2016 - 06/25/2016.  She underwent reconstructive surgery on 10/10/2016.  She completed reconstruction on  01/11/2017.  She began tamoxifen on 07/05/2016.  She is s/p partial hysterectomy (ovaries remain) in 2001.  She was premenopausal ([place] 5 and estradiol 271.9) on 08/22/2015.   She is post-menopauasal ([place] 42.4 and estradiol < 5.0) on 10/28/2017.  Echo on 08/26/2015 revealed  an ejection fraction of 55-60%.  Echo on 11/25/2015 revealed an EF of 60-65%.  Echo on  02/23/2016 and 05/25/2016 revealed an EF of 55-65%.  Echo on 08/31/2016 revealed an EF of 50-55%.  Echo on 11/29/2016 revealed an EF of 60-65%.  She has a family history of breast and ovarian cancer.  My Risk genetic testing revealed no mutation on 08/22/2015.  Colonoscopy on 09/25/2018 revealed mild diverticulosis in the sigmoid colon, descending colon, and ascending colon. There was one 2 mm polyp in the proximal ascending colon (sessile serrated polyp: no high grade dysplasia or malignancy).   Bone density on 10/28/2018 revealed osteopenia with a T-score of -1.1 at the femur left neck.   Symptomatically, she is doing well.  She gets extremely tired in the evenings.  Exam is unremarkable.  Plan: 1.  Labs today:  CBC with diff, CMP, CA27.29, TSH, free T4.  2.  Stage IIB Her2/neu + right breast cancer             Clinically, she is doing well.              Exam reveals no evidence of recurrent disease.    She continues tamoxifen. 3.  Osteopenia             Review interval bone density.    She has mild osteopenia.  Continue calcium and vitamin D.             Continue to monitor bone density every 2 years. 4.    Fatigue   Etiology unknown.    Check TSH and free T4.   5.   RTC in 6 months for MD assessment and labs (CBC with diff, CMP, CA27.29)   I discussed the assessment and treatment plan with the patient.  The patient was provided an opportunity to ask questions and all were answered.  The patient agreed with the plan and demonstrated an understanding of the instructions.  The patient was advised to call back if the symptoms worsen or if the condition fails to improve as anticipated.  I provided 15 minutes of face-to-face time during this this encounter and > 50% was spent counseling as documented under my assessment and plan.    Lequita Asal, MD, PhD    05/06/2019, 9:47 AM  I, Cloyde Reams Dorshimer, am acting as Education administrator for [employer]. Mike Gip, MD, PhD.  I, Melissa C. Mike Gip,  MD, have reviewed the above documentation for accuracy and completeness, and I agree with the above.

## 2019-05-06 ENCOUNTER — Inpatient Hospital Stay (HOSPITAL_BASED_OUTPATIENT_CLINIC_OR_DEPARTMENT_OTHER): Payer: 59 | Admitting: Hematology and Oncology

## 2019-05-06 ENCOUNTER — Telehealth: Payer: Self-pay

## 2019-05-06 ENCOUNTER — Encounter: Payer: Self-pay | Admitting: Hematology and Oncology

## 2019-05-06 ENCOUNTER — Other Ambulatory Visit: Payer: 59

## 2019-05-06 ENCOUNTER — Inpatient Hospital Stay: Payer: 59 | Attending: Hematology and Oncology

## 2019-05-06 ENCOUNTER — Ambulatory Visit: Payer: 59 | Admitting: Hematology and Oncology

## 2019-05-06 ENCOUNTER — Other Ambulatory Visit: Payer: Self-pay

## 2019-05-06 VITALS — BP 134/83 | HR 84 | Temp 98.0°F | Resp 16 | Wt 108.4 lb

## 2019-05-06 DIAGNOSIS — C50111 Malignant neoplasm of central portion of right female breast: Secondary | ICD-10-CM

## 2019-05-06 DIAGNOSIS — R5383 Other fatigue: Secondary | ICD-10-CM | POA: Insufficient documentation

## 2019-05-06 DIAGNOSIS — Z87891 Personal history of nicotine dependence: Secondary | ICD-10-CM | POA: Insufficient documentation

## 2019-05-06 DIAGNOSIS — Z79899 Other long term (current) drug therapy: Secondary | ICD-10-CM | POA: Insufficient documentation

## 2019-05-06 DIAGNOSIS — Z170 Estrogen receptor positive status [ER+]: Secondary | ICD-10-CM | POA: Insufficient documentation

## 2019-05-06 DIAGNOSIS — F329 Major depressive disorder, single episode, unspecified: Secondary | ICD-10-CM

## 2019-05-06 DIAGNOSIS — Z803 Family history of malignant neoplasm of breast: Secondary | ICD-10-CM

## 2019-05-06 DIAGNOSIS — I129 Hypertensive chronic kidney disease with stage 1 through stage 4 chronic kidney disease, or unspecified chronic kidney disease: Secondary | ICD-10-CM | POA: Diagnosis not present

## 2019-05-06 DIAGNOSIS — K219 Gastro-esophageal reflux disease without esophagitis: Secondary | ICD-10-CM | POA: Diagnosis not present

## 2019-05-06 DIAGNOSIS — Z8041 Family history of malignant neoplasm of ovary: Secondary | ICD-10-CM | POA: Insufficient documentation

## 2019-05-06 DIAGNOSIS — Z79810 Long term (current) use of selective estrogen receptor modulators (SERMs): Secondary | ICD-10-CM | POA: Insufficient documentation

## 2019-05-06 DIAGNOSIS — F419 Anxiety disorder, unspecified: Secondary | ICD-10-CM

## 2019-05-06 DIAGNOSIS — N189 Chronic kidney disease, unspecified: Secondary | ICD-10-CM

## 2019-05-06 DIAGNOSIS — M85852 Other specified disorders of bone density and structure, left thigh: Secondary | ICD-10-CM | POA: Insufficient documentation

## 2019-05-06 DIAGNOSIS — M8580 Other specified disorders of bone density and structure, unspecified site: Secondary | ICD-10-CM | POA: Insufficient documentation

## 2019-05-06 LAB — CBC WITH DIFFERENTIAL/PLATELET
Abs Immature Granulocytes: 0.01 K/uL (ref 0.00–0.07)
Basophils Absolute: 0 K/uL (ref 0.0–0.1)
Basophils Relative: 0 %
Eosinophils Absolute: 0.1 K/uL (ref 0.0–0.5)
Eosinophils Relative: 1 %
HCT: 39.1 % (ref 36.0–46.0)
Hemoglobin: 12.6 g/dL (ref 12.0–15.0)
Immature Granulocytes: 0 %
Lymphocytes Relative: 19 %
Lymphs Abs: 1.5 K/uL (ref 0.7–4.0)
MCH: 31 pg (ref 26.0–34.0)
MCHC: 32.2 g/dL (ref 30.0–36.0)
MCV: 96.1 fL (ref 80.0–100.0)
Monocytes Absolute: 0.4 K/uL (ref 0.1–1.0)
Monocytes Relative: 5 %
Neutro Abs: 5.7 K/uL (ref 1.7–7.7)
Neutrophils Relative %: 75 %
Platelets: 292 K/uL (ref 150–400)
RBC: 4.07 MIL/uL (ref 3.87–5.11)
RDW: 13.1 % (ref 11.5–15.5)
WBC: 7.6 K/uL (ref 4.0–10.5)
nRBC: 0 % (ref 0.0–0.2)

## 2019-05-06 LAB — COMPREHENSIVE METABOLIC PANEL WITH GFR
ALT: 22 U/L (ref 0–44)
AST: 24 U/L (ref 15–41)
Albumin: 4.1 g/dL (ref 3.5–5.0)
Alkaline Phosphatase: 61 U/L (ref 38–126)
Anion gap: 11 (ref 5–15)
BUN: 16 mg/dL (ref 6–20)
CO2: 21 mmol/L — ABNORMAL LOW (ref 22–32)
Calcium: 9.1 mg/dL (ref 8.9–10.3)
Chloride: 105 mmol/L (ref 98–111)
Creatinine, Ser: 0.71 mg/dL (ref 0.44–1.00)
GFR calc Af Amer: >60 mL/min
GFR calc non Af Amer: >60 mL/min
Glucose, Bld: 100 mg/dL — ABNORMAL HIGH (ref 70–99)
Potassium: 3.9 mmol/L (ref 3.5–5.1)
Sodium: 137 mmol/L (ref 135–145)
Total Bilirubin: 0.6 mg/dL (ref 0.3–1.2)
Total Protein: 7.3 g/dL (ref 6.5–8.1)

## 2019-05-06 LAB — TSH: TSH: 2.688 u[IU]/mL (ref 0.350–4.500)

## 2019-05-06 LAB — T4, FREE: Free T4: 0.8 ng/dL (ref 0.61–1.12)

## 2019-05-06 NOTE — Telephone Encounter (Signed)
-----   Message from Lequita Asal, MD sent at 05/06/2019  3:13 PM EDT ----- Regarding: Please call patient- thyroid function tests are normal  ----- Message ----- From: Interface, Lab In [place] Sent: 05/06/2019   9:17 AM EDT To: Lequita Asal, MD

## 2019-05-06 NOTE — Telephone Encounter (Signed)
Informed patient of normal thyroid results. Patient verbalizes understanding and denies any further questions or concerns.

## 2019-05-06 NOTE — Progress Notes (Signed)
Pt here for follow up. Patient reports increase in fatigue x 2-3 months.

## 2019-05-07 LAB — CANCER ANTIGEN 27.29: CA 27.29: 11.4 U/mL (ref 0.0–38.6)

## 2019-05-11 ENCOUNTER — Encounter: Payer: Self-pay | Admitting: Hematology and Oncology

## 2019-05-11 ENCOUNTER — Other Ambulatory Visit: Payer: Self-pay

## 2019-05-11 NOTE — Progress Notes (Unsigned)
eff

## 2019-07-20 ENCOUNTER — Encounter: Payer: Self-pay | Admitting: Hematology and Oncology

## 2019-07-21 ENCOUNTER — Encounter: Payer: Self-pay | Admitting: Hematology and Oncology

## 2019-07-21 NOTE — Progress Notes (Signed)
[facility]  [address], [address] [place], [place] [address] Phone: [phone]  Fax: [phone]   Clinic Day:  07/22/2019  Referring physician: Idelle Crouch, MD  Chief Complaint: Emma Atkinson is a 53 y.o. female with clinical stage IIB Her2/neu+ right breast cancer who is seen for 3 month assessment and concerns about tamoxifen.   HPI: The patient was last seen in the medical oncology clinic on 05/06/2019. At that time, she was doing well.  She would get extremely tired in the evenings.  Exam is unremarkable.  She continued tamoxifen.  Thyroid function studies revealed 2.688 on 05/06/2019.  During the interim, she has had neuropathy in her hands and feet. She notices increasing issues with concentrating, some days she can't put thoughts and words together. It has gotten noticeably worse and it is affecting her work. At times she has difficulty understanding what work is in front of her.  She has more short term memory loss. She denies any numbness, weakness, balance or dizziness.  She has more frequent headaches in the afternoon.She notes that she will have dried blood in only the left nostril, or when she blows her nose.  She never has any active continuous flow of blood.   She has had chronic fatigue since her chemotherapy, but is getting much worse. Her husband says she will come home from work and won't move from the couch from 7 pm until 3 am when she goes to bed. He notes that she is still very tired in the morning no matter how much sleep she gets.  Vacation did not improve her symptoms.  She has had chronic congestion for the past 2 months.  Every morning she will take Sudafed which will help; some days she has to take another one at lunch. She has increased tear production in left eye only.  She is still having hot flashes from tamoxifen. Her present issues have been going on since before COVID-19 and isn't quite sure what to make of it all.    She notes a metal rod in her spine since middle school and has never had an MRI because of it.   Past Medical History:  Diagnosis Date  . Allergy   . Anemia    H/O  . Anxiety   . Blood transfusion without reported diagnosis   . Breast cancer ([place])    breast  . Chronic kidney disease 12-2015   PYLONEPHRITIS  . Complication of anesthesia    HARD TO WAKE UP AFTER TONSILLECTOMY AS A CHILD BUT NO PROBLEMS SINCE  . Complication of anesthesia    PT STATES THAT BACK IN OR DURING PORT PLACMENT IN 2016 THAT SHE BECAME VEERY FLUSHED, LIGHT HEADED AND CRNA SAID ANTIBIOTIC MAY BE GOING IN IN TO FAST-RATE DECREASED AND PTS SYMPTOMS RESOLVED IMMEDIATELY  . Depression   . GERD (gastroesophageal reflux disease)   . Hypertension   . Shortness of breath dyspnea     Past Surgical History:  Procedure Laterality Date  . ABDOMINAL HYSTERECTOMY  2001  . BACK SURGERY     FOR SCOLIOSIS-HERRINGTON RODS IN PLACE  . BREAST BIOPSY Bilateral    08/17/2015   . BREAST RECONSTRUCTION Bilateral   . PORTACATH PLACEMENT N/A 08/30/2015   Procedure: INSERTION PORT-A-CATH;  Surgeon: Leonie Green, MD;  Location: [facility] ORS;  Service: General;  Laterality: N/A;  . SENTINEL NODE BIOPSY Bilateral 03/08/2016   Procedure: SENTINEL NODE BIOPSY;  Surgeon: Leonie Green, MD;  Location:  [facility] ORS;  Service: General;  Laterality: Bilateral;  . SIMPLE MASTECTOMY WITH AXILLARY SENTINEL NODE BIOPSY Bilateral 03/08/2016   Procedure: SIMPLE MASTECTOMY;  Surgeon: Leonie Green, MD;  Location: [facility] ORS;  Service: General;  Laterality: Bilateral;  . TONSILLECTOMY      Family History  Problem Relation Age of Onset  . Breast cancer Maternal Aunt 64  . Cancer Father   . Cancer Sister   . Cancer Maternal Grandfather   . Colon cancer Neg Hx   . Esophageal cancer Neg Hx   . Rectal cancer Neg Hx   . Stomach cancer Neg Hx     Social History:  reports that she quit smoking about 19 years ago. Her smoking use included  cigarettes. She has a 10.00 pack-year smoking history. She has never used smokeless tobacco. She reports current alcohol use. She reports that she does not use drugs. Her husband states that she drinks 2-4 bottles of wine/week. She returned to full-time work on 10/24/2016. Her daughter was married in 01/2017. Her daughter will have her first grandchild in the next few weeks.She works out on the treadmill 3 days/week. The patient is accompanied by Barbaraann Rondo her husband via video call today.  Allergies:  Allergies  Allergen Reactions  . Ceftin [Cefuroxime Axetil] Rash    Current Medications: Current Outpatient Medications  Medication Sig Dispense Refill  . ALPRAZolam (XANAX) 0.25 MG tablet Take 0.0625 mg by mouth 3 (three) times daily as needed for anxiety.    . Calcium Carbonate-Vitamin D3 600-400 MG-UNIT TABS Take 1 tablet by mouth daily.     Marland Kitchen linaclotide (LINZESS) 290 MCG CAPS capsule Take 290 mcg by mouth as needed.    . Multiple Vitamin (MULTIVITAMIN) capsule Take 1 capsule by mouth daily.    . tamoxifen (NOLVADEX) 20 MG tablet Take 1 tablet (20 mg total) by mouth daily. 90 tablet 3  . venlafaxine XR (EFFEXOR-XR) 37.5 MG 24 hr capsule Take 1 capsule (37.5 mg total) by mouth daily with breakfast. 90 capsule 1  . Black Cohosh 20 MG TABS Take 1 tablet by mouth 2 (two) times a day.     . polyethylene glycol powder (GLYCOLAX/MIRALAX) powder Take 1 Container by mouth daily.     No current facility-administered medications for this visit.     Review of Systems  Constitutional: Positive for diaphoresis (hot flashes) and malaise/fatigue (increased fatigue). Negative for chills, fever and weight loss (up 4 lbs).       Feels "great."  HENT: Positive for congestion, nosebleeds (left side only) and sinus pain. Negative for hearing loss and sore throat.   Eyes: Negative for blurred vision, double vision, photophobia and pain.       Left eye watery.  Respiratory: Negative.  Negative for cough,  sputum production, shortness of breath and wheezing.   Cardiovascular: Negative.  Negative for chest pain, palpitations, orthopnea, leg swelling and PND.  Gastrointestinal: Negative.  Negative for abdominal pain, blood in stool, constipation, diarrhea, melena, nausea and vomiting.  Genitourinary: Negative.  Negative for dysuria, frequency, hematuria and urgency.  Musculoskeletal: Negative.  Negative for back pain, joint pain and myalgias.  Skin: Negative.  Negative for rash.  Neurological: Positive for sensory change (neuropathy fingers and toes) and headaches. Negative for dizziness, tremors, speech change, loss of consciousness and weakness.  Endo/Heme/Allergies: Positive for environmental allergies. Does not bruise/bleed easily.  Psychiatric/Behavioral: Positive for memory loss (short term; concentrating). Negative for depression and substance abuse. The patient is nervous/anxious (stress from work and  CVOVID-19). The patient does not have insomnia.   All other systems reviewed and are negative.  Performance status (ECOG):  1  Vitals Blood pressure (!) 156/79, pulse 81, temperature 98.2 F (36.8 C), temperature source Tympanic, resp. rate 18, height 5' (1.524 m), weight 112 lb 7 oz (51 kg), SpO2 100 %.   Physical Exam  Constitutional: She is oriented to person, place, and time. She appears well-developed and well-nourished. No distress.  HENT:  Head: Normocephalic and atraumatic.  Mouth/Throat: Oropharynx is clear and moist. No oropharyngeal exudate.  Long blonde hair. Mask.  Eyes: Pupils are equal, round, and reactive to light. Conjunctivae and EOM are normal. Lids are everted and swept, no foreign bodies found. No scleral icterus.  Blue eyes.   Neck: Normal range of motion. Neck supple. No JVD present.  Cardiovascular: Normal rate, regular rhythm and normal heart sounds.  No murmur heard. Pulmonary/Chest: Effort normal and breath sounds normal. No respiratory distress. She has no  wheezes. She has no rales.  Abdominal: Soft. Bowel sounds are normal. She exhibits no distension and no mass. There is no abdominal tenderness. There is no rebound and no guarding.  Musculoskeletal: Normal range of motion.        General: No edema.  Lymphadenopathy:    She has no cervical adenopathy.    She has no axillary adenopathy.       Right: No supraclavicular adenopathy present.       Left: No supraclavicular adenopathy present.  Neurological: She is alert and oriented to person, place, and time. She has normal strength. No sensory deficit. She displays a negative Romberg sign.  Skin: Skin is warm and dry. No rash noted. She is not diaphoretic. No erythema. No pallor.  Psychiatric: She has a normal mood and affect. Her behavior is normal. Judgment and thought content normal. Cognition and memory are normal.  Nursing note and vitals reviewed.   No visits with results within 3 Day(s) from this visit.  Latest known visit with results is:  Appointment on 05/06/2019  Component Date Value Ref Range Status  . CA 27.29 05/06/2019 11.4  0.0 - 38.6 U/mL Final   Comment: (NOTE) Siemens Centaur Immunochemiluminometric Methodology [facility] Values obtained with different assay methods or kits cannot be used interchangeably. Results cannot be interpreted as absolute evidence of the presence or absence of malignant disease. Performed At: [facility] [place], [place] [id number] Rush Farmer MD [id number]   . Sodium 05/06/2019 137  135 - 145 mmol/L Final  . Potassium 05/06/2019 3.9  3.5 - 5.1 mmol/L Final  . Chloride 05/06/2019 105  98 - 111 mmol/L Final  . CO2 05/06/2019 21* 22 - 32 mmol/L Final  . Glucose, Bld 05/06/2019 100* 70 - 99 mg/dL Final  . BUN 05/06/2019 16  6 - 20 mg/dL Final  . Creatinine, Ser 05/06/2019 0.71  0.44 - 1.00 mg/dL Final  . Calcium 05/06/2019 9.1  8.9 - 10.3 mg/dL Final  . Total Protein 05/06/2019 7.3  6.5 - 8.1 g/dL Final  . Albumin  05/06/2019 4.1  3.5 - 5.0 g/dL Final  . AST 05/06/2019 24  15 - 41 U/L Final  . ALT 05/06/2019 22  0 - 44 U/L Final  . Alkaline Phosphatase 05/06/2019 61  38 - 126 U/L Final  . Total Bilirubin 05/06/2019 0.6  0.3 - 1.2 mg/dL Final  . GFR calc non Af Amer 05/06/2019 >60  >60 mL/min Final  . GFR calc Af Amer 05/06/2019 >60  >  60 mL/min Final  . Anion gap 05/06/2019 11  5 - 15 Final   Performed at [facility], [address] [place], [place] [address]  . WBC 05/06/2019 7.6  4.0 - 10.5 K/uL Final  . RBC 05/06/2019 4.07  3.87 - 5.11 MIL/uL Final  . Hemoglobin 05/06/2019 12.6  12.0 - 15.0 g/dL Final  . HCT 05/06/2019 39.1  36.0 - 46.0 % Final  . MCV 05/06/2019 96.1  80.0 - 100.0 fL Final  . MCH 05/06/2019 31.0  26.0 - 34.0 pg Final  . MCHC 05/06/2019 32.2  30.0 - 36.0 g/dL Final  . RDW 05/06/2019 13.1  11.5 - 15.5 % Final  . Platelets 05/06/2019 292  150 - 400 K/uL Final  . nRBC 05/06/2019 0.0  0.0 - 0.2 % Final  . Neutrophils Relative % 05/06/2019 75  % Final  . Neutro Abs 05/06/2019 5.7  1.7 - 7.7 K/uL Final  . Lymphocytes Relative 05/06/2019 19  % Final  . Lymphs Abs 05/06/2019 1.5  0.7 - 4.0 K/uL Final  . Monocytes Relative 05/06/2019 5  % Final  . Monocytes Absolute 05/06/2019 0.4  0.1 - 1.0 K/uL Final  . Eosinophils Relative 05/06/2019 1  % Final  . Eosinophils Absolute 05/06/2019 0.1  0.0 - 0.5 K/uL Final  . Basophils Relative 05/06/2019 0  % Final  . Basophils Absolute 05/06/2019 0.0  0.0 - 0.1 K/uL Final  . Immature Granulocytes 05/06/2019 0  % Final  . Abs Immature Granulocytes 05/06/2019 0.01  0.00 - 0.07 K/uL Final   Performed at [facility], [address] [place], [place] [address]  . TSH 05/06/2019 2.688  0.350 - 4.500 uIU/mL Final   Comment: Performed by a 3rd Generation assay with a functional sensitivity of <=0.01 uIU/mL. Performed at [facility], [address], [place],  [address]   . Free T4 05/06/2019 0.80  0.61 - 1.12  ng/dL Final   Comment: (NOTE) Biotin ingestion may interfere with free T4 tests. If the results are inconsistent with the TSH level, previous test results, or the clinical presentation, then consider biotin interference. If needed, order repeat testing after stopping biotin. Performed at [facility], [address] [place],  [address]     Assessment:  Emma Atkinson is a 53 y.o. female with clinical stage T2N1 (stage IIB) Her2/neu + right breast cancer s/p neoadjuvant chemotherapy and bilateral mastectomy. She completed a year of adjuvant Herceptin.  She presented with a minimally painful right breast mass with nipple inversion. Bilateral mammogram and ultrasound on 08/12/2015 revealed a 4.1 cm right superior breast mass containing pleomorphic microcalcifications. There were several satellite lesions (1.3 cm, 0.9 cm, 0.6 cm) plus a 1.3 cm mass versus complicated cyst and a 0.8 cm cyst. There was one 5 mm right axillary node with irregular thickened cortex.   Core needle biopsy on 08/17/2015 of the right breast mass revealed a grade 3 invasive mammary carcinoma. Right axillary biopsy confirmed metastatic mammary carcinoma. Tumor was ER positive (1-10%), PR positive (11-50%) and HER-2/neu 3+  PET scanon 08/26/2015 revealed hypermetabolic right breast mass (SUV 11.3) with a hypermetabolic right axillary lymph node and a subtle focus of very faint hypermetabolic activity laterally in the right breast. There was faint hypermetabolic activity at the biopsy site of the left breast (? biopsy related).   CA27.29has been followed: 19.6 on 08/22/2015, 24.7 on 01/10/2016, 15.5 on 03/23/2016, 16.3 on 12/06/2016, 17.4 on 03/05/2017, 11.7 on 04/25/2017, 15.4  on 07/01/2017, 12.9 on 10/28/2017, 18.0 on 03/20/2018, and 15.0 on 09/23/2018.  She received 4 cycles of AC(09/13/2015 - 11/08/2015) with Neulasta support. She received 4 cycles of Herceptin and Perjeta(11/29/2015 -  01/31/2016) and 12 weeks of Taxol(11/29/2015 - 02/14/2016). She tolerated her chemotherapy well. She developed a grade I neuropathy. She completed a year of adjuvant Herceptinon 11/15/2016. She began tamoxifenon 07/05/2016.   She was on neratinib from 12/06/2016 - 01/01/2017. She had significant diarrhea affecting quality of life. She has ssues with constipation (baseline on Miralax). She was on Effexorfor vasomotor symptoms (discontinued in 01/2018).  Bilateral mastectomyon 03/08/2016 revealed no residual disease in the right breast. Five lymph nodes were negative for malignancy. Pathologic stage was ypT0 ypN0. Left breast revealed benign intraductal papillomas and cysts adjacent to the biopsy site. There was no atypia or malignancy. She underwent reconstructionin 09/2016 or 10/2016 then implant removal in 08/2017 secondary to infection.    She underwent right breast reconstructionwith latissimus flap and insertion of tissue expander and left breast reconstruction with insertion of tissue expander and removal of right posterior arm skin tag on 04/04/2018. She underwent second stage breast reconstructionwith exchange and touch up with fat grafting on 06/27/2018. She underwent a scar revision and left breast lift on 10/03/2018  She received 50.4 Gyright chest wall radiation from 05/07/2016 - 06/25/2016. She underwent reconstructive surgery on 10/10/2016. She completed reconstruction on 01/11/2017.  She began tamoxifen on 07/05/2016.  She is s/p partial hysterectomy(ovaries remain) in 2001. She was premenopausal ([place] 5 and estradiol 271.9) on 08/22/2015. She is post-menopauasal ([place] 42.4 and estradiol <5.0) on 10/28/2017.  [place] 08/26/2015 revealed an ejection fraction of 55-60%. Echo on 11/25/2015 revealed an EF of 60-65%. Echo on 02/23/2016 and 05/25/2016 revealed an EF of 55-65%. Echo on 08/31/2016 revealed an EF of 50-55%. Echo on 11/29/2016 revealed an EF of  60-65%.  She has a family history of breast and ovarian cancer. My Risk genetic testingrevealed no mutation on 08/22/2015.  Colonoscopy on 09/25/2018 revealed mild diverticulosis in the sigmoid colon, descending colon, and ascending colon. There was one 2 mm polyp in the proximal ascending colon (sessile serrated polyp: no high grade dysplasia or malignancy).   Bone density on 10/28/2018 revealed osteopenia with a T-score of -1.1 at the femur left neck.   Symptomatically, she is having memory issues.  She has hot flashes associated with tamoxifen.  She has headaches in the afternoon.  Exam including neurologic exam is unremarkable.  Plan: 1.  Labs today: CBC with diff CMP, CA27.29, FSH, estradiol. 2. Stage IIB Her2/neu + right breast cancer Clinically, she is doing well.  Exam reveals no evidence of recurrent disease.  She is concerned about potential cognitive effects of tamoxifen.  Hold tamoxifen x 2 weeks.             Discuss consideration of an aromatase inhibitor.   Potential side effects reviewed. 3.   Cognitive concerns  Etiology unclear.  Possibly post chemotherapy changes, stress, etc.  Discuss head imaging given Her2/neu + disease.   Patient in agreement.   She cannot have a head MRI secondary to rods in her back.   Schedule head CT with and without contrast.  Trial off tamoxifen.  RN to contact Roger Shelter, RN, clinical trials, [id number] trial. 4.   Osteopenia Patient has mild osteopenia.             Continue calcium and vitamin D.. 5.  Fatigue  Etiology is unclear.  Multiple stressors including work and COVID-19.  TSH and free T4 were normal at last check.  Check B12 (and folate). 6.   Schedule head CT on 07/24/2019. 7.   RTC in 2 weeks for MD assessment, review of work-up, and discussion regarding direction of therapy.  I discussed the assessment and treatment plan with the patient.  The patient was provided  an opportunity to ask questions and all were answered.  The patient agreed with the plan and demonstrated an understanding of the instructions.  The patient was advised to call back if the symptoms worsen or if the condition fails to improve as anticipated.   Lequita Asal, MD, PhD    07/22/2019, 11:18 AM  I, Samul Dada, am acting as a scribe for Lequita Asal, MD.  I, [place] Mike Gip, MD, have reviewed the above documentation for accuracy and completeness, and I agree with the above.

## 2019-07-21 NOTE — Progress Notes (Signed)
The patient c/o numbness and tingling to bilateral hands and feets. The patient Name and DOB has been verified by phone today.

## 2019-07-22 ENCOUNTER — Inpatient Hospital Stay: Payer: 59 | Attending: Hematology and Oncology | Admitting: Hematology and Oncology

## 2019-07-22 ENCOUNTER — Other Ambulatory Visit: Payer: Self-pay

## 2019-07-22 ENCOUNTER — Encounter: Payer: Self-pay | Admitting: Hematology and Oncology

## 2019-07-22 ENCOUNTER — Inpatient Hospital Stay: Payer: 59

## 2019-07-22 ENCOUNTER — Ambulatory Visit: Payer: 59 | Admitting: Hematology and Oncology

## 2019-07-22 VITALS — BP 156/79 | HR 81 | Temp 98.2°F | Resp 18 | Ht 60.0 in | Wt 112.4 lb

## 2019-07-22 DIAGNOSIS — R5382 Chronic fatigue, unspecified: Secondary | ICD-10-CM | POA: Insufficient documentation

## 2019-07-22 DIAGNOSIS — K5900 Constipation, unspecified: Secondary | ICD-10-CM | POA: Insufficient documentation

## 2019-07-22 DIAGNOSIS — Z170 Estrogen receptor positive status [ER+]: Secondary | ICD-10-CM | POA: Insufficient documentation

## 2019-07-22 DIAGNOSIS — M8580 Other specified disorders of bone density and structure, unspecified site: Secondary | ICD-10-CM | POA: Diagnosis not present

## 2019-07-22 DIAGNOSIS — C50111 Malignant neoplasm of central portion of right female breast: Secondary | ICD-10-CM

## 2019-07-22 DIAGNOSIS — C50911 Malignant neoplasm of unspecified site of right female breast: Secondary | ICD-10-CM | POA: Diagnosis not present

## 2019-07-22 DIAGNOSIS — R413 Other amnesia: Secondary | ICD-10-CM

## 2019-07-22 DIAGNOSIS — G629 Polyneuropathy, unspecified: Secondary | ICD-10-CM | POA: Diagnosis not present

## 2019-07-22 DIAGNOSIS — Z9013 Acquired absence of bilateral breasts and nipples: Secondary | ICD-10-CM | POA: Insufficient documentation

## 2019-07-22 DIAGNOSIS — R232 Flushing: Secondary | ICD-10-CM | POA: Diagnosis not present

## 2019-07-22 DIAGNOSIS — R55 Syncope and collapse: Secondary | ICD-10-CM | POA: Insufficient documentation

## 2019-07-22 DIAGNOSIS — Z87891 Personal history of nicotine dependence: Secondary | ICD-10-CM | POA: Diagnosis not present

## 2019-07-22 DIAGNOSIS — G8929 Other chronic pain: Secondary | ICD-10-CM | POA: Diagnosis not present

## 2019-07-22 DIAGNOSIS — Z79899 Other long term (current) drug therapy: Secondary | ICD-10-CM | POA: Diagnosis not present

## 2019-07-22 DIAGNOSIS — Z803 Family history of malignant neoplasm of breast: Secondary | ICD-10-CM | POA: Diagnosis not present

## 2019-07-22 DIAGNOSIS — M85852 Other specified disorders of bone density and structure, left thigh: Secondary | ICD-10-CM | POA: Diagnosis not present

## 2019-07-22 DIAGNOSIS — Z8041 Family history of malignant neoplasm of ovary: Secondary | ICD-10-CM | POA: Insufficient documentation

## 2019-07-22 DIAGNOSIS — R5383 Other fatigue: Secondary | ICD-10-CM | POA: Diagnosis not present

## 2019-07-22 DIAGNOSIS — Z809 Family history of malignant neoplasm, unspecified: Secondary | ICD-10-CM | POA: Diagnosis not present

## 2019-07-22 DIAGNOSIS — R519 Headache, unspecified: Secondary | ICD-10-CM | POA: Insufficient documentation

## 2019-07-22 DIAGNOSIS — Z9221 Personal history of antineoplastic chemotherapy: Secondary | ICD-10-CM | POA: Insufficient documentation

## 2019-07-22 DIAGNOSIS — Z881 Allergy status to other antibiotic agents status: Secondary | ICD-10-CM | POA: Diagnosis not present

## 2019-07-22 DIAGNOSIS — R0981 Nasal congestion: Secondary | ICD-10-CM | POA: Diagnosis not present

## 2019-07-22 LAB — CBC WITH DIFFERENTIAL/PLATELET
Abs Immature Granulocytes: 0.03 10*3/uL (ref 0.00–0.07)
Basophils Absolute: 0 10*3/uL (ref 0.0–0.1)
Basophils Relative: 0 %
Eosinophils Absolute: 0 10*3/uL (ref 0.0–0.5)
Eosinophils Relative: 1 %
HCT: 38.7 % (ref 36.0–46.0)
Hemoglobin: 12.6 g/dL (ref 12.0–15.0)
Immature Granulocytes: 0 %
Lymphocytes Relative: 19 %
Lymphs Abs: 1.5 10*3/uL (ref 0.7–4.0)
MCH: 31 pg (ref 26.0–34.0)
MCHC: 32.6 g/dL (ref 30.0–36.0)
MCV: 95.1 fL (ref 80.0–100.0)
Monocytes Absolute: 0.4 10*3/uL (ref 0.1–1.0)
Monocytes Relative: 5 %
Neutro Abs: 5.8 10*3/uL (ref 1.7–7.7)
Neutrophils Relative %: 75 %
Platelets: 289 10*3/uL (ref 150–400)
RBC: 4.07 MIL/uL (ref 3.87–5.11)
RDW: 13.4 % (ref 11.5–15.5)
WBC: 7.8 10*3/uL (ref 4.0–10.5)
nRBC: 0 % (ref 0.0–0.2)

## 2019-07-22 LAB — COMPREHENSIVE METABOLIC PANEL
ALT: 18 U/L (ref 0–44)
AST: 23 U/L (ref 15–41)
Albumin: 4.4 g/dL (ref 3.5–5.0)
Alkaline Phosphatase: 57 U/L (ref 38–126)
Anion gap: 8 (ref 5–15)
BUN: 13 mg/dL (ref 6–20)
CO2: 25 mmol/L (ref 22–32)
Calcium: 9.7 mg/dL (ref 8.9–10.3)
Chloride: 104 mmol/L (ref 98–111)
Creatinine, Ser: 0.56 mg/dL (ref 0.44–1.00)
GFR calc Af Amer: >60 mL/min (ref >60)
GFR calc non Af Amer: >60 mL/min (ref >60)
Glucose, Bld: 84 mg/dL (ref 70–99)
Potassium: 3.5 mmol/L (ref 3.5–5.1)
Sodium: 137 mmol/L (ref 135–145)
Total Bilirubin: 0.6 mg/dL (ref 0.3–1.2)
Total Protein: 7.3 g/dL (ref 6.5–8.1)

## 2019-07-22 LAB — FOLATE: Folate: 41 ng/mL (ref >5.9)

## 2019-07-23 LAB — ESTRADIOL: Estradiol: <5 pg/mL

## 2019-07-23 LAB — CANCER ANTIGEN 27.29: CA 27.29: 13.2 U/mL (ref 0.0–38.6)

## 2019-07-23 LAB — VITAMIN B12: Vitamin B-12: 417 pg/mL (ref 180–914)

## 2019-07-23 LAB — FOLLICLE STIMULATING HORMONE: FSH: 33.9 m[IU]/mL

## 2019-07-24 ENCOUNTER — Other Ambulatory Visit: Payer: Self-pay

## 2019-07-24 ENCOUNTER — Ambulatory Visit
Admission: RE | Admit: 2019-07-24 | Discharge: 2019-07-24 | Disposition: A | Discharge status: Home / Self Care | Payer: 59 | Source: Ambulatory Visit | Attending: Hematology and Oncology | Admitting: Hematology and Oncology

## 2019-07-24 DIAGNOSIS — G8929 Other chronic pain: Secondary | ICD-10-CM | POA: Insufficient documentation

## 2019-07-24 DIAGNOSIS — C50919 Malignant neoplasm of unspecified site of unspecified female breast: Secondary | ICD-10-CM | POA: Diagnosis not present

## 2019-07-24 DIAGNOSIS — R519 Headache, unspecified: Secondary | ICD-10-CM | POA: Insufficient documentation

## 2019-07-24 MED ORDER — IOHEXOL 300 MG/ML  SOLN
75.0000 mL | Freq: Once | INTRAMUSCULAR | Status: AC | PRN
  Administered 2019-07-24: 11:00:00 75 mL via INTRAVENOUS

## 2019-07-28 ENCOUNTER — Encounter: Payer: Self-pay | Admitting: Hematology and Oncology

## 2019-07-29 DIAGNOSIS — G629 Polyneuropathy, unspecified: Secondary | ICD-10-CM | POA: Insufficient documentation

## 2019-07-29 DIAGNOSIS — R413 Other amnesia: Secondary | ICD-10-CM | POA: Insufficient documentation

## 2019-07-31 ENCOUNTER — Other Ambulatory Visit: Payer: Self-pay

## 2019-07-31 ENCOUNTER — Inpatient Hospital Stay (HOSPITAL_BASED_OUTPATIENT_CLINIC_OR_DEPARTMENT_OTHER): Payer: 59 | Admitting: Oncology

## 2019-07-31 ENCOUNTER — Encounter: Payer: Self-pay | Admitting: Oncology

## 2019-07-31 VITALS — BP 143/72 | HR 89 | Temp 98.1°F | Ht 60.0 in | Wt 112.2 lb

## 2019-07-31 DIAGNOSIS — J3489 Other specified disorders of nose and nasal sinuses: Secondary | ICD-10-CM | POA: Diagnosis not present

## 2019-07-31 DIAGNOSIS — Z803 Family history of malignant neoplasm of breast: Secondary | ICD-10-CM | POA: Diagnosis not present

## 2019-07-31 DIAGNOSIS — C50111 Malignant neoplasm of central portion of right female breast: Secondary | ICD-10-CM

## 2019-07-31 DIAGNOSIS — Z8041 Family history of malignant neoplasm of ovary: Secondary | ICD-10-CM | POA: Diagnosis not present

## 2019-07-31 DIAGNOSIS — Z9221 Personal history of antineoplastic chemotherapy: Secondary | ICD-10-CM | POA: Diagnosis not present

## 2019-07-31 DIAGNOSIS — C50911 Malignant neoplasm of unspecified site of right female breast: Secondary | ICD-10-CM | POA: Diagnosis not present

## 2019-07-31 DIAGNOSIS — Z170 Estrogen receptor positive status [ER+]: Secondary | ICD-10-CM | POA: Diagnosis not present

## 2019-07-31 DIAGNOSIS — Z9013 Acquired absence of bilateral breasts and nipples: Secondary | ICD-10-CM | POA: Diagnosis not present

## 2019-07-31 DIAGNOSIS — Z809 Family history of malignant neoplasm, unspecified: Secondary | ICD-10-CM | POA: Diagnosis not present

## 2019-07-31 DIAGNOSIS — Z881 Allergy status to other antibiotic agents status: Secondary | ICD-10-CM | POA: Diagnosis not present

## 2019-07-31 DIAGNOSIS — R55 Syncope and collapse: Secondary | ICD-10-CM | POA: Diagnosis not present

## 2019-07-31 NOTE — Progress Notes (Signed)
No new changes noted today 

## 2019-07-31 NOTE — Progress Notes (Signed)
[facility]  [address], [address] [place], [place] [address] Phone: [phone]  Fax: [phone]   Clinic Day:  07/31/2019  Referring physician: Idelle Crouch, MD  Chief Complaint: Emma Atkinson is a 53 y.o. female with clinical stage IIB Her2/neu+ right breast cancer who is seen today after discontinuing tamoxifen due to significant side effects including fatigue, headaches and memory issues approximately 1 week ago.  HPI: The patient was last seen in the medical oncology clinic on 07/22/19. At that time, she complained of neuropathy in her hands and feet, issues with concentrating, memory loss frequent headaches and chronic fatigue since completing chemotherapy.  She also complained of chronic congestion x2 months.  She continued to have hot flashes thought to be do to tamoxifen.    She was instructed to stop tamoxifen for 2 weeks to see if neurological symptoms improved.  Spoke about switching possibly to AI should symptoms get better.  She was unable to have an MRI of her brain due to metal rod in her spine.  A CT head was ordered which revealed normal for age disease and no evidence of metastatic disease.  During the interim, she feels her fatigue has improved.  She continues to have memory concerns, sinus congestion and headache and mild neuropathy in hands and feet.  She is scheduled to meet with a nurse in research to enroll in a clinical trial for her memory issues today.   Past Medical History:  Diagnosis Date  . Allergy   . Anemia    H/O  . Anxiety   . Blood transfusion without reported diagnosis   . Breast cancer ([place])    breast  . Chronic kidney disease 12-2015   PYLONEPHRITIS  . Complication of anesthesia    HARD TO WAKE UP AFTER TONSILLECTOMY AS A CHILD BUT NO PROBLEMS SINCE  . Complication of anesthesia    PT STATES THAT BACK IN OR DURING PORT PLACMENT IN 2016 THAT SHE BECAME VEERY FLUSHED, LIGHT HEADED AND CRNA SAID ANTIBIOTIC MAY  BE GOING IN IN TO FAST-RATE DECREASED AND PTS SYMPTOMS RESOLVED IMMEDIATELY  . Depression   . GERD (gastroesophageal reflux disease)   . Hypertension   . Shortness of breath dyspnea     Past Surgical History:  Procedure Laterality Date  . ABDOMINAL HYSTERECTOMY  2001  . BACK SURGERY     FOR SCOLIOSIS-HERRINGTON RODS IN PLACE  . BREAST BIOPSY Bilateral    08/17/2015   . BREAST RECONSTRUCTION Bilateral   . PORTACATH PLACEMENT N/A 08/30/2015   Procedure: INSERTION PORT-A-CATH;  Surgeon: Leonie Green, MD;  Location: [facility] ORS;  Service: General;  Laterality: N/A;  . SENTINEL NODE BIOPSY Bilateral 03/08/2016   Procedure: SENTINEL NODE BIOPSY;  Surgeon: Leonie Green, MD;  Location: [facility] ORS;  Service: General;  Laterality: Bilateral;  . SIMPLE MASTECTOMY WITH AXILLARY SENTINEL NODE BIOPSY Bilateral 03/08/2016   Procedure: SIMPLE MASTECTOMY;  Surgeon: Leonie Green, MD;  Location: [facility] ORS;  Service: General;  Laterality: Bilateral;  . TONSILLECTOMY      Family History  Problem Relation Age of Onset  . Breast cancer Maternal Aunt 64  . Cancer Father   . Cancer Sister   . Cancer Maternal Grandfather   . Colon cancer Neg Hx   . Esophageal cancer Neg Hx   . Rectal cancer Neg Hx   . Stomach cancer Neg Hx     Social History:  reports that she quit smoking about 19 years ago.  Her smoking use included cigarettes. She has a 10.00 pack-year smoking history. She has never used smokeless tobacco. She reports current alcohol use. She reports that she does not use drugs. Her husband states that she drinks 2-4 bottles of wine/week. She returned to full-time work on 10/24/2016. Her daughter was married in 01/2017. Her daughter will have her first grandchild in the next few weeks.She works out on the treadmill 3 days/week. The patient is accompanied by Barbaraann Rondo her husband via video call today.  Allergies:  Allergies  Allergen Reactions  . Ceftin [Cefuroxime Axetil] Rash     Current Medications: Current Outpatient Medications  Medication Sig Dispense Refill  . ALPRAZolam (XANAX) 0.25 MG tablet Take 0.0625 mg by mouth 3 (three) times daily as needed for anxiety.    . Black Cohosh 20 MG TABS Take 1 tablet by mouth 2 (two) times a day.     . Calcium Carbonate-Vitamin D3 600-400 MG-UNIT TABS Take 1 tablet by mouth daily.     Marland Kitchen linaclotide (LINZESS) 290 MCG CAPS capsule Take 290 mcg by mouth as needed.    . Multiple Vitamin (MULTIVITAMIN) capsule Take 1 capsule by mouth daily.    . polyethylene glycol powder (GLYCOLAX/MIRALAX) powder Take 1 Container by mouth daily.    . tamoxifen (NOLVADEX) 20 MG tablet Take 1 tablet (20 mg total) by mouth daily. 90 tablet 3  . venlafaxine XR (EFFEXOR-XR) 37.5 MG 24 hr capsule Take 1 capsule (37.5 mg total) by mouth daily with breakfast. 90 capsule 1   No current facility-administered medications for this visit.     Review of Systems  Constitutional: Positive for diaphoresis and malaise/fatigue.  HENT: Positive for sinus pain.   Neurological: Positive for tingling, sensory change and headaches.  Psychiatric/Behavioral: Positive for depression and memory loss. The patient is nervous/anxious.    Performance status (ECOG):  1  Vitals There were no vitals taken for this visit.   Physical Exam  Constitutional: She is oriented to person, place, and time. Vital signs are normal. She appears well-developed and well-nourished.  HENT:  Head: Normocephalic and atraumatic.  Eyes: Pupils are equal, round, and reactive to light.  Neck: Normal range of motion.  Cardiovascular: Normal rate and regular rhythm.  No murmur heard. Pulmonary/Chest: Effort normal and breath sounds normal. She has no wheezes.  Abdominal: Soft. Bowel sounds are normal. She exhibits no distension and no mass. There is no abdominal tenderness.  Musculoskeletal: Normal range of motion.        General: No edema.  Neurological: She is alert and oriented to person,  place, and time.  Skin: Skin is warm and dry.  Psychiatric: She has a normal mood and affect. Her behavior is normal.    No visits with results within 3 Day(s) from this visit.  Latest known visit with results is:  Office Visit on 07/22/2019  Component Date Value Ref Range Status  . Vitamin B-12 07/22/2019 417  180 - 914 pg/mL Final   Comment: (NOTE) This assay is not validated for testing neonatal or myeloproliferative syndrome specimens for Vitamin B12 levels. Performed at [facility], [place] [address] [place], [place] [address]   . Folate 07/22/2019 41.0  >5.9 ng/mL Final   Performed at [facility], [address] [place], [place] [address]  . Estradiol 07/22/2019 <5.0  pg/mL Final   Comment: (NOTE)                    Adult Female:  Follicular phase   86.7 -   166.0                      Ovulation phase    85.8 -   498.0                      Luteal phase       43.8 -   211.0                      Postmenopausal     <6.0 -    54.7                    Pregnancy                      1st trimester     215.0 - >4300.0 Roche ECLIA methodology Performed At: [facility] [place], [place] [id number] Rush Farmer MD [id number]   . [facility] 07/22/2019 33.9  mIU/mL Final   Comment: (NOTE)                    Adult Female:                      Follicular phase      3.5 -  12.5                      Ovulation phase       4.7 -  21.5                      Luteal phase          1.7 -   7.7                      Postmenopausal       25.8 - 134.8 Performed At: [facility] [place], [place] [id number] Rush Farmer MD [id number]   . CA 27.29 07/22/2019 13.2  0.0 - 38.6 U/mL Final   Comment: (NOTE) Siemens Centaur Immunochemiluminometric Methodology [facility]) Values obtained with different assay methods or kits cannot be used interchangeably. Results cannot be interpreted as absolute evidence of the presence  or absence of malignant disease. Performed At: [facility] [place], [place] [id number] Rush Farmer MD [id number]   . Sodium 07/22/2019 137  135 - 145 mmol/L Final  . Potassium 07/22/2019 3.5  3.5 - 5.1 mmol/L Final  . Chloride 07/22/2019 104  98 - 111 mmol/L Final  . CO2 07/22/2019 25  22 - 32 mmol/L Final  . Glucose, Bld 07/22/2019 84  70 - 99 mg/dL Final  . BUN 07/22/2019 13  6 - 20 mg/dL Final  . Creatinine, Ser 07/22/2019 0.56  0.44 - 1.00 mg/dL Final  . Calcium 07/22/2019 9.7  8.9 - 10.3 mg/dL Final  . Total Protein 07/22/2019 7.3  6.5 - 8.1 g/dL Final  . Albumin 07/22/2019 4.4  3.5 - 5.0 g/dL Final  . AST 07/22/2019 23  15 - 41 U/L Final  . ALT 07/22/2019 18  0 - 44 U/L Final  . Alkaline Phosphatase 07/22/2019 57  38 - 126 U/L Final  . Total Bilirubin 07/22/2019 0.6  0.3 - 1.2 mg/dL Final  . GFR calc non Af Amer 07/22/2019 >60  >60 mL/min Final  . GFR calc Af Wyvonnia Lora  07/22/2019 >60  >60 mL/min Final  . Anion gap 07/22/2019 8  5 - 15 Final   Performed at [facility], [address], [place], [place] [address]  . WBC 07/22/2019 7.8  4.0 - 10.5 K/uL Final  . RBC 07/22/2019 4.07  3.87 - 5.11 MIL/uL Final  . Hemoglobin 07/22/2019 12.6  12.0 - 15.0 g/dL Final  . HCT 07/22/2019 38.7  36.0 - 46.0 % Final  . MCV 07/22/2019 95.1  80.0 - 100.0 fL Final  . MCH 07/22/2019 31.0  26.0 - 34.0 pg Final  . MCHC 07/22/2019 32.6  30.0 - 36.0 g/dL Final  . RDW 07/22/2019 13.4  11.5 - 15.5 % Final  . Platelets 07/22/2019 289  150 - 400 K/uL Final  . nRBC 07/22/2019 0.0  0.0 - 0.2 % Final  . Neutrophils Relative % 07/22/2019 75  % Final  . Neutro Abs 07/22/2019 5.8  1.7 - 7.7 K/uL Final  . Lymphocytes Relative 07/22/2019 19  % Final  . Lymphs Abs 07/22/2019 1.5  0.7 - 4.0 K/uL Final  . Monocytes Relative 07/22/2019 5  % Final  . Monocytes Absolute 07/22/2019 0.4  0.1 - 1.0 K/uL Final  . Eosinophils Relative 07/22/2019 1  % Final  . Eosinophils Absolute  07/22/2019 0.0  0.0 - 0.5 K/uL Final  . Basophils Relative 07/22/2019 0  % Final  . Basophils Absolute 07/22/2019 0.0  0.0 - 0.1 K/uL Final  . Immature Granulocytes 07/22/2019 0  % Final  . Abs Immature Granulocytes 07/22/2019 0.03  0.00 - 0.07 K/uL Final   Performed at [facility], [address] [place],  [address]    Assessment:  LAMETRIA KLUNK is a 53 y.o. female with clinical stage T2N1 (stage IIB) Her2/neu + right breast cancer s/p neoadjuvant chemotherapy and bilateral mastectomy. She completed a year of adjuvant Herceptin.  She presented with a minimally painful right breast mass with nipple inversion. Bilateral mammogram and ultrasound on 08/12/2015 revealed a 4.1 cm right superior breast mass containing pleomorphic microcalcifications. There were several satellite lesions (1.3 cm, 0.9 cm, 0.6 cm) plus a 1.3 cm mass versus complicated cyst and a 0.8 cm cyst. There was one 5 mm right axillary node with irregular thickened cortex.   Core needle biopsy on 08/17/2015 of the right breast mass revealed a grade 3 invasive mammary carcinoma. Right axillary biopsy confirmed metastatic mammary carcinoma. Tumor was ER positive (1-10%), PR positive (11-50%) and HER-2/neu 3+  PET scanon 08/26/2015 revealed hypermetabolic right breast mass (SUV 11.3) with a hypermetabolic right axillary lymph node and a subtle focus of very faint hypermetabolic activity laterally in the right breast. There was faint hypermetabolic activity at the biopsy site of the left breast (? biopsy related).   CA27.29has been followed: 19.6 on 08/22/2015, 24.7 on 01/10/2016, 15.5 on 03/23/2016, 16.3 on 12/06/2016, 17.4 on 03/05/2017, 11.7 on 04/25/2017, 15.4 on 07/01/2017, 12.9 on 10/28/2017, 18.0 on 03/20/2018, and 15.0 on 09/23/2018.  She received 4 cycles of AC(09/13/2015 - 11/08/2015) with Neulasta support. She received 4 cycles of Herceptin and Perjeta(11/29/2015 - 01/31/2016) and 12 weeks of  Taxol(11/29/2015 - 02/14/2016). She tolerated her chemotherapy well. She developed a grade I neuropathy. She completed a year of adjuvant Herceptinon 11/15/2016. She began tamoxifenon 07/05/2016.   She was on neratinib from 12/06/2016 - 01/01/2017. She had significant diarrhea affecting quality of life. She has ssues with constipation (baseline on Miralax). She was on Effexorfor vasomotor symptoms (discontinued in 01/2018).  Bilateral  mastectomyon 03/08/2016 revealed no residual disease in the right breast. Five lymph nodes were negative for malignancy. Pathologic stage was ypT0 ypN0. Left breast revealed benign intraductal papillomas and cysts adjacent to the biopsy site. There was no atypia or malignancy. She underwent reconstructionin 09/2016 or 10/2016 then implant removal in 08/2017 secondary to infection.    She underwent right breast reconstructionwith latissimus flap and insertion of tissue expander and left breast reconstruction with insertion of tissue expander and removal of right posterior arm skin tag on 04/04/2018. She underwent second stage breast reconstructionwith exchange and touch up with fat grafting on 06/27/2018. She underwent a scar revision and left breast lift on 10/03/2018  She received 50.4 Gyright chest wall radiation from 05/07/2016 - 06/25/2016. She underwent reconstructive surgery on 10/10/2016. She completed reconstruction on 01/11/2017.  She began tamoxifen on 07/05/2016.  She is s/p partial hysterectomy(ovaries remain) in 2001. She was premenopausal ([place] 5 and estradiol 271.9) on 08/22/2015. She is post-menopauasal ([place] 42.4 and estradiol <5.0) on 10/28/2017.  [place] 08/26/2015 revealed an ejection fraction of 55-60%. Echo on 11/25/2015 revealed an EF of 60-65%. Echo on 02/23/2016 and 05/25/2016 revealed an EF of 55-65%. Echo on 08/31/2016 revealed an EF of 50-55%. Echo on 11/29/2016 revealed an EF of 60-65%.  She has a family  history of breast and ovarian cancer. My Risk genetic testingrevealed no mutation on 08/22/2015.  Colonoscopy on 09/25/2018 revealed mild diverticulosis in the sigmoid colon, descending colon, and ascending colon. There was one 2 mm polyp in the proximal ascending colon (sessile serrated polyp: no high grade dysplasia or malignancy).   Bone density on 10/28/2018 revealed osteopenia with a T-score of -1.1 at the femur left neck.   Symptomatically, she continues to have memory concerns, sinus tenderness and headaches and hot flashes.  Her fatigue has improved since discontinuing tamoxifen.  Plan: 1. Stage IIB Her2/neu + right breast cancer Clinically, she is doing well.  Exam reveals no evidence of recurrent disease.  She is concerned about potential cognitive effects of tamoxifen.  Continue to hold tamoxifen for complete 2 weeks.   Can trial AI if symptoms continue improve. 2.   Cognitive concerns  Etiology unclear.  Possibly post chemotherapy changes, stress, etc.  CT head was negative for malignancy.   Scheduled to meet with K. Shoffner for clinical trial; memory concerns.  4.   Osteopenia Patient has mild osteopenia.             Continue calcium and vitamin D.. 5.  Fatigue   Likely due to tamoxifen.  Improved since discontinuing.  Lab work from last week was normal including a normal folate and B12 level.  TSH and free T4 were normal. 6.  Neuropathy  New.  Mild.  If symptoms persist or worsen, could try acupuncture.  Patient will continue to monitor. 7.  Sinus headaches:  CT head was normal.  Will refer to ENT.  Patient prefers Dr. Ladene Artist. 8.  Disposition: Return to clinic in 1 week for reevaluation.   Greater than 50% was spent in counseling and coordination of care with this patient including but not limited to discussion of the relevant topics above (See A&P) including, but not limited to diagnosis and management of acute and chronic  medical conditions.   Faythe Casa, NP 07/31/2019 12:05 PM   07/31/2019, 9:12 AM

## 2019-08-04 NOTE — Progress Notes (Signed)
[facility]  [address] [place], [place] [address] Phone: [phone]  Fax: [phone]   Clinic Day:  08/06/2019  Referring physician: Idelle Crouch, MD  Chief Complaint: Emma Atkinson is a 53 y.o. female with clinical stage IIB Her2/neu+ right breast cancer who is seen for 2 week assessment, review of work up, and direction of therapy.    HPI: The patient was last seen in the medical oncology clinic on 07/22/2019. At that time, she was having memory issues.  She had hot flashes associated with tamoxifen.  She had headaches in the afternoon.  Exam including neurologic exam was unremarkable. She was concerned about potential cognitive effects of tamoxifen. Decision was made to hold patients tamoxifen x 2 weeks.   Labs revealed a hematocrit 38.7, hemoglobin 12.6, platelets 289,000, and WBC 7,800. CA 27.29 was 13.2.  FSH was 33.9 and estradiol <5.0 c/w a post-menopausal state. Folate was 41.0 and vitamin B-12 was 417.  Head CT on 07/24/2019 was normal. There was no metastatic disease.   She was seen by Faythe Casa, NP on 07/31/2019. She continued to have memory loss concerns. Her fatigue had improved. She had sinus congestion, headaches, and mild neuropathy in hands and feet. She was scheduled to meet with Roger Shelter, RN, in clinical trials for possible enrollment in the Remember clinical trial.   During the interim, she has felt good. She is mildly fatigued but her energy has improved. Her is struggling with short term memory loss. She is having less frequent headaches. She denies having a headache in the last 3 days. She has some congestion related to sinuses. She has a left watery eye which she attributes to allergies. She has hot flashes. She notes having some depression related to what is going on in her life and the current pandemic.    Past Medical History:  Diagnosis Date  . Allergy   . Anemia    H/O  . Anxiety   . Blood transfusion  without reported diagnosis   . Breast cancer ([place])    breast  . Chronic kidney disease 12-2015   PYLONEPHRITIS  . Complication of anesthesia    HARD TO WAKE UP AFTER TONSILLECTOMY AS A CHILD BUT NO PROBLEMS SINCE  . Complication of anesthesia    PT STATES THAT BACK IN OR DURING PORT PLACMENT IN 2016 THAT SHE BECAME VEERY FLUSHED, LIGHT HEADED AND CRNA SAID ANTIBIOTIC MAY BE GOING IN IN TO FAST-RATE DECREASED AND PTS SYMPTOMS RESOLVED IMMEDIATELY  . Depression   . GERD (gastroesophageal reflux disease)   . Hypertension   . Shortness of breath dyspnea     Past Surgical History:  Procedure Laterality Date  . ABDOMINAL HYSTERECTOMY  2001  . BACK SURGERY     FOR SCOLIOSIS-HERRINGTON RODS IN PLACE  . BREAST BIOPSY Bilateral    08/17/2015   . BREAST RECONSTRUCTION Bilateral   . PORTACATH PLACEMENT N/A 08/30/2015   Procedure: INSERTION PORT-A-CATH;  Surgeon: Leonie Green, MD;  Location: [facility] ORS;  Service: General;  Laterality: N/A;  . SENTINEL NODE BIOPSY Bilateral 03/08/2016   Procedure: SENTINEL NODE BIOPSY;  Surgeon: Leonie Green, MD;  Location: [facility] ORS;  Service: General;  Laterality: Bilateral;  . SIMPLE MASTECTOMY WITH AXILLARY SENTINEL NODE BIOPSY Bilateral 03/08/2016   Procedure: SIMPLE MASTECTOMY;  Surgeon: Leonie Green, MD;  Location: [facility] ORS;  Service: General;  Laterality: Bilateral;  . TONSILLECTOMY      Family History  Problem Relation Age  of Onset  . Breast cancer Maternal Aunt 64  . Cancer Father   . Cancer Sister   . Cancer Maternal Grandfather   . Colon cancer Neg Hx   . Esophageal cancer Neg Hx   . Rectal cancer Neg Hx   . Stomach cancer Neg Hx     Social History:  reports that she quit smoking about 19 years ago. Her smoking use included cigarettes. She has a 10.00 pack-year smoking history. She has never used smokeless tobacco. She reports current alcohol use. She reports that she does not use drugs. Her husband states that she drinks 2-4  bottles of wine/week. She returned to full-time work on 10/24/2016. Her daughter was married in 01/2017.Her daughter will have her first grandchild in the next few weeks.She works out on the treadmill 3 days/week. She is married to her husband, Emma Atkinson. The patient is alone today.  Allergies:  Allergies  Allergen Reactions  . Ceftin [Cefuroxime Axetil] Rash    Current Medications: Current Outpatient Medications  Medication Sig Dispense Refill  . ALPRAZolam (XANAX) 0.25 MG tablet Take 0.0625 mg by mouth 3 (three) times daily as needed for anxiety.    . Black Cohosh 20 MG TABS Take 1 tablet by mouth 2 (two) times a day.     . Calcium Carbonate-Vitamin D3 600-400 MG-UNIT TABS Take 1 tablet by mouth daily.     Marland Kitchen linaclotide (LINZESS) 290 MCG CAPS capsule Take 290 mcg by mouth as needed.    . Multiple Vitamin (MULTIVITAMIN) capsule Take 1 capsule by mouth daily.    . tamoxifen (NOLVADEX) 20 MG tablet Take 1 tablet (20 mg total) by mouth daily. 90 tablet 3  . venlafaxine XR (EFFEXOR-XR) 37.5 MG 24 hr capsule Take 1 capsule (37.5 mg total) by mouth daily with breakfast. 90 capsule 1  . polyethylene glycol powder (GLYCOLAX/MIRALAX) powder Take 1 Container by mouth daily.     No current facility-administered medications for this visit.     Review of Systems  Constitutional: Positive for diaphoresis (hot flashes) and malaise/fatigue (mild). Negative for chills, fever and weight loss (up 2 pounds).       Feels "good". Energy improving.  HENT: Positive for congestion (related to sinuses). Negative for nosebleeds, sinus pain and sore throat.   Eyes: Negative for blurred vision, double vision, photophobia and pain.       Left eye watery.  Respiratory: Negative.  Negative for cough, sputum production, shortness of breath and wheezing.   Cardiovascular: Negative.  Negative for chest pain, palpitations, orthopnea, leg swelling and PND.  Gastrointestinal: Negative.  Negative for abdominal pain, blood  in stool, constipation, diarrhea, melena, nausea and vomiting.  Genitourinary: Negative.  Negative for dysuria, frequency, hematuria and urgency.  Musculoskeletal: Negative.  Negative for back pain, joint pain and myalgias.  Skin: Negative.  Negative for rash.  Neurological: Positive for sensory change (neuropathy fingers and toes) and headaches (less frequent). Negative for dizziness, tremors, speech change, focal weakness, loss of consciousness and weakness.  Endo/Heme/Allergies: Positive for environmental allergies. Does not bruise/bleed easily.  Psychiatric/Behavioral: Positive for depression (personal life; COVID-19) and memory loss (short term; concentrating). Negative for substance abuse. The patient is not nervous/anxious and does not have insomnia.   All other systems reviewed and are negative.  Performance status (ECOG): 1  Vitals Blood pressure 140/75, pulse 81, temperature 98.2 F (36.8 C), temperature source Oral, resp. rate 16, weight 114 lb 6.7 oz (51.9 kg), SpO2 99 %.  Physical Exam  Constitutional: She  is oriented to person, place, and time. She appears well-developed and well-nourished. No distress.  HENT:  Head: Normocephalic and atraumatic.  Long blonde hair. Mask.  Eyes: Conjunctivae and EOM are normal. No scleral icterus.  Blue eyes.   Neurological: She is alert and oriented to person, place, and time.  Skin: She is not diaphoretic. No pallor.  Psychiatric: She has a normal mood and affect. Her speech is normal and behavior is normal. Judgment and thought content normal.  Nursing note and vitals reviewed.   No visits with results within 3 Day(s) from this visit.  Latest known visit with results is:  Office Visit on 07/22/2019  Component Date Value Ref Range Status  . Vitamin B-12 07/22/2019 417  180 - 914 pg/mL Final   Comment: (NOTE) This assay is not validated for testing neonatal or myeloproliferative syndrome specimens for Vitamin B12 levels. Performed at  [facility], [place] [address], [place], [place] [address]   . Folate 07/22/2019 41.0  >5.9 ng/mL Final   Performed at [facility], [address] [place], [place] [address]  . Estradiol 07/22/2019 <5.0  pg/mL Final   Comment: (NOTE)                    Adult Female:                      Follicular phase   85.6 -   166.0                      Ovulation phase    85.8 -   498.0                      Luteal phase       43.8 -   211.0                      Postmenopausal     <6.0 -    54.7                    Pregnancy                      1st trimester     215.0 - >4300.0 Roche ECLIA methodology Performed At: [facility] [place], [place] [id number] Rush Farmer MD [id number]   . [facility] 07/22/2019 33.9  mIU/mL Final   Comment: (NOTE)                    Adult Female:                      Follicular phase      3.5 -  12.5                      Ovulation phase       4.7 -  21.5                      Luteal phase          1.7 -   7.7                      Postmenopausal       25.8 - 134.8 Performed At: [facility] [place], [place] [id number] Rush Farmer MD [id number]   .  CA 27.29 07/22/2019 13.2  0.0 - 38.6 U/mL Final   Comment: (NOTE) Siemens Centaur Immunochemiluminometric Methodology [facility]) Values obtained with different assay methods or kits cannot be used interchangeably. Results cannot be interpreted as absolute evidence of the presence or absence of malignant disease. Performed At: [facility] [place], [place] [id number] Rush Farmer MD [id number]   . Sodium 07/22/2019 137  135 - 145 mmol/L Final  . Potassium 07/22/2019 3.5  3.5 - 5.1 mmol/L Final  . Chloride 07/22/2019 104  98 - 111 mmol/L Final  . CO2 07/22/2019 25  22 - 32 mmol/L Final  . Glucose, Bld 07/22/2019 84  70 - 99 mg/dL Final  . BUN 07/22/2019 13  6 - 20 mg/dL Final  . Creatinine, Ser 07/22/2019 0.56  0.44 - 1.00  mg/dL Final  . Calcium 07/22/2019 9.7  8.9 - 10.3 mg/dL Final  . Total Protein 07/22/2019 7.3  6.5 - 8.1 g/dL Final  . Albumin 07/22/2019 4.4  3.5 - 5.0 g/dL Final  . AST 07/22/2019 23  15 - 41 U/L Final  . ALT 07/22/2019 18  0 - 44 U/L Final  . Alkaline Phosphatase 07/22/2019 57  38 - 126 U/L Final  . Total Bilirubin 07/22/2019 0.6  0.3 - 1.2 mg/dL Final  . GFR calc non Af Amer 07/22/2019 >60  >60 mL/min Final  . GFR calc Af Amer 07/22/2019 >60  >60 mL/min Final  . Anion gap 07/22/2019 8  5 - 15 Final   Performed at [facility], [address] [place], [place] [address]  . WBC 07/22/2019 7.8  4.0 - 10.5 K/uL Final  . RBC 07/22/2019 4.07  3.87 - 5.11 MIL/uL Final  . Hemoglobin 07/22/2019 12.6  12.0 - 15.0 g/dL Final  . HCT 07/22/2019 38.7  36.0 - 46.0 % Final  . MCV 07/22/2019 95.1  80.0 - 100.0 fL Final  . MCH 07/22/2019 31.0  26.0 - 34.0 pg Final  . MCHC 07/22/2019 32.6  30.0 - 36.0 g/dL Final  . RDW 07/22/2019 13.4  11.5 - 15.5 % Final  . Platelets 07/22/2019 289  150 - 400 K/uL Final  . nRBC 07/22/2019 0.0  0.0 - 0.2 % Final  . Neutrophils Relative % 07/22/2019 75  % Final  . Neutro Abs 07/22/2019 5.8  1.7 - 7.7 K/uL Final  . Lymphocytes Relative 07/22/2019 19  % Final  . Lymphs Abs 07/22/2019 1.5  0.7 - 4.0 K/uL Final  . Monocytes Relative 07/22/2019 5  % Final  . Monocytes Absolute 07/22/2019 0.4  0.1 - 1.0 K/uL Final  . Eosinophils Relative 07/22/2019 1  % Final  . Eosinophils Absolute 07/22/2019 0.0  0.0 - 0.5 K/uL Final  . Basophils Relative 07/22/2019 0  % Final  . Basophils Absolute 07/22/2019 0.0  0.0 - 0.1 K/uL Final  . Immature Granulocytes 07/22/2019 0  % Final  . Abs Immature Granulocytes 07/22/2019 0.03  0.00 - 0.07 K/uL Final   Performed at [facility], [address], [place], [place] [address]    Assessment:  TOREY REGAN is a 53 y.o. female with clinical stage T2N1 (stage IIB) Her2/neu + right breast cancer s/p neoadjuvant  chemotherapy and bilateral mastectomy. She completed a year of adjuvant Herceptin.  She presented with a minimally painful right breast mass with nipple inversion. Bilateral mammogram and ultrasound on 08/12/2015 revealed a 4.1 cm right superior breast mass containing pleomorphic microcalcifications. There were several satellite lesions (1.3  cm, 0.9 cm, 0.6 cm) plus a 1.3 cm mass versus complicated cyst and a 0.8 cm cyst. There was one 5 mm right axillary node with irregular thickened cortex.   Core needle biopsy on 08/17/2015 of the right breast mass revealed a grade 3 invasive mammary carcinoma. Right axillary biopsy confirmed metastatic mammary carcinoma. Tumor was ER positive (1-10%), PR positive (11-50%) and HER-2/neu 3+  PET scanon 08/26/2015 revealed hypermetabolic right breast mass (SUV 11.3) with a hypermetabolic right axillary lymph node and a subtle focus of very faint hypermetabolic activity laterally in the right breast. There was faint hypermetabolic activity at the biopsy site of the left breast (? biopsy related).   CA27.29has been followed: 19.6 on 08/22/2015, 24.7 on 01/10/2016, 15.5 on 03/23/2016, 16.3 on 12/06/2016, 17.4 on 03/05/2017, 11.7 on 04/25/2017, 15.4 on 07/01/2017, 12.9 on 10/28/2017, 18.0 on 03/20/2018, 15.0 on 09/23/2018, and 13.2 on 07/22/2019.  She received 4 cycles of AC(09/13/2015 - 11/08/2015) with Neulasta support. She received 4 cycles of Herceptin and Perjeta(11/29/2015 - 01/31/2016) and 12 weeks of Taxol(11/29/2015 - 02/14/2016). She tolerated her chemotherapy well. She developed a grade I neuropathy. She completed a year of adjuvant Herceptinon 11/15/2016. She began tamoxifenon 07/05/2016 (held 07/23/2019).   She was on neratinib from 12/06/2016 - 01/01/2017. She had significant diarrhea affecting quality of life. She has ssues with constipation (baseline on Miralax). She was on Effexorfor vasomotor symptoms (discontinued in 01/2018).   Bilateral mastectomyon 03/08/2016 revealed no residual disease in the right breast. Five lymph nodes were negative for malignancy. Pathologic stage was ypT0 ypN0. Left breast revealed benign intraductal papillomas and cysts adjacent to the biopsy site. There was no atypia or malignancy. She underwent reconstructionin 09/2016 or 10/2016 then implant removal in 08/2017 secondary to infection.   She underwent right breast reconstructionwith latissimus flap and insertion of tissue expander and left breast reconstruction with insertion of tissue expander and removal of right posterior arm skin tag on 04/04/2018. She underwent second stage breast reconstructionwith exchange and touch up with fat grafting on 06/27/2018.She underwent ascar revision and left breast lifton 10/03/2018  She received 50.4 Gyright chest wall radiation from 05/07/2016 - 06/25/2016. She underwent reconstructive surgery on 10/10/2016. She completed reconstruction on 01/11/2017.Shebegantamoxifenon 07/05/2016.  She is s/p partial hysterectomy(ovaries remain) in 2001. She was premenopausal (FSH 5 and estradiol 271.9) on 08/22/2015. She is post-menopauasal (FSH 33.9 and estradiol <5.0) on 07/22/2019.  Echoon 08/26/2015 revealed an ejection fraction of 55-60%. Echo on 11/25/2015 revealed an EF of 60-65%. Echo on 02/23/2016 and 05/25/2016 revealed an EF of 55-65%. Echo on 08/31/2016 revealed an EF of 50-55%. Echo on 11/29/2016 revealed an EF of 60-65%.  She has a family history of breast and ovarian cancer. My Risk genetic testingrevealed no mutation on 08/22/2015.  Colonoscopyon 09/25/2018 revealed mild diverticulosis in the sigmoid colon, descending colon, and ascending colon.There was one 2 mm polyp in the proximal ascending colon(sessile serrated polyp:no high grade dysplasia or malignancy).   Bone density on 10/28/2018 revealed osteopeniawith a T-score of -1.1 at the femur left neck.    She has short term memory issues.  Head CT on 07/24/2019 revealed no evidence of metastatic disease.  Symptomatically, she has improved off tamoxifen.  She continues to have difficulty with short term memory.  Plan: 1.  Review labs from 07/22/2019. 2. Stage IIB Her2/neu + right breast cancer Clinically, she is doing well.             Exam reveals no evidence of recurrent disease. She has   been off tamoxifen x 2 weeks.              She has felt much better.  Review consideration of an aromatase inhibitor.  Potential side effects re-reviewed.  Rx:  Femara 2.5 mg po q day (dis: #30; 2 refills). 3.   Cognitive concerns             She continues to have short term memory issues off tamoxifen.             Head CT was negative.             Contact Roger Shelter, RN, clinical trials, EB:[id number] trial.   Patient has an appointment with her in [place] today. 4.   Osteopenia Continue calcium  and vitamin D. 5.Fatigue Etiology likely multi-factorial.             Multiple stressors including work and COVID-19.             TSH and free T4 were normal at last check.             B12 and folate were normal. 6.   RTC in 1 month for MD assessment and labs (LFTs).  I discussed the assessment and treatment plan with the patient.  The patient was provided an opportunity to ask questions and all were answered.  The patient agreed with the plan and demonstrated an understanding of the instructions.  The patient was advised to call back if the symptoms worsen or if the condition fails to improve as anticipated.  I provided 13 minutes of face-to-face time during this this encounter and > 50% was spent counseling as documented under my assessment and plan.    Lequita Asal, MD, PhD    08/06/2019, 8:53 AM  I, Selena Batten, am acting as scribe for [employer]. Mike Gip, MD, PhD.  I, Melissa C. Mike Gip, MD, have reviewed the above  documentation for accuracy and completeness, and I agree with the above.

## 2019-08-05 ENCOUNTER — Ambulatory Visit: Payer: 59 | Admitting: Hematology and Oncology

## 2019-08-05 ENCOUNTER — Encounter: Payer: Self-pay | Admitting: Hematology and Oncology

## 2019-08-05 NOTE — Progress Notes (Signed)
No new changes noted today. The patient name and DOB has been verified by phone. 

## 2019-08-06 ENCOUNTER — Inpatient Hospital Stay (HOSPITAL_BASED_OUTPATIENT_CLINIC_OR_DEPARTMENT_OTHER): Payer: 59 | Admitting: Hematology and Oncology

## 2019-08-06 ENCOUNTER — Encounter: Payer: Self-pay | Admitting: Hematology and Oncology

## 2019-08-06 ENCOUNTER — Other Ambulatory Visit: Payer: Self-pay

## 2019-08-06 VITALS — BP 140/75 | HR 81 | Temp 98.2°F | Resp 16 | Wt 114.4 lb

## 2019-08-06 DIAGNOSIS — Z809 Family history of malignant neoplasm, unspecified: Secondary | ICD-10-CM | POA: Diagnosis not present

## 2019-08-06 DIAGNOSIS — Z8041 Family history of malignant neoplasm of ovary: Secondary | ICD-10-CM | POA: Diagnosis not present

## 2019-08-06 DIAGNOSIS — Z9013 Acquired absence of bilateral breasts and nipples: Secondary | ICD-10-CM | POA: Diagnosis not present

## 2019-08-06 DIAGNOSIS — C50111 Malignant neoplasm of central portion of right female breast: Secondary | ICD-10-CM

## 2019-08-06 DIAGNOSIS — Z881 Allergy status to other antibiotic agents status: Secondary | ICD-10-CM | POA: Diagnosis not present

## 2019-08-06 DIAGNOSIS — M85852 Other specified disorders of bone density and structure, left thigh: Secondary | ICD-10-CM

## 2019-08-06 DIAGNOSIS — Z170 Estrogen receptor positive status [ER+]: Secondary | ICD-10-CM

## 2019-08-06 DIAGNOSIS — R413 Other amnesia: Secondary | ICD-10-CM

## 2019-08-06 DIAGNOSIS — Z9221 Personal history of antineoplastic chemotherapy: Secondary | ICD-10-CM | POA: Diagnosis not present

## 2019-08-06 DIAGNOSIS — C50911 Malignant neoplasm of unspecified site of right female breast: Secondary | ICD-10-CM | POA: Diagnosis not present

## 2019-08-06 DIAGNOSIS — Z803 Family history of malignant neoplasm of breast: Secondary | ICD-10-CM | POA: Diagnosis not present

## 2019-08-06 DIAGNOSIS — R5382 Chronic fatigue, unspecified: Secondary | ICD-10-CM

## 2019-08-06 DIAGNOSIS — R55 Syncope and collapse: Secondary | ICD-10-CM | POA: Diagnosis not present

## 2019-08-06 MED ORDER — LETROZOLE 2.5 MG PO TABS: 2.5000 mg | ORAL_TABLET | Freq: Every day | ORAL | 2 refills | Status: DC

## 2019-08-06 NOTE — Research (Signed)
08/06/2019   Patient in to the cancer center today for eligibility screening and consent review for [name]" and DCP-001 protocols with myself and Yolande Jolly, RN. The protocol consent / HIPAA  for [id number] version 03/21/18 with [place] active date of 05/29/2018 and DCP-001 version 12/05/2017 / [place] active date of 03/27/2018 presented to the patient in their entirety. Eligibility screening for [facility] [id number] was administered to the patient prior to signed consent with answered question of moderately worse and Hopkins Verbal Learning Test score of 5. Patient was deemed eligible as her score was < / = to 7. Both consents were reviewed for approximately one hour and included protocol specific treatment, alternatives, potential risks, side effects, and potential benefits. The patients questions were answered to her reported satisfaction. The patient understands that her participation is completely voluntary, she will not be paid to take part in the study, the study drug will be provided by the protocol and the ECG's will be paid for by the study. The patient was informed she can withdraw from the protocol at any time without any change to her already comprehensive care at the cancer center. The patient signed the consent / hipaa for both [place] [phone] and DCP-001. The patient was given a copy of the signed consent forms and business cards for each research nurse here at [employer]. The patient is changing her Tamoxifen to Femara tomorrow 08/07/19 so we will wait to register / randomize her until she has time to adjust to the Femara. She will see Dr. Mike Gip on 09/13/19 for follow up and we will try to arrange her first study visits around that time for her convenience. The patient is aware that she is taking a moderate risk medication for QTC prolongation and will require extra ECG's at baseline and through out the protocol for her safety while taking placebo / study drug. She works here on the [employer] and is close by, so scheduling for the protocol will not be a big issue for her.   Jeral Fruit, RN, BSN, OCN 1150 am 08/06/2019

## 2019-08-08 ENCOUNTER — Other Ambulatory Visit: Payer: Self-pay | Admitting: Hematology and Oncology

## 2019-08-28 ENCOUNTER — Ambulatory Visit: Payer: 59 | Admitting: Hematology and Oncology

## 2019-09-01 NOTE — Progress Notes (Signed)
Emma Atkinson  [address] [place], [place] [address] Phone: [phone]  Fax: [phone]   Clinic Day:  09/03/2019  Referring physician: Idelle Crouch, MD  Chief Complaint: TIAUNNA Atkinson is a 53 y.o. female with clinical stage IIB Her2/neu+ right breast cancer who is seen for 1 month assessment on Femara.  HPI: The patient was last seen in the medical oncology clinic on 08/06/2019. At that time, she felt better off tamoxifen.  She continued to have difficulty with short term memory. Patient continued calcium and vitamin D.  She began Femara 2.5 mg po q day.   Patient spoke with the research encounter with Emma Atkinson on 08/06/2019. She consented to research study ([place] "Remember").   During the interim, she has felt good. She does not feel as "foggy" as she did before. She notes being fatigued. She is taking her medication in the afternoon so that she does not feel so tired. She notes some nausea with the medications. She can start the "Remember" study in 11/2019. Patient questions safety of receiving the COVID-19 vaccine when it is ready.    Past Medical History:  Diagnosis Date  . Allergy   . Anemia    H/O  . Anxiety   . Blood transfusion without reported diagnosis   . Breast cancer ([place])    breast  . Chronic kidney disease 12-2015   PYLONEPHRITIS  . Complication of anesthesia    HARD TO WAKE UP AFTER TONSILLECTOMY AS A CHILD BUT NO PROBLEMS SINCE  . Complication of anesthesia    PT STATES THAT BACK IN OR DURING PORT PLACMENT IN 2016 THAT SHE BECAME VEERY FLUSHED, LIGHT HEADED AND CRNA SAID ANTIBIOTIC MAY BE GOING IN IN TO FAST-RATE DECREASED AND PTS SYMPTOMS RESOLVED IMMEDIATELY  . Depression   . GERD (gastroesophageal reflux disease)   . Hypertension   . Shortness of breath dyspnea     Past Surgical History:  Procedure Laterality Date  . ABDOMINAL HYSTERECTOMY  2001  . BACK SURGERY     FOR SCOLIOSIS-HERRINGTON RODS IN  PLACE  . BREAST BIOPSY Bilateral    08/17/2015   . BREAST RECONSTRUCTION Bilateral   . PORTACATH PLACEMENT N/A 08/30/2015   Procedure: INSERTION PORT-A-CATH;  Surgeon: Emma Green, MD;  Location: [facility] ORS;  Service: General;  Laterality: N/A;  . SENTINEL NODE BIOPSY Bilateral 03/08/2016   Procedure: SENTINEL NODE BIOPSY;  Surgeon: Emma Green, MD;  Location: [facility] ORS;  Service: General;  Laterality: Bilateral;  . SIMPLE MASTECTOMY WITH AXILLARY SENTINEL NODE BIOPSY Bilateral 03/08/2016   Procedure: SIMPLE MASTECTOMY;  Surgeon: Emma Green, MD;  Location: [facility] ORS;  Service: General;  Laterality: Bilateral;  . TONSILLECTOMY      Family History  Problem Relation Age of Onset  . Breast cancer Maternal Aunt 64  . Cancer Father   . Cancer Sister   . Cancer Maternal Grandfather   . Colon cancer Neg Hx   . Esophageal cancer Neg Hx   . Rectal cancer Neg Hx   . Stomach cancer Neg Hx     Social History:  reports that she quit smoking about 19 years ago. Her smoking use included cigarettes. She has a 10.00 pack-year smoking history. She has never used smokeless tobacco. She reports current alcohol use. She reports that she does not use drugs. Her husband states that she drinks 2-4 bottles of wine/week. She returned to full-time work on 10/24/2016. Her daughter was married in 01/2017.Her  daughter will have her first grandchild in the next few weeks.She works out on the treadmill 3 days/week. She is married to her husband, Emma Atkinson.  She lives in [place].  The patient is alone today.  Allergies:  Allergies  Allergen Reactions  . Ceftin [Cefuroxime Axetil] Rash    Current Medications: Current Outpatient Medications  Medication Sig Dispense Refill  . ALPRAZolam (XANAX) 0.25 MG tablet Take 0.0625 mg by mouth 3 (three) times daily as needed for anxiety.    . Calcium Carbonate-Vitamin D3 600-400 MG-UNIT TABS Take 1 tablet by mouth daily.     Marland Kitchen letrozole (FEMARA) 2.5 MG  tablet Take 1 tablet (2.5 mg total) by mouth daily. 30 tablet 2  . linaclotide (LINZESS) 290 MCG CAPS capsule Take 290 mcg by mouth as needed.    . Multiple Vitamin (MULTIVITAMIN) capsule Take 1 capsule by mouth daily.    . polyethylene glycol powder (GLYCOLAX/MIRALAX) powder Take 1 Container by mouth daily.    Marland Kitchen venlafaxine XR (EFFEXOR-XR) 37.5 MG 24 hr capsule TAKE 1 CAPSULE (37.5 MG TOTAL) BY MOUTH DAILY WITH BREAKFAST. 90 capsule 0  . Black Cohosh 20 MG TABS Take 1 tablet by mouth 2 (two) times a day.      No current facility-administered medications for this visit.     Review of Systems  Constitutional: Positive for malaise/fatigue (mild). Negative for chills, diaphoresis, fever and weight loss (up 2 pounds).       Feels good.  HENT: Positive for congestion. Negative for ear pain, hearing loss, nosebleeds, sinus pain, sore throat and tinnitus.   Eyes: Negative for blurred vision, double vision, photophobia and pain.       Left eye watery.  Respiratory: Negative for cough, sputum production, shortness of breath and wheezing.   Cardiovascular: Negative.  Negative for chest pain, palpitations, orthopnea, leg swelling and PND.  Gastrointestinal: Negative for abdominal pain, blood in stool, constipation, diarrhea, melena, nausea and vomiting.  Genitourinary: Negative for dysuria, frequency, hematuria and urgency.  Musculoskeletal: Negative.  Negative for back pain, joint pain and myalgias.  Skin: Negative.  Negative for rash.  Neurological: Negative for dizziness, tremors, sensory change (neuropathy fingers and toes), speech change, focal weakness, loss of consciousness, weakness and headaches.  Endo/Heme/Allergies: Positive for environmental allergies. Does not bruise/bleed easily.  Psychiatric/Behavioral: Positive for memory loss (short term; concentrating). Negative for depression and substance abuse. The patient is not nervous/anxious and does not have insomnia.   All other systems  reviewed and are negative.  Performance status (ECOG): 1  Vitals Blood pressure (!) 148/83, pulse 86, temperature 98.5 F (36.9 C), temperature source Tympanic, resp. rate 18, height 5' (1.524 m), weight 116 lb 2.9 oz (52.7 kg), SpO2 100 %.   Physical Exam  Constitutional: She is oriented to person, place, and time. She appears well-developed and well-nourished. No distress.  HENT:  Head: Normocephalic and atraumatic.  Long blonde hair. Mask.  Eyes: Conjunctivae and EOM are normal. No scleral icterus.  Blue eyes.   Neurological: She is alert and oriented to person, place, and time.  Skin: She is not diaphoretic.  Psychiatric: She has a normal mood and affect. Her speech is normal and behavior is normal. Judgment and thought content normal.  Nursing note and vitals reviewed.   No visits with results within 3 Day(s) from this visit.  Latest known visit with results is:  Office Visit on 07/22/2019  Component Date Value Ref Range Status  . Vitamin B-12 07/22/2019 417  180 - 914  pg/mL Final   Comment: (NOTE) This assay is not validated for testing neonatal or myeloproliferative syndrome specimens for Vitamin B12 levels. Performed at [facility], [place] [address], [place], [place] [address]   . Folate 07/22/2019 41.0  >5.9 ng/mL Final   Performed at [facility], [address] [place], [place] [address]  . Estradiol 07/22/2019 <5.0  pg/mL Final   Comment: (NOTE)                    Adult Female:                      Follicular phase   43.3 -   166.0                      Ovulation phase    85.8 -   498.0                      Luteal phase       43.8 -   211.0                      Postmenopausal     <6.0 -    54.7                    Pregnancy                      1st trimester     215.0 - >4300.0 Roche ECLIA methodology Performed At: [facility] [place], [place] [id number] Rush Farmer MD SA:[id number]   . [facility] 07/22/2019 33.9  mIU/mL  Final   Comment: (NOTE)                    Adult Female:                      Follicular phase      3.5 -  12.5                      Ovulation phase       4.7 -  21.5                      Luteal phase          1.7 -   7.7                      Postmenopausal       25.8 - 134.8 Performed At: [facility] [place], [place] [id number] Rush Farmer MD UR:[id number]   . CA 27.29 07/22/2019 13.2  0.0 - 38.6 U/mL Final   Comment: (NOTE) Siemens Centaur Immunochemiluminometric Methodology [facility]) Values obtained with different assay methods or kits cannot be used interchangeably. Results cannot be interpreted as absolute evidence of the presence or absence of malignant disease. Performed At: [facility] [place], [place] [id number] Rush Farmer MD [id number]   . Sodium 07/22/2019 137  135 - 145 mmol/L Final  . Potassium 07/22/2019 3.5  3.5 - 5.1 mmol/L Final  . Chloride 07/22/2019 104  98 - 111 mmol/L Final  . CO2 07/22/2019 25  22 - 32 mmol/L Final  . Glucose, Bld 07/22/2019 84  70 - 99 mg/dL Final  . BUN 07/22/2019 13  6 - 20  mg/dL Final  . Creatinine, Ser 07/22/2019 0.56  0.44 - 1.00 mg/dL Final  . Calcium 07/22/2019 9.7  8.9 - 10.3 mg/dL Final  . Total Protein 07/22/2019 7.3  6.5 - 8.1 g/dL Final  . Albumin 07/22/2019 4.4  3.5 - 5.0 g/dL Final  . AST 07/22/2019 23  15 - 41 U/L Final  . ALT 07/22/2019 18  0 - 44 U/L Final  . Alkaline Phosphatase 07/22/2019 57  38 - 126 U/L Final  . Total Bilirubin 07/22/2019 0.6  0.3 - 1.2 mg/dL Final  . GFR calc non Af Amer 07/22/2019 >60  >60 mL/min Final  . GFR calc Af Amer 07/22/2019 >60  >60 mL/min Final  . Anion gap 07/22/2019 8  5 - 15 Final   Performed at [facility], [address], [place], [place] [address]  . WBC 07/22/2019 7.8  4.0 - 10.5 K/uL Final  . RBC 07/22/2019 4.07  3.87 - 5.11 MIL/uL Final  . Hemoglobin 07/22/2019 12.6  12.0 - 15.0 g/dL Final  . HCT 07/22/2019  38.7  36.0 - 46.0 % Final  . MCV 07/22/2019 95.1  80.0 - 100.0 fL Final  . MCH 07/22/2019 31.0  26.0 - 34.0 pg Final  . MCHC 07/22/2019 32.6  30.0 - 36.0 g/dL Final  . RDW 07/22/2019 13.4  11.5 - 15.5 % Final  . Platelets 07/22/2019 289  150 - 400 K/uL Final  . nRBC 07/22/2019 0.0  0.0 - 0.2 % Final  . Neutrophils Relative % 07/22/2019 75  % Final  . Neutro Abs 07/22/2019 5.8  1.7 - 7.7 K/uL Final  . Lymphocytes Relative 07/22/2019 19  % Final  . Lymphs Abs 07/22/2019 1.5  0.7 - 4.0 K/uL Final  . Monocytes Relative 07/22/2019 5  % Final  . Monocytes Absolute 07/22/2019 0.4  0.1 - 1.0 K/uL Final  . Eosinophils Relative 07/22/2019 1  % Final  . Eosinophils Absolute 07/22/2019 0.0  0.0 - 0.5 K/uL Final  . Basophils Relative 07/22/2019 0  % Final  . Basophils Absolute 07/22/2019 0.0  0.0 - 0.1 K/uL Final  . Immature Granulocytes 07/22/2019 0  % Final  . Abs Immature Granulocytes 07/22/2019 0.03  0.00 - 0.07 K/uL Final   Performed at [facility], [address], [place], [place] [address]    Assessment:  RUDOLPH DOBLER is a 53 y.o. female with clinical stage T2N1 (stage IIB) Her2/neu + right breast cancer s/p neoadjuvant chemotherapy and bilateral mastectomy. She completed a year of adjuvant Herceptin.  She presented with a minimally painful right breast mass with nipple inversion. Bilateral mammogram and ultrasound on 08/12/2015 revealed a 4.1 cm right superior breast mass containing pleomorphic microcalcifications. There were several satellite lesions (1.3 cm, 0.9 cm, 0.6 cm) plus a 1.3 cm mass versus complicated cyst and a 0.8 cm cyst. There was one 5 mm right axillary node with irregular thickened cortex.   Core needle biopsy on 08/17/2015 of the right breast mass revealed a grade 3 invasive mammary carcinoma. Right axillary biopsy confirmed metastatic mammary carcinoma. Tumor was ER positive (1-10%), PR positive (11-50%) and HER-2/neu 3+  PET scanon 08/26/2015 revealed  hypermetabolic right breast mass (SUV 11.3) with a hypermetabolic right axillary lymph node and a subtle focus of very faint hypermetabolic activity laterally in the right breast. There was faint hypermetabolic activity at the biopsy site of the left breast (? biopsy related).   CA27.29has been followed: 19.6 on 08/22/2015, 24.7 on 01/10/2016, 15.5  on 03/23/2016, 16.3 on 12/06/2016, 17.4 on 03/05/2017, 11.7 on 04/25/2017, 15.4 on 07/01/2017, 12.9 on 10/28/2017, 18.0 on 03/20/2018, 15.0 on 09/23/2018, and 13.2 on 07/22/2019.  She received 4 cycles of AC(09/13/2015 - 11/08/2015) with Neulasta support. She received 4 cycles of Herceptin and Perjeta(11/29/2015 - 01/31/2016) and 12 weeks of Taxol(11/29/2015 - 02/14/2016). She tolerated her chemotherapy well. She developed a grade I neuropathy. She completed a year of adjuvant Herceptinon 11/15/2016. She began tamoxifenon 07/05/2016 (held 07/23/2019).   She was on neratinib from 12/06/2016 - 01/01/2017. She had significant diarrhea affecting quality of life. She has ssues with constipation (baseline on Miralax). She was on Effexorfor vasomotor symptoms (discontinued in 01/2018).  Bilateral mastectomyon 03/08/2016 revealed no residual disease in the right breast. Five lymph nodes were negative for malignancy. Pathologic stage was ypT0 ypN0. Left breast revealed benign intraductal papillomas and cysts adjacent to the biopsy site. There was no atypia or malignancy. She underwent reconstructionin 09/2016 or 10/2016 then implant removal in 08/2017 secondary to infection.   She underwent right breast reconstructionwith latissimus flap and insertion of tissue expander and left breast reconstruction with insertion of tissue expander and removal of right posterior arm skin tag on 04/04/2018. She underwent second stage breast reconstructionwith exchange and touch up with fat grafting on 06/27/2018.She underwent ascar revision and left  breast lifton 10/03/2018  She received 50.4 Gyright chest wall radiation from 05/07/2016 - 06/25/2016. She underwent reconstructive surgery on 10/10/2016. She completed reconstruction on 01/11/2017.Shebegantamoxifenon 07/05/2016. She discontinued tamoxifen on 07/22/2019 and began Femara on 08/06/2019.  She is s/p partial hysterectomy(ovaries remain) in 2001. She was premenopausal ([place] 5 and estradiol 271.9) on 08/22/2015. She is post-menopauasal (FSH 33.9 and estradiol <5.0) on 07/22/2019.  Echoon 08/26/2015 revealed an ejection fraction of 55-60%. Echo on 11/25/2015 revealed an EF of 60-65%. Echo on 02/23/2016 and 05/25/2016 revealed an EF of 55-65%. Echo on 08/31/2016 revealed an EF of 50-55%. Echo on 11/29/2016 revealed an EF of 60-65%.  She has a family history of breast and ovarian cancer. My Risk genetic testingrevealed no mutation on 08/22/2015.  Colonoscopyon 09/25/2018 revealed mild diverticulosis in the sigmoid colon, descending colon, and ascending colon.There was one 2 mm polyp in the proximal ascending colon(sessile serrated polyp:no high grade dysplasia or malignancy).   Bone density on 10/28/2018 revealed osteopeniawith a T-score of -1.1 at the femur left neck.  She has short term memory issues.  Head CT on 07/24/2019 revealed no evidence of metastatic disease.  Symptomatically, she is doing well on Femara.  Plan: 1.   Labs today: LFTs. 2. Stage IIB Her2/neu + right breast cancer Clinically, she is doing well. Exam reveals no evidence of recurrent disease. She was on tamoxifen from  07/05/2016 - 07/22/2019. She has been on Femara since 08/06/2019.   She is tolerating it well.             Continue Femara. 3.Cognitive concerns She has short term memory issues. Head CT was negative. She plans to enroll in the memory trial in 11/2019. 4.Osteopenia  Continue calcium and vitamin D. 5.Fatigue  Energy level has improved off tamoxifen. Patient has multiple stressors including work and COVID-19 pandemic. TSH and free T4 were normal at last check. B12 and folate were normal.  Continue to monitor. 6.RTC ion 11/05/2019 for MD assessment and labs (CBC with diff, CMP, CA27.29).  I discussed the assessment and treatment plan with the patient.  The patient was provided an opportunity to ask questions and all were answered.  The patient  agreed with the plan and demonstrated an understanding of the instructions.  The patient was advised to call back if the symptoms worsen or if the condition fails to improve as anticipated.   Lequita Asal, MD, PhD    09/03/2019, 9:01 AM  I, Selena Batten, am acting as scribe for [employer]. Mike Gip, MD, PhD.  I, Melissa C. Mike Gip, MD, have reviewed the above documentation for accuracy and completeness, and I agree with the above.

## 2019-09-02 ENCOUNTER — Encounter: Payer: Self-pay | Admitting: Hematology and Oncology

## 2019-09-02 NOTE — Progress Notes (Signed)
The patient c/o Nausea at times and constipation but it is nothing new. The patient name and DOB has been verified by phone today.

## 2019-09-03 ENCOUNTER — Telehealth: Payer: Self-pay

## 2019-09-03 ENCOUNTER — Encounter: Payer: Self-pay | Admitting: Hematology and Oncology

## 2019-09-03 ENCOUNTER — Inpatient Hospital Stay: Payer: 59

## 2019-09-03 ENCOUNTER — Inpatient Hospital Stay: Payer: 59 | Attending: Hematology and Oncology | Admitting: Hematology and Oncology

## 2019-09-03 ENCOUNTER — Other Ambulatory Visit: Payer: Self-pay

## 2019-09-03 VITALS — BP 148/83 | HR 86 | Temp 98.5°F | Resp 18 | Ht 60.0 in | Wt 116.2 lb

## 2019-09-03 DIAGNOSIS — D122 Benign neoplasm of ascending colon: Secondary | ICD-10-CM | POA: Diagnosis not present

## 2019-09-03 DIAGNOSIS — Z809 Family history of malignant neoplasm, unspecified: Secondary | ICD-10-CM | POA: Diagnosis not present

## 2019-09-03 DIAGNOSIS — R413 Other amnesia: Secondary | ICD-10-CM | POA: Diagnosis not present

## 2019-09-03 DIAGNOSIS — C50111 Malignant neoplasm of central portion of right female breast: Secondary | ICD-10-CM

## 2019-09-03 DIAGNOSIS — Z79810 Long term (current) use of selective estrogen receptor modulators (SERMs): Secondary | ICD-10-CM | POA: Insufficient documentation

## 2019-09-03 DIAGNOSIS — R5382 Chronic fatigue, unspecified: Secondary | ICD-10-CM

## 2019-09-03 DIAGNOSIS — K5900 Constipation, unspecified: Secondary | ICD-10-CM | POA: Insufficient documentation

## 2019-09-03 DIAGNOSIS — T451X5A Adverse effect of antineoplastic and immunosuppressive drugs, initial encounter: Secondary | ICD-10-CM | POA: Insufficient documentation

## 2019-09-03 DIAGNOSIS — R5383 Other fatigue: Secondary | ICD-10-CM | POA: Diagnosis not present

## 2019-09-03 DIAGNOSIS — Z8041 Family history of malignant neoplasm of ovary: Secondary | ICD-10-CM | POA: Diagnosis not present

## 2019-09-03 DIAGNOSIS — Z9221 Personal history of antineoplastic chemotherapy: Secondary | ICD-10-CM | POA: Diagnosis not present

## 2019-09-03 DIAGNOSIS — R110 Nausea: Secondary | ICD-10-CM | POA: Insufficient documentation

## 2019-09-03 DIAGNOSIS — C773 Secondary and unspecified malignant neoplasm of axilla and upper limb lymph nodes: Secondary | ICD-10-CM | POA: Diagnosis not present

## 2019-09-03 DIAGNOSIS — C50011 Malignant neoplasm of nipple and areola, right female breast: Secondary | ICD-10-CM | POA: Diagnosis not present

## 2019-09-03 DIAGNOSIS — M8580 Other specified disorders of bone density and structure, unspecified site: Secondary | ICD-10-CM | POA: Diagnosis not present

## 2019-09-03 DIAGNOSIS — G620 Drug-induced polyneuropathy: Secondary | ICD-10-CM | POA: Insufficient documentation

## 2019-09-03 DIAGNOSIS — Z170 Estrogen receptor positive status [ER+]: Secondary | ICD-10-CM | POA: Diagnosis not present

## 2019-09-03 DIAGNOSIS — Z79899 Other long term (current) drug therapy: Secondary | ICD-10-CM | POA: Insufficient documentation

## 2019-09-03 DIAGNOSIS — Z9013 Acquired absence of bilateral breasts and nipples: Secondary | ICD-10-CM | POA: Diagnosis not present

## 2019-09-03 DIAGNOSIS — M85852 Other specified disorders of bone density and structure, left thigh: Secondary | ICD-10-CM

## 2019-09-03 DIAGNOSIS — R55 Syncope and collapse: Secondary | ICD-10-CM | POA: Insufficient documentation

## 2019-09-03 DIAGNOSIS — Z87891 Personal history of nicotine dependence: Secondary | ICD-10-CM | POA: Insufficient documentation

## 2019-09-03 DIAGNOSIS — Z881 Allergy status to other antibiotic agents status: Secondary | ICD-10-CM | POA: Diagnosis not present

## 2019-09-03 DIAGNOSIS — Z79811 Long term (current) use of aromatase inhibitors: Secondary | ICD-10-CM | POA: Insufficient documentation

## 2019-09-03 DIAGNOSIS — Z803 Family history of malignant neoplasm of breast: Secondary | ICD-10-CM | POA: Insufficient documentation

## 2019-09-03 LAB — HEPATIC FUNCTION PANEL
ALT: 26 U/L (ref 0–44)
AST: 26 U/L (ref 15–41)
Albumin: 4.2 g/dL (ref 3.5–5.0)
Alkaline Phosphatase: 63 U/L (ref 38–126)
Bilirubin, Direct: <0.1 mg/dL (ref 0.0–0.2)
Total Bilirubin: 0.5 mg/dL (ref 0.3–1.2)
Total Protein: 7.3 g/dL (ref 6.5–8.1)

## 2019-09-03 NOTE — Telephone Encounter (Signed)
VM left informing patient LFTs were normal. Office number provided should patient have any further questions.

## 2019-09-03 NOTE — Telephone Encounter (Signed)
-----   Message from Lequita Asal, MD sent at 09/03/2019 12:37 PM EST ----- Regarding: Please call patient.  LFTs normal.  ----- Message ----- From: Interface, Lab In [place] Sent: 09/03/2019   9:28 AM EST To: Lequita Asal, MD

## 2019-09-04 ENCOUNTER — Ambulatory Visit: Payer: 59 | Admitting: Hematology and Oncology

## 2019-10-19 ENCOUNTER — Telehealth: Payer: Self-pay

## 2019-10-19 ENCOUNTER — Encounter: Payer: Self-pay | Admitting: Hematology and Oncology

## 2019-10-19 NOTE — Telephone Encounter (Signed)
No answer / unable to leave a message. I tryed to reach out to the patient to inform her that with her being a high risk patient. If and when it is offered she try to schedule appointment to get the vaccine.

## 2019-11-03 ENCOUNTER — Other Ambulatory Visit: Payer: Self-pay

## 2019-11-03 NOTE — Progress Notes (Signed)
[facility]  [address] [place], [place] [address] Phone: [phone]  Fax: [phone]   Telemedicine Office Visit:  11/05/2019  Referring physician: Idelle Crouch, MD  I connected with Emma Atkinson on 11/05/2019 at 8:47 AM by videoconferencing and verified that I was speaking with the correct person using 2 identifiers.  The patient was at work.  I discussed the limitations, risk, security and privacy concerns of performing an evaluation and management service by videoconferencing and the availability of in person appointments.  I also discussed with the patient that there may be a patient responsible charge related to this service.  The patient expressed understanding and agreed to proceed.   Chief Complaint: Emma Atkinson is a 54 y.o. female with clinical stage IIB Her2/neu+ right breast cancer who is seen for 2 month assessment on Femara.   HPI: The patient was last seen in the medical oncology clinic on 09/03/2019. At that time, she was doing well on Femara. LFTs were normal. CA27.29 was 13.2 on 07/22/2019.  She continued calcium and vitamin D.   Labs on 11/04/2019: Hematocrit 38.8, hemoglobin 12.5, platelets 337,000, WBC 7,600. CMP was normal. CA 27.29 19.9.   During the interim, she has done well. She is tolerating Femara well. She feels tired but not as tired as she felt on tamoxifen.  She continues to have hot flashes which she describes as aggravating. She is taking calcium and vitamin D. She notes taking magnesium to help her sleep, she was taking half a Benadryl to help her sleep better at night.   She feels like she gained weight. She is becoming more active to lose weight. Her watery eyes and congestion have resolved. She has neuropathy in her finger and toes. She notes more numbness in her fingers than her toes.   Her memory is poor. She will begin the Memory study (WF 97116) next week. She does not examine her breast monthly. She  had fer first COVID-19 vaccine last week.    Past Medical History:  Diagnosis Date  . Allergy   . Anemia    H/O  . Anxiety   . Blood transfusion without reported diagnosis   . Breast cancer ([place])    breast  . Chronic kidney disease 12-2015   PYLONEPHRITIS  . Complication of anesthesia    HARD TO WAKE UP AFTER TONSILLECTOMY AS A CHILD BUT NO PROBLEMS SINCE  . Complication of anesthesia    PT STATES THAT BACK IN OR DURING PORT PLACMENT IN 2016 THAT SHE BECAME VEERY FLUSHED, LIGHT HEADED AND CRNA SAID ANTIBIOTIC MAY BE GOING IN IN TO FAST-RATE DECREASED AND PTS SYMPTOMS RESOLVED IMMEDIATELY  . Depression   . GERD (gastroesophageal reflux disease)   . Hypertension   . Shortness of breath dyspnea     Past Surgical History:  Procedure Laterality Date  . ABDOMINAL HYSTERECTOMY  2001  . BACK SURGERY     FOR SCOLIOSIS-HERRINGTON RODS IN PLACE  . BREAST BIOPSY Bilateral    08/17/2015   . BREAST RECONSTRUCTION Bilateral   . PORTACATH PLACEMENT N/A 08/30/2015   Procedure: INSERTION PORT-A-CATH;  Surgeon: Leonie Green, MD;  Location: [facility] ORS;  Service: General;  Laterality: N/A;  . SENTINEL NODE BIOPSY Bilateral 03/08/2016   Procedure: SENTINEL NODE BIOPSY;  Surgeon: Leonie Green, MD;  Location: [facility] ORS;  Service: General;  Laterality: Bilateral;  . SIMPLE MASTECTOMY WITH AXILLARY SENTINEL NODE BIOPSY Bilateral 03/08/2016   Procedure: SIMPLE MASTECTOMY;  Surgeon: Leonie Green, MD;  Location: [facility] ORS;  Service: General;  Laterality: Bilateral;  . TONSILLECTOMY      Family History  Problem Relation Age of Onset  . Breast cancer Maternal Aunt 64  . Cancer Father   . Cancer Sister   . Cancer Maternal Grandfather   . Colon cancer Neg Hx   . Esophageal cancer Neg Hx   . Rectal cancer Neg Hx   . Stomach cancer Neg Hx     Social History:  reports that she quit smoking about 20 years ago. Her smoking use included cigarettes. She has a 10.00 pack-year smoking  history. She has never used smokeless tobacco. She reports current alcohol use. She reports that she does not use drugs. Her husband states that she drinks 2-4 bottles of wine/week. She returned to full-time work on 10/24/2016. Her daughter was married in 01/2017.Her daughter will have her first grandchild in the next few weeks.She works out on the treadmill 3 days/week.She is married to her husband, Barbaraann Rondo. She lives in [place]. The patient is alone today.  Participants in the patient's visit and their role in the encounter included the patient and Orlene Och, Therapist, sports, today.  The intake visit was provided by Orlene Och, RN.   Allergies:  Allergies  Allergen Reactions  . Ceftin [Cefuroxime Axetil] Rash    Current Medications: Current Outpatient Medications  Medication Sig Dispense Refill  . ALPRAZolam (XANAX) 0.25 MG tablet Take 0.0625 mg by mouth 3 (three) times daily as needed for anxiety.    Marland Kitchen BIOTIN PO Take by mouth.    . Calcium Carbonate-Vitamin D3 600-400 MG-UNIT TABS Take 1 tablet by mouth daily.     Marland Kitchen letrozole (FEMARA) 2.5 MG tablet Take 1 tablet (2.5 mg total) by mouth daily. 30 tablet 2  . linaclotide (LINZESS) 290 MCG CAPS capsule Take 290 mcg by mouth as needed.    . magnesium citrate SOLN Take 1 Bottle by mouth once.    . Multiple Vitamin (MULTIVITAMIN) capsule Take 1 capsule by mouth daily.    . polyethylene glycol powder (GLYCOLAX/MIRALAX) powder Take 1 Container by mouth daily.    Marland Kitchen venlafaxine XR (EFFEXOR-XR) 37.5 MG 24 hr capsule TAKE 1 CAPSULE (37.5 MG TOTAL) BY MOUTH DAILY WITH BREAKFAST. 90 capsule 0   No current facility-administered medications for this visit.    Review of Systems  Constitutional: Positive for diaphoresis (hot flashes) and malaise/fatigue (mild). Negative for chills, fever and weight loss (gaining).       Doing well. Active.  HENT: Negative.  Negative for congestion, ear pain, hearing loss, nosebleeds, sinus pain, sore throat and  tinnitus.   Eyes: Negative.  Negative for blurred vision, double vision, photophobia and pain.  Respiratory: Negative.  Negative for cough, sputum production, shortness of breath and wheezing.   Cardiovascular: Negative.  Negative for chest pain, palpitations, orthopnea, leg swelling and PND.  Gastrointestinal: Negative.  Negative for abdominal pain, blood in stool, constipation, diarrhea, melena, nausea and vomiting.  Genitourinary: Negative.  Negative for dysuria, frequency, hematuria and urgency.  Musculoskeletal: Negative.  Negative for back pain, joint pain and myalgias.  Skin: Negative.  Negative for rash.  Neurological: Negative for dizziness, tremors, sensory change (neuropathy fingers > toes), speech change, focal weakness, loss of consciousness, weakness and headaches.  Endo/Heme/Allergies: Negative.  Negative for environmental allergies. Does not bruise/bleed easily.  Psychiatric/Behavioral: Positive for memory loss (short term; concentrating). Negative for depression and substance abuse. The patient has insomnia. The patient is not  nervous/anxious.   All other systems reviewed and are negative.  Performance status (ECOG): 1  Physical Exam  Constitutional: She is oriented to person, place, and time. She appears well-developed and well-nourished. No distress.  HENT:  Head: Normocephalic and atraumatic.  Long blonde hair.  Eyes: Conjunctivae and EOM are normal. No scleral icterus.  Glasses.  Blue eyes.  Neurological: She is alert and oriented to person, place, and time.  Skin: She is not diaphoretic.  Psychiatric: She has a normal mood and affect. Her speech is normal and behavior is normal. Judgment and thought content normal.  Nursing note and vitals reviewed.   Appointment on 11/04/2019  Component Date Value Ref Range Status  . CA 27.29 11/04/2019 19.9  0.0 - 38.6 U/mL Final   Comment: (NOTE) Siemens Centaur Immunochemiluminometric Methodology [facility]) Values obtained with  different assay methods or kits cannot be used interchangeably. Results cannot be interpreted as absolute evidence of the presence or absence of malignant disease. Performed At: [facility] [place], [place] [id number] Rush Farmer MD BZ:[id number]   . Sodium 11/04/2019 138  135 - 145 mmol/L Final  . Potassium 11/04/2019 4.0  3.5 - 5.1 mmol/L Final  . Chloride 11/04/2019 99  98 - 111 mmol/L Final  . CO2 11/04/2019 24  22 - 32 mmol/L Final  . Glucose, Bld 11/04/2019 101* 70 - 99 mg/dL Final  . BUN 11/04/2019 19  6 - 20 mg/dL Final  . Creatinine, Ser 11/04/2019 0.66  0.44 - 1.00 mg/dL Final  . Calcium 11/04/2019 9.6  8.9 - 10.3 mg/dL Final  . Total Protein 11/04/2019 7.5  6.5 - 8.1 g/dL Final  . Albumin 11/04/2019 4.3  3.5 - 5.0 g/dL Final  . AST 11/04/2019 21  15 - 41 U/L Final  . ALT 11/04/2019 20  0 - 44 U/L Final  . Alkaline Phosphatase 11/04/2019 75  38 - 126 U/L Final  . Total Bilirubin 11/04/2019 0.8  0.3 - 1.2 mg/dL Final  . GFR calc non Af Amer 11/04/2019 >60  >60 mL/min Final  . GFR calc Af Amer 11/04/2019 >60  >60 mL/min Final  . Anion gap 11/04/2019 15  5 - 15 Final   Performed at [facility], [address] [place], [place] [address]  . WBC 11/04/2019 7.6  4.0 - 10.5 K/uL Final  . RBC 11/04/2019 4.11  3.87 - 5.11 MIL/uL Final  . Hemoglobin 11/04/2019 12.5  12.0 - 15.0 g/dL Final  . HCT 11/04/2019 38.8  36.0 - 46.0 % Final  . MCV 11/04/2019 94.4  80.0 - 100.0 fL Final  . MCH 11/04/2019 30.4  26.0 - 34.0 pg Final  . MCHC 11/04/2019 32.2  30.0 - 36.0 g/dL Final  . RDW 11/04/2019 12.9  11.5 - 15.5 % Final  . Platelets 11/04/2019 337  150 - 400 K/uL Final  . nRBC 11/04/2019 0.0  0.0 - 0.2 % Final  . Neutrophils Relative % 11/04/2019 71  % Final  . Neutro Abs 11/04/2019 5.4  1.7 - 7.7 K/uL Final  . Lymphocytes Relative 11/04/2019 21  % Final  . Lymphs Abs 11/04/2019 1.6  0.7 - 4.0 K/uL Final  . Monocytes Relative 11/04/2019 6  %  Final  . Monocytes Absolute 11/04/2019 0.4  0.1 - 1.0 K/uL Final  . Eosinophils Relative 11/04/2019 2  % Final  . Eosinophils Absolute 11/04/2019 0.1  0.0 - 0.5 K/uL Final  . Basophils Relative 11/04/2019 0  % Final  .  Basophils Absolute 11/04/2019 0.0  0.0 - 0.1 K/uL Final  . Immature Granulocytes 11/04/2019 0  % Final  . Abs Immature Granulocytes 11/04/2019 0.01  0.00 - 0.07 K/uL Final   Performed at [facility], [address], [place], [place] [address]    Assessment:  Emma Atkinson is a 54 y.o. female with clinical stage T2N1 (stage IIB) Her2/neu + right breast cancer s/p neoadjuvant chemotherapy and bilateral mastectomy. She completed a year of adjuvant Herceptin.  She presented with a minimally painful right breast mass with nipple inversion. Bilateral mammogram and ultrasound on 08/12/2015 revealed a 4.1 cm right superior breast mass containing pleomorphic microcalcifications. There were several satellite lesions (1.3 cm, 0.9 cm, 0.6 cm) plus a 1.3 cm mass versus complicated cyst and a 0.8 cm cyst. There was one 5 mm right axillary node with irregular thickened cortex.   Core needle biopsy on 08/17/2015 of the right breast mass revealed a grade 3 invasive mammary carcinoma. Right axillary biopsy confirmed metastatic mammary carcinoma. Tumor was ER positive (1-10%), PR positive (11-50%) and HER-2/neu 3+  PET scanon 08/26/2015 revealed hypermetabolic right breast mass (SUV 11.3) with a hypermetabolic right axillary lymph node and a subtle focus of very faint hypermetabolic activity laterally in the right breast. There was faint hypermetabolic activity at the biopsy site of the left breast (? biopsy related).   CA27.29has been followed: 19.6 on 08/22/2015, 24.7 on 01/10/2016, 15.5 on 03/23/2016, 16.3 on 12/06/2016, 17.4 on 03/05/2017, 11.7 on 04/25/2017, 15.4 on 07/01/2017, 12.9 on 10/28/2017, 18.0 on 03/20/2018, 15.0 on 09/23/2018, 13.2 on 07/22/2019, and 19.9 on  11/04/2019.  She received 4 cycles of AC(09/13/2015 - 11/08/2015) with Neulasta support. She received 4 cycles of Herceptin and Perjeta(11/29/2015 - 01/31/2016) and 12 weeks of Taxol(11/29/2015 - 02/14/2016). She tolerated her chemotherapy well. She developed a grade I neuropathy. She completed a year of adjuvant Herceptinon 11/15/2016. She began tamoxifenon 07/05/2016(held 07/23/2019).   She was on neratinib from 12/06/2016 - 01/01/2017. She had significant diarrhea affecting quality of life. She has ssues with constipation (baseline on Miralax). She was on Effexorfor vasomotor symptoms (discontinued in 01/2018).  Bilateral mastectomyon 03/08/2016 revealed no residual disease in the right breast. Five lymph nodes were negative for malignancy. Pathologic stage was ypT0 ypN0. Left breast revealed benign intraductal papillomas and cysts adjacent to the biopsy site. There was no atypia or malignancy. She underwent reconstructionin 09/2016 or 10/2016 then implant removal in 08/2017 secondary to infection.   She underwent right breast reconstructionwith latissimus flap and insertion of tissue expander and left breast reconstruction with insertion of tissue expander and removal of right posterior arm skin tag on 04/04/2018. She underwent second stage breast reconstructionwith exchange and touch up with fat grafting on 06/27/2018.She underwent ascar revision and left breast lifton 10/03/2018  She received 50.4 Gyright chest wall radiation from 05/07/2016 - 06/25/2016. She underwent reconstructive surgery on 10/10/2016. She completed reconstruction on 01/11/2017.Shebegantamoxifenon 07/05/2016. She discontinued tamoxifen on 07/22/2019 and began Femara on 08/06/2019.  She is s/p partial hysterectomy(ovaries remain) in 2001. She was premenopausal ([place] 5 and estradiol 271.9) on 08/22/2015. She is post-menopauasal (VPX10.6YIR estradiol <5.0)  on10/04/2019.  Echoon 08/26/2015 revealed an ejection fraction of 55-60%. Echo on 11/25/2015 revealed an EF of 60-65%. Echo on 02/23/2016 and 05/25/2016 revealed an EF of 55-65%. Echo on 08/31/2016 revealed an EF of 50-55%. Echo on 11/29/2016 revealed an EF of 60-65%.  She has a family history of breast and ovarian cancer. My Risk genetic testingrevealed no mutation  on 08/22/2015.  Colonoscopyon 09/25/2018 revealed mild diverticulosis in the sigmoid colon, descending colon, and ascending colon.There was one 2 mm polyp in the proximal ascending colon(sessile serrated polyp:no high grade dysplasia or malignancy).   Bone density on 10/28/2018 revealed osteopeniawith a T-score of -1.1 at the femur left neck.  She has short term memoryissues. Head CTon 07/24/2019 revealedno evidence ofmetastatic disease.  She had fer first COVID-19 vaccine last week.   Symptomatically, she feels "pretty good".  She has hot flashes and night sweats on Femara.  Plan: 1.   Review labs from 11/04/2019. 2.   Stage IIB Her2/neu + right breast cancer Clinically,she continues to do well. Exam on 05/06/2019 revealed no evidence of recurrent disease.   Encourage breast self exam. She was on tamoxifen from  07/05/2016 - 07/22/2019. She has been on Femara since 08/06/2019.                         She is tolerating it well. Continue Femara.   Rx:  Femara 2.5 mg po q day (dis: #90 with 2 refills). 3.Cognitive concerns She has short term memory issues. Head CT was negative. She is enrolling on the Memory study (WF 97116) today.  EKG today reviewed. 4.   Osteopenia Continue calcium and vitamin D. 5.Vasomotor symptoms  Patient tolerating well (aggravating).  Continue Effexor. 6.   RTC in 6 months for MD assessment and labs (CBC with diff, CMP, CA27.29).  I discussed the  assessment and treatment plan with the patient.  The patient was provided an opportunity to ask questions and all were answered.  The patient agreed with the plan and demonstrated an understanding of the instructions.  The patient was advised to call back if the symptoms worsen or if the condition fails to improve as anticipated.    Lequita Asal, MD, PhD    11/05/2019, 8:47 AM  I, Selena Batten, am acting as scribe for [employer]. Mike Gip, MD, PhD.  I,  C. Mike Gip, MD, have reviewed the above documentation for accuracy and completeness, and I agree with the above.

## 2019-11-04 ENCOUNTER — Other Ambulatory Visit: Payer: Self-pay

## 2019-11-04 ENCOUNTER — Encounter: Payer: Self-pay | Admitting: *Deleted

## 2019-11-04 ENCOUNTER — Encounter: Payer: Self-pay | Admitting: Hematology and Oncology

## 2019-11-04 ENCOUNTER — Inpatient Hospital Stay: Payer: 59 | Attending: Hematology and Oncology

## 2019-11-04 DIAGNOSIS — M85852 Other specified disorders of bone density and structure, left thigh: Secondary | ICD-10-CM | POA: Diagnosis not present

## 2019-11-04 DIAGNOSIS — R5383 Other fatigue: Secondary | ICD-10-CM | POA: Insufficient documentation

## 2019-11-04 DIAGNOSIS — Z170 Estrogen receptor positive status [ER+]: Secondary | ICD-10-CM | POA: Diagnosis not present

## 2019-11-04 DIAGNOSIS — C50911 Malignant neoplasm of unspecified site of right female breast: Secondary | ICD-10-CM | POA: Diagnosis not present

## 2019-11-04 DIAGNOSIS — C50111 Malignant neoplasm of central portion of right female breast: Secondary | ICD-10-CM

## 2019-11-04 LAB — CBC WITH DIFFERENTIAL/PLATELET
Abs Immature Granulocytes: 0.01 10*3/uL (ref 0.00–0.07)
Basophils Absolute: 0 10*3/uL (ref 0.0–0.1)
Basophils Relative: 0 %
Eosinophils Absolute: 0.1 10*3/uL (ref 0.0–0.5)
Eosinophils Relative: 2 %
HCT: 38.8 % (ref 36.0–46.0)
Hemoglobin: 12.5 g/dL (ref 12.0–15.0)
Immature Granulocytes: 0 %
Lymphocytes Relative: 21 %
Lymphs Abs: 1.6 10*3/uL (ref 0.7–4.0)
MCH: 30.4 pg (ref 26.0–34.0)
MCHC: 32.2 g/dL (ref 30.0–36.0)
MCV: 94.4 fL (ref 80.0–100.0)
Monocytes Absolute: 0.4 10*3/uL (ref 0.1–1.0)
Monocytes Relative: 6 %
Neutro Abs: 5.4 10*3/uL (ref 1.7–7.7)
Neutrophils Relative %: 71 %
Platelets: 337 10*3/uL (ref 150–400)
RBC: 4.11 MIL/uL (ref 3.87–5.11)
RDW: 12.9 % (ref 11.5–15.5)
WBC: 7.6 10*3/uL (ref 4.0–10.5)
nRBC: 0 % (ref 0.0–0.2)

## 2019-11-04 LAB — COMPREHENSIVE METABOLIC PANEL
ALT: 20 U/L (ref 0–44)
AST: 21 U/L (ref 15–41)
Albumin: 4.3 g/dL (ref 3.5–5.0)
Alkaline Phosphatase: 75 U/L (ref 38–126)
Anion gap: 15 (ref 5–15)
BUN: 19 mg/dL (ref 6–20)
CO2: 24 mmol/L (ref 22–32)
Calcium: 9.6 mg/dL (ref 8.9–10.3)
Chloride: 99 mmol/L (ref 98–111)
Creatinine, Ser: 0.66 mg/dL (ref 0.44–1.00)
GFR calc Af Amer: >60 mL/min (ref >60)
GFR calc non Af Amer: >60 mL/min (ref >60)
Glucose, Bld: 101 mg/dL — ABNORMAL HIGH (ref 70–99)
Potassium: 4 mmol/L (ref 3.5–5.1)
Sodium: 138 mmol/L (ref 135–145)
Total Bilirubin: 0.8 mg/dL (ref 0.3–1.2)
Total Protein: 7.5 g/dL (ref 6.5–8.1)

## 2019-11-04 NOTE — Progress Notes (Signed)
Patient is having trouble sleeping. She has been taking magnesium citrate but over the last couple weeks it has not been helping anymore. Also over the last 2 weeks she states that she has been having trouble getting a deep breath which seems to be worse in the morning.   Patient is experiencing a great deal of stress and anxiety with her job.

## 2019-11-05 ENCOUNTER — Other Ambulatory Visit: Payer: 59

## 2019-11-05 ENCOUNTER — Ambulatory Visit
Admission: RE | Admit: 2019-11-05 | Discharge: 2019-11-05 | Disposition: A | Discharge status: Home / Self Care | Payer: 59 | Attending: Hematology and Oncology | Admitting: Hematology and Oncology

## 2019-11-05 ENCOUNTER — Inpatient Hospital Stay (HOSPITAL_BASED_OUTPATIENT_CLINIC_OR_DEPARTMENT_OTHER): Payer: 59 | Admitting: Hematology and Oncology

## 2019-11-05 ENCOUNTER — Encounter: Payer: Self-pay | Admitting: Hematology and Oncology

## 2019-11-05 ENCOUNTER — Other Ambulatory Visit: Payer: Self-pay | Admitting: *Deleted

## 2019-11-05 ENCOUNTER — Ambulatory Visit
Admission: RE | Admit: 2019-11-05 | Discharge: 2019-11-05 | Disposition: A | Discharge status: Home / Self Care | Payer: 59 | Source: Ambulatory Visit | Attending: Hematology and Oncology | Admitting: Hematology and Oncology

## 2019-11-05 ENCOUNTER — Other Ambulatory Visit: Payer: Self-pay | Admitting: Hematology and Oncology

## 2019-11-05 DIAGNOSIS — C50111 Malignant neoplasm of central portion of right female breast: Secondary | ICD-10-CM

## 2019-11-05 DIAGNOSIS — R413 Other amnesia: Secondary | ICD-10-CM | POA: Diagnosis not present

## 2019-11-05 DIAGNOSIS — Z170 Estrogen receptor positive status [ER+]: Secondary | ICD-10-CM | POA: Diagnosis not present

## 2019-11-05 DIAGNOSIS — Z006 Encounter for examination for normal comparison and control in clinical research program: Secondary | ICD-10-CM | POA: Diagnosis not present

## 2019-11-05 DIAGNOSIS — N951 Menopausal and female climacteric states: Secondary | ICD-10-CM

## 2019-11-05 DIAGNOSIS — M85852 Other specified disorders of bone density and structure, left thigh: Secondary | ICD-10-CM

## 2019-11-05 LAB — CANCER ANTIGEN 27.29: CA 27.29: 19.9 U/mL (ref 0.0–38.6)

## 2019-11-05 NOTE — Research (Addendum)
[id number] "Remember" Consent Visit 11/05/2019 Thursday  Patient returned to the cancer center research department today for protocol re-screen and consent review. The protocol consent / hipaa for WF [phone] version 03/21/2018, [place] active date 05/29/2018 were presented to the patient in their entirety, including protocol treatment, alternatives, potential risks, side effects and potential benefits. The patients questions were answered to her reported satisfaction.The patient understands that her participation is strictly voluntary and she may withdraw her consent for participation at any time without any change to her current care. She also understands that she will not be paid by the study for her participation, the study drug will be provided and ECG's required related to medication risks are covered by the study as well. The patient signed both the consent and hipaa at this time. Eligibility screening for WF [id number] was administered to the patient with answered questions of moderately worse in relation to her memory since completion of chemotherapy and Hopkins Verbal Learning Test score was 5. The patient is deemed eligible and will be registered to the protocol after ECG results obtained and QTC <460. Patient was provided a copy of her signed verbal consent and hipaa documents for her reference during the participation of the clinical trial. She has completed the required three month interval wait time since changing drug from Tamoxifen to Femara in October. She is eligible for registration today. The patient knows that we will call her the beginning of next week when her study drug arrives to schedule her baseline neurocognitive assessments, research labs and IP drug / placebo dispensation. She knows to call the research office if she has any further questions or needs assistance prior to that visit.  Jeral Fruit, RN, BSN, OCN

## 2019-11-06 ENCOUNTER — Other Ambulatory Visit: Payer: Self-pay | Admitting: Hematology and Oncology

## 2019-11-06 DIAGNOSIS — C50111 Malignant neoplasm of central portion of right female breast: Secondary | ICD-10-CM

## 2019-11-06 DIAGNOSIS — Z170 Estrogen receptor positive status [ER+]: Secondary | ICD-10-CM

## 2019-11-07 DIAGNOSIS — N951 Menopausal and female climacteric states: Secondary | ICD-10-CM | POA: Insufficient documentation

## 2019-11-10 ENCOUNTER — Ambulatory Visit: Payer: 59

## 2019-11-11 ENCOUNTER — Other Ambulatory Visit: Payer: Self-pay

## 2019-11-11 DIAGNOSIS — Z170 Estrogen receptor positive status [ER+]: Secondary | ICD-10-CM

## 2019-11-11 DIAGNOSIS — C50111 Malignant neoplasm of central portion of right female breast: Secondary | ICD-10-CM

## 2019-11-12 ENCOUNTER — Other Ambulatory Visit: Payer: Self-pay

## 2019-11-13 ENCOUNTER — Inpatient Hospital Stay: Payer: 59

## 2019-11-13 ENCOUNTER — Other Ambulatory Visit: Payer: Self-pay

## 2019-11-13 DIAGNOSIS — C50111 Malignant neoplasm of central portion of right female breast: Secondary | ICD-10-CM

## 2019-11-13 DIAGNOSIS — R5383 Other fatigue: Secondary | ICD-10-CM | POA: Diagnosis not present

## 2019-11-13 DIAGNOSIS — R030 Elevated blood-pressure reading, without diagnosis of hypertension: Secondary | ICD-10-CM | POA: Diagnosis not present

## 2019-11-13 DIAGNOSIS — C50911 Malignant neoplasm of unspecified site of right female breast: Secondary | ICD-10-CM | POA: Diagnosis not present

## 2019-11-13 DIAGNOSIS — Z170 Estrogen receptor positive status [ER+]: Secondary | ICD-10-CM

## 2019-11-13 DIAGNOSIS — M85852 Other specified disorders of bone density and structure, left thigh: Secondary | ICD-10-CM | POA: Diagnosis not present

## 2019-11-13 DIAGNOSIS — F411 Generalized anxiety disorder: Secondary | ICD-10-CM | POA: Diagnosis not present

## 2019-11-13 LAB — CBC WITH DIFFERENTIAL/PLATELET
Abs Immature Granulocytes: 0.02 10*3/uL (ref 0.00–0.07)
Basophils Absolute: 0 10*3/uL (ref 0.0–0.1)
Basophils Relative: 0 %
Eosinophils Absolute: 0.1 10*3/uL (ref 0.0–0.5)
Eosinophils Relative: 2 %
HCT: 38.8 % (ref 36.0–46.0)
Hemoglobin: 12.5 g/dL (ref 12.0–15.0)
Immature Granulocytes: 0 %
Lymphocytes Relative: 17 %
Lymphs Abs: 1.4 10*3/uL (ref 0.7–4.0)
MCH: 30.6 pg (ref 26.0–34.0)
MCHC: 32.2 g/dL (ref 30.0–36.0)
MCV: 95.1 fL (ref 80.0–100.0)
Monocytes Absolute: 0.5 10*3/uL (ref 0.1–1.0)
Monocytes Relative: 6 %
Neutro Abs: 6.1 10*3/uL (ref 1.7–7.7)
Neutrophils Relative %: 75 %
Platelets: 370 10*3/uL (ref 150–400)
RBC: 4.08 MIL/uL (ref 3.87–5.11)
RDW: 13.1 % (ref 11.5–15.5)
WBC: 8.1 10*3/uL (ref 4.0–10.5)
nRBC: 0 % (ref 0.0–0.2)

## 2019-11-13 LAB — COMPREHENSIVE METABOLIC PANEL
ALT: 23 U/L (ref 0–44)
AST: 24 U/L (ref 15–41)
Albumin: 4.4 g/dL (ref 3.5–5.0)
Alkaline Phosphatase: 79 U/L (ref 38–126)
Anion gap: 11 (ref 5–15)
BUN: 22 mg/dL — ABNORMAL HIGH (ref 6–20)
CO2: 25 mmol/L (ref 22–32)
Calcium: 9.5 mg/dL (ref 8.9–10.3)
Chloride: 103 mmol/L (ref 98–111)
Creatinine, Ser: 0.86 mg/dL (ref 0.44–1.00)
GFR calc Af Amer: >60 mL/min (ref >60)
GFR calc non Af Amer: >60 mL/min (ref >60)
Glucose, Bld: 94 mg/dL (ref 70–99)
Potassium: 3.7 mmol/L (ref 3.5–5.1)
Sodium: 139 mmol/L (ref 135–145)
Total Bilirubin: 0.7 mg/dL (ref 0.3–1.2)
Total Protein: 7.3 g/dL (ref 6.5–8.1)

## 2019-11-13 NOTE — Research (Addendum)
WF [id number] "Remember" Baseline Visit Patient in to the cancer center for her baseline assessment on protocol WF [id number]. A & O. States she is very anxious today and had to take a xanax before coming to work. Height = 60 in. Weight = 120 lbs, Oxygen sats 100%, Temp= 97.7 P-105 BP- 150/99 (second reading). The patients first blood pressure reading was 140 / 121, encouraged the patient to take deep breaths and try to relax to obtain the re-check blood pressure. Research RN instructed the patient to call her PCP and let them know what her blood pressure was today. She states she will have it re-checked in her office when she returns to work today and call her doctor then. Denies any pain or other symptoms. Central labs were drawn for APOE genotyping per protocol requirements. The patient completed her baseline assessment booklet at this time and her QOL questionnaires. She was dispensed two bottles of study drug for the next 24 week participation in the study. Bottle A Kit No. [id number], exp: 06/13/20 5 mg caps # 42 Week 1-6 and Bottle B Kit No [id number] ex: 06/13/20 5 mg tabs # 252 for week 7-24 were dispensed with medication diaries for each month. Explanation of dosing, diary return, follow up appointments and procedures in detail provided to the patient with her verbalized understanding. She was instructed to take the medication the same time each day with or without food. Week 1-6 she takes one 5 mg tablet and Week 7-24 she will take 2 tabs 10 mg total. She will return her medication diaries at 6 weeks with her empty IP bottle of study drug. She will have follow up visits at 12, 24 and 36 week visits and telephone encounter calls with the research RN in between. She is aware the she will need to have an ECG 3-7 days after the start of her study drug donepazil / placebo. She states she will start taking her medication tomorrow 11/14/19, order for ECG entered for Dr. Mike Gip to [place], schedule visit requested. Concomitant  medications were reviewed and the patient knows to contact research for any change in her medications, and also to inform her PCP of her participation in clinical trial.  Jeral Fruit, RN, BSN, OCN  11/13/2019 [phone]

## 2019-11-14 LAB — CANCER ANTIGEN 27.29: CA 27.29: 15.1 U/mL (ref 0.0–38.6)

## 2019-11-19 ENCOUNTER — Telehealth: Payer: Self-pay

## 2019-11-19 NOTE — Telephone Encounter (Signed)
Research RN called patient to check in with her since she started study drug for WF 97116 protocol with Donepezil vs placebo for memory impairment following chemotherapy in women. Patient states she didn't start taking her study drug until Sunday night 11/15/2019 at bedtime. She says it made her really groggy and she had some nausea about an hour or so after taking it. She states the side effects have decreased now, but she does sleep really well at night. She did see her PCP Friday in relation to her elevated blood pressure in the research office that day. She has started taking the blood pressure medication in the mornings and her provider is working with her to get the dosage adjusted for her correctly. She is scheduled for her study visit ECG Friday 11/20/19 per protocol requirements. Research RN encourage the patient to call with any concerns or questions she may have and I will call her in the next 2 weeks for her protocol visit.  Jeral Fruit, RN, BSN, OCN 11/19/2019  1455 pm.

## 2019-11-20 ENCOUNTER — Ambulatory Visit
Admission: RE | Admit: 2019-11-20 | Discharge: 2019-11-20 | Disposition: A | Discharge status: Home / Self Care | Payer: 59 | Source: Ambulatory Visit | Attending: Hematology and Oncology | Admitting: Hematology and Oncology

## 2019-11-20 ENCOUNTER — Other Ambulatory Visit: Payer: Self-pay

## 2019-11-20 DIAGNOSIS — C50111 Malignant neoplasm of central portion of right female breast: Secondary | ICD-10-CM | POA: Insufficient documentation

## 2019-11-20 DIAGNOSIS — Z170 Estrogen receptor positive status [ER+]: Secondary | ICD-10-CM | POA: Diagnosis not present

## 2019-11-23 ENCOUNTER — Other Ambulatory Visit: Payer: Self-pay

## 2019-11-23 DIAGNOSIS — R030 Elevated blood-pressure reading, without diagnosis of hypertension: Secondary | ICD-10-CM | POA: Diagnosis not present

## 2019-11-23 DIAGNOSIS — I10 Essential (primary) hypertension: Secondary | ICD-10-CM | POA: Diagnosis not present

## 2019-11-23 DIAGNOSIS — C50911 Malignant neoplasm of unspecified site of right female breast: Secondary | ICD-10-CM | POA: Diagnosis not present

## 2019-11-23 DIAGNOSIS — F419 Anxiety disorder, unspecified: Secondary | ICD-10-CM | POA: Diagnosis not present

## 2019-11-27 DIAGNOSIS — C50111 Malignant neoplasm of central portion of right female breast: Secondary | ICD-10-CM

## 2019-11-27 DIAGNOSIS — Z170 Estrogen receptor positive status [ER+]: Secondary | ICD-10-CM

## 2019-11-27 NOTE — Research (Signed)
This Rn called cardiopulmonary department to inquire as to the results of the 11/20/19 ECG that was completed for research study. Annia Belt returned call to research and was unable to locate or find the ECG anywhere in the system. She states that it was completed and transmitted on that day, but it isn't showing up in the results or in Epic. She states that the old ECG equipment would delete the ECG's after so many hours, and it wouldn't transmit if it was turned off too soon after the  completion. She states this could be what had happened but there is no way to know for sure. Tammy requested the patient return to cardiopulmonary today to have the ECG completed per original order for protocol participation. The patient was notified and agreeable to come in after lunch to complete again. RN spoke with  Latanya Maudlin, RN, and she  is in agreement to complete the required ECG's in the symptom management clinic here in the cancer center so patients will not have to go to the outpatient clinic in the future. Research will schedule the appointment for a 15 minute block in symptom management from now on. Doreatha Martin, RN Research System Wide Manager was notified of this incident.   Jeral Fruit, RN, BSN, OCN 11/27/2019 1350 pm

## 2019-12-07 ENCOUNTER — Other Ambulatory Visit: Payer: Self-pay

## 2019-12-07 DIAGNOSIS — R413 Other amnesia: Secondary | ICD-10-CM

## 2019-12-07 DIAGNOSIS — I10 Essential (primary) hypertension: Secondary | ICD-10-CM | POA: Diagnosis not present

## 2019-12-07 NOTE — Research (Signed)
WF [id number] " Remember " 3 Week Phone Assessment / Study Visit:  Called patient this am to review her side effects over the telephone for her study drug. She states she is having no side effects that she is aware of, she feels as if the study drug (IP) is actually helping her. She reports a decrease in her "brain fog" that she's been having, and feels more alert than before taking the IP. She has an appointment with her primary physician, Dr. Doy Hutching today for evaluation of her blood pressure and medication review. She denies having any difficulty completing her medication diaries. She has started new medications since her last study visit, Propranolol 20 mg. BID and HCTZ 12.5 mg daily for her blood pressure that was elevated at her baseline study visit. Research RN reviewed the schedule of events for the protocol with the patient, she is aware she will be scheduled another ECG per the protocol guidelines to evaluate her QTc as she is on a medication with moderate risk for elevation. The next study visit will be at 6 weeks for a dose escalation at that time and ECG's. The patient is in agreement with all events discussed and denies any questions. RN encouraged the patient to call is she develops any untoward side effects that are new for her in the interim.   Jeral Fruit, RN, BSN, OCN 12/07/2019 1130 am.

## 2019-12-08 ENCOUNTER — Other Ambulatory Visit: Payer: Self-pay

## 2019-12-08 ENCOUNTER — Ambulatory Visit
Admission: RE | Admit: 2019-12-08 | Discharge: 2019-12-08 | Disposition: A | Discharge status: Home / Self Care | Payer: 59 | Source: Ambulatory Visit | Attending: Hematology and Oncology | Admitting: Hematology and Oncology

## 2019-12-08 DIAGNOSIS — R413 Other amnesia: Secondary | ICD-10-CM | POA: Insufficient documentation

## 2019-12-17 ENCOUNTER — Other Ambulatory Visit: Payer: Self-pay | Admitting: Hematology and Oncology

## 2020-01-20 ENCOUNTER — Other Ambulatory Visit: Payer: Self-pay

## 2020-01-20 DIAGNOSIS — R413 Other amnesia: Secondary | ICD-10-CM

## 2020-01-21 ENCOUNTER — Telehealth: Payer: Self-pay | Admitting: *Deleted

## 2020-01-21 ENCOUNTER — Ambulatory Visit: Payer: 59

## 2020-01-21 NOTE — Telephone Encounter (Signed)
T/C made to patient Emma Atkinson for 6 week follow up on the WF [id number] Remember study. Patient reports that she is taking the study drug Donepezil/placebo daily as instructed and reports that she escalated her dose as previously instructed and started taking the med from "Bottle B" on 12/27/2019. States that since she began experiencing some mild nausea after the dose escalation that resolves as soon as she eats something. [name] states she notices this more at about lunchtime, but it resolves as soon as she eats something. This is definitely related to the study drug and is an expected side effect, but only a grade 1 side effect. She denies experiencing any other adverse events from the study drug. States she is not able to bring her medication diaries by this week, but will get them to [place] next week. She reports that her only new medications are those recently prescribed for her blood pressure: HCTZ 12.5 mg daily beginning 11/13/2019 and later Propranolol 20mg  twice daily beginning on 11/23/2019. Patient was scheduled for ECG today, but changed it until tomorrow morning due to not being able to keep the appointment today. Will follow for ECG results. Denies any changes in her performance status, which remains at an ECOG-1. Yolande Jolly, BSN, MHA, OCN 01/21/2020 9:09 AM

## 2020-01-22 ENCOUNTER — Ambulatory Visit
Admission: RE | Admit: 2020-01-22 | Discharge: 2020-01-22 | Disposition: A | Discharge status: Home / Self Care | Payer: 59 | Source: Ambulatory Visit | Attending: Hematology and Oncology | Admitting: Hematology and Oncology

## 2020-01-22 ENCOUNTER — Other Ambulatory Visit: Payer: Self-pay

## 2020-01-22 DIAGNOSIS — Z136 Encounter for screening for cardiovascular disorders: Secondary | ICD-10-CM | POA: Diagnosis not present

## 2020-02-02 ENCOUNTER — Other Ambulatory Visit: Payer: Self-pay | Admitting: Internal Medicine

## 2020-02-02 DIAGNOSIS — I10 Essential (primary) hypertension: Secondary | ICD-10-CM | POA: Diagnosis not present

## 2020-02-02 DIAGNOSIS — Z0000 Encounter for general adult medical examination without abnormal findings: Secondary | ICD-10-CM | POA: Diagnosis not present

## 2020-02-02 DIAGNOSIS — C50911 Malignant neoplasm of unspecified site of right female breast: Secondary | ICD-10-CM | POA: Diagnosis not present

## 2020-02-02 DIAGNOSIS — F419 Anxiety disorder, unspecified: Secondary | ICD-10-CM | POA: Diagnosis not present

## 2020-02-03 ENCOUNTER — Encounter: Payer: Self-pay | Admitting: Hematology and Oncology

## 2020-02-05 DIAGNOSIS — R413 Other amnesia: Secondary | ICD-10-CM

## 2020-02-05 NOTE — Research (Addendum)
WF [id number] Remember 12 Week Protocol Assessment:  Patient in to the cancer center this am to meet with research RN for her 12 week assessment per WF [id number] Protocol requirements. She is unaccompanied for this visit. ECOG-0. She states she is continuing to have some nausea since she started taking 2 pills daily, but this will go away when she eats something. She feels as if the study drug (IP) is actually helping her. She reports a decrease in her "brain fog" that she's been having, and feels more alert than before taking the IP. She states that she does not want to stop taking the IP after the protocol completion / wash out. She denies having any difficulty completing her medication diaries, returned the most recent completed diary for 2 tablets daily. The patient completed her QOL questionnaires without difficulty. The 12 week cognitive assessments were completed per protocol requirements, the patient feels she did much better this time than before taking the IP. Research RN reviewed the schedule of events for the protocol with the patient.  The next study visit will be at 24 weeks, scheduled for April 29, 2020 to begin the wash out of IP / Placebo at that visit. Research RN explained the protocol to the patient that the wash out is required with cognitive testing at 24 and 36 weeks after the IP / placebo has been completed to test for differences. Research RN will review the protocol to ascertain when the patient will know if she was getting the placebo or the IP and let her know. The patient is in agreement with all planned events discussed and denies any questions. RN encouraged the patient to call is she develops any untoward side effects that are new for her in the interim or has any further questions or concerns.  Jeral Fruit, RN, BSN, OCN 02/05/2020 1130 am.

## 2020-02-08 ENCOUNTER — Other Ambulatory Visit: Payer: Self-pay

## 2020-02-09 ENCOUNTER — Other Ambulatory Visit: Payer: Self-pay

## 2020-02-10 ENCOUNTER — Other Ambulatory Visit: Payer: Self-pay | Admitting: Hematology and Oncology

## 2020-02-11 ENCOUNTER — Other Ambulatory Visit: Payer: Self-pay

## 2020-02-11 DIAGNOSIS — R413 Other amnesia: Secondary | ICD-10-CM

## 2020-02-11 MED ORDER — INV-DONEPEZIL/PLACEBO 5MG TABS WF 97116: 1.0000 | ORAL_TABLET | Freq: Every day | ORAL | 0 refills | Status: DC

## 2020-02-11 MED ORDER — INV-DONEPEZIL/PLACEBO 5MG TABS WF 97116: 2.0000 | ORAL_TABLET | Freq: Every day | ORAL | 0 refills | Status: DC

## 2020-03-02 ENCOUNTER — Ambulatory Visit: Payer: 59 | Admitting: Radiation Oncology

## 2020-03-09 ENCOUNTER — Ambulatory Visit: Payer: 59 | Admitting: Radiation Oncology

## 2020-03-10 ENCOUNTER — Other Ambulatory Visit: Payer: Self-pay

## 2020-03-10 ENCOUNTER — Ambulatory Visit
Admission: RE | Admit: 2020-03-10 | Discharge: 2020-03-10 | Disposition: A | Discharge status: Home / Self Care | Payer: 59 | Source: Ambulatory Visit | Attending: Radiation Oncology | Admitting: Radiation Oncology

## 2020-03-10 ENCOUNTER — Encounter: Payer: Self-pay | Admitting: Radiation Oncology

## 2020-03-10 DIAGNOSIS — Z923 Personal history of irradiation: Secondary | ICD-10-CM | POA: Diagnosis not present

## 2020-03-10 DIAGNOSIS — Z79811 Long term (current) use of aromatase inhibitors: Secondary | ICD-10-CM | POA: Insufficient documentation

## 2020-03-10 DIAGNOSIS — C50111 Malignant neoplasm of central portion of right female breast: Secondary | ICD-10-CM | POA: Diagnosis not present

## 2020-03-10 DIAGNOSIS — Z170 Estrogen receptor positive status [ER+]: Secondary | ICD-10-CM | POA: Insufficient documentation

## 2020-03-10 NOTE — Progress Notes (Signed)
Radiation Oncology Follow up Note  Name: Emma Atkinson   Date:   03/10/2020 MRN:  [id number] DOB: 03/25/1966    This 54 y.o. female presents to the clinic today for 3-1/2-year follow-up status post radiation therapy to right chest wall and peripheral lymphatics for stage IIIb invasive mammary carcinoma.  REFERRING PROVIDER: Idelle Crouch, MD  HPI: Patient is a 54 year old female now at 3-1/2 years having completed radiation therapy to her right chest wall and peripheral lymphatics for stage IIIb invasive mammary carcinoma.  She has had bilateral breast reconstruction.  She is seen today in routine follow-up and is doing well specifically denies any chest wall pain cough or bone pain..  She is currently on Femara tolerate it well without side effect.  COMPLICATIONS OF TREATMENT: none  FOLLOW UP COMPLIANCE: keeps appointments   PHYSICAL EXAM:  BP (!) (P) 142/73 (BP Location: Left Arm, Patient Position: Sitting)   Pulse (!) (P) 59   Temp (!) (P) 97.1 F (36.2 C) (Tympanic)   Resp (P) 16   Wt (P) 118 lb 6.4 oz (53.7 kg)   BMI (P) 23.12 kg/m  Patient status post bilateral breast reconstruction.  No evidence of mass or nodularity the chest walls are noted no axillary or supraclavicular adenopathy is identified.  Well-developed well-nourished patient in NAD. HEENT reveals PERLA, EOMI, discs not visualized.  Oral cavity is clear. No oral mucosal lesions are identified. Neck is clear without evidence of cervical or supraclavicular adenopathy. Lungs are clear to A&P. Cardiac examination is essentially unremarkable with regular rate and rhythm without murmur rub or thrill. Abdomen is benign with no organomegaly or masses noted. Motor sensory and DTR levels are equal and symmetric in the upper and lower extremities. Cranial nerves II through XII are grossly intact. Proprioception is intact. No peripheral adenopathy or edema is identified. No motor or sensory levels are noted. Crude visual fields  are within normal range.  RADIOLOGY RESULTS: No current films for review  I PLAN: at this time I am going to turn follow-up care over to Dr. Mike Gip.  I would be happy to reevaluate the patient anytime should further radiation oncology consultation be indicated.  She is doing extremely well with no evidence of disease close to 4 years out.  She continues on Femara without side effect.  Patient knows to call with any concerns.  would like to take this opportunity to thank you for allowing me to participate in the care of your patient.Noreene Filbert, MD

## 2020-03-21 ENCOUNTER — Other Ambulatory Visit: Payer: Self-pay | Admitting: Hematology and Oncology

## 2020-03-22 DIAGNOSIS — R0789 Other chest pain: Secondary | ICD-10-CM | POA: Diagnosis not present

## 2020-03-22 DIAGNOSIS — F329 Major depressive disorder, single episode, unspecified: Secondary | ICD-10-CM | POA: Diagnosis not present

## 2020-03-22 DIAGNOSIS — F419 Anxiety disorder, unspecified: Secondary | ICD-10-CM | POA: Diagnosis not present

## 2020-03-22 DIAGNOSIS — I10 Essential (primary) hypertension: Secondary | ICD-10-CM | POA: Diagnosis not present

## 2020-04-29 DIAGNOSIS — Z170 Estrogen receptor positive status [ER+]: Secondary | ICD-10-CM

## 2020-04-29 NOTE — Research (Addendum)
WF [id number] "Remember" 24 Week Protocol Assessment:   Patient in to the cancer center this am to meet with research RN for her 24 week assessment per WF [id number] protocol requirements. She is unaccompanied for this visit. ECOG-0. She states she has not had any nausea. She feels as if the study drug (IP) has actually helped her. She reports a decrease in her "brain fog" that she's been having, and feels more alert than before taking the IP. She did say she had a panic attack episode this past month with chest pressure and shortness of breath. She went to see her PCP to have this episode evaluated. She states her ECG was normal , but blood pressure was elevated. The provider feels that this may be related to her letrozole or Effexor since she started having these symptoms after she began taking this. He did not want to change her medications at this time until she sees oncology. She has a follow up appointment with her oncologist, Dr. Mike Gip next week to review her medications and side effects. She states that she does not want to stop taking the IP and wants to ask her provider to prescribe donepezil for her, research RN educated patient that this is the wash out of her study drug and testing will be completed again at her 36 week assessment.   The results of her neurocognitive testing will be an integral part of the study for the data they are collecting in relation to the IP / placebo. The patient voices understanding of this.  She denies having any difficulty completing her medication diaries.She returned the most recent completed diaries for 2 tablets daily along with her empty IP bottles, bottle A for weeks 1-6 and bottle B for weeks 7-24. Bottle A had 42 pills and is returned empty, bottle B had 252 pills returned empty with no missed doses.The patient completed her QOL questionnaires without difficulty. The 24 week cognitive assessments were completed per protocol requirements, the patient feels she did much  better this time than before taking the IP. Her height is 60 in , weight is 116.1, blood pressure was 140/77, pulse was 64 today.  The next study visit will be at 36 weeks, scheduled for July 19, 2020 for assessment following the wash out period of IP. Research RN will review the protocol to ascertain when the patient will know if she was getting the placebo or the IP and let her know. The patient is in agreement with all planned events discussed and denies any questions or concerns at this time. RN encouraged the patient to call is she develops any untoward side effects that are new for her in the interim or has any further questions. A total of 1 hour was spent with the patient today to complete study assessments.   SEMIAH KONCZAL [id number]  05/02/2020  Adverse Event Log  Study/Protocol: SWOG E9528 Cycle: 24 Week Assessment  Event Grade Onset Date Resolved Date Drug Name Attribution Treatment Comments  Chest Pain  1 03/22/2020  Donepezil / Placebo IP Not related ECG, medication review MD note states r/t anxiety or Effexor XR / Letrozole usage  Anxiety 2 03/22/2020  Donepezil / Placebo IP Not related Medication Review   Hypertension  2 03/22/2020  Donepezil / Placebo IP Not related ECG, medication review    MD note states r/t anxiety or Effexor XR / Letrozole  Jeral Fruit, RN, BSN, OCN 04/29/2020 1520 pm

## 2020-05-04 ENCOUNTER — Ambulatory Visit: Payer: 59 | Admitting: Hematology and Oncology

## 2020-05-04 ENCOUNTER — Encounter: Payer: Self-pay | Admitting: Hematology and Oncology

## 2020-05-04 ENCOUNTER — Other Ambulatory Visit: Payer: 59

## 2020-05-04 NOTE — Progress Notes (Signed)
[facility]  [address] [place],  [address] Phone: [phone]  Fax: [phone]   Office Visit:  05/05/2020  Referring physician: Idelle Crouch, MD  Chief Complaint: Emma Atkinson is a 54 y.o. female with clinical stage IIB Her2/neu+ right breast cancer who is seen for 6 month assessment on Femara.   HPI: The patient was last seen in the medical oncology clinic on 11/05/2019 via telemedicine. At that time, she felt "pretty good".  She had hot flashes and night sweats on Femara.  CBC with diff, CMP, and CA27.29 was normal.  She met with Dr. Berton Mount on 03/10/2020 for routine follow up. She was doing well without evidence of recurrent disease.  He turned follow up care over to the cancer center.   She has undergone follow-up in the [facility] "Remember" protocol.  24 week protocol assessment on 04/29/2020 by Jeral Fruit, RN noted that the study drug appeared to be helping.  There was decrease in "brain fog"; she was more alert.  During the interim, she has been well. She notes that with the decrease in her brain fog, she has felt wonderful (she can do so much).  She is getting more done at work and has had an easier time remembering more.   She tried trazodone for her sleep without aid. She has been taking a Xanax and Benadryl in order to get to sleep and stay asleep. She notes that she has some episodes of palpations with mild chest pain; she thinks she had a panic attack.  Follow-up EKG was normal.    Past Medical History:  Diagnosis Date  . Allergy   . Anemia    H/O  . Anxiety   . Blood transfusion without reported diagnosis   . Breast cancer ([place])    breast  . Chronic kidney disease 12-2015   PYLONEPHRITIS  . Complication of anesthesia    HARD TO WAKE UP AFTER TONSILLECTOMY AS A CHILD BUT NO PROBLEMS SINCE  . Complication of anesthesia    PT STATES THAT BACK IN OR DURING PORT PLACMENT IN 2016 THAT SHE BECAME VEERY  FLUSHED, LIGHT HEADED AND CRNA SAID ANTIBIOTIC MAY BE GOING IN IN TO FAST-RATE DECREASED AND PTS SYMPTOMS RESOLVED IMMEDIATELY  . Depression   . GERD (gastroesophageal reflux disease)   . Hypertension   . Shortness of breath dyspnea     Past Surgical History:  Procedure Laterality Date  . ABDOMINAL HYSTERECTOMY  2001  . BACK SURGERY     FOR SCOLIOSIS-HERRINGTON RODS IN PLACE  . BREAST BIOPSY Bilateral    08/17/2015   . BREAST RECONSTRUCTION Bilateral   . PORTACATH PLACEMENT N/A 08/30/2015   Procedure: INSERTION PORT-A-CATH;  Surgeon: Leonie Green, MD;  Location: [facility] ORS;  Service: General;  Laterality: N/A;  . SENTINEL NODE BIOPSY Bilateral 03/08/2016   Procedure: SENTINEL NODE BIOPSY;  Surgeon: Leonie Green, MD;  Location: [facility] ORS;  Service: General;  Laterality: Bilateral;  . SIMPLE MASTECTOMY WITH AXILLARY SENTINEL NODE BIOPSY Bilateral 03/08/2016   Procedure: SIMPLE MASTECTOMY;  Surgeon: Leonie Green, MD;  Location: [facility] ORS;  Service: General;  Laterality: Bilateral;  . TONSILLECTOMY      Family History  Problem Relation Age of Onset  . Breast cancer Maternal Aunt 64  . Cancer Father   . Cancer Sister   . Cancer Maternal Grandfather   . Colon cancer Neg Hx   . Esophageal cancer Neg Hx   . Rectal  cancer Neg Hx   . Stomach cancer Neg Hx     Social History:  reports that she quit smoking about 20 years ago. Her smoking use included cigarettes. She has a 10.00 pack-year smoking history. She has never used smokeless tobacco. She reports current alcohol use. She reports that she does not use drugs. Her husband states that she drinks 2-4 bottles of wine/week. She returned to full-time work on 10/24/2016. Her daughter was married in 01/2017.Her daughter will have her first grandchild in the next few weeks.She works out on the treadmill 3 days/week.She is married to her husband, Barbaraann Rondo. She lives in [place]. The patient is alone today.   Allergies:   Allergies  Allergen Reactions  . Ceftin [Cefuroxime Axetil] Rash    Current Medications: Current Outpatient Medications  Medication Sig Dispense Refill  . ALPRAZolam (XANAX) 0.25 MG tablet Take 0.0625 mg by mouth 3 (three) times daily as needed for anxiety.    Marland Kitchen BIOTIN PO Take by mouth.    . Calcium Carbonate-Vitamin D3 600-400 MG-UNIT TABS Take 1 tablet by mouth daily.     . hydrochlorothiazide (HYDRODIURIL) 12.5 MG tablet Take 12.5 mg by mouth daily.    . Investigational donepezil/placebo tablet WFU [id number] Take 2 tablets by mouth daily. Take at the same time every day; can be with or without food. 252 tablet 0  . letrozole (FEMARA) 2.5 MG tablet TAKE 1 TABLET BY MOUTH DAILY. 90 tablet 2  . linaclotide (LINZESS) 290 MCG CAPS capsule Take 290 mcg by mouth as needed.    . magnesium citrate SOLN Take 1 Bottle by mouth once.    . Multiple Vitamin (MULTIVITAMIN) capsule Take 1 capsule by mouth daily.    . polyethylene glycol powder (GLYCOLAX/MIRALAX) powder Take 1 Container by mouth daily.    . propranolol (INDERAL) 20 MG tablet Take 20 mg by mouth 2 (two) times daily.    Marland Kitchen venlafaxine XR (EFFEXOR-XR) 37.5 MG 24 hr capsule TAKE 1 CAPSULE BY MOUTH DAILY WITH BREAKFAST. 90 capsule 0   No current facility-administered medications for this visit.    Review of Systems  Constitutional: Positive for diaphoresis (hot flashes), malaise/fatigue (mild) and weight loss (2 lbs). Negative for chills and fever.       Doing well. Active.  HENT: Negative.  Negative for congestion, hearing loss, nosebleeds, sinus pain, sore throat and tinnitus.   Eyes: Negative.  Negative for blurred vision, double vision, photophobia and pain.  Respiratory: Negative.  Negative for cough, sputum production, shortness of breath and wheezing.   Cardiovascular: Positive for palpitations. Negative for chest pain, orthopnea, leg swelling and PND.  Gastrointestinal: Negative.  Negative for abdominal pain, blood in stool,  constipation, diarrhea, melena, nausea and vomiting.  Genitourinary: Negative.  Negative for dysuria, frequency, hematuria and urgency.  Musculoskeletal: Negative.  Negative for back pain, joint pain and myalgias.  Skin: Negative.  Negative for rash.  Neurological: Negative for dizziness, tremors, sensory change (neuropathy fingers > toes; stable), speech change, focal weakness, loss of consciousness, weakness and headaches.  Endo/Heme/Allergies: Negative.  Negative for environmental allergies. Does not bruise/bleed easily.  Psychiatric/Behavioral: Negative for depression, memory loss (improved) and substance abuse. The patient is nervous/anxious and has insomnia (takes Xanax and Benadryl).   All other systems reviewed and are negative.  Performance status (ECOG): 1 - Symptomatic but completely ambulatory   Blood pressure 132/68, pulse 73, temperature (!) 96.9 F (36.1 C), temperature source Tympanic, resp. rate 16, weight 117 lb 13.4 oz (53.4 kg), SpO2  100 %.   Physical Exam Vitals and nursing note reviewed.  Constitutional:      General: She is not in acute distress.    Appearance: Normal appearance. She is well-developed. She is not diaphoretic.     Interventions: Face mask in place.  HENT:     Head: Normocephalic and atraumatic.     Comments: Long blonde hair.    Mouth/Throat:     Mouth: Mucous membranes are moist. No oral lesions.  Eyes:     General: No scleral icterus.    Conjunctiva/sclera: Conjunctivae normal.     Pupils: Pupils are equal, round, and reactive to light.     Comments: Blue eyes.  Cardiovascular:     Rate and Rhythm: Normal rate and regular rhythm.     Heart sounds: Normal heart sounds. No murmur heard.  No friction rub. No gallop.   Pulmonary:     Effort: Pulmonary effort is normal.     Breath sounds: Normal breath sounds. No wheezing, rhonchi or rales.  Chest:     Comments: Bilateral mastectomy and implants.  No erythema or nodularity. Abdominal:      General: Bowel sounds are normal.     Palpations: Abdomen is soft. There is no hepatomegaly, splenomegaly or mass.     Tenderness: There is no abdominal tenderness.  Musculoskeletal:        General: No tenderness. Normal range of motion.     Cervical back: Normal range of motion and neck supple.  Lymphadenopathy:     Cervical: No cervical adenopathy.     Right cervical: No superficial cervical adenopathy. Skin:    General: Skin is warm and dry.     Findings: No bruising, erythema, lesion or rash.  Neurological:     Mental Status: She is alert and oriented to person, place, and time.  Psychiatric:        Speech: Speech normal.        Behavior: Behavior normal.        Thought Content: Thought content normal.        Judgment: Judgment normal.    Appointment on 05/05/2020  Component Date Value Ref Range Status  . Sodium 05/05/2020 138  135 - 145 mmol/L Final  . Potassium 05/05/2020 4.2  3.5 - 5.1 mmol/L Final  . Chloride 05/05/2020 100  98 - 111 mmol/L Final  . CO2 05/05/2020 25  22 - 32 mmol/L Final  . Glucose, Bld 05/05/2020 100* 70 - 99 mg/dL Final   Glucose reference range applies only to samples taken after fasting for at least 8 hours.  . BUN 05/05/2020 17  6 - 20 mg/dL Final  . Creatinine, Ser 05/05/2020 0.78  0.44 - 1.00 mg/dL Final  . Calcium 05/05/2020 9.4  8.9 - 10.3 mg/dL Final  . Total Protein 05/05/2020 7.3  6.5 - 8.1 g/dL Final  . Albumin 05/05/2020 4.4  3.5 - 5.0 g/dL Final  . AST 05/05/2020 25  15 - 41 U/L Final  . ALT 05/05/2020 22  0 - 44 U/L Final  . Alkaline Phosphatase 05/05/2020 77  38 - 126 U/L Final  . Total Bilirubin 05/05/2020 0.7  0.3 - 1.2 mg/dL Final  . GFR calc non Af Amer 05/05/2020 >60  >60 mL/min Final  . GFR calc Af Amer 05/05/2020 >60  >60 mL/min Final  . Anion gap 05/05/2020 13  5 - 15 Final   Performed at [facility], [address] [place], [place] [address]  .  WBC 05/05/2020 10.9* 4.0 - 10.5 K/uL Final  . RBC 05/05/2020  4.11  3.87 - 5.11 MIL/uL Final  . Hemoglobin 05/05/2020 12.7  12.0 - 15.0 g/dL Final  . HCT 05/05/2020 39.0  36 - 46 % Final  . MCV 05/05/2020 94.9  80.0 - 100.0 fL Final  . MCH 05/05/2020 30.9  26.0 - 34.0 pg Final  . MCHC 05/05/2020 32.6  30.0 - 36.0 g/dL Final  . RDW 05/05/2020 13.5  11.5 - 15.5 % Final  . Platelets 05/05/2020 362  150 - 400 K/uL Final  . nRBC 05/05/2020 0.0  0.0 - 0.2 % Final  . Neutrophils Relative % 05/05/2020 77  % Final  . Neutro Abs 05/05/2020 8.4* 1.7 - 7.7 K/uL Final  . Lymphocytes Relative 05/05/2020 14  % Final  . Lymphs Abs 05/05/2020 1.5  0.7 - 4.0 K/uL Final  . Monocytes Relative 05/05/2020 8  % Final  . Monocytes Absolute 05/05/2020 0.8  0 - 1 K/uL Final  . Eosinophils Relative 05/05/2020 1  % Final  . Eosinophils Absolute 05/05/2020 0.1  0 - 0 K/uL Final  . Basophils Relative 05/05/2020 0  % Final  . Basophils Absolute 05/05/2020 0.0  0 - 0 K/uL Final  . Immature Granulocytes 05/05/2020 0  % Final  . Abs Immature Granulocytes 05/05/2020 0.03  0.00 - 0.07 K/uL Final   Performed at [facility], [address] [place], [place] [address]    Assessment:  Emma Atkinson is a 54 y.o. female with clinical stage T2N1 (stage IIB) Her2/neu + right breast cancer s/p neoadjuvant chemotherapy and bilateral mastectomy. She completed a year of adjuvant Herceptin.  She presented with a minimally painful right breast mass with nipple inversion. Bilateral mammogram and ultrasound on 08/12/2015 revealed a 4.1 cm right superior breast mass containing pleomorphic microcalcifications. There were several satellite lesions (1.3 cm, 0.9 cm, 0.6 cm) plus a 1.3 cm mass versus complicated cyst and a 0.8 cm cyst. There was one 5 mm right axillary node with irregular thickened cortex.   Core needle biopsy on 08/17/2015 of the right breast mass revealed a grade 3 invasive mammary carcinoma. Right axillary biopsy confirmed metastatic mammary carcinoma. Tumor was ER  positive (1-10%), PR positive (11-50%) and HER-2/neu 3+  PET scanon 08/26/2015 revealed hypermetabolic right breast mass (SUV 11.3) with a hypermetabolic right axillary lymph node and a subtle focus of very faint hypermetabolic activity laterally in the right breast. There was faint hypermetabolic activity at the biopsy site of the left breast (? biopsy related).   CA27.29has been followed: 19.6 on 08/22/2015, 24.7 on 01/10/2016, 15.5 on 03/23/2016, 16.3 on 12/06/2016, 17.4 on 03/05/2017, 11.7 on 04/25/2017, 15.4 on 07/01/2017, 12.9 on 10/28/2017, 18.0 on 03/20/2018, 15.0 on 09/23/2018, 13.2 on 07/22/2019, 19.9 on 11/04/2019, and 16.9 on 05/05/2020.  She received 4 cycles of AC(09/13/2015 - 11/08/2015) with Neulasta support. She received 4 cycles of Herceptin and Perjeta(11/29/2015 - 01/31/2016) and 12 weeks of Taxol(11/29/2015 - 02/14/2016). She tolerated her chemotherapy well. She developed a grade I neuropathy. She completed a year of adjuvant Herceptinon 11/15/2016. She began tamoxifenon 07/05/2016(held 07/23/2019).   She was on neratinib from 12/06/2016 - 01/01/2017. She had significant diarrhea affecting quality of life. She has ssues with constipation (baseline on Miralax). She was on Effexorfor vasomotor symptoms (discontinued in 01/2018).  Bilateral mastectomyon 03/08/2016 revealed no residual disease in the right breast. Five lymph nodes were negative for malignancy. Pathologic stage was ypT0 ypN0. Left breast revealed benign  intraductal papillomas and cysts adjacent to the biopsy site. There was no atypia or malignancy. She underwent reconstructionin 09/2016 or 10/2016 then implant removal in 08/2017 secondary to infection.   She underwent right breast reconstructionwith latissimus flap and insertion of tissue expander and left breast reconstruction with insertion of tissue expander and removal of right posterior arm skin tag on 04/04/2018. She underwent  second stage breast reconstructionwith exchange and touch up with fat grafting on 06/27/2018.She underwent ascar revision and left breast lifton 10/03/2018  She received 50.4 Gyright chest wall radiation from 05/07/2016 - 06/25/2016. She underwent reconstructive surgery on 10/10/2016. She completed reconstruction on 01/11/2017.Shebegantamoxifenon 07/05/2016. She discontinued tamoxifen on 07/22/2019 and began Femara on 08/06/2019.  She is s/p partial hysterectomy(ovaries remain) in 2001. She was premenopausal (Lolo 5 and estradiol 271.9) on 08/22/2015. She is post-menopauasal (WEX93.7JIR estradiol <5.0) on10/04/2019.  Echoon 08/26/2015 revealed an ejection fraction of 55-60%. Echo on 11/25/2015 revealed an EF of 60-65%. Echo on 02/23/2016 and 05/25/2016 revealed an EF of 55-65%. Echo on 08/31/2016 revealed an EF of 50-55%. Echo on 11/29/2016 revealed an EF of 60-65%.  She has a family history of breast and ovarian cancer. My Risk genetic testingrevealed no mutation on 08/22/2015.  Colonoscopyon 09/25/2018 revealed mild diverticulosis in the sigmoid colon, descending colon, and ascending colon.There was one 2 mm polyp in the proximal ascending colon(sessile serrated polyp:no high grade dysplasia or malignancy).   Bone density on 10/28/2018 revealed osteopeniawith a T-score of -1.1 at the femur left neck.  She has short term memoryissues. Head CTon 07/24/2019 revealedno evidence ofmetastatic disease.  She is on the Remember study ([facility] 97116).  Her memory has improved significantly.  She had fer first COVID-19 vaccine last week.   Symptomatically, she is doing well.  Neuropathy is stable.  Memory has improved.  Exam reveals no evidence of recurrent disease.  Plan: 1.   Labs today:  CBC with diff, CMP, CA27.29. 2.   Stage IIB Her2/neu + right breast cancer Clinically,she is doing well Exam reveals no evidence of recurrent  disease. She was on tamoxifen from  07/05/2016 - 07/22/2019. She continues Femara (began 08/06/2019).  Continue every 66-monthsurveillance. 3.Cognitive concerns She  has had short term memory issues s/p chemotherapy. Head CT was negative. She enrolled on the Memory study ([facility] 97116).  Cognition has improved.  She is on the washout period with reassessment on 07/19/2020. 4.   Osteopenia Continue calcium and vitamin D.  Next bone density study due on 10/28/2020. 5.Vasomotor symptoms  She continues to have vasomotor symptoms on Effexor XR 37.5 mg a day.  Patient declines increase in dose. 6.   RTC in 6 months for MD assessment and labs (CBC with diff, CMP, CA 27.29).  I discussed the assessment and treatment plan with the patient.  The patient was provided an opportunity to ask questions and all were answered.  The patient agreed with the plan and demonstrated an understanding of the instructions.  The patient was advised to call back if the symptoms worsen or if the condition fails to improve as anticipated.  I provided 32 minutes (9:54 AM - 10:22 AM) of face-to-face time during this this encounter and > 50% was spent counseling as documented under my assessment and plan.     MLequita Asal MD, PhD    05/05/2020, 10:22 AM  I, AJacqualyn Posey am acting as a sEducation administratorfor [employer] CMike Gip MD.   I, Damica Gravlin C. CMike Gip MD, have reviewed the above documentation for accuracy  and completeness, and I agree with the above.

## 2020-05-04 NOTE — Progress Notes (Signed)
Patient was called for pre assessment. She denies any pain at this time. States she would like to discuss letrozole with provider at appointment tomorrow. She states she has recently had problems with high blood pressure and some anxiety since starting letrozole. Currently taking bp medication and some alprazolam for anxiety. No other concerns at this time.

## 2020-05-05 ENCOUNTER — Other Ambulatory Visit: Payer: Self-pay

## 2020-05-05 ENCOUNTER — Encounter: Payer: Self-pay | Admitting: Hematology and Oncology

## 2020-05-05 ENCOUNTER — Ambulatory Visit: Payer: 59 | Admitting: Hematology and Oncology

## 2020-05-05 ENCOUNTER — Other Ambulatory Visit: Payer: Self-pay | Admitting: Hematology and Oncology

## 2020-05-05 ENCOUNTER — Inpatient Hospital Stay (HOSPITAL_BASED_OUTPATIENT_CLINIC_OR_DEPARTMENT_OTHER): Payer: 59 | Admitting: Hematology and Oncology

## 2020-05-05 ENCOUNTER — Inpatient Hospital Stay: Payer: 59 | Attending: Hematology and Oncology

## 2020-05-05 ENCOUNTER — Other Ambulatory Visit: Payer: 59

## 2020-05-05 VITALS — BP 132/68 | HR 73 | Temp 96.9°F | Resp 16 | Wt 117.8 lb

## 2020-05-05 DIAGNOSIS — Z79811 Long term (current) use of aromatase inhibitors: Secondary | ICD-10-CM | POA: Diagnosis not present

## 2020-05-05 DIAGNOSIS — D122 Benign neoplasm of ascending colon: Secondary | ICD-10-CM | POA: Diagnosis not present

## 2020-05-05 DIAGNOSIS — Z79810 Long term (current) use of selective estrogen receptor modulators (SERMs): Secondary | ICD-10-CM | POA: Insufficient documentation

## 2020-05-05 DIAGNOSIS — C50111 Malignant neoplasm of central portion of right female breast: Secondary | ICD-10-CM | POA: Insufficient documentation

## 2020-05-05 DIAGNOSIS — F329 Major depressive disorder, single episode, unspecified: Secondary | ICD-10-CM | POA: Insufficient documentation

## 2020-05-05 DIAGNOSIS — Z87891 Personal history of nicotine dependence: Secondary | ICD-10-CM | POA: Diagnosis not present

## 2020-05-05 DIAGNOSIS — Z881 Allergy status to other antibiotic agents status: Secondary | ICD-10-CM | POA: Diagnosis not present

## 2020-05-05 DIAGNOSIS — Z803 Family history of malignant neoplasm of breast: Secondary | ICD-10-CM | POA: Insufficient documentation

## 2020-05-05 DIAGNOSIS — R079 Chest pain, unspecified: Secondary | ICD-10-CM | POA: Diagnosis not present

## 2020-05-05 DIAGNOSIS — Z8041 Family history of malignant neoplasm of ovary: Secondary | ICD-10-CM | POA: Insufficient documentation

## 2020-05-05 DIAGNOSIS — Z79899 Other long term (current) drug therapy: Secondary | ICD-10-CM | POA: Insufficient documentation

## 2020-05-05 DIAGNOSIS — Z9221 Personal history of antineoplastic chemotherapy: Secondary | ICD-10-CM | POA: Insufficient documentation

## 2020-05-05 DIAGNOSIS — G629 Polyneuropathy, unspecified: Secondary | ICD-10-CM | POA: Insufficient documentation

## 2020-05-05 DIAGNOSIS — Z170 Estrogen receptor positive status [ER+]: Secondary | ICD-10-CM

## 2020-05-05 DIAGNOSIS — M85852 Other specified disorders of bone density and structure, left thigh: Secondary | ICD-10-CM | POA: Diagnosis not present

## 2020-05-05 DIAGNOSIS — F419 Anxiety disorder, unspecified: Secondary | ICD-10-CM | POA: Diagnosis not present

## 2020-05-05 DIAGNOSIS — Z9013 Acquired absence of bilateral breasts and nipples: Secondary | ICD-10-CM | POA: Insufficient documentation

## 2020-05-05 DIAGNOSIS — R61 Generalized hyperhidrosis: Secondary | ICD-10-CM | POA: Diagnosis not present

## 2020-05-05 DIAGNOSIS — N6012 Diffuse cystic mastopathy of left breast: Secondary | ICD-10-CM | POA: Insufficient documentation

## 2020-05-05 DIAGNOSIS — Z809 Family history of malignant neoplasm, unspecified: Secondary | ICD-10-CM | POA: Diagnosis not present

## 2020-05-05 DIAGNOSIS — R413 Other amnesia: Secondary | ICD-10-CM | POA: Diagnosis not present

## 2020-05-05 DIAGNOSIS — N951 Menopausal and female climacteric states: Secondary | ICD-10-CM | POA: Diagnosis not present

## 2020-05-05 DIAGNOSIS — N644 Mastodynia: Secondary | ICD-10-CM | POA: Diagnosis not present

## 2020-05-05 DIAGNOSIS — M8580 Other specified disorders of bone density and structure, unspecified site: Secondary | ICD-10-CM | POA: Diagnosis not present

## 2020-05-05 DIAGNOSIS — R232 Flushing: Secondary | ICD-10-CM | POA: Diagnosis not present

## 2020-05-05 DIAGNOSIS — K5730 Diverticulosis of large intestine without perforation or abscess without bleeding: Secondary | ICD-10-CM | POA: Diagnosis not present

## 2020-05-05 DIAGNOSIS — C50811 Malignant neoplasm of overlapping sites of right female breast: Secondary | ICD-10-CM

## 2020-05-05 LAB — COMPREHENSIVE METABOLIC PANEL
ALT: 22 U/L (ref 0–44)
AST: 25 U/L (ref 15–41)
Albumin: 4.4 g/dL (ref 3.5–5.0)
Alkaline Phosphatase: 77 U/L (ref 38–126)
Anion gap: 13 (ref 5–15)
BUN: 17 mg/dL (ref 6–20)
CO2: 25 mmol/L (ref 22–32)
Calcium: 9.4 mg/dL (ref 8.9–10.3)
Chloride: 100 mmol/L (ref 98–111)
Creatinine, Ser: 0.78 mg/dL (ref 0.44–1.00)
GFR calc Af Amer: >60 mL/min (ref >60)
GFR calc non Af Amer: >60 mL/min (ref >60)
Glucose, Bld: 100 mg/dL — ABNORMAL HIGH (ref 70–99)
Potassium: 4.2 mmol/L (ref 3.5–5.1)
Sodium: 138 mmol/L (ref 135–145)
Total Bilirubin: 0.7 mg/dL (ref 0.3–1.2)
Total Protein: 7.3 g/dL (ref 6.5–8.1)

## 2020-05-05 LAB — CBC WITH DIFFERENTIAL/PLATELET
Abs Immature Granulocytes: 0.03 10*3/uL (ref 0.00–0.07)
Basophils Absolute: 0 10*3/uL (ref 0.0–0.1)
Basophils Relative: 0 %
Eosinophils Absolute: 0.1 10*3/uL (ref 0.0–0.5)
Eosinophils Relative: 1 %
HCT: 39 % (ref 36.0–46.0)
Hemoglobin: 12.7 g/dL (ref 12.0–15.0)
Immature Granulocytes: 0 %
Lymphocytes Relative: 14 %
Lymphs Abs: 1.5 10*3/uL (ref 0.7–4.0)
MCH: 30.9 pg (ref 26.0–34.0)
MCHC: 32.6 g/dL (ref 30.0–36.0)
MCV: 94.9 fL (ref 80.0–100.0)
Monocytes Absolute: 0.8 10*3/uL (ref 0.1–1.0)
Monocytes Relative: 8 %
Neutro Abs: 8.4 10*3/uL — ABNORMAL HIGH (ref 1.7–7.7)
Neutrophils Relative %: 77 %
Platelets: 362 10*3/uL (ref 150–400)
RBC: 4.11 MIL/uL (ref 3.87–5.11)
RDW: 13.5 % (ref 11.5–15.5)
WBC: 10.9 10*3/uL — ABNORMAL HIGH (ref 4.0–10.5)
nRBC: 0 % (ref 0.0–0.2)

## 2020-05-06 LAB — CANCER ANTIGEN 27.29: CA 27.29: 16.9 U/mL (ref 0.0–38.6)

## 2020-06-08 DIAGNOSIS — Z01419 Encounter for gynecological examination (general) (routine) without abnormal findings: Secondary | ICD-10-CM | POA: Diagnosis not present

## 2020-06-15 ENCOUNTER — Encounter: Payer: Self-pay | Admitting: Hematology and Oncology

## 2020-06-15 ENCOUNTER — Other Ambulatory Visit: Payer: Self-pay | Admitting: Hematology and Oncology

## 2020-07-19 ENCOUNTER — Encounter: Payer: Self-pay | Admitting: Hematology and Oncology

## 2020-07-19 DIAGNOSIS — C50811 Malignant neoplasm of overlapping sites of right female breast: Secondary | ICD-10-CM

## 2020-07-19 NOTE — Research (Addendum)
[facility] [id number] Remember 36 Week Assessment:  Patient in to the cancer center this am to meet with research RN for her 36 week assessment per W[facility] [address]116 protocol requirements. She is unaccompanied for this visit. ECOG-0. The patient completed her IP on 04/30/2020 and is here for her wash out visit from study drug. She denies any changes or problems since her last visit. She has stopped taking her black cohosh, trazodone and linzess as the medications did not help what she was taking them for. The patient completed all of her questionnaires without any difficulty within 15 minutes. The 36 week assessment was administered in the research office and consisted of the [facility] verbal learning test, trail making test 100 % accuracy, controlled oral word association test, digit span test (backward), digit symbol coding, and the [facility] verbal learning test revised. The patient states she recently saw Dr. Mike Gip with no changes, she continues to take her letrozole. She has no adverse events to report currently. The patient was thanked for her participation in  the clinical trial and encouraged to call for any questions or concerns she may have.  Jeral Fruit, RN, BSN, OCN Date: 07/19/2020 Time:  1400 pm    Emma Atkinson [id number]  07/19/2020  Adverse Event Log  Study/Protocol: SWOG P7106 Cycle: 24 Week Assessment  Event Grade Onset Date Resolved Date Drug Name Attribution Treatment Comments  Chest Pain  1 03/22/2020 07/19/2020 Donepezil / Placebo IP Not related ECG, medication review MD note states r/t anxiety or Effexor XR / Letrozole usage  Anxiety 2 03/22/2020 07/19/2020 Donepezil / Placebo IP Not related Medication Review   Hypertension  2 03/22/2020 07/19/2020 Donepezil / Placebo IP Not related ECG, medication review    MD note states r/t anxiety or Effexor XR / Letrozole                             Jeral Fruit, RN, BSN, OCN 04/29/2020 1520 pm

## 2020-08-03 ENCOUNTER — Other Ambulatory Visit: Payer: Self-pay | Admitting: Internal Medicine

## 2020-08-03 DIAGNOSIS — Z13220 Encounter for screening for lipoid disorders: Secondary | ICD-10-CM | POA: Diagnosis not present

## 2020-08-03 DIAGNOSIS — C50811 Malignant neoplasm of overlapping sites of right female breast: Secondary | ICD-10-CM | POA: Diagnosis not present

## 2020-08-03 DIAGNOSIS — Z79899 Other long term (current) drug therapy: Secondary | ICD-10-CM | POA: Diagnosis not present

## 2020-08-03 DIAGNOSIS — F419 Anxiety disorder, unspecified: Secondary | ICD-10-CM | POA: Diagnosis not present

## 2020-08-03 DIAGNOSIS — R7309 Other abnormal glucose: Secondary | ICD-10-CM | POA: Diagnosis not present

## 2020-08-03 DIAGNOSIS — I10 Essential (primary) hypertension: Secondary | ICD-10-CM | POA: Diagnosis not present

## 2020-08-08 ENCOUNTER — Ambulatory Visit: Payer: 59 | Admitting: Plastic Surgery

## 2020-08-08 ENCOUNTER — Other Ambulatory Visit: Payer: Self-pay

## 2020-08-08 ENCOUNTER — Encounter: Payer: Self-pay | Admitting: Plastic Surgery

## 2020-08-08 VITALS — BP 160/92 | HR 67 | Temp 98.2°F | Ht 60.0 in | Wt 121.4 lb

## 2020-08-08 DIAGNOSIS — Z170 Estrogen receptor positive status [ER+]: Secondary | ICD-10-CM

## 2020-08-08 DIAGNOSIS — C50811 Malignant neoplasm of overlapping sites of right female breast: Secondary | ICD-10-CM | POA: Diagnosis not present

## 2020-08-08 NOTE — Progress Notes (Signed)
Referring Provider Idelle Crouch, MD [facility] [place],  [place] [address]   CC:  Chief Complaint  Patient presents with  . Consult      Emma Atkinson is an 54 y.o. female.  HPI: Patient presents to discuss her revision of her breast reconstruction.  Her current complaint is in regards to the right implant migrating laterally underneath her arm.  She was initially diagnosed with a right-sided breast cancer about 5 years ago.  She underwent neoadjuvant chemotherapy.  This was followed by bilateral mastectomy with no immediate reconstruction.  She subsequently had radiation to the right chest wall as adjuvant treatment.  She then underwent a first attempt at expander based reconstruction but ultimately the right side failed and all the implants were removed.  After that she had a second attempt at reconstruction this time using a right sided latissimus flap to cover the expander on the right and a new expander was placed on the left.  This ended up with a successful implant-based reconstruction.  She currently has a 210 cc Natrelle Inspira SCM implant with 210 cc of volume on the left side.  On the right side she has a similar implant with 255 cc of volume.  She feels like over the past several months the right implant has migrated out laterally.  This is affecting the posture on her right side if she has pain putting down her right arm against her side.  Allergies  Allergen Reactions  . Ceftin [Cefuroxime Axetil] Rash    Outpatient Encounter Medications as of 08/08/2020  Medication Sig Note  . ALPRAZolam (XANAX) 0.25 MG tablet Take 0.0625 mg by mouth 3 (three) times daily as needed for anxiety.   Marland Kitchen amLODipine (NORVASC) 5 MG tablet Take by mouth.   Marland Kitchen BIOTIN PO Take by mouth.   . Calcium Carbonate-Vitamin D3 600-400 MG-UNIT TABS Take 1 tablet by mouth daily.    Marland Kitchen gabapentin (NEURONTIN) 300 MG capsule Take by mouth.   . hydrochlorothiazide (HYDRODIURIL) 12.5  MG tablet Take 12.5 mg by mouth daily.   . Investigational donepezil/placebo tablet WFU [id number] Take 2 tablets by mouth daily. Take at the same time every day; can be with or without food. 02/11/2020: Erroneously released late, see Research Encounter note for actual dispensing date.  Jeral Fruit, RN, BSN, OCN    . letrozole (Chloride) 2.5 MG tablet TAKE 1 TABLET BY MOUTH DAILY.   Marland Kitchen linaclotide (LINZESS) 290 MCG CAPS capsule Take 290 mcg by mouth as needed.   . magnesium citrate SOLN Take 1 Bottle by mouth once.   . Multiple Vitamin (MULTIVITAMIN) capsule Take 1 capsule by mouth daily.   . polyethylene glycol powder (GLYCOLAX/MIRALAX) powder Take 1 Container by mouth daily.   . propranolol (INDERAL) 20 MG tablet Take 20 mg by mouth 2 (two) times daily.   Marland Kitchen venlafaxine XR (EFFEXOR-XR) 37.5 MG 24 hr capsule TAKE 1 CAPSULE BY MOUTH DAILY WITH BREAKFAST.    No facility-administered encounter medications on file as of 08/08/2020.     Past Medical History:  Diagnosis Date  . Allergy   . Anemia    H/O  . Anxiety   . Blood transfusion without reported diagnosis   . Breast cancer ([place])    breast  . Chronic kidney disease 12-2015   PYLONEPHRITIS  . Complication of anesthesia    HARD TO WAKE UP AFTER TONSILLECTOMY AS A CHILD BUT NO PROBLEMS SINCE  . Complication of anesthesia  PT STATES THAT BACK IN OR DURING PORT PLACMENT IN 2016 THAT SHE BECAME VEERY FLUSHED, LIGHT HEADED AND CRNA SAID ANTIBIOTIC MAY BE GOING IN IN TO FAST-RATE DECREASED AND PTS SYMPTOMS RESOLVED IMMEDIATELY  . Depression   . GERD (gastroesophageal reflux disease)   . Hypertension   . Shortness of breath dyspnea     Past Surgical History:  Procedure Laterality Date  . ABDOMINAL HYSTERECTOMY  2001  . BACK SURGERY     FOR SCOLIOSIS-HERRINGTON RODS IN PLACE  . BREAST BIOPSY Bilateral    08/17/2015   . BREAST RECONSTRUCTION Bilateral   . PORTACATH PLACEMENT N/A 08/30/2015   Procedure: INSERTION PORT-A-CATH;  Surgeon:  Leonie Green, MD;  Location: [facility] ORS;  Service: General;  Laterality: N/A;  . SENTINEL NODE BIOPSY Bilateral 03/08/2016   Procedure: SENTINEL NODE BIOPSY;  Surgeon: Leonie Green, MD;  Location: [facility] ORS;  Service: General;  Laterality: Bilateral;  . SIMPLE MASTECTOMY WITH AXILLARY SENTINEL NODE BIOPSY Bilateral 03/08/2016   Procedure: SIMPLE MASTECTOMY;  Surgeon: Leonie Green, MD;  Location: [facility] ORS;  Service: General;  Laterality: Bilateral;  . TONSILLECTOMY      Family History  Problem Relation Age of Onset  . Breast cancer Maternal Aunt 64  . Cancer Father   . Cancer Sister   . Cancer Maternal Grandfather   . Colon cancer Neg Hx   . Esophageal cancer Neg Hx   . Rectal cancer Neg Hx   . Stomach cancer Neg Hx     Social History   Social History Narrative  . Not on file  Denies tobacco use  Review of Systems General: Denies fevers, chills, weight loss CV: Denies chest pain, shortness of breath, palpitations  Physical Exam Vitals with BMI 08/08/2020 05/05/2020 03/10/2020  Height 5\' 0"  - -  Weight 121 lbs 6 oz 117 lbs 13 oz 118 lbs 6 oz  BMI 54.62 - -  Systolic 703 500 938  Diastolic 92 68 73  Pulse 67 73 59  Some encounter information is confidential and restricted. Go to Review Flowsheets activity to see all data.    General:  No acute distress,  Alert and oriented, Non-Toxic, Normal speech and affect On exam she has an overall nice reconstruction.  There is evidence of latissimus flap on the right side with a healthy-appearing skin paddle.  She does appear to have a bit more volume on the right side compared to the left in total.  The implant is shifting a bit towards the axilla for several centimeters corresponding to her complaint.  Otherwise she has a nice smooth contour.  Assessment/Plan Patient presents with the complaint of the implant of her right breast reconstruction migrating laterally.  I explained to fix this she would need a capsulorrhaphy  and I would likely place Flex HD in the pocket to reinforce this to prevent it from coming back.  I would then probably place a slightly smaller implant to accommodate for the higher volume on that side especially if the pocket is going to be a bit smaller on a radiated breast.  We discussed the risk that include bleeding, infection, damage to surrounding structures and need for additional procedures.  All of her questions were answered and we will aim to set this up for the first of the year.  Cindra Presume 08/08/2020, 12:29 PM

## 2020-08-22 ENCOUNTER — Encounter: Payer: Self-pay | Admitting: Plastic Surgery

## 2020-09-05 ENCOUNTER — Other Ambulatory Visit: Payer: Self-pay | Admitting: Hematology and Oncology

## 2020-09-05 DIAGNOSIS — N289 Disorder of kidney and ureter, unspecified: Secondary | ICD-10-CM | POA: Diagnosis not present

## 2020-09-05 DIAGNOSIS — C50111 Malignant neoplasm of central portion of right female breast: Secondary | ICD-10-CM

## 2020-09-05 DIAGNOSIS — Z170 Estrogen receptor positive status [ER+]: Secondary | ICD-10-CM

## 2020-09-13 ENCOUNTER — Other Ambulatory Visit: Payer: Self-pay | Admitting: Hematology and Oncology

## 2020-09-16 ENCOUNTER — Other Ambulatory Visit: Payer: Self-pay | Admitting: Internal Medicine

## 2020-09-16 ENCOUNTER — Ambulatory Visit: Payer: 59 | Attending: Internal Medicine

## 2020-09-16 DIAGNOSIS — Z23 Encounter for immunization: Secondary | ICD-10-CM

## 2020-09-16 NOTE — Progress Notes (Signed)
   Covid-19 Vaccination Clinic  Name:  Emma Atkinson    MRN: [id number] DOB: August 06, 1966  09/16/2020  [name] was observed post Covid-19 immunization for 15 minutes without incident. She was provided with Vaccine Information Sheet and instruction to access the V-Safe system.   [name] was instructed to call 911 with any severe reactions post vaccine: Marland Kitchen Difficulty breathing  . Swelling of face and throat  . A fast heartbeat  . A bad rash all over body  . Dizziness and weakness   Immunizations Administered    Name Date Dose VIS Date Route   Pfizer COVID-19 Vaccine 09/16/2020 10:13 AM 0.3 mL 08/03/2020 Intramuscular   Manufacturer: [place]   Lot: [id number]   [place]: [id number]    8

## 2020-09-23 ENCOUNTER — Ambulatory Visit: Payer: 59

## 2020-10-11 ENCOUNTER — Other Ambulatory Visit: Payer: Self-pay

## 2020-10-11 ENCOUNTER — Ambulatory Visit (INDEPENDENT_AMBULATORY_CARE_PROVIDER_SITE_OTHER): Payer: 59 | Admitting: Surgical

## 2020-10-11 ENCOUNTER — Encounter: Payer: Self-pay | Admitting: Surgical

## 2020-10-11 ENCOUNTER — Other Ambulatory Visit: Payer: Self-pay | Admitting: Surgical

## 2020-10-11 VITALS — BP 131/74 | HR 78 | Temp 98.2°F | Ht 60.0 in | Wt 122.6 lb

## 2020-10-11 DIAGNOSIS — C50811 Malignant neoplasm of overlapping sites of right female breast: Secondary | ICD-10-CM

## 2020-10-11 DIAGNOSIS — Z170 Estrogen receptor positive status [ER+]: Secondary | ICD-10-CM

## 2020-10-11 MED ORDER — ONDANSETRON HCL 4 MG PO TABS: 4.0000 mg | ORAL_TABLET | Freq: Three times a day (TID) | ORAL | 0 refills | Status: DC | PRN

## 2020-10-11 MED ORDER — HYDROCODONE-ACETAMINOPHEN 5-325 MG PO TABS: 1.0000 | ORAL_TABLET | Freq: Four times a day (QID) | ORAL | 0 refills | Status: DC | PRN

## 2020-10-11 NOTE — Progress Notes (Addendum)
Patient ID: Emma Atkinson, female    DOB: [birth year], 54 y.o.   MRN: [id number]  Chief Complaint  Patient presents with  . Pre-op Exam      ICD-10-CM   1. Carcinoma of overlapping sites of right breast in female, estrogen receptor positive (HCC)  C50.811    Z17.0     History of Present Illness: Emma Atkinson is a 53 y.o.  female  with a history of bilateral mastectomies followed by breast reconstruction and right latissimus myocutaneous muscle flap.  She presents for preoperative evaluation for upcoming procedure, revision of right breast reconstruction with lateral capsulorrhaphy with Flex HD and exchange for a new implant, scheduled for 10/25/2020 with Dr. Arita Miss  Addendum: She is also scheduled for exchange of the left breast implant at the same time.  Patient reports in the past she has had postoperative nausea, improved with scopolamine patch.  She also reports that in the past when having her port placed she had flushing sensation and lightheadedness when antibiotic was injected. No history of DVT/PE.  No family history of DVT/PE.  No family or personal history of bleeding or clotting disorders.  Patient is not currently taking any blood thinners.  No history of CVA/MI.    Summary of Previous Visit: Patient complains of the right implant migrating laterally underneath her arm.  She was initially diagnosed with right-sided breast cancer about 5 years ago and underwent neoadjuvant chemotherapy followed by bilateral mastectomies.  She then had delayed reconstruction after radiation to the right chest wall.  Unfortunately the right side failed at first attempt of reconstruction and implants were removed.  Patient then had a second attempt at reconstruction using a right latissimus flap to cover the expander on the right and a new expander was placed in the left.  Patient subsequently had a successful implant based reconstruction and currently has a 210 cc Natrelle Inspira SCM implant with 210  cc of volume on the left side and on the right she has a similar implant with 255 cc of volume.  Job: Works from home for [employer] Significant for: Anemia, breast cancer, GERD, hypertension Recent hemoglobin 12.7, MCV 94.9   Past Medical History: Allergies: Allergies  Allergen Reactions  . Ceftin [Cefuroxime Axetil] Rash    Current Medications:  Current Outpatient Medications:  .  ALPRAZolam (XANAX) 0.25 MG tablet, Take 0.0625 mg by mouth 3 (three) times daily as needed for anxiety., Disp: , Rfl:  .  amLODipine (NORVASC) 5 MG tablet, Take by mouth., Disp: , Rfl:  .  BIOTIN PO, Take by mouth., Disp: , Rfl:  .  Calcium Carbonate-Vitamin D3 600-400 MG-UNIT TABS, Take 1 tablet by mouth daily. , Disp: , Rfl:  .  gabapentin (NEURONTIN) 300 MG capsule, Take by mouth., Disp: , Rfl:  .  hydrochlorothiazide (HYDRODIURIL) 12.5 MG tablet, Take 12.5 mg by mouth daily., Disp: , Rfl:  .  HYDROcodone-acetaminophen (NORCO) 5-325 MG tablet, Take 1 tablet by mouth every 6 (six) hours as needed for up to 5 days for severe pain., Disp: 20 tablet, Rfl: 0 .  letrozole (FEMARA) 2.5 MG tablet, TAKE 1 TABLET BY MOUTH DAILY., Disp: 90 tablet, Rfl: 0 .  Multiple Vitamin (MULTIVITAMIN) capsule, Take 1 capsule by mouth daily., Disp: , Rfl:  .  ondansetron (ZOFRAN) 4 MG tablet, Take 1 tablet (4 mg total) by mouth every 8 (eight) hours as needed for nausea or vomiting., Disp: 20 tablet, Rfl: 0 .  polyethylene glycol  powder (GLYCOLAX/MIRALAX) powder, Take 1 Container by mouth daily., Disp: , Rfl:  .  propranolol (INDERAL) 20 MG tablet, Take 20 mg by mouth 2 (two) times daily., Disp: , Rfl:  .  venlafaxine XR (EFFEXOR-XR) 37.5 MG 24 hr capsule, TAKE 1 CAPSULE BY MOUTH DAILY WITH BREAKFAST., Disp: 90 capsule, Rfl: 0 .  Investigational donepezil/placebo tablet [id number], Take 2 tablets by mouth daily. Take at the same time every day; can be with or without food., Disp: 252 tablet, Rfl: 0 .  linaclotide (LINZESS) 290  MCG CAPS capsule, Take 290 mcg by mouth as needed., Disp: , Rfl:  .  magnesium citrate SOLN, Take 1 Bottle by mouth once., Disp: , Rfl:   Past Medical Problems: Past Medical History:  Diagnosis Date  . Allergy   . Anemia    H/O  . Anxiety   . Blood transfusion without reported diagnosis   . Breast cancer ([place])    breast  . Chronic kidney disease 12-2015   PYLONEPHRITIS  . Complication of anesthesia    HARD TO WAKE UP AFTER TONSILLECTOMY AS A CHILD BUT NO PROBLEMS SINCE  . Complication of anesthesia    PT STATES THAT BACK IN OR DURING PORT PLACMENT IN 2016 THAT SHE BECAME VEERY FLUSHED, LIGHT HEADED AND CRNA SAID ANTIBIOTIC MAY BE GOING IN IN TO FAST-RATE DECREASED AND PTS SYMPTOMS RESOLVED IMMEDIATELY  . Depression   . GERD (gastroesophageal reflux disease)   . Hypertension   . Shortness of breath dyspnea     Past Surgical History: Past Surgical History:  Procedure Laterality Date  . ABDOMINAL HYSTERECTOMY  2001  . BACK SURGERY     FOR SCOLIOSIS-HERRINGTON RODS IN PLACE  . BREAST BIOPSY Bilateral    08/17/2015   . BREAST RECONSTRUCTION Bilateral   . PORTACATH PLACEMENT N/A 08/30/2015   Procedure: INSERTION PORT-A-CATH;  Surgeon: Leonie Green, MD;  Location: [facility] ORS;  Service: General;  Laterality: N/A;  . SENTINEL NODE BIOPSY Bilateral 03/08/2016   Procedure: SENTINEL NODE BIOPSY;  Surgeon: Leonie Green, MD;  Location: [facility] ORS;  Service: General;  Laterality: Bilateral;  . SIMPLE MASTECTOMY WITH AXILLARY SENTINEL NODE BIOPSY Bilateral 03/08/2016   Procedure: SIMPLE MASTECTOMY;  Surgeon: Leonie Green, MD;  Location: [facility] ORS;  Service: General;  Laterality: Bilateral;  . TONSILLECTOMY      Social History: Social History   Socioeconomic History  . Marital status: Married    Spouse name: Not on file  . Number of children: Not on file  . Years of education: Not on file  . Highest education level: Not on file  Occupational History  . Not on file   Tobacco Use  . Smoking status: Former Smoker    Packs/day: 1.00    Years: 10.00    Pack years: 10.00    Types: Cigarettes    Quit date: 10/16/1999    Years since quitting: 21.0  . Smokeless tobacco: Never Used  Vaping Use  . Vaping Use: Never used  Substance and Sexual Activity  . Alcohol use: Yes    Comment: RARE WINE  . Drug use: No  . Sexual activity: Yes  Other Topics Concern  . Not on file  Social History Narrative  . Not on file   Social Determinants of Health   Financial Resource Strain: Not on file  Food Insecurity: Not on file  Transportation Needs: Not on file  Physical Activity: Not on file  Stress: Not on file  Social Connections: Not  on file  Intimate Partner Violence: Not on file    Family History: Family History  Problem Relation Age of Onset  . Breast cancer Maternal Aunt 64  . Cancer Father   . Cancer Sister   . Cancer Maternal Grandfather   . Colon cancer Neg Hx   . Esophageal cancer Neg Hx   . Rectal cancer Neg Hx   . Stomach cancer Neg Hx     Review of Systems: Review of Systems  Constitutional: Negative.   Respiratory: Negative.   Cardiovascular: Negative.   Musculoskeletal: Negative.   Neurological: Negative.     Physical Exam: Vital Signs BP 131/74 (BP Location: Left Arm, Patient Position: Sitting, Cuff Size: Normal)   Pulse 78   Temp 98.2 F (36.8 C) (Oral)   Ht 5' (1.524 m)   Wt 122 lb 9.6 oz (55.6 kg)   SpO2 95%   BMI 23.94 kg/m  Physical Exam Exam conducted with a chaperone present.  Constitutional:      General: She is not in acute distress.    Appearance: Normal appearance. She is not ill-appearing.  HENT:     Head: Normocephalic and atraumatic.  Eyes:     Pupils: Pupils are equal, round Neck:     Musculoskeletal: Normal range of motion.  Cardiovascular:     Rate and Rhythm: Normal rate and regular rhythm.     Pulses: Normal pulses.     Heart sounds: Normal heart sounds. No murmur.  Breast/chest: Right breast  incisions intact, latissimus myocutaneous skin paddle noted.  Good color.  Left breast incision intact.  Left chest wall port site incision noted.  No erythema noted.  Left breast more ptotic.  Superior pole of right breast fuller. Pulmonary:     Effort: Pulmonary effort is normal. No respiratory distress.     Breath sounds: Normal breath sounds. No wheezing.  Abdominal:     General: Abdomen is flat. There is no distension.     Palpations: Abdomen is soft.     Tenderness: There is no abdominal tenderness.  Musculoskeletal: Normal range of motion.  Skin:    General: Skin is warm and dry.     Findings: No erythema or rash.  Neurological:     General: No focal deficit present.     Mental Status: She is alert and oriented to person, place, and time. Mental status is at baseline.     Motor: No weakness.  Psychiatric:        Mood and Affect: Mood normal.        Behavior: Behavior normal.     Assessment/Plan: The patient is scheduled for revision of right breast reconstruction with lateral capsulorrhaphy with Flex HD and exchange for new implant with Dr. Claudia Desanctis.  Risks, benefits, and alternatives of procedure discussed, questions answered and consent obtained.    Smoking Status: Non-smoker, quit 25 years ago; Counseling Given?  N/A Last Mammogram: Bilateral mastectomies; Results: N/A  Caprini Score: 6, high; Risk Factors include: Age, history of breast cancer, currently on letrozole and length of planned surgery. Recommendation for mechanical and pharmacological prophylaxis.  Recommendation for 7 to 10 days of postoperative chemoprophylaxis. Encourage early ambulation.   Pictures obtained: Today  Post-op Rx sent to pharmacy: Zofran, Norco  Patient was provided with the breast reconstruction and General Surgical Risk consent document and Pain Medication Agreement prior to their appointment.  They had adequate time to read through the risk consent documents and Pain Medication Agreement. We  also discussed them  in person together during this preop appointment. All of their questions were answered to their satisfaction.  Recommended calling if they have any further questions.  Risk consent form and Pain Medication Agreement to be scanned into patient's chart.  The risks that can be encountered with and after placement of a breast implant were discussed and include the following but not limited to these: bleeding, infection, delayed healing, anesthesia risks, skin sensation changes, injury to structures including nerves, blood vessels, and muscles which may be temporary or permanent, allergies to tape, suture materials and glues, blood products, topical preparations or injected agents, skin contour irregularities, skin discoloration and swelling, deep vein thrombosis, cardiac and pulmonary complications, pain, which may persist, fluid accumulation, wrinkling of the skin over the implanmt, changes in nipple or breast sensation, implant leakage or rupture, faulty position of the implant, persistent pain, formation of tight scar tissue around the implant (capsular contracture).  We discussed patient's personal risk factors for postoperative complications including her history of radiation to the right breast.  We also discussed options for implants.  She reports that she is unsure if she would like silicone or saline.  She currently has saline in both breasts.  She reports that she is interested in having bilateral breast implants exchanged.  She reports that if we were to be able to exchange bilateral implants she would like to go with silicone implants.  If we are unable to exchange bilateral implants she would like to have a saline implant placed in the right to match the left side.  I discussed with the patient that I would discuss this further with our surgical scheduling team and Dr. Claudia Desanctis.  Pictures were obtained of the patient and placed in the chart with the patient's or guardian's  permission.   Addendum: I called the patient today to confirm that we are also exchanging left breast implant on 11/14/2020.  She confirmed this.  I have confirmed this with the surgical scheduling team in the surgical scheduler will confirm this with the operating room.  Electronically signed by: Carola Rhine Euva Rundell, PA-C 10/11/2020 9:23 AM

## 2020-10-18 ENCOUNTER — Encounter (HOSPITAL_BASED_OUTPATIENT_CLINIC_OR_DEPARTMENT_OTHER): Payer: Self-pay | Admitting: Plastic Surgery

## 2020-10-18 ENCOUNTER — Other Ambulatory Visit: Payer: Self-pay

## 2020-10-21 ENCOUNTER — Other Ambulatory Visit
Admission: RE | Admit: 2020-10-21 | Discharge: 2020-10-21 | Disposition: A | Discharge status: Home / Self Care | Payer: 59 | Source: Ambulatory Visit | Attending: Plastic Surgery | Admitting: Plastic Surgery

## 2020-10-21 ENCOUNTER — Other Ambulatory Visit: Payer: Self-pay

## 2020-10-21 ENCOUNTER — Encounter (HOSPITAL_BASED_OUTPATIENT_CLINIC_OR_DEPARTMENT_OTHER)
Admission: RE | Admit: 2020-10-21 | Discharge: 2020-10-21 | Disposition: A | Discharge status: Home / Self Care | Payer: 59 | Source: Ambulatory Visit | Attending: Plastic Surgery | Admitting: Plastic Surgery

## 2020-10-21 DIAGNOSIS — Z20822 Contact with and (suspected) exposure to covid-19: Secondary | ICD-10-CM | POA: Diagnosis not present

## 2020-10-21 DIAGNOSIS — Z01812 Encounter for preprocedural laboratory examination: Secondary | ICD-10-CM | POA: Insufficient documentation

## 2020-10-21 LAB — BASIC METABOLIC PANEL
Anion gap: 11 (ref 5–15)
BUN: 12 mg/dL (ref 6–20)
CO2: 24 mmol/L (ref 22–32)
Calcium: 9.8 mg/dL (ref 8.9–10.3)
Chloride: 102 mmol/L (ref 98–111)
Creatinine, Ser: 0.77 mg/dL (ref 0.44–1.00)
GFR, Estimated: >60 mL/min (ref >60)
Glucose, Bld: 91 mg/dL (ref 70–99)
Potassium: 4.4 mmol/L (ref 3.5–5.1)
Sodium: 137 mmol/L (ref 135–145)

## 2020-10-21 LAB — SARS CORONAVIRUS 2 (TAT 6-24 HRS): SARS Coronavirus 2: NEGATIVE

## 2020-10-28 ENCOUNTER — Other Ambulatory Visit: Payer: Self-pay

## 2020-10-28 ENCOUNTER — Encounter (HOSPITAL_BASED_OUTPATIENT_CLINIC_OR_DEPARTMENT_OTHER): Payer: Self-pay | Admitting: Plastic Surgery

## 2020-11-01 ENCOUNTER — Telehealth: Payer: Self-pay | Admitting: Hematology and Oncology

## 2020-11-01 NOTE — Telephone Encounter (Signed)
11/01/2020 Returned pts call regarding appts on 11/03/20; pt tested positive for COVID and needs to r/s. New appt made for 11/22/20 @ 1 pm, pt confirmed this change SRW

## 2020-11-02 ENCOUNTER — Encounter: Payer: 59 | Admitting: Plastic Surgery

## 2020-11-02 ENCOUNTER — Other Ambulatory Visit (HOSPITAL_COMMUNITY): Payer: 59

## 2020-11-02 ENCOUNTER — Other Ambulatory Visit: Payer: 59

## 2020-11-02 ENCOUNTER — Other Ambulatory Visit: Payer: Self-pay

## 2020-11-02 MED ORDER — AZITHROMYCIN 250 MG PO TABS: ORAL_TABLET | ORAL | 0 refills | Status: DC

## 2020-11-03 ENCOUNTER — Inpatient Hospital Stay: Payer: 59 | Admitting: Hematology and Oncology

## 2020-11-03 ENCOUNTER — Inpatient Hospital Stay: Payer: 59

## 2020-11-08 ENCOUNTER — Other Ambulatory Visit: Payer: Self-pay

## 2020-11-08 ENCOUNTER — Encounter (HOSPITAL_BASED_OUTPATIENT_CLINIC_OR_DEPARTMENT_OTHER): Payer: Self-pay | Admitting: Plastic Surgery

## 2020-11-09 ENCOUNTER — Encounter (HOSPITAL_BASED_OUTPATIENT_CLINIC_OR_DEPARTMENT_OTHER)
Admission: RE | Admit: 2020-11-09 | Discharge: 2020-11-09 | Disposition: A | Discharge status: Home / Self Care | Payer: 59 | Source: Ambulatory Visit | Attending: Plastic Surgery | Admitting: Plastic Surgery

## 2020-11-09 DIAGNOSIS — Z01818 Encounter for other preprocedural examination: Secondary | ICD-10-CM | POA: Insufficient documentation

## 2020-11-09 LAB — BASIC METABOLIC PANEL
Anion gap: 9 (ref 5–15)
BUN: 10 mg/dL (ref 6–20)
CO2: 26 mmol/L (ref 22–32)
Calcium: 9.7 mg/dL (ref 8.9–10.3)
Chloride: 103 mmol/L (ref 98–111)
Creatinine, Ser: 0.71 mg/dL (ref 0.44–1.00)
GFR, Estimated: >60 mL/min (ref >60)
Glucose, Bld: 97 mg/dL (ref 70–99)
Potassium: 4.4 mmol/L (ref 3.5–5.1)
Sodium: 138 mmol/L (ref 135–145)

## 2020-11-09 NOTE — Progress Notes (Signed)

## 2020-11-10 ENCOUNTER — Other Ambulatory Visit (HOSPITAL_COMMUNITY): Payer: 59

## 2020-11-11 ENCOUNTER — Other Ambulatory Visit (HOSPITAL_COMMUNITY): Payer: 59

## 2020-11-14 ENCOUNTER — Encounter (HOSPITAL_BASED_OUTPATIENT_CLINIC_OR_DEPARTMENT_OTHER)
Admission: RE | Disposition: A | Discharge status: Home / Self Care | Payer: Self-pay | Source: Home / Self Care | Attending: Plastic Surgery

## 2020-11-14 ENCOUNTER — Encounter (HOSPITAL_BASED_OUTPATIENT_CLINIC_OR_DEPARTMENT_OTHER): Payer: Self-pay | Admitting: Plastic Surgery

## 2020-11-14 ENCOUNTER — Other Ambulatory Visit: Payer: Self-pay

## 2020-11-14 ENCOUNTER — Ambulatory Visit (HOSPITAL_BASED_OUTPATIENT_CLINIC_OR_DEPARTMENT_OTHER): Payer: 59 | Admitting: Anesthesiology

## 2020-11-14 ENCOUNTER — Ambulatory Visit (HOSPITAL_BASED_OUTPATIENT_CLINIC_OR_DEPARTMENT_OTHER)
Admission: RE | Admit: 2020-11-14 | Discharge: 2020-11-14 | Disposition: A | Discharge status: Home / Self Care | Payer: 59 | Attending: Plastic Surgery | Admitting: Plastic Surgery

## 2020-11-14 DIAGNOSIS — Z881 Allergy status to other antibiotic agents status: Secondary | ICD-10-CM | POA: Insufficient documentation

## 2020-11-14 DIAGNOSIS — Z421 Encounter for breast reconstruction following mastectomy: Secondary | ICD-10-CM | POA: Diagnosis not present

## 2020-11-14 DIAGNOSIS — Y831 Surgical operation with implant of artificial internal device as the cause of abnormal reaction of the patient, or of later complication, without mention of misadventure at the time of the procedure: Secondary | ICD-10-CM | POA: Insufficient documentation

## 2020-11-14 DIAGNOSIS — N651 Disproportion of reconstructed breast: Secondary | ICD-10-CM | POA: Insufficient documentation

## 2020-11-14 DIAGNOSIS — Z87891 Personal history of nicotine dependence: Secondary | ICD-10-CM | POA: Diagnosis not present

## 2020-11-14 DIAGNOSIS — E876 Hypokalemia: Secondary | ICD-10-CM | POA: Diagnosis not present

## 2020-11-14 DIAGNOSIS — T8542XA Displacement of breast prosthesis and implant, initial encounter: Secondary | ICD-10-CM | POA: Insufficient documentation

## 2020-11-14 DIAGNOSIS — F418 Other specified anxiety disorders: Secondary | ICD-10-CM | POA: Diagnosis not present

## 2020-11-14 DIAGNOSIS — Z853 Personal history of malignant neoplasm of breast: Secondary | ICD-10-CM | POA: Insufficient documentation

## 2020-11-14 HISTORY — PX: BREAST RECONSTRUCTION WITH PLACEMENT OF TISSUE EXPANDER AND FLEX HD (ACELLULAR HYDRATED DERMIS): SHX6295

## 2020-11-14 HISTORY — DX: Other specified postprocedural states: Z98.890

## 2020-11-14 HISTORY — DX: Other specified postprocedural states: R11.2

## 2020-11-14 SURGERY — BREAST RECONSTRUCTION WITH PLACEMENT OF TISSUE EXPANDER AND FLEX HD (ACELLULAR HYDRATED DERMIS)
Anesthesia: General | Site: Breast | Laterality: Right

## 2020-11-14 MED ORDER — ACETAMINOPHEN 500 MG PO TABS
ORAL_TABLET | ORAL | Status: AC
  Filled 2020-11-14: qty 2

## 2020-11-14 MED ORDER — FENTANYL CITRATE (PF) 100 MCG/2ML IJ SOLN
INTRAMUSCULAR | Status: AC
  Filled 2020-11-14: qty 2

## 2020-11-14 MED ORDER — OXYCODONE HCL 5 MG PO TABS: 5.0000 mg | ORAL_TABLET | Freq: Once | ORAL | Status: DC | PRN

## 2020-11-14 MED ORDER — LIDOCAINE 2% (20 MG/ML) 5 ML SYRINGE
INTRAMUSCULAR | Status: DC | PRN
  Administered 2020-11-14: 60 mg via INTRAVENOUS

## 2020-11-14 MED ORDER — SCOPOLAMINE 1 MG/3DAYS TD PT72
1.0000 | MEDICATED_PATCH | TRANSDERMAL | Status: DC
  Administered 2020-11-14: 1.5 mg via TRANSDERMAL

## 2020-11-14 MED ORDER — PHENYLEPHRINE 40 MCG/ML (10ML) SYRINGE FOR IV PUSH (FOR BLOOD PRESSURE SUPPORT)
PREFILLED_SYRINGE | INTRAVENOUS | Status: AC
  Filled 2020-11-14: qty 10

## 2020-11-14 MED ORDER — FENTANYL CITRATE (PF) 100 MCG/2ML IJ SOLN: 25.0000 ug | INTRAMUSCULAR | Status: DC | PRN

## 2020-11-14 MED ORDER — PROMETHAZINE HCL 25 MG/ML IJ SOLN: 6.2500 mg | INTRAMUSCULAR | Status: DC | PRN

## 2020-11-14 MED ORDER — MIDAZOLAM HCL 5 MG/5ML IJ SOLN
INTRAMUSCULAR | Status: DC | PRN
  Administered 2020-11-14: 2 mg via INTRAVENOUS

## 2020-11-14 MED ORDER — MIDAZOLAM HCL 2 MG/2ML IJ SOLN
INTRAMUSCULAR | Status: AC
  Filled 2020-11-14: qty 2

## 2020-11-14 MED ORDER — LACTATED RINGERS IV SOLN: INTRAVENOUS | Status: DC

## 2020-11-14 MED ORDER — PROPOFOL 500 MG/50ML IV EMUL
INTRAVENOUS | Status: DC | PRN
  Administered 2020-11-14: 25 ug/kg/min via INTRAVENOUS

## 2020-11-14 MED ORDER — ACETAMINOPHEN 500 MG PO TABS
1000.0000 mg | ORAL_TABLET | Freq: Once | ORAL | Status: AC
  Administered 2020-11-14: 1000 mg via ORAL

## 2020-11-14 MED ORDER — SODIUM CHLORIDE 0.9 % IV SOLN
INTRAVENOUS | Status: DC | PRN
  Administered 2020-11-14: 500 mL

## 2020-11-14 MED ORDER — AMISULPRIDE (ANTIEMETIC) 5 MG/2ML IV SOLN: 10.0000 mg | Freq: Once | INTRAVENOUS | Status: DC | PRN

## 2020-11-14 MED ORDER — PROPOFOL 10 MG/ML IV BOLUS
INTRAVENOUS | Status: DC | PRN
  Administered 2020-11-14: 150 mg via INTRAVENOUS

## 2020-11-14 MED ORDER — SODIUM CHLORIDE 0.9 % IV SOLN
INTRAVENOUS | Status: AC
  Filled 2020-11-14: qty 10

## 2020-11-14 MED ORDER — CLINDAMYCIN PHOSPHATE 900 MG/50ML IV SOLN
INTRAVENOUS | Status: AC
  Filled 2020-11-14: qty 50

## 2020-11-14 MED ORDER — ONDANSETRON HCL 4 MG/2ML IJ SOLN
INTRAMUSCULAR | Status: DC | PRN
  Administered 2020-11-14: 4 mg via INTRAVENOUS

## 2020-11-14 MED ORDER — BUPIVACAINE-EPINEPHRINE 0.25% -1:200000 IJ SOLN
INTRAMUSCULAR | Status: DC | PRN
  Administered 2020-11-14: 20 mL

## 2020-11-14 MED ORDER — DEXAMETHASONE SODIUM PHOSPHATE 4 MG/ML IJ SOLN
INTRAMUSCULAR | Status: DC | PRN
  Administered 2020-11-14: 10 mg via INTRAVENOUS

## 2020-11-14 MED ORDER — 0.9 % SODIUM CHLORIDE (POUR BTL) OPTIME
TOPICAL | Status: DC | PRN
  Administered 2020-11-14: 1000 mL

## 2020-11-14 MED ORDER — FENTANYL CITRATE (PF) 100 MCG/2ML IJ SOLN
INTRAMUSCULAR | Status: DC | PRN
  Administered 2020-11-14 (×2): 50 ug via INTRAVENOUS

## 2020-11-14 MED ORDER — OXYCODONE HCL 5 MG/5ML PO SOLN: 5.0000 mg | Freq: Once | ORAL | Status: DC | PRN

## 2020-11-14 MED ORDER — SCOPOLAMINE 1 MG/3DAYS TD PT72
MEDICATED_PATCH | TRANSDERMAL | Status: AC
  Filled 2020-11-14: qty 1

## 2020-11-14 MED ORDER — CLINDAMYCIN PHOSPHATE 900 MG/50ML IV SOLN
900.0000 mg | INTRAVENOUS | Status: AC
  Administered 2020-11-14: 900 mg via INTRAVENOUS

## 2020-11-14 SURGICAL SUPPLY — 76 items
APL PRP STRL LF DISP 70% ISPRP (MISCELLANEOUS) ×4
BAG DECANTER FOR FLEXI CONT (MISCELLANEOUS) ×3 IMPLANT
BIOPATCH RED 1 DISK 7.0 (GAUZE/BANDAGES/DRESSINGS) IMPLANT
BLADE SURG 10 STRL SS (BLADE) ×6 IMPLANT
BLADE SURG 15 STRL LF DISP TIS (BLADE) ×2 IMPLANT
BLADE SURG 15 STRL SS (BLADE) ×3
BNDG ELASTIC 6X5.8 VLCR STR LF (GAUZE/BANDAGES/DRESSINGS) ×6 IMPLANT
BNDG GAUZE ELAST 4 BULKY (GAUZE/BANDAGES/DRESSINGS) ×6 IMPLANT
CANISTER SUCT 1200ML W/VALVE (MISCELLANEOUS) IMPLANT
CHLORAPREP W/TINT 26 (MISCELLANEOUS) ×6 IMPLANT
COVER BACK TABLE 60X90IN (DRAPES) ×3 IMPLANT
COVER MAYO STAND STRL (DRAPES) ×3 IMPLANT
COVER WAND RF STERILE (DRAPES) IMPLANT
DECANTER SPIKE VIAL GLASS SM (MISCELLANEOUS) IMPLANT
DRAIN CHANNEL 15F RND FF W/TCR (WOUND CARE) IMPLANT
DRAPE TOP ARMCOVERS (MISCELLANEOUS) ×6 IMPLANT
DRAPE UTILITY XL STRL (DRAPES) ×3 IMPLANT
DRESSING PRVNA BELLAFORM 21X19 (GAUZE/BANDAGES/DRESSINGS) IMPLANT
DRSG PAD ABDOMINAL 8X10 ST (GAUZE/BANDAGES/DRESSINGS) ×12 IMPLANT
DRSG PREVENA BELLAFORM 21X19 (GAUZE/BANDAGES/DRESSINGS)
DRSG TEGADERM 4X10 (GAUZE/BANDAGES/DRESSINGS) IMPLANT
DRSG TEGADERM 4X4.75 (GAUZE/BANDAGES/DRESSINGS) IMPLANT
ELECT BLADE 4.0 EZ CLEAN MEGAD (MISCELLANEOUS)
ELECT COATED BLADE 2.86 ST (ELECTRODE) IMPLANT
ELECT REM PT RETURN 9FT ADLT (ELECTROSURGICAL) ×3
ELECTRODE BLDE 4.0 EZ CLN MEGD (MISCELLANEOUS) IMPLANT
ELECTRODE REM PT RTRN 9FT ADLT (ELECTROSURGICAL) ×2 IMPLANT
EVACUATOR SILICONE 100CC (DRAIN) IMPLANT
FUNNEL KELLER 2 DISP (MISCELLANEOUS) ×3 IMPLANT
GLOVE BIO SURGEON STRL SZ7.5 (GLOVE) ×6 IMPLANT
GLOVE SRG 8 PF TXTR STRL LF DI (GLOVE) ×2 IMPLANT
GLOVE SURG ENC MOIS LTX SZ7 (GLOVE) IMPLANT
GLOVE SURG ENC TEXT LTX SZ7.5 (GLOVE) ×3 IMPLANT
GLOVE SURG UNDER POLY LF SZ8 (GLOVE) ×3
GOWN STRL REUS W/ TWL LRG LVL3 (GOWN DISPOSABLE) ×6 IMPLANT
GOWN STRL REUS W/TWL LRG LVL3 (GOWN DISPOSABLE) ×9
IMPL BREAST MP 215CC (Breast) ×2 IMPLANT
IMPL BREAST MP 240CC (Breast) ×2 IMPLANT
IMPLANT BREAST MP 215CC (Breast) ×3 IMPLANT
IMPLANT BREAST MP 240CC (Breast) ×3 IMPLANT
IV NS 500ML (IV SOLUTION)
IV NS 500ML BAXH (IV SOLUTION) IMPLANT
KIT FILL SYSTEM UNIVERSAL (SET/KITS/TRAYS/PACK) IMPLANT
MARKER SKIN DUAL TIP RULER LAB (MISCELLANEOUS) ×3 IMPLANT
NEEDLE HYPO 25X1 1.5 SAFETY (NEEDLE) ×3 IMPLANT
PACK BASIN DAY SURGERY FS (CUSTOM PROCEDURE TRAY) ×3 IMPLANT
PENCIL SMOKE EVACUATOR (MISCELLANEOUS) ×3 IMPLANT
PIN SAFETY STERILE (MISCELLANEOUS) IMPLANT
RETRACTOR ONETRAX LX 135X30 (MISCELLANEOUS) ×3 IMPLANT
SHEET MEDIUM DRAPE 40X70 STRL (DRAPES) IMPLANT
SIZER BREAST REUSABLE 215CC (SIZER) ×3
SIZER BREAST REUSABLE 240CC (SIZER) ×6
SIZER BRST REUSABLE 215CC (SIZER) ×2 IMPLANT
SIZER BRST REUSABLE 240CC (SIZER) ×4 IMPLANT
SLEEVE SCD COMPRESS KNEE MED (MISCELLANEOUS) ×3 IMPLANT
SPONGE LAP 18X18 RF (DISPOSABLE) ×6 IMPLANT
STAPLER VISISTAT 35W (STAPLE) ×3 IMPLANT
STRIP CLOSURE SKIN 1/2X4 (GAUZE/BANDAGES/DRESSINGS) ×12 IMPLANT
SUT ETHILON 2 0 FS 18 (SUTURE) IMPLANT
SUT MON AB 3-0 SH 27 (SUTURE)
SUT MON AB 3-0 SH27 (SUTURE) IMPLANT
SUT MON AB 4-0 PC3 18 (SUTURE) ×3 IMPLANT
SUT PDS 3-0 CT2 (SUTURE) ×9
SUT PDS II 3-0 CT2 27 ABS (SUTURE) ×6 IMPLANT
SUT PLAIN 5 0 P 3 18 (SUTURE) IMPLANT
SUT VLOC 180 0 24IN GS25 (SUTURE) ×3 IMPLANT
SUT VLOC 90 P-14 23 (SUTURE) ×3 IMPLANT
SYR BULB IRRIG 60ML STRL (SYRINGE) ×3 IMPLANT
SYR CONTROL 10ML LL (SYRINGE) ×3 IMPLANT
TAPE MEASURE VINYL STERILE (MISCELLANEOUS) IMPLANT
TISSUE GRAFT FLEX HD 4X12 (Tissue) ×3 IMPLANT
TOWEL GREEN STERILE FF (TOWEL DISPOSABLE) ×6 IMPLANT
TRAY FOLEY W/BAG SLVR 14FR LF (SET/KITS/TRAYS/PACK) IMPLANT
TUBE CONNECTING 20X1/4 (TUBING) ×3 IMPLANT
UNDERPAD 30X36 HEAVY ABSORB (UNDERPADS AND DIAPERS) ×6 IMPLANT
YANKAUER SUCT BULB TIP NO VENT (SUCTIONS) ×3 IMPLANT

## 2020-11-14 NOTE — Discharge Instructions (Addendum)
Activity As tolerated: NO showers for 3 days. Keep ACE wrap on breasts until then. After showering, put ACE wrap back on, this is important for compression. NO driving while in pain, taking pain medication or if you are unable to safely react to traffic. No heavy activities Take Pain medication (Norco) as needed for severe pain. Otherwise, you can use ibuprofen or tylenol as needed. Avoid more than 3,000 mg of tylenol in 24 hours. Norco has 325mg  of tylenol per dose.  Diet: Regular. Drink plenty of fluids and eat healthy, high protein, low carbs.  Wound Care: Keep dressing clean & dry. You may change bandages after showering if you continue to notice some drainage. You can reuse bandages if they are not dirty/soiled.  Special Instructions: Call Doctor if any unusual problems occur such as pain, excessive Bleeding, unrelieved Nausea/vomiting, Fever &/or chills  Follow-up appointment: Scheduled for next week.   1000 mg of Tylenol given at 11:20 am on 11/14/20   Post Anesthesia Home Care Instructions  Activity: Get plenty of rest for the remainder of the day. A responsible individual must stay with you for 24 hours following the procedure.  For the next 24 hours, DO NOT: -Drive a car -Paediatric nurse -Drink alcoholic beverages -Take any medication unless instructed by your physician -Make any legal decisions or sign important papers.  Meals: Start with liquid foods such as gelatin or soup. Progress to regular foods as tolerated. Avoid greasy, spicy, heavy foods. If nausea and/or vomiting occur, drink only clear liquids until the nausea and/or vomiting subsides. Call your physician if vomiting continues.  Special Instructions/Symptoms: Your throat may feel dry or sore from the anesthesia or the breathing tube placed in your throat during surgery. If this causes discomfort, gargle with warm salt water. The discomfort should disappear within 24 hours.  If you had a scopolamine patch  placed behind your ear for the management of post- operative nausea and/or vomiting:  1. The medication in the patch is effective for 72 hours, after which it should be removed.  Wrap patch in a tissue and discard in the trash. Wash hands thoroughly with soap and water. 2. You may remove the patch earlier than 72 hours if you experience unpleasant side effects which may include dry mouth, dizziness or visual disturbances. 3. Avoid touching the patch. Wash your hands with soap and water after contact with the patch.

## 2020-11-14 NOTE — Interval H&P Note (Signed)
History and Physical Interval Note:  11/14/2020 12:27 PM  Emma Atkinson  has presented today for surgery, with the diagnosis of HISTORY OF BREAST CANCER.  The various methods of treatment have been discussed with the patient and family. After consideration of risks, benefits and other options for treatment, the patient has consented to  Procedure(s): REVISION OF RIGHT BREAST RECONSTRUCTION, RIGHT BREAST LATERAL CAPSULORRHAPHY, PLACEMENT OF FLEX HD TO RIGHT BREAST, EXCHANGE OF RIGHT BREAST IMPLANT. (Right) EXCHANGE OF LEFT BREAST IMPLANT (Left) as a surgical intervention.  The patient's history has been reviewed, patient examined, no change in status, stable for surgery.  I have reviewed the patient's chart and labs.  Questions were answered to the patient's satisfaction.     Cindra Presume

## 2020-11-14 NOTE — Anesthesia Preprocedure Evaluation (Addendum)
Anesthesia Evaluation  Patient identified by MRN, date of birth, ID band Patient awake    Reviewed: Allergy & Precautions, NPO status , Patient's Chart, lab work & pertinent test results  History of Anesthesia Complications (+) PONV and history of anesthetic complications  Airway Mallampati: II  TM Distance: >3 FB Neck ROM: Full    Dental no notable dental hx.    Pulmonary former smoker,    Pulmonary exam normal breath sounds clear to auscultation       Cardiovascular Exercise Tolerance: Good hypertension, Normal cardiovascular exam Rhythm:Regular Rate:Normal  Normal sinus rhythm Minimal voltage criteria for LVH, may be normal variant ( Sokolow-Lyon ) Borderline ECG When compared with ECG of 08-Dec-2019 10:07, No significant change was found Confirmed by Bartholome Bill [id number]) on 01/25/2020 12:42:16 PM   Neuro/Psych PSYCHIATRIC DISORDERS Anxiety Depression    GI/Hepatic Neg liver ROS, GERD  ,  Endo/Other  negative endocrine ROS  Renal/GU negative Renal ROS     Musculoskeletal negative musculoskeletal ROS (+)   Abdominal   Peds  Hematology negative hematology ROS (+)   Anesthesia Other Findings H/o breast cancer  Reproductive/Obstetrics negative OB ROS                            Anesthesia Physical Anesthesia Plan  ASA: II  Anesthesia Plan: General   Post-op Pain Management:    Induction: Intravenous  PONV Risk Score and Plan: 4 or greater and Scopolamine patch - Pre-op, Midazolam, Dexamethasone and Ondansetron  Airway Management Planned: LMA  Additional Equipment:   Intra-op Plan:   Post-operative Plan: Extubation in OR  Informed Consent: I have reviewed the patients History and Physical, chart, labs and discussed the procedure including the risks, benefits and alternatives for the proposed anesthesia with the patient or authorized representative who has indicated his/her  understanding and acceptance.     Dental advisory given  Plan Discussed with: CRNA and Anesthesiologist  Anesthesia Plan Comments:       Anesthesia Quick Evaluation

## 2020-11-14 NOTE — Brief Op Note (Signed)
11/14/2020  2:12 PM  PATIENT:  Emma Atkinson  55 y.o. female  PRE-OPERATIVE DIAGNOSIS:  HISTORY OF BREAST CANCER  POST-OPERATIVE DIAGNOSIS:  HISTORY OF BREAST CANCER  PROCEDURE:  Procedure(s): REVISION OF BILATERAL BREAST RECONSTRUCTION, RIGHT CAPSULORRHAPHY, PLACEMENT OF FLEX HD TO RIGHT BREAST, EXCHANGE OF BILATERAL IMPLANTS. (Right)  SURGEON:  Surgeon(s) and Role:    * Marqueze Ramcharan, Steffanie Dunn, MD - Primary  PHYSICIAN ASSISTANT: Software engineer, PA   ASSISTANTS: none   ANESTHESIA:   general  EBL:  20   BLOOD ADMINISTERED:none  DRAINS: none   LOCAL MEDICATIONS USED:  MARCAINE     SPECIMEN:  No Specimen  DISPOSITION OF SPECIMEN:  PATHOLOGY  COUNTS:  YES  TOURNIQUET:  * No tourniquets in log *  DICTATION: .Dragon Dictation  PLAN OF CARE: Discharge to home after PACU  PATIENT DISPOSITION:  PACU - hemodynamically stable.   Delay start of Pharmacological VTE agent (>24hrs) due to surgical blood loss or risk of bleeding: not applicable

## 2020-11-14 NOTE — H&P (Signed)
  Reason for Consult/CC:Breast asymetry  Emma Atkinson is an 55 y.o. female.  HPI: Pt has asymetry and right implant migrated laterally.  Here for breast recon revision and replacement of implant on left.  Allergies:  Allergies  Allergen Reactions  . Ceftin [Cefuroxime Axetil] Rash    Medications:  Current Facility-Administered Medications:  .  clindamycin (CLEOCIN) IVPB 900 mg, 900 mg, Intravenous, On Call to OR, Cindra Presume, MD .  lactated ringers infusion, , Intravenous, Continuous, Houser, Ishmael Holter, MD .  lactated ringers infusion, , Intravenous, Continuous, Roderic Palau, MD, New Bag at 11/14/20 1223 .  scopolamine (TRANSDERM-SCOP) 1 MG/3DAYS 1.5 mg, 1 patch, Transdermal, Q72H, Merlinda Frederick, MD, 1.5 mg at 11/14/20 1120  Past Medical History:  Diagnosis Date  . Allergy   . Anemia    H/O  . Anxiety   . Blood transfusion without reported diagnosis   . Breast cancer ([facility])    breast  . Chronic kidney disease 12-2015   PYLONEPHRITIS  . Complication of anesthesia    HARD TO WAKE UP AFTER TONSILLECTOMY AS A CHILD BUT NO PROBLEMS SINCE  . Complication of anesthesia    PT STATES THAT BACK IN OR DURING PORT PLACMENT IN 2016 THAT SHE BECAME VEERY FLUSHED, LIGHT HEADED AND CRNA SAID ANTIBIOTIC MAY BE GOING IN IN TO FAST-RATE DECREASED AND PTS SYMPTOMS RESOLVED IMMEDIATELY  . Depression   . GERD (gastroesophageal reflux disease)   . Hypertension   . PONV (postoperative nausea and vomiting)   . Shortness of breath dyspnea     Past Surgical History:  Procedure Laterality Date  . ABDOMINAL HYSTERECTOMY  2001  . BACK SURGERY     FOR SCOLIOSIS-HERRINGTON RODS IN PLACE  . BREAST BIOPSY Bilateral    08/17/2015   . BREAST RECONSTRUCTION Bilateral   . PORTACATH PLACEMENT N/A 08/30/2015   Procedure: INSERTION PORT-A-CATH;  Surgeon: Leonie Green, MD;  Location: [facility] ORS;  Service: General;  Laterality: N/A;  . SENTINEL NODE BIOPSY Bilateral 03/08/2016   Procedure:  SENTINEL NODE BIOPSY;  Surgeon: Leonie Green, MD;  Location: [facility] ORS;  Service: General;  Laterality: Bilateral;  . SIMPLE MASTECTOMY WITH AXILLARY SENTINEL NODE BIOPSY Bilateral 03/08/2016   Procedure: SIMPLE MASTECTOMY;  Surgeon: Leonie Green, MD;  Location: [facility] ORS;  Service: General;  Laterality: Bilateral;  . TONSILLECTOMY      Family History  Problem Relation Age of Onset  . Breast cancer Maternal Aunt 64  . Cancer Father   . Cancer Sister   . Cancer Maternal Grandfather   . Colon cancer Neg Hx   . Esophageal cancer Neg Hx   . Rectal cancer Neg Hx   . Stomach cancer Neg Hx     Social History:  reports that she quit smoking about 21 years ago. Her smoking use included cigarettes. She has a 10.00 pack-year smoking history. She has never used smokeless tobacco. She reports current alcohol use. She reports that she does not use drugs.  Physical Exam Blood pressure 138/75, pulse 70, temperature 98.4 F (36.9 C), temperature source Oral, resp. rate 18, height 5' (1.524 m), weight 55.2 kg, SpO2 100 %. General: NAD AOx3 RRR CTAB  No results found for this or any previous visit (from the past 48 hour(s)).  No results found.  Assessment/Plan: Proceed w surgery today  Cindra Presume 11/14/2020, 12:26 PM

## 2020-11-14 NOTE — Anesthesia Procedure Notes (Signed)
Procedure Name: LMA Insertion Date/Time: 11/14/2020 12:50 PM Performed by: Signe Colt, CRNA Pre-anesthesia Checklist: Patient identified, Emergency Drugs available, Suction available and Patient being monitored Patient Re-evaluated:Patient Re-evaluated prior to induction Oxygen Delivery Method: Circle System Utilized Preoxygenation: Pre-oxygenation with 100% oxygen Induction Type: IV induction Ventilation: Mask ventilation without difficulty LMA: LMA inserted LMA Size: 4.0 Number of attempts: 1 Airway Equipment and Method: bite block Placement Confirmation: positive ETCO2 Tube secured with: Tape Dental Injury: Teeth and Oropharynx as per pre-operative assessment

## 2020-11-14 NOTE — Op Note (Signed)
Operative Note   DATE OF OPERATION: 11/14/2020  SURGICAL DEPARTMENT: Plastic Surgery  PREOPERATIVE DIAGNOSES: Breast asymmetry after bilateral breast reconstruction  POSTOPERATIVE DIAGNOSES:  same  PROCEDURE: 1.  Revision of right breast reconstruction with lateral capsulorrhaphy, limited lateral capsulectomy and medial capsulotomy 2.  Reinforcement of right breast capsulorrhaphy with acellular dermal matrix 3.  Removal of bilateral breast implants 4.  Replacement of bilateral breast implants  SURGEON: Talmadge Coventry, MD  ASSISTANT: Verdie Shire, PA The advanced practice practitioner (APP) assisted throughout the case.  The APP was essential in retraction and counter traction when needed to make the case progress smoothly.  This retraction and assistance made it possible to see the tissue plans for the procedure.  The assistance was needed for blood control, tissue re-approximation and assisted with closure of the incision site.  ANESTHESIA:  General.   COMPLICATIONS: None.   INDICATIONS FOR PROCEDURE:  The patient, Emma Atkinson is a 55 y.o. female born on 1966/05/27, is here for treatment of breast asymmetry after bilateral breast reconstruction.  She had radiation on the right side with a latissimus flap.  She had tissue expanders that were ultimately switched to silicone implants.  Implant has migrated laterally to her underarm and she would like it to be medialized.  Additionally she would like the implants to be switched out to improve the symmetry. MRN: [id number]  CONSENT:  Informed consent was obtained directly from the patient. Risks, benefits and alternatives were fully discussed. Specific risks including but not limited to bleeding, infection, hematoma, seroma, scarring, pain, contracture, asymmetry, wound healing problems, and need for further surgery were all discussed. The patient did have an ample opportunity to have questions answered to satisfaction.   DESCRIPTION  OF PROCEDURE:  The patient was taken to the operating room. SCDs were placed and antibiotics were given.  General anesthesia was administered.  The patient's operative site was prepped and draped in a sterile fashion. A time out was performed and all information was confirmed to be correct.  I started by incising along the scar from her right latissimus flap with a 10 blade.  I dissected down to the capsule and through the capsule with cautery.  A 255 cc gel implant was removed.  I then did a limited lateral capsulectomy which was then plicated with a 0 V-Loc.  A medial capsulotomy was then performed to extend the pocket in that direction.  I then brought a 4 x 12 cm piece of Flex HD onto the field.  It was soaked in saline.  It was then cut to size and sutured along the lateral capsulorrhaphy to reinforce it.  This was done with 3-0 PDS.  A 215 cc gel sizer was then placed as the pocket was slightly smaller than before due to the capsulorrhaphy.  This gave and improved on table result.  A incision was then made along the scar along her left breast.  I dissected down to the implant and removed a 210 cc gel implant.  I replaced this with a 240 cc gel sizer and stapled the skin closed.  Patient was then set up to check for size and symmetry which were quite good.  The right implant was medialized and the overall symmetry was improved from preop.  She was inset back down.  The sizer was removed and both pockets were irrigated with triple antibiotic solution.  The implant chosen for the left side was a 240 cc Mentor memory gel smooth moderate plus extra implant.  The serial number is [id number].  The right implant was a Mentor memory gel smooth round moderate plus extra implant with 215 cc of volume.  The serial number is [id number].  These were both placed with a Keller funnel.  Skin was closed with thereafter buried 3-0 PDS sutures in a running 3 oh VueLock.  Steri-Strips were then applied along with a soft  compressive wrap.  The patient tolerated the procedure well.  There were no complications. The patient was allowed to wake from anesthesia, extubated and taken to the recovery room in satisfactory condition.

## 2020-11-14 NOTE — Transfer of Care (Signed)
Immediate Anesthesia Transfer of Care Note  Patient: Emma Atkinson  Procedure(s) Performed: REVISION OF BILATERAL BREAST RECONSTRUCTION, RIGHT CAPSULORRHAPHY, PLACEMENT OF FLEX HD TO RIGHT BREAST, EXCHANGE OF BILATERAL IMPLANTS. (Right Breast)  Patient Location: PACU  Anesthesia Type:General  Level of Consciousness: drowsy and patient cooperative  Airway & Oxygen Therapy: Patient Spontanous Breathing and Patient connected to face mask oxygen  Post-op Assessment: Report given to RN and Post -op Vital signs reviewed and stable  Post vital signs: Reviewed and stable  Last Vitals:  Vitals Value Taken Time  BP    Temp    Pulse 73 11/14/20 1416  Resp 11 11/14/20 1416  SpO2 100 % 11/14/20 1416  Vitals shown include unvalidated device data.  Last Pain:  Vitals:   11/14/20 1114  TempSrc: Oral  PainSc: 0-No pain         Complications: No complications documented.

## 2020-11-14 NOTE — Anesthesia Postprocedure Evaluation (Signed)
Anesthesia Post Note  Patient: Gerhard Munch  Procedure(s) Performed: REVISION OF BILATERAL BREAST RECONSTRUCTION, RIGHT CAPSULORRHAPHY, PLACEMENT OF FLEX HD TO RIGHT BREAST, EXCHANGE OF BILATERAL IMPLANTS. (Right Breast)     Patient location during evaluation: PACU Anesthesia Type: General Level of consciousness: awake Pain management: pain level controlled Vital Signs Assessment: post-procedure vital signs reviewed and stable Respiratory status: spontaneous breathing and respiratory function stable Cardiovascular status: stable Postop Assessment: no apparent nausea or vomiting Anesthetic complications: no   No complications documented.  Last Vitals:  Vitals:   11/14/20 1445 11/14/20 1500  BP: 124/79   Pulse: 73 73  Resp: 15 15  Temp:    SpO2: 99% 95%    Last Pain:  Vitals:   11/14/20 1445  TempSrc:   PainSc: 0-No pain                 Merlinda Frederick

## 2020-11-15 ENCOUNTER — Encounter (HOSPITAL_BASED_OUTPATIENT_CLINIC_OR_DEPARTMENT_OTHER): Payer: Self-pay | Admitting: Plastic Surgery

## 2020-11-16 ENCOUNTER — Encounter: Payer: 59 | Admitting: Plastic Surgery

## 2020-11-21 NOTE — Progress Notes (Signed)
[facility]  [address] [place], [place] [address] Phone: [phone]  Fax: [phone]   Office Visit:  11/22/2020  Referring physician: Idelle Crouch, MD  Chief Complaint: Emma Atkinson is a 55 y.o. female with clinical stage IIB Her2/neu+ right breast cancer who is seen for 7 month assessment.   HPI: The patient was last seen in the medical oncology clinic on 05/05/2020. At that time, she was doing well.  Neuropathy was stable.  Memory had improved.  Exam revealed no evidence of recurrent disease. Hematocrit was 39.0, hemoglobin 12.7, MCV 94.9, platelets 362,000, WBC 10,900. CMP was normal. CA27.29 was 16.9. She continued Femara, calcium, and vitamin D. She opted to continue Effexor XR 37.5 mg a day.  The patient underwent revision of bilateral breast reconstruction on 11/14/2020 by Dr. Claudia Desanctis.  During the interim, she notes no breast concerns.  She notes that her implant was sliding under her arm.  Joint pain due to letrozole is "ok".  Hands have been better over the past 1 - 2 months.  She is taking gabapentin 300 mg at night.  She uses trazodone to sleep.  She still has hot flashes.  She denies any palpitations.  She has memory issues and is interested in Aricept.   Past Medical History:  Diagnosis Date  . Allergy   . Anemia    H/O  . Anxiety   . Blood transfusion without reported diagnosis   . Breast cancer ([place])    breast  . Chronic kidney disease 12-2015   PYLONEPHRITIS  . Complication of anesthesia    HARD TO WAKE UP AFTER TONSILLECTOMY AS A CHILD BUT NO PROBLEMS SINCE  . Complication of anesthesia    PT STATES THAT BACK IN OR DURING PORT PLACMENT IN 2016 THAT SHE BECAME VEERY FLUSHED, LIGHT HEADED AND CRNA SAID ANTIBIOTIC MAY BE GOING IN IN TO FAST-RATE DECREASED AND PTS SYMPTOMS RESOLVED IMMEDIATELY  . Depression   . GERD (gastroesophageal reflux disease)   . Hypertension   . PONV (postoperative nausea and vomiting)   .  Shortness of breath dyspnea     Past Surgical History:  Procedure Laterality Date  . ABDOMINAL HYSTERECTOMY  2001  . BACK SURGERY     FOR SCOLIOSIS-HERRINGTON RODS IN PLACE  . BREAST BIOPSY Bilateral    08/17/2015   . BREAST RECONSTRUCTION Bilateral   . BREAST RECONSTRUCTION WITH PLACEMENT OF TISSUE EXPANDER AND FLEX HD (ACELLULAR HYDRATED DERMIS) Right 11/14/2020   Procedure: REVISION OF BILATERAL BREAST RECONSTRUCTION, RIGHT CAPSULORRHAPHY, PLACEMENT OF FLEX HD TO RIGHT BREAST, EXCHANGE OF BILATERAL IMPLANTS.;  Surgeon: Cindra Presume, MD;  Location: [facility];  Service: Plastics;  Laterality: Right;  . PORTACATH PLACEMENT N/A 08/30/2015   Procedure: INSERTION PORT-A-CATH;  Surgeon: Leonie Green, MD;  Location: [facility];  Service: General;  Laterality: N/A;  . SENTINEL NODE BIOPSY Bilateral 03/08/2016   Procedure: SENTINEL NODE BIOPSY;  Surgeon: Leonie Green, MD;  Location: [facility];  Service: General;  Laterality: Bilateral;  . SIMPLE MASTECTOMY WITH AXILLARY SENTINEL NODE BIOPSY Bilateral 03/08/2016   Procedure: SIMPLE MASTECTOMY;  Surgeon: Leonie Green, MD;  Location: [facility];  Service: General;  Laterality: Bilateral;  . TONSILLECTOMY      Family History  Problem Relation Age of Onset  . Breast cancer Maternal Aunt 64  . Cancer Father   . Cancer Sister   . Cancer Maternal Grandfather   . Colon cancer Neg Hx   .  Esophageal cancer Neg Hx   . Rectal cancer Neg Hx   . Stomach cancer Neg Hx     Social History:  reports that she quit smoking about 21 years ago. Her smoking use included cigarettes. She has a 10.00 pack-year smoking history. She has never used smokeless tobacco. She reports current alcohol use. She reports that she does not use drugs. Her husband states that she drinks 2-4 bottles of wine/week. She returned to full-time work on 10/24/2016. Her daughter was married in 01/2017.Her daughter will have her first grandchild in the next  few weeks.She works out on the treadmill 3 days/week.She is married to her husband, Barbaraann Rondo. She lives in [place]. The patient is alone  today.   Allergies:  Allergies  Allergen Reactions  . Ceftin [Cefuroxime Axetil] Rash    Current Medications: Current Outpatient Medications  Medication Sig Dispense Refill  . ALPRAZolam (XANAX) 0.25 MG tablet Take 0.0625 mg by mouth 3 (three) times daily as needed for anxiety.    Marland Kitchen amLODipine (NORVASC) 5 MG tablet Take by mouth.    Marland Kitchen BIOTIN PO Take by mouth.    . Calcium Carbonate-Vitamin D3 600-400 MG-UNIT TABS Take 1 tablet by mouth daily.     Marland Kitchen gabapentin (NEURONTIN) 300 MG capsule Take by mouth.    . hydrochlorothiazide (HYDRODIURIL) 12.5 MG tablet Take 12.5 mg by mouth daily.    Marland Kitchen letrozole (FEMARA) 2.5 MG tablet TAKE 1 TABLET BY MOUTH DAILY. 90 tablet 0  . Multiple Vitamin (MULTIVITAMIN) capsule Take 1 capsule by mouth daily.    . polyethylene glycol powder (GLYCOLAX/MIRALAX) powder Take 1 Container by mouth daily.    . propranolol (INDERAL) 20 MG tablet Take 20 mg by mouth 2 (two) times daily.    . traZODone (DESYREL) 50 MG tablet Take 50 mg by mouth at bedtime.    Marland Kitchen venlafaxine XR (EFFEXOR-XR) 37.5 MG 24 hr capsule TAKE 1 CAPSULE BY MOUTH DAILY WITH BREAKFAST. 90 capsule 0  . azithromycin (ZITHROMAX Z-PAK) 250 MG tablet Take 2 tablets (527m) once for 1 day, then 1 tablet (2547m once daily for 4 days. (Patient not taking: Reported on 11/22/2020) 6 each 0  . ondansetron (ZOFRAN) 4 MG tablet Take 1 tablet (4 mg total) by mouth every 8 (eight) hours as needed for nausea or vomiting. 20 tablet 0   No current facility-administered medications for this visit.    Review of Systems  Constitutional: Positive for diaphoresis (hot flashes). Negative for chills, fever, malaise/fatigue (mild) and weight loss (up 7 pounds).       Feels good.  HENT: Negative.  Negative for congestion, hearing loss, nosebleeds, sinus pain, sore throat and tinnitus.   Eyes:  Negative.  Negative for blurred vision, double vision, photophobia and pain.  Respiratory: Negative.  Negative for cough, sputum production, shortness of breath and wheezing.   Cardiovascular: Negative for chest pain, palpitations, orthopnea, leg swelling and PND.  Gastrointestinal: Negative.  Negative for abdominal pain, blood in stool, constipation, diarrhea, melena, nausea and vomiting.  Genitourinary: Negative.  Negative for dysuria, frequency, hematuria and urgency.  Musculoskeletal: Negative.  Negative for back pain, joint pain and myalgias.  Skin: Negative.  Negative for rash.  Neurological: Negative for dizziness, tremors, sensory change (neuropathy fingers > toes; improved), speech change, focal weakness, loss of consciousness, weakness and headaches.  Endo/Heme/Allergies: Negative.  Negative for environmental allergies. Does not bruise/bleed easily.  Psychiatric/Behavioral: Positive for memory loss. Negative for depression, hallucinations and substance abuse. The patient has insomnia (takes Trazadone).  The patient is not nervous/anxious.   All other systems reviewed and are negative.  Performance status (ECOG):  1  Vital Signs Blood pressure 128/70, pulse 78, temperature 97.6 F (36.4 C), temperature source Tympanic, resp. rate 16, weight 124 lb 1.9 oz (56.3 kg), SpO2 100 %.  Physical Exam Vitals and nursing note reviewed.  Constitutional:      General: She is not in acute distress.    Appearance: Normal appearance. She is well-developed. She is not diaphoretic.     Interventions: Face mask in place.  HENT:     Head: Normocephalic and atraumatic.     Comments: Long blonde hair.    Mouth/Throat:     Mouth: Mucous membranes are moist. No oral lesions.  Eyes:     General: No scleral icterus.    Conjunctiva/sclera: Conjunctivae normal.     Pupils: Pupils are equal, round, and reactive to light.     Comments: Blue eyes.  Cardiovascular:     Rate and Rhythm: Normal rate and  regular rhythm.     Heart sounds: Normal heart sounds. No murmur heard. No friction rub. No gallop.   Pulmonary:     Effort: Pulmonary effort is normal.     Breath sounds: Normal breath sounds. No wheezing, rhonchi or rales.  Chest:     Comments: Bilateral mastectomy and implants.  No erythema or nodularity. Right inferior edema. Abdominal:     General: Bowel sounds are normal.     Palpations: Abdomen is soft. There is no hepatomegaly, splenomegaly or mass.     Tenderness: There is no abdominal tenderness.  Musculoskeletal:        General: No tenderness. Normal range of motion.     Cervical back: Normal range of motion and neck supple.  Lymphadenopathy:     Cervical: No cervical adenopathy.     Right cervical: No superficial cervical adenopathy. Skin:    General: Skin is warm and dry.     Findings: No bruising, erythema, lesion or rash.  Neurological:     Mental Status: She is alert and oriented to person, place, and time.  Psychiatric:        Speech: Speech normal.        Behavior: Behavior normal.        Thought Content: Thought content normal.        Judgment: Judgment normal.    Appointment on 11/22/2020  Component Date Value Ref Range Status  . Sodium 11/22/2020 140  135 - 145 mmol/L Final  . Potassium 11/22/2020 3.9  3.5 - 5.1 mmol/L Final  . Chloride 11/22/2020 101  98 - 111 mmol/L Final  . CO2 11/22/2020 26  22 - 32 mmol/L Final  . Glucose, Bld 11/22/2020 99  70 - 99 mg/dL Final   Glucose reference range applies only to samples taken after fasting for at least 8 hours.  . BUN 11/22/2020 9  6 - 20 mg/dL Final  . Creatinine, Ser 11/22/2020 0.63  0.44 - 1.00 mg/dL Final  . Calcium 11/22/2020 9.6  8.9 - 10.3 mg/dL Final  . Total Protein 11/22/2020 7.4  6.5 - 8.1 g/dL Final  . Albumin 11/22/2020 4.1  3.5 - 5.0 g/dL Final  . AST 11/22/2020 25  15 - 41 U/L Final  . ALT 11/22/2020 27  0 - 44 U/L Final  . Alkaline Phosphatase 11/22/2020 78  38 - 126 U/L Final  . Total  Bilirubin 11/22/2020 0.7  0.3 - 1.2 mg/dL Final  . GFR,  Estimated 11/22/2020 >60  >60 mL/min Final   Comment: (NOTE) Calculated using the CKD-EPI Creatinine Equation (2021)   . Anion gap 11/22/2020 13  5 - 15 Final   Performed at [facility], [address], [place], [place] [address]  . WBC 11/22/2020 8.0  4.0 - 10.5 K/uL Final  . RBC 11/22/2020 4.10  3.87 - 5.11 MIL/uL Final  . Hemoglobin 11/22/2020 12.5  12.0 - 15.0 g/dL Final  . HCT 11/22/2020 37.8  36.0 - 46.0 % Final  . MCV 11/22/2020 92.2  80.0 - 100.0 fL Final  . MCH 11/22/2020 30.5  26.0 - 34.0 pg Final  . MCHC 11/22/2020 33.1  30.0 - 36.0 g/dL Final  . RDW 11/22/2020 12.7  11.5 - 15.5 % Final  . Platelets 11/22/2020 410* 150 - 400 K/uL Final  . nRBC 11/22/2020 0.0  0.0 - 0.2 % Final  . Neutrophils Relative % 11/22/2020 67  % Final  . Neutro Abs 11/22/2020 5.3  1.7 - 7.7 K/uL Final  . Lymphocytes Relative 11/22/2020 22  % Final  . Lymphs Abs 11/22/2020 1.8  0.7 - 4.0 K/uL Final  . Monocytes Relative 11/22/2020 8  % Final  . Monocytes Absolute 11/22/2020 0.6  0.1 - 1.0 K/uL Final  . Eosinophils Relative 11/22/2020 3  % Final  . Eosinophils Absolute 11/22/2020 0.2  0.0 - 0.5 K/uL Final  . Basophils Relative 11/22/2020 0  % Final  . Basophils Absolute 11/22/2020 0.0  0.0 - 0.1 K/uL Final  . Immature Granulocytes 11/22/2020 0  % Final  . Abs Immature Granulocytes 11/22/2020 0.03  0.00 - 0.07 K/uL Final   Performed at [facility], [address] [place], [place] [address]    Assessment:  SHANTY GINTY is a 55 y.o. female with clinical stage T2N1 (stage IIB) Her2/neu + right breast cancer s/p neoadjuvant chemotherapy and bilateral mastectomy. She completed a year of adjuvant Herceptin.  She presented with a minimally painful right breast mass with nipple inversion. Bilateral mammogram and ultrasound on 08/12/2015 revealed a 4.1 cm right superior breast mass containing pleomorphic  microcalcifications. There were several satellite lesions (1.3 cm, 0.9 cm, 0.6 cm) plus a 1.3 cm mass versus complicated cyst and a 0.8 cm cyst. There was one 5 mm right axillary node with irregular thickened cortex.   Core needle biopsy on 08/17/2015 of the right breast mass revealed a grade 3 invasive mammary carcinoma. Right axillary biopsy confirmed metastatic mammary carcinoma. Tumor was ER positive (1-10%), PR positive (11-50%) and HER-2/neu 3+  PET scanon 08/26/2015 revealed hypermetabolic right breast mass (SUV 11.3) with a hypermetabolic right axillary lymph node and a subtle focus of very faint hypermetabolic activity laterally in the right breast. There was faint hypermetabolic activity at the biopsy site of the left breast (? biopsy related).   CA27.29has been followed: 19.6 on 08/22/2015, 24.7 on 01/10/2016, 15.5 on 03/23/2016, 16.3 on 12/06/2016, 17.4 on 03/05/2017, 11.7 on 04/25/2017, 15.4 on 07/01/2017, 12.9 on 10/28/2017, 18.0 on 03/20/2018, 15.0 on 09/23/2018, 13.2 on 07/22/2019, 19.9 on 11/04/2019, and 16.9 on 05/05/2020.  She received 4 cycles of AC(09/13/2015 - 11/08/2015) with Neulasta support. She received 4 cycles of Herceptin and Perjeta(11/29/2015 - 01/31/2016) and 12 weeks of Taxol(11/29/2015 - 02/14/2016). She tolerated her chemotherapy well. She developed a grade I neuropathy. She completed a year of adjuvant Herceptinon 11/15/2016. She began tamoxifenon 07/05/2016(held 07/23/2019).   She was on neratinib from 12/06/2016 - 01/01/2017. She had significant diarrhea affecting quality  of life. She has ssues with constipation (baseline on Miralax). She was on Effexorfor vasomotor symptoms (discontinued in 01/2018).  Bilateral mastectomyon 03/08/2016 revealed no residual disease in the right breast. Five lymph nodes were negative for malignancy. Pathologic stage was ypT0 ypN0. Left breast revealed benign intraductal papillomas and cysts adjacent to the  biopsy site. There was no atypia or malignancy. She underwent reconstructionin 09/2016 or 10/2016 then implant removal in 08/2017 secondary to infection.   She underwent right breast reconstructionwith latissimus flap and insertion of tissue expander and left breast reconstruction with insertion of tissue expander and removal of right posterior arm skin tag on 04/04/2018. She underwent second stage breast reconstructionwith exchange and touch up with fat grafting on 06/27/2018.She underwent ascar revision and left breast lifton 10/03/2018.  She underwent revision of bilateral breast reconstruction on 11/14/2020.  She received 50.4 Gyright chest wall radiation from 05/07/2016 - 06/25/2016. She underwent reconstructive surgery on 10/10/2016. She completed reconstruction on 01/11/2017.Shebegantamoxifenon 07/05/2016. She discontinued tamoxifen on 07/22/2019 and began Femara on 08/06/2019.  She is s/p partial hysterectomy(ovaries remain) in 2001. She was premenopausal (Vilas 5 and estradiol 271.9) on 08/22/2015. She is post-menopauasal (BWG66.5LDJ estradiol <5.0) on10/04/2019.  Echoon 08/26/2015 revealed an ejection fraction of 55-60%. Echo on 11/25/2015 revealed an EF of 60-65%. Echo on 02/23/2016 and 05/25/2016 revealed an EF of 55-65%. Echo on 08/31/2016 revealed an EF of 50-55%. Echo on 11/29/2016 revealed an EF of 60-65%.  She has a family history of breast and ovarian cancer. My Risk genetic testingrevealed no mutation on 08/22/2015.  Colonoscopyon 09/25/2018 revealed mild diverticulosis in the sigmoid colon, descending colon, and ascending colon.There was one 2 mm polyp in the proximal ascending colon(sessile serrated polyp:no high grade dysplasia or malignancy).   Bone density on 10/28/2018 revealed osteopeniawith a T-score of -1.1 at the femur left neck.  She has short term memoryissues. Head CTon 07/24/2019 revealedno evidence ofmetastatic  disease.  She is on the Remember study (WF [id number]).  Her memory has improved significantly.  The patient received the COVID-19 vaccine. She received the [employer] on 09/16/2020.  Symptomatically, she is doing well.  She still has hot flashes.  Joint pain associated with the letrozole has improved.  She has memory issues.  Exam reveals no evidence of recurrent disease.  Plan: 1.   Labs today: CBC with diff, CMP, CA 27.29. 2.   Stage IIB Her2/neu + right breast cancer Clinically,she is doing well. Exam feels no evidence of recurrent disease. She was on tamoxifen from  07/05/2016 - 07/22/2019. She continues letrozole (began 08/06/2019).  Discuss BCI testing and issues associated with HER-2/neu positive disease.    Continue every 50-monthsurveillance. 3.Cognitive concerns She has had short term memory issues s/p chemotherapy. Head CT was negative. She enrolled on the Memory study (WF [id number]).   Cognition improved.   She is now off study.  Follow-up with clinical trials regarding reinstitution of Aricept off trial. 4.   Osteopenia Continue calcium and vitamin D.  Next bone density study due on 10/28/2020. 5.Vasomotor symptoms  Patient has some vasomotor symptoms.  She remains on Effexor XR 37.5 mg a day.  Patient declines increase in dose. 6.   RN: BCI testing information. 7.   MD: talk to clinical trials. 8.   RTC in 6 months for MD assess and labs (CBC with diff, CMP, CA 27.29).  Addendum: Patient is able to take Aricept off clinical trial.  I discussed the assessment and treatment plan with the patient.  The  patient was provided an opportunity to ask questions and all were answered.  The patient agreed with the plan and demonstrated an understanding of the instructions.  The patient was advised to call back if the symptoms worsen or if the condition fails to improve as  anticipated.   Lequita Asal, MD, PhD    11/22/2020, 1:51 PM

## 2020-11-22 ENCOUNTER — Inpatient Hospital Stay: Payer: 59 | Attending: Hematology and Oncology

## 2020-11-22 ENCOUNTER — Encounter: Payer: Self-pay | Admitting: Hematology and Oncology

## 2020-11-22 ENCOUNTER — Inpatient Hospital Stay (HOSPITAL_BASED_OUTPATIENT_CLINIC_OR_DEPARTMENT_OTHER): Payer: 59 | Admitting: Hematology and Oncology

## 2020-11-22 ENCOUNTER — Other Ambulatory Visit: Payer: Self-pay

## 2020-11-22 VITALS — BP 128/70 | HR 78 | Temp 97.6°F | Resp 16 | Wt 124.1 lb

## 2020-11-22 DIAGNOSIS — R413 Other amnesia: Secondary | ICD-10-CM | POA: Diagnosis not present

## 2020-11-22 DIAGNOSIS — M85852 Other specified disorders of bone density and structure, left thigh: Secondary | ICD-10-CM | POA: Diagnosis not present

## 2020-11-22 DIAGNOSIS — N6002 Solitary cyst of left breast: Secondary | ICD-10-CM | POA: Diagnosis not present

## 2020-11-22 DIAGNOSIS — D125 Benign neoplasm of sigmoid colon: Secondary | ICD-10-CM | POA: Insufficient documentation

## 2020-11-22 DIAGNOSIS — Z9013 Acquired absence of bilateral breasts and nipples: Secondary | ICD-10-CM | POA: Diagnosis not present

## 2020-11-22 DIAGNOSIS — R232 Flushing: Secondary | ICD-10-CM | POA: Insufficient documentation

## 2020-11-22 DIAGNOSIS — K5900 Constipation, unspecified: Secondary | ICD-10-CM | POA: Diagnosis not present

## 2020-11-22 DIAGNOSIS — Z809 Family history of malignant neoplasm, unspecified: Secondary | ICD-10-CM | POA: Insufficient documentation

## 2020-11-22 DIAGNOSIS — Z170 Estrogen receptor positive status [ER+]: Secondary | ICD-10-CM | POA: Insufficient documentation

## 2020-11-22 DIAGNOSIS — C50811 Malignant neoplasm of overlapping sites of right female breast: Secondary | ICD-10-CM | POA: Diagnosis not present

## 2020-11-22 DIAGNOSIS — Z79811 Long term (current) use of aromatase inhibitors: Secondary | ICD-10-CM | POA: Diagnosis not present

## 2020-11-22 DIAGNOSIS — Z87891 Personal history of nicotine dependence: Secondary | ICD-10-CM | POA: Diagnosis not present

## 2020-11-22 DIAGNOSIS — Z8041 Family history of malignant neoplasm of ovary: Secondary | ICD-10-CM | POA: Insufficient documentation

## 2020-11-22 DIAGNOSIS — Z79899 Other long term (current) drug therapy: Secondary | ICD-10-CM | POA: Insufficient documentation

## 2020-11-22 DIAGNOSIS — Z881 Allergy status to other antibiotic agents status: Secondary | ICD-10-CM | POA: Diagnosis not present

## 2020-11-22 DIAGNOSIS — Z803 Family history of malignant neoplasm of breast: Secondary | ICD-10-CM | POA: Diagnosis not present

## 2020-11-22 DIAGNOSIS — M2550 Pain in unspecified joint: Secondary | ICD-10-CM | POA: Insufficient documentation

## 2020-11-22 DIAGNOSIS — Z9221 Personal history of antineoplastic chemotherapy: Secondary | ICD-10-CM | POA: Insufficient documentation

## 2020-11-22 DIAGNOSIS — D124 Benign neoplasm of descending colon: Secondary | ICD-10-CM | POA: Insufficient documentation

## 2020-11-22 DIAGNOSIS — G629 Polyneuropathy, unspecified: Secondary | ICD-10-CM | POA: Diagnosis not present

## 2020-11-22 DIAGNOSIS — D122 Benign neoplasm of ascending colon: Secondary | ICD-10-CM | POA: Diagnosis not present

## 2020-11-22 DIAGNOSIS — M8580 Other specified disorders of bone density and structure, unspecified site: Secondary | ICD-10-CM | POA: Insufficient documentation

## 2020-11-22 DIAGNOSIS — N644 Mastodynia: Secondary | ICD-10-CM | POA: Diagnosis not present

## 2020-11-22 DIAGNOSIS — Z79810 Long term (current) use of selective estrogen receptor modulators (SERMs): Secondary | ICD-10-CM | POA: Diagnosis not present

## 2020-11-22 DIAGNOSIS — K5730 Diverticulosis of large intestine without perforation or abscess without bleeding: Secondary | ICD-10-CM | POA: Insufficient documentation

## 2020-11-22 DIAGNOSIS — N951 Menopausal and female climacteric states: Secondary | ICD-10-CM | POA: Diagnosis not present

## 2020-11-22 DIAGNOSIS — C50111 Malignant neoplasm of central portion of right female breast: Secondary | ICD-10-CM | POA: Diagnosis not present

## 2020-11-22 LAB — CBC WITH DIFFERENTIAL/PLATELET
Abs Immature Granulocytes: 0.03 10*3/uL (ref 0.00–0.07)
Basophils Absolute: 0 10*3/uL (ref 0.0–0.1)
Basophils Relative: 0 %
Eosinophils Absolute: 0.2 10*3/uL (ref 0.0–0.5)
Eosinophils Relative: 3 %
HCT: 37.8 % (ref 36.0–46.0)
Hemoglobin: 12.5 g/dL (ref 12.0–15.0)
Immature Granulocytes: 0 %
Lymphocytes Relative: 22 %
Lymphs Abs: 1.8 10*3/uL (ref 0.7–4.0)
MCH: 30.5 pg (ref 26.0–34.0)
MCHC: 33.1 g/dL (ref 30.0–36.0)
MCV: 92.2 fL (ref 80.0–100.0)
Monocytes Absolute: 0.6 10*3/uL (ref 0.1–1.0)
Monocytes Relative: 8 %
Neutro Abs: 5.3 10*3/uL (ref 1.7–7.7)
Neutrophils Relative %: 67 %
Platelets: 410 10*3/uL — ABNORMAL HIGH (ref 150–400)
RBC: 4.1 MIL/uL (ref 3.87–5.11)
RDW: 12.7 % (ref 11.5–15.5)
WBC: 8 10*3/uL (ref 4.0–10.5)
nRBC: 0 % (ref 0.0–0.2)

## 2020-11-22 LAB — COMPREHENSIVE METABOLIC PANEL
ALT: 27 U/L (ref 0–44)
AST: 25 U/L (ref 15–41)
Albumin: 4.1 g/dL (ref 3.5–5.0)
Alkaline Phosphatase: 78 U/L (ref 38–126)
Anion gap: 13 (ref 5–15)
BUN: 9 mg/dL (ref 6–20)
CO2: 26 mmol/L (ref 22–32)
Calcium: 9.6 mg/dL (ref 8.9–10.3)
Chloride: 101 mmol/L (ref 98–111)
Creatinine, Ser: 0.63 mg/dL (ref 0.44–1.00)
GFR, Estimated: >60 mL/min (ref >60)
Glucose, Bld: 99 mg/dL (ref 70–99)
Potassium: 3.9 mmol/L (ref 3.5–5.1)
Sodium: 140 mmol/L (ref 135–145)
Total Bilirubin: 0.7 mg/dL (ref 0.3–1.2)
Total Protein: 7.4 g/dL (ref 6.5–8.1)

## 2020-11-23 ENCOUNTER — Ambulatory Visit (INDEPENDENT_AMBULATORY_CARE_PROVIDER_SITE_OTHER): Payer: 59 | Admitting: Plastic Surgery

## 2020-11-23 ENCOUNTER — Encounter: Payer: Self-pay | Admitting: Plastic Surgery

## 2020-11-23 ENCOUNTER — Other Ambulatory Visit: Payer: Self-pay

## 2020-11-23 VITALS — BP 129/81 | HR 77 | Temp 97.8°F

## 2020-11-23 DIAGNOSIS — C50811 Malignant neoplasm of overlapping sites of right female breast: Secondary | ICD-10-CM

## 2020-11-23 DIAGNOSIS — Z170 Estrogen receptor positive status [ER+]: Secondary | ICD-10-CM

## 2020-11-23 LAB — CANCER ANTIGEN 27.29: CA 27.29: 13 U/mL (ref 0.0–38.6)

## 2020-11-23 NOTE — Progress Notes (Signed)
Patient resents 1 week postop from bilateral breast reconstruction revision.  She was primarily bothered by lateral displacement of the right implant.  I performed a lateral capsulorrhaphy on that side combined with medial capsulotomy and placed a smaller implant.  I did remove and replace her left-sided implant with a slightly larger 1 which looks more symmetric.  She is currently very happy and feels like her concerns were addressed.  She feels like her axilla is no longer impeded by the implant which was her primary focus.  On exam her incisions are healing fine.  I do not detect any subcutaneous swelling.  Her shape size and symmetry are improved from preoperative.  Overall I think things are going well and we will plan to see her again in a few weeks.  Of asked her to avoid strenuous activity and continue compressive garments.  All of her questions were answered.

## 2020-11-30 ENCOUNTER — Other Ambulatory Visit: Payer: Self-pay | Admitting: Internal Medicine

## 2020-11-30 ENCOUNTER — Other Ambulatory Visit: Payer: Self-pay | Admitting: Hematology and Oncology

## 2020-11-30 DIAGNOSIS — C50111 Malignant neoplasm of central portion of right female breast: Secondary | ICD-10-CM

## 2020-11-30 DIAGNOSIS — Z170 Estrogen receptor positive status [ER+]: Secondary | ICD-10-CM

## 2020-12-02 ENCOUNTER — Other Ambulatory Visit: Payer: Self-pay | Admitting: Internal Medicine

## 2020-12-02 DIAGNOSIS — R7309 Other abnormal glucose: Secondary | ICD-10-CM | POA: Diagnosis not present

## 2020-12-02 DIAGNOSIS — F419 Anxiety disorder, unspecified: Secondary | ICD-10-CM | POA: Diagnosis not present

## 2020-12-02 DIAGNOSIS — C50811 Malignant neoplasm of overlapping sites of right female breast: Secondary | ICD-10-CM | POA: Diagnosis not present

## 2020-12-02 DIAGNOSIS — I10 Essential (primary) hypertension: Secondary | ICD-10-CM | POA: Diagnosis not present

## 2020-12-13 ENCOUNTER — Other Ambulatory Visit: Payer: Self-pay | Admitting: Hematology and Oncology

## 2020-12-20 ENCOUNTER — Ambulatory Visit (INDEPENDENT_AMBULATORY_CARE_PROVIDER_SITE_OTHER): Payer: 59 | Admitting: Surgical

## 2020-12-20 ENCOUNTER — Encounter: Payer: Self-pay | Admitting: Surgical

## 2020-12-20 ENCOUNTER — Other Ambulatory Visit: Payer: Self-pay

## 2020-12-20 VITALS — BP 166/91 | HR 77

## 2020-12-20 DIAGNOSIS — C50811 Malignant neoplasm of overlapping sites of right female breast: Secondary | ICD-10-CM

## 2020-12-20 DIAGNOSIS — Z170 Estrogen receptor positive status [ER+]: Secondary | ICD-10-CM

## 2020-12-20 NOTE — Progress Notes (Signed)
Patient is a 55 year old female here for follow-up after bilateral breast reconstruction revision with Dr. Claudia Desanctis on 11/14/2020.  She underwent a lateral capsulorrhaphy on the right side combined with medial capsulotomy and placement of a smaller implant.  Her left sided implant was removed and a slightly larger 1 was placed.  She is 5 weeks postop Patient reports she is doing well, she is very pleased with her reconstruction.  She has some questions about restrictions.  She also has some questions about a mass she has had on her forehead for many years.  Chaperone present on exam On exam bilateral breast incisions are intact, bilateral breasts are symmetric.  No subcutaneous fluid noted with palpation.  No erythema noted.  Patient with forehead lesion that is mobile, approximately 7 x 7 mm.  No surrounding erythema or overlying skin changes noted.  Feels consistent with a cyst.  It is not fixated to the skull  Recommend continue to avoid strenuous activities or heavy lifting until 6 weeks after surgery.  Recommend continue her compressive garment 24/7 until 6 weeks after surgery Discussed with patient we can set up a consultation with Dr. Claudia Desanctis to evaluate her forehead for possible excision in the clinic or in the operating room. We discussed scheduling additional follow-up or following up on an as-needed basis, patient is comfortable following up as needed.  There is no sign of infection, seroma, hematoma.  She is very pleased with her reconstruction.  We discussed NAC tattooing, patient reports she is not interested at this time.  Pictures were obtained of the patient and placed in the chart with the patient's or guardian's permission.

## 2021-01-10 ENCOUNTER — Encounter: Payer: Self-pay | Admitting: Hematology and Oncology

## 2021-01-18 MED FILL — Trazodone HCl Tab 50 MG: ORAL | 30 days supply | Qty: 30 | Fill #0 | Status: AC

## 2021-01-19 ENCOUNTER — Other Ambulatory Visit: Payer: Self-pay

## 2021-01-24 ENCOUNTER — Other Ambulatory Visit: Payer: Self-pay

## 2021-01-24 ENCOUNTER — Other Ambulatory Visit: Payer: Self-pay | Admitting: Internal Medicine

## 2021-01-24 MED ORDER — AMLODIPINE BESYLATE 5 MG PO TABS
ORAL_TABLET | Freq: Every day | ORAL | 5 refills | Status: DC
  Filled 2021-01-24: qty 90, 90d supply, fill #0
  Filled 2021-04-30: qty 90, 90d supply, fill #1

## 2021-01-24 MED FILL — Gabapentin Cap 300 MG: ORAL | 30 days supply | Qty: 30 | Fill #0 | Status: AC

## 2021-01-25 ENCOUNTER — Institutional Professional Consult (permissible substitution): Payer: 59 | Admitting: Plastic Surgery

## 2021-02-08 ENCOUNTER — Other Ambulatory Visit: Payer: Self-pay

## 2021-02-08 ENCOUNTER — Ambulatory Visit (INDEPENDENT_AMBULATORY_CARE_PROVIDER_SITE_OTHER): Payer: 59 | Admitting: Plastic Surgery

## 2021-02-08 DIAGNOSIS — C50811 Malignant neoplasm of overlapping sites of right female breast: Secondary | ICD-10-CM | POA: Diagnosis not present

## 2021-02-08 DIAGNOSIS — Z170 Estrogen receptor positive status [ER+]: Secondary | ICD-10-CM

## 2021-02-09 ENCOUNTER — Other Ambulatory Visit (HOSPITAL_COMMUNITY)
Admission: RE | Admit: 2021-02-09 | Discharge: 2021-02-09 | Disposition: A | Discharge status: Home / Self Care | Payer: 59 | Source: Ambulatory Visit | Attending: Plastic Surgery | Admitting: Plastic Surgery

## 2021-02-09 DIAGNOSIS — L089 Local infection of the skin and subcutaneous tissue, unspecified: Secondary | ICD-10-CM | POA: Diagnosis not present

## 2021-02-09 DIAGNOSIS — L905 Scar conditions and fibrosis of skin: Secondary | ICD-10-CM | POA: Diagnosis not present

## 2021-02-09 DIAGNOSIS — C50811 Malignant neoplasm of overlapping sites of right female breast: Secondary | ICD-10-CM | POA: Diagnosis not present

## 2021-02-09 DIAGNOSIS — Z170 Estrogen receptor positive status [ER+]: Secondary | ICD-10-CM | POA: Diagnosis not present

## 2021-02-09 DIAGNOSIS — L929 Granulomatous disorder of the skin and subcutaneous tissue, unspecified: Secondary | ICD-10-CM | POA: Diagnosis not present

## 2021-02-09 NOTE — Progress Notes (Signed)
Operative Note   DATE OF OPERATION: 02/09/2021  LOCATION:    SURGICAL DEPARTMENT: Plastic Surgery  PREOPERATIVE DIAGNOSES: Forehead cystic lesion  POSTOPERATIVE DIAGNOSES:  same  PROCEDURE:  1. Excision of forehead cyst measuring 2.5 cm 2. Complex closure measuring 2.5 cm  SURGEON: Talmadge Coventry, MD  ANESTHESIA:  Local  COMPLICATIONS: None.   INDICATIONS FOR PROCEDURE:  The patient, Emma Atkinson is a 55 y.o. female born on 1966-06-01, is here for treatment of forehead cyst MRN: [id number]  CONSENT:  Informed consent was obtained directly from the patient. Risks, benefits and alternatives were fully discussed. Specific risks including but not limited to bleeding, infection, hematoma, seroma, scarring, pain, infection, wound healing problems, and need for further surgery were all discussed. The patient did have an ample opportunity to have questions answered to satisfaction.   DESCRIPTION OF PROCEDURE:  Local anesthesia was administered. The patient's operative site was prepped and draped in a sterile fashion. A time out was performed and all information was confirmed to be correct.  The lesion was excised with a 15 blade.  Hemostasis was obtained.  Circumferential undermining was performed and the skin was advanced and closed in layers with interrupted buried Monocryl sutures and 5-0 fast gut for the skin.  The lesion excised measured 2.5 cm, and the total length of closure measured 2.5 cm.    The patient tolerated the procedure well.  There were no complications.

## 2021-02-13 LAB — SURGICAL PATHOLOGY

## 2021-02-27 ENCOUNTER — Other Ambulatory Visit: Payer: Self-pay

## 2021-02-27 MED ORDER — HYDROCHLOROTHIAZIDE 12.5 MG PO TABS
12.5000 mg | ORAL_TABLET | Freq: Every day | ORAL | 11 refills | Status: DC
  Filled 2021-02-27: qty 90, 90d supply, fill #0
  Filled 2021-05-29: qty 90, 90d supply, fill #1
  Filled 2021-08-25: qty 90, 90d supply, fill #2
  Filled 2021-11-08: qty 90, 90d supply, fill #3

## 2021-02-27 MED ORDER — TRAZODONE HCL 50 MG PO TABS
ORAL_TABLET | ORAL | 5 refills | Status: AC
  Filled 2021-02-27: qty 30, 30d supply, fill #0
  Filled 2021-05-29: qty 30, 30d supply, fill #1
  Filled 2021-08-31: qty 30, 30d supply, fill #2
  Filled 2022-02-26: qty 30, 30d supply, fill #3

## 2021-02-27 MED FILL — Gabapentin Cap 300 MG: ORAL | 30 days supply | Qty: 30 | Fill #1 | Status: AC

## 2021-02-27 MED FILL — Propranolol HCl Tab 20 MG: ORAL | 90 days supply | Qty: 180 | Fill #0 | Status: AC

## 2021-03-06 ENCOUNTER — Other Ambulatory Visit: Payer: Self-pay | Admitting: Oncology

## 2021-03-07 ENCOUNTER — Other Ambulatory Visit: Payer: Self-pay

## 2021-03-08 ENCOUNTER — Other Ambulatory Visit: Payer: Self-pay

## 2021-03-09 ENCOUNTER — Other Ambulatory Visit: Payer: Self-pay

## 2021-03-09 ENCOUNTER — Encounter: Payer: Self-pay | Admitting: Hematology and Oncology

## 2021-03-09 MED ORDER — VENLAFAXINE HCL ER 37.5 MG PO CP24
ORAL_CAPSULE | Freq: Every day | ORAL | 0 refills | Status: AC
  Filled 2021-03-09: qty 90, 90d supply, fill #0

## 2021-03-29 ENCOUNTER — Other Ambulatory Visit: Payer: Self-pay

## 2021-03-29 MED ORDER — GABAPENTIN 300 MG PO CAPS
1.0000 | ORAL_CAPSULE | Freq: Every day | ORAL | 5 refills | Status: AC
  Filled 2021-03-29: qty 30, 30d supply, fill #0
  Filled 2021-05-04: qty 30, 30d supply, fill #1
  Filled 2021-06-06: qty 30, 30d supply, fill #2
  Filled 2021-07-09 – 2021-07-10 (×2): qty 30, 30d supply, fill #3
  Filled 2021-08-10: qty 30, 30d supply, fill #4
  Filled 2021-09-03: qty 30, 30d supply, fill #5

## 2021-03-29 MED ORDER — ALPRAZOLAM 0.25 MG PO TABS
ORAL_TABLET | ORAL | 5 refills | Status: DC
  Filled 2021-03-29: qty 60, 30d supply, fill #0
  Filled 2021-05-29: qty 60, 30d supply, fill #1
  Filled 2021-07-18: qty 60, 30d supply, fill #2

## 2021-04-27 ENCOUNTER — Encounter: Payer: Self-pay | Admitting: Oncology

## 2021-04-28 ENCOUNTER — Other Ambulatory Visit: Payer: Self-pay

## 2021-04-28 ENCOUNTER — Other Ambulatory Visit: Payer: Self-pay | Admitting: *Deleted

## 2021-04-28 DIAGNOSIS — C50111 Malignant neoplasm of central portion of right female breast: Secondary | ICD-10-CM

## 2021-04-28 MED ORDER — LETROZOLE 2.5 MG PO TABS
ORAL_TABLET | Freq: Every day | ORAL | 3 refills | Status: AC
Start: 2021-04-28 — End: 2022-04-28
  Filled 2021-04-28: qty 90, 90d supply, fill #0

## 2021-04-28 NOTE — Telephone Encounter (Signed)
For node positive her 2 positive breast cancer especially at her age, I would recommend 10 years not 5 years of hormone therapy. She needs to see me or someone from our practice for continued follow up. Go ahead and refill femara in the [facility]

## 2021-05-01 ENCOUNTER — Other Ambulatory Visit: Payer: Self-pay

## 2021-05-05 ENCOUNTER — Other Ambulatory Visit: Payer: Self-pay

## 2021-05-22 ENCOUNTER — Ambulatory Visit: Payer: 59 | Admitting: Hematology and Oncology

## 2021-05-22 ENCOUNTER — Other Ambulatory Visit: Payer: 59

## 2021-05-23 ENCOUNTER — Other Ambulatory Visit: Payer: Self-pay

## 2021-05-23 DIAGNOSIS — C50111 Malignant neoplasm of central portion of right female breast: Secondary | ICD-10-CM

## 2021-05-23 DIAGNOSIS — Z170 Estrogen receptor positive status [ER+]: Secondary | ICD-10-CM

## 2021-05-24 ENCOUNTER — Inpatient Hospital Stay: Payer: 59 | Attending: Oncology

## 2021-05-24 ENCOUNTER — Inpatient Hospital Stay (HOSPITAL_BASED_OUTPATIENT_CLINIC_OR_DEPARTMENT_OTHER): Payer: 59 | Admitting: Oncology

## 2021-05-24 ENCOUNTER — Other Ambulatory Visit: Payer: Self-pay

## 2021-05-24 VITALS — BP 146/90 | HR 69 | Temp 97.5°F | Resp 18 | Wt 122.9 lb

## 2021-05-24 DIAGNOSIS — Z9221 Personal history of antineoplastic chemotherapy: Secondary | ICD-10-CM | POA: Insufficient documentation

## 2021-05-24 DIAGNOSIS — Z881 Allergy status to other antibiotic agents status: Secondary | ICD-10-CM | POA: Diagnosis not present

## 2021-05-24 DIAGNOSIS — Z853 Personal history of malignant neoplasm of breast: Secondary | ICD-10-CM

## 2021-05-24 DIAGNOSIS — Z79899 Other long term (current) drug therapy: Secondary | ICD-10-CM | POA: Insufficient documentation

## 2021-05-24 DIAGNOSIS — Z809 Family history of malignant neoplasm, unspecified: Secondary | ICD-10-CM | POA: Diagnosis not present

## 2021-05-24 DIAGNOSIS — Z08 Encounter for follow-up examination after completed treatment for malignant neoplasm: Secondary | ICD-10-CM | POA: Diagnosis not present

## 2021-05-24 DIAGNOSIS — Z9013 Acquired absence of bilateral breasts and nipples: Secondary | ICD-10-CM | POA: Insufficient documentation

## 2021-05-24 DIAGNOSIS — C50811 Malignant neoplasm of overlapping sites of right female breast: Secondary | ICD-10-CM | POA: Diagnosis not present

## 2021-05-24 DIAGNOSIS — C50111 Malignant neoplasm of central portion of right female breast: Secondary | ICD-10-CM

## 2021-05-24 DIAGNOSIS — Z170 Estrogen receptor positive status [ER+]: Secondary | ICD-10-CM | POA: Insufficient documentation

## 2021-05-24 DIAGNOSIS — R5383 Other fatigue: Secondary | ICD-10-CM | POA: Insufficient documentation

## 2021-05-24 DIAGNOSIS — Z87891 Personal history of nicotine dependence: Secondary | ICD-10-CM | POA: Insufficient documentation

## 2021-05-24 DIAGNOSIS — Z803 Family history of malignant neoplasm of breast: Secondary | ICD-10-CM | POA: Insufficient documentation

## 2021-05-24 LAB — COMPREHENSIVE METABOLIC PANEL
ALT: 19 U/L (ref 0–44)
AST: 22 U/L (ref 15–41)
Albumin: 4.4 g/dL (ref 3.5–5.0)
Alkaline Phosphatase: 82 U/L (ref 38–126)
Anion gap: 6 (ref 5–15)
BUN: 14 mg/dL (ref 6–20)
CO2: 28 mmol/L (ref 22–32)
Calcium: 9 mg/dL (ref 8.9–10.3)
Chloride: 102 mmol/L (ref 98–111)
Creatinine, Ser: 0.59 mg/dL (ref 0.44–1.00)
GFR, Estimated: >60 mL/min (ref >60)
Glucose, Bld: 89 mg/dL (ref 70–99)
Potassium: 3.5 mmol/L (ref 3.5–5.1)
Sodium: 136 mmol/L (ref 135–145)
Total Bilirubin: 0.4 mg/dL (ref 0.3–1.2)
Total Protein: 7.5 g/dL (ref 6.5–8.1)

## 2021-05-24 LAB — CBC WITH DIFFERENTIAL/PLATELET
Abs Immature Granulocytes: 0.03 10*3/uL (ref 0.00–0.07)
Basophils Absolute: 0 10*3/uL (ref 0.0–0.1)
Basophils Relative: 0 %
Eosinophils Absolute: 0.1 10*3/uL (ref 0.0–0.5)
Eosinophils Relative: 1 %
HCT: 36.7 % (ref 36.0–46.0)
Hemoglobin: 12.1 g/dL (ref 12.0–15.0)
Immature Granulocytes: 0 %
Lymphocytes Relative: 22 %
Lymphs Abs: 1.7 10*3/uL (ref 0.7–4.0)
MCH: 31.2 pg (ref 26.0–34.0)
MCHC: 33 g/dL (ref 30.0–36.0)
MCV: 94.6 fL (ref 80.0–100.0)
Monocytes Absolute: 0.5 10*3/uL (ref 0.1–1.0)
Monocytes Relative: 6 %
Neutro Abs: 5.4 10*3/uL (ref 1.7–7.7)
Neutrophils Relative %: 71 %
Platelets: 334 10*3/uL (ref 150–400)
RBC: 3.88 MIL/uL (ref 3.87–5.11)
RDW: 13.3 % (ref 11.5–15.5)
WBC: 7.8 10*3/uL (ref 4.0–10.5)
nRBC: 0 % (ref 0.0–0.2)

## 2021-05-24 NOTE — Progress Notes (Signed)
Pt was not able to tolerate donezepril so she completlely stopped it. Tried it for about two months, made her nauseous, tired, tried different times of the day and nothing would work.

## 2021-05-25 ENCOUNTER — Encounter: Payer: Self-pay | Admitting: Oncology

## 2021-05-25 ENCOUNTER — Encounter: Payer: Self-pay | Admitting: Hematology and Oncology

## 2021-05-25 LAB — CANCER ANTIGEN 27.29: CA 27.29: 13.1 U/mL (ref 0.0–38.6)

## 2021-05-25 NOTE — Progress Notes (Signed)
Hematology/Oncology Consult note [facility]  [phone] [phone]  Patient Care Team: Idelle Crouch, MD as PCP - General (Internal Medicine) Lequita Asal, MD (Inactive) as Referring Physician (Hematology and Oncology) Leonie Green, MD as Referring Physician (Surgery) Noreene Filbert, MD as Referring Physician (Radiation Oncology)   Name of the patient: Lerae Langham  [id number]  1966/07/12   Date of visit: 05/25/21  Diagnosis-history of stage II HER2 positive right breast cancer s/p bilateral mastectomy  Chief complaint/ Reason for visit-routine follow-up of breast cancer on letrozole  Heme/Onc history: DRIANA DAZEY is a 55 y.o. female with clinical stage T2N1 (stage IIB) Her2/neu + right breast cancer s/p neoadjuvant chemotherapy and bilateral mastectomy.She presented with a minimally painful right breast mass with nipple inversion.  Bilateral mammogram and ultrasound on 08/12/2015 revealed a 4.1 cm right superior breast mass containing pleomorphic microcalcifications. There were several satellite lesions (1.3 cm, 0.9 cm, 0.6 cm) plus a 1.3 cm mass versus complicated cyst and a 0.8 cm cyst.  There was one 5 mm right axillary node with irregular thickened cortex.    Core needle biopsy on 08/17/2015 of the right breast mass revealed a grade 3 invasive mammary carcinoma. Right axillary biopsy confirmed metastatic mammary carcinoma. Tumor was ER positive (1-10%), PR positive (11-50%) and HER-2/neu 3+. She received 4 cycles of AC (09/13/2015 - 11/08/2015) with Neulasta support.  She received 4 cycles of Herceptin and Perjeta (11/29/2015 - 01/31/2016) and 12 weeks of Taxol (11/29/2015 - 02/14/2016).  She tolerated her chemotherapy well.  She developed a grade I neuropathy.  She completed a year of adjuvant Herceptin on 11/15/2016.  She began tamoxifen on 07/05/2016 (held 07/23/2019).  She has then been taking letrozole on which she  has not been tolerating well due to significant fatigue.  Bilateral mastectomy on 03/08/2016 revealed no residual disease in the right breast.  Five lymph nodes were negative for malignancy.  Pathologic stage was ypT0 ypN0.  Left breast revealed benign intraductal papillomas and cysts adjacent to the biopsy site.  There was no atypia or malignancy.  She underwent reconstruction in 09/2016 or 10/2016 then implant removal in 08/2017 secondary to infection.     She underwent right breast reconstruction with latissimus flap and insertion of tissue expander and left breast reconstruction with insertion of tissue expander and removal of right posterior arm skin tag on 04/04/2018.  She underwent second stage breast reconstruction with exchange and touch up with fat grafting on 06/27/2018. She underwent a scar revision and left breast lift on 10/03/2018.  She underwent revision of bilateral breast reconstruction on 11/14/2020.  She also completed postmastectomy radiation.  Genetic testing negative.    She was on neratinib from 12/06/2016 - 01/01/2017.  She had significant diarrhea affecting quality of life.  She has ssues with constipation (baseline on Miralax).  She was on Effexor for vasomotor symptoms (discontinued in 01/2018).   Interval history-patient is now at her 5-year mark with letrozole.  She continues to struggle taking the medication and reports significant fatigue as well as generalized body aches.  Reports issues with sleeping.  She was not on any blood pressure medications prior to breast cancer and hormone therapy and since starting taking letrozole she is now on 3 different antihypertensives.  ECOG PS- 0 Pain scale- 0   Review of systems- Review of Systems  Constitutional:  Positive for malaise/fatigue. Negative for chills, fever and weight loss.  HENT:  Negative for congestion, ear  discharge and nosebleeds.   Eyes:  Negative for blurred vision.  Respiratory:  Negative for cough,  hemoptysis, sputum production, shortness of breath and wheezing.   Cardiovascular:  Negative for chest pain, palpitations, orthopnea and claudication.  Gastrointestinal:  Negative for abdominal pain, blood in stool, constipation, diarrhea, heartburn, melena, nausea and vomiting.  Genitourinary:  Negative for dysuria, flank pain, frequency, hematuria and urgency.  Musculoskeletal:  Negative for back pain, joint pain and myalgias.  Skin:  Negative for rash.  Neurological:  Negative for dizziness, tingling, focal weakness, seizures, weakness and headaches.  Endo/Heme/Allergies:  Does not bruise/bleed easily.  Psychiatric/Behavioral:  Negative for depression and suicidal ideas. The patient has insomnia.       Allergies  Allergen Reactions   Ceftin [Cefuroxime Axetil] Rash     Past Medical History:  Diagnosis Date   Allergy    Anemia    H/O   Anxiety    Blood transfusion without reported diagnosis    Breast cancer ([place])    breast   Chronic kidney disease 12-2015   PYLONEPHRITIS   Complication of anesthesia    HARD TO WAKE UP AFTER TONSILLECTOMY AS A CHILD BUT NO PROBLEMS SINCE   Complication of anesthesia    PT STATES THAT BACK IN OR DURING PORT PLACMENT IN 2016 THAT SHE BECAME VEERY FLUSHED, LIGHT HEADED AND CRNA SAID ANTIBIOTIC MAY BE GOING IN IN TO FAST-RATE DECREASED AND PTS SYMPTOMS RESOLVED IMMEDIATELY   Depression    GERD (gastroesophageal reflux disease)    Hypertension    PONV (postoperative nausea and vomiting)    Shortness of breath dyspnea      Past Surgical History:  Procedure Laterality Date   ABDOMINAL HYSTERECTOMY  2001   BACK SURGERY     FOR SCOLIOSIS-HERRINGTON RODS IN PLACE   BREAST BIOPSY Bilateral    08/17/2015    BREAST RECONSTRUCTION Bilateral    BREAST RECONSTRUCTION WITH PLACEMENT OF TISSUE EXPANDER AND FLEX HD (ACELLULAR HYDRATED DERMIS) Right 11/14/2020   Procedure: REVISION OF BILATERAL BREAST RECONSTRUCTION, RIGHT CAPSULORRHAPHY, PLACEMENT OF  FLEX HD TO RIGHT BREAST, EXCHANGE OF BILATERAL IMPLANTS.;  Surgeon: Cindra Presume, MD;  Location: [facility];  Service: Plastics;  Laterality: Right;   PORTACATH PLACEMENT N/A 08/30/2015   Procedure: INSERTION PORT-A-CATH;  Surgeon: Leonie Green, MD;  Location: [facility];  Service: General;  Laterality: N/A;   SENTINEL NODE BIOPSY Bilateral 03/08/2016   Procedure: SENTINEL NODE BIOPSY;  Surgeon: Leonie Green, MD;  Location: [facility];  Service: General;  Laterality: Bilateral;   SIMPLE MASTECTOMY WITH AXILLARY SENTINEL NODE BIOPSY Bilateral 03/08/2016   Procedure: SIMPLE MASTECTOMY;  Surgeon: Leonie Green, MD;  Location: [facility];  Service: General;  Laterality: Bilateral;   TONSILLECTOMY      Social History   Socioeconomic History   Marital status: Married    Spouse name: Not on file   Number of children: Not on file   Years of education: Not on file   Highest education level: Not on file  Occupational History   Not on file  Tobacco Use   Smoking status: Former    Packs/day: 1.00    Years: 10.00    Pack years: 10.00    Types: Cigarettes    Quit date: 10/16/1999    Years since quitting: 21.6   Smokeless tobacco: Never  Vaping Use   Vaping Use: Never used  Substance and Sexual Activity   Alcohol use: Yes    Comment: daily  Drug use: No   Sexual activity: Yes  Other Topics Concern   Not on file  Social History Narrative   Not on file   Social Determinants of Health   Financial Resource Strain: Not on file  Food Insecurity: Not on file  Transportation Needs: Not on file  Physical Activity: Not on file  Stress: Not on file  Social Connections: Not on file  Intimate Partner Violence: Not on file    Family History  Problem Relation Age of Onset   Breast cancer Maternal Aunt 27   Cancer Father    Cancer Sister    Cancer Maternal Grandfather    Colon cancer Neg Hx    Esophageal cancer Neg Hx    Rectal cancer Neg Hx    Stomach  cancer Neg Hx      Current Outpatient Medications:    ALPRAZolam (XANAX) 0.25 MG tablet, Take 0.0625 mg by mouth 3 (three) times daily as needed for anxiety., Disp: , Rfl:    amLODipine (NORVASC) 5 MG tablet, Take by mouth., Disp: , Rfl:    BIOTIN PO, Take by mouth., Disp: , Rfl:    Calcium Carbonate-Vitamin D3 600-400 MG-UNIT TABS, Take 1 tablet by mouth daily. , Disp: , Rfl:    COVID-19 mRNA vaccine, Pfizer, 30 MCG/0.3ML injection, USE AS DIRECTED, Disp: .3 mL, Rfl: 0   gabapentin (NEURONTIN) 300 MG capsule, Take by mouth., Disp: , Rfl:    hydrochlorothiazide (HYDRODIURIL) 12.5 MG tablet, Take 12.5 mg by mouth daily., Disp: , Rfl:    letrozole (FEMARA) 2.5 MG tablet, TAKE 1 TABLET BY MOUTH DAILY, Disp: 90 tablet, Rfl: 3   Multiple Vitamin (MULTIVITAMIN) capsule, Take 1 capsule by mouth daily., Disp: , Rfl:    polyethylene glycol powder (GLYCOLAX/MIRALAX) powder, Take 1 Container by mouth daily., Disp: , Rfl:    propranolol (INDERAL) 20 MG tablet, Take 20 mg by mouth 2 (two) times daily., Disp: , Rfl:    propranolol (INDERAL) 20 MG tablet, TAKE 1 TABLET BY MOUTH 2 TIMES DAILY, Disp: 180 tablet, Rfl: 11   traZODone (DESYREL) 50 MG tablet, Take 50 mg by mouth at bedtime., Disp: , Rfl:    venlafaxine XR (EFFEXOR-XR) 37.5 MG 24 hr capsule, TAKE 1 CAPSULE BY MOUTH DAILY WITH BREAKFAST., Disp: 90 capsule, Rfl: 0   ALPRAZolam (XANAX) 0.25 MG tablet, Take 1 tablet (0.25 mg total) by mouth 2 (two) times daily as needed (Patient not taking: Reported on 05/24/2021), Disp: 60 tablet, Rfl: 5   ALPRAZolam (XANAX) 0.25 MG tablet, Take by mouth. (Patient not taking: Reported on 05/24/2021), Disp: , Rfl:    amLODipine (NORVASC) 5 MG tablet, TAKE 1 TABLET BY MOUTH ONCE DAILY, Disp: 30 tablet, Rfl: 5   gabapentin (NEURONTIN) 300 MG capsule, TAKE 1 CAPSULE BY MOUTH NIGHTLY, Disp: 30 capsule, Rfl: 5   hydrochlorothiazide (HYDRODIURIL) 12.5 MG tablet, TAKE 1 TABLET BY MOUTH ONCE DAILY, Disp: 30 tablet, Rfl: 11    hydrochlorothiazide (HYDRODIURIL) 12.5 MG tablet, TAKE 1 TABLET BY MOUTH ONCE DAILY, Disp: 30 tablet, Rfl: 11   traZODone (DESYREL) 50 MG tablet, TAKE 1 TABLET BY MOUTH DAILY AT NIGHT, Disp: 30 tablet, Rfl: 5   traZODone (DESYREL) 50 MG tablet, TAKE 1 TABLET BY MOUTH DAILY AT NIGHT (Patient not taking: Reported on 05/24/2021), Disp: 30 tablet, Rfl: 5  Physical exam:  Vitals:   05/24/21 1021  BP: (!) 146/90  Pulse: 69  Resp: 18  Temp: (!) 97.5 F (36.4 C)  SpO2: 100%  Weight:  122 lb 14.5 oz (55.7 kg)   Physical Exam Constitutional:      General: She is not in acute distress. Cardiovascular:     Rate and Rhythm: Normal rate and regular rhythm.     Heart sounds: Normal heart sounds.  Pulmonary:     Effort: Pulmonary effort is normal.     Breath sounds: Normal breath sounds.  Abdominal:     General: Bowel sounds are normal.     Palpations: Abdomen is soft.  Skin:    General: Skin is warm and dry.  Neurological:     Mental Status: She is alert and oriented to person, place, and time.  Chest wall exam: Patient is s/p bilateral mastectomy with reconstruction.  Well-healed surgical scar with no evidence of chest wall recurrence.  No palpable bilateral axillary adenopathy.   CMP Latest Ref Rng & Units 05/24/2021  Glucose 70 - 99 mg/dL 89  BUN 6 - 20 mg/dL 14  Creatinine 0.44 - 1.00 mg/dL 0.59  Sodium 135 - 145 mmol/L 136  Potassium 3.5 - 5.1 mmol/L 3.5  Chloride 98 - 111 mmol/L 102  CO2 22 - 32 mmol/L 28  Calcium 8.9 - 10.3 mg/dL 9.0  Total Protein 6.5 - 8.1 g/dL 7.5  Total Bilirubin 0.3 - 1.2 mg/dL 0.4  Alkaline Phos 38 - 126 U/L 82  AST 15 - 41 U/L 22  ALT 0 - 44 U/L 19   CBC Latest Ref Rng & Units 05/24/2021  WBC 4.0 - 10.5 K/uL 7.8  Hemoglobin 12.0 - 15.0 g/dL 12.1  Hematocrit 36.0 - 46.0 % 36.7  Platelets 150 - 400 K/uL 334     Assessment and plan- Patient is a 55 y.o. female with history of stage II right breast cancer ER weakly +1 to 10%, PR 11 to 50% and HER2  positive diagnosed in 2016 s/p neoadjuvant chemotherapy followed by bilateral mastectomy with reconstruction and adjuvant radiation treatment.  She is currently on letrozole and this is a routine follow-up visit  Patient is now at 5-year mark since starting hormone therapy.  She is presently struggling taking her letrozole and has significant side effects including fatigue as well as generalized body aches and sleep issues.  She also states that she was not on any blood pressure medications prior to hormone therapy and is presently taking 3 different antihypertensives.  I again went through her breast cancer pathology and explained to her that she has weakly ER positive disease 1 to 10% and therefore the benefit of letrozole is not as strong as compared to a patient with strongly estrogen positive breast cancer over 90%.  Also she achieved a pathological complete response with neoadjuvant chemotherapy which is an overall good prognostic sign.  It would therefore be reasonable to stop her letrozole at this time.  Moving forward with her surveillance she does not require any routine tumor marker testing.  No role for routine mammogram after bilateral mastectomy.  She can be continued to be monitored by her primary care doctor with routine chest wall examinations once a year.  If there are any concerning signs and symptoms in the future such as new aches and pains or unintentional weight loss, she can be referred to [place] for further work-up.  She has now remained cancer free for 5 years which is a good milestone in breast cancer.  She does not require any follow-up with me at this time and can be referred to me in the future if questions or concerns arise  Visit Diagnosis 1. Encounter for follow-up surveillance of breast cancer      Dr. Randa Evens, MD, MPH [facility] at [facility] [phone] 05/25/2021 1:25 PM

## 2021-05-29 ENCOUNTER — Other Ambulatory Visit: Payer: Self-pay | Admitting: Oncology

## 2021-05-29 ENCOUNTER — Other Ambulatory Visit: Payer: Self-pay

## 2021-05-29 MED FILL — Propranolol HCl Tab 20 MG: ORAL | 90 days supply | Qty: 180 | Fill #1 | Status: AC

## 2021-05-30 ENCOUNTER — Other Ambulatory Visit: Payer: Self-pay

## 2021-06-01 ENCOUNTER — Other Ambulatory Visit: Payer: Self-pay

## 2021-06-01 DIAGNOSIS — R7309 Other abnormal glucose: Secondary | ICD-10-CM | POA: Diagnosis not present

## 2021-06-01 DIAGNOSIS — E7800 Pure hypercholesterolemia, unspecified: Secondary | ICD-10-CM | POA: Diagnosis not present

## 2021-06-01 DIAGNOSIS — I10 Essential (primary) hypertension: Secondary | ICD-10-CM | POA: Diagnosis not present

## 2021-06-01 DIAGNOSIS — Z79899 Other long term (current) drug therapy: Secondary | ICD-10-CM | POA: Diagnosis not present

## 2021-06-05 ENCOUNTER — Other Ambulatory Visit: Payer: Self-pay

## 2021-06-06 ENCOUNTER — Other Ambulatory Visit: Payer: Self-pay

## 2021-06-06 MED ORDER — VENLAFAXINE HCL ER 75 MG PO CP24
ORAL_CAPSULE | ORAL | 3 refills | Status: AC
  Filled 2021-06-06: qty 90, 90d supply, fill #0
  Filled 2021-09-03: qty 90, 90d supply, fill #1
  Filled 2021-12-04: qty 90, 90d supply, fill #2
  Filled 2022-03-08 – 2022-03-14 (×2): qty 90, 90d supply, fill #3

## 2021-06-10 ENCOUNTER — Encounter: Payer: Self-pay | Admitting: Hematology and Oncology

## 2021-06-15 DIAGNOSIS — Z01419 Encounter for gynecological examination (general) (routine) without abnormal findings: Secondary | ICD-10-CM | POA: Diagnosis not present

## 2021-06-15 DIAGNOSIS — Z1331 Encounter for screening for depression: Secondary | ICD-10-CM | POA: Diagnosis not present

## 2021-07-09 ENCOUNTER — Other Ambulatory Visit: Payer: Self-pay

## 2021-07-10 ENCOUNTER — Other Ambulatory Visit: Payer: Self-pay

## 2021-07-10 MED ORDER — TRAZODONE HCL 50 MG PO TABS
50.0000 mg | ORAL_TABLET | Freq: Every day | ORAL | 5 refills | Status: AC
  Filled 2021-07-10 – 2022-03-01 (×3): qty 30, 30d supply, fill #0

## 2021-07-10 MED ORDER — TRAZODONE HCL 50 MG PO TABS
ORAL_TABLET | ORAL | 5 refills | Status: DC
  Filled 2021-07-10: qty 30, 30d supply, fill #0
  Filled 2021-10-10: qty 30, 30d supply, fill #1
  Filled 2021-11-15: qty 30, 30d supply, fill #2
  Filled 2021-11-16 – 2022-06-04 (×2): qty 30, 30d supply, fill #3
  Filled 2022-07-02: qty 30, 30d supply, fill #4

## 2021-07-14 ENCOUNTER — Telehealth: Payer: Self-pay

## 2021-07-14 NOTE — Telephone Encounter (Cosign Needed Addendum)
Call placed to patient today to notify her of the un-blinded results obtained from the [facility] "Remember" protocol. Message left on secure voice mail with phone number for patient to return call at her earliest convenience.  Jeral Fruit, RN 07/14/21 12:13 PM   Patient returned call today, research RN informed her that she was receiving the study drug donepezil on the clinical trial [id number]. The patient states she thought she was due to the side effects she experienced during the protocol treatment period. Research RN assured the patient that when results of the study are made public, she would also receive that information. The patient was thanked for her time and participation in this important clinical trial.  Jeral Fruit, RN 07/18/21 10:34 AM

## 2021-07-18 ENCOUNTER — Other Ambulatory Visit: Payer: Self-pay

## 2021-07-18 MED ORDER — AMLODIPINE BESYLATE 5 MG PO TABS
5.0000 mg | ORAL_TABLET | Freq: Every day | ORAL | 5 refills | Status: DC
  Filled 2021-07-18: qty 30, 30d supply, fill #0
  Filled 2021-08-25: qty 30, 30d supply, fill #1
  Filled 2021-09-26: qty 30, 30d supply, fill #2
  Filled 2021-10-25: qty 30, 30d supply, fill #3
  Filled 2021-11-15: qty 30, 30d supply, fill #4
  Filled 2021-12-31: qty 30, 30d supply, fill #5

## 2021-07-19 ENCOUNTER — Other Ambulatory Visit: Payer: Self-pay

## 2021-08-01 ENCOUNTER — Encounter: Payer: Self-pay | Admitting: Hematology and Oncology

## 2021-08-01 ENCOUNTER — Other Ambulatory Visit: Payer: Self-pay

## 2021-08-01 MED ORDER — INFLUENZA VAC SPLIT QUAD 0.5 ML IM SUSY
PREFILLED_SYRINGE | INTRAMUSCULAR | 0 refills | Status: AC
  Filled 2021-08-01: qty 0.5, 1d supply, fill #0

## 2021-08-10 ENCOUNTER — Other Ambulatory Visit: Payer: Self-pay

## 2021-08-25 ENCOUNTER — Other Ambulatory Visit: Payer: Self-pay

## 2021-08-31 ENCOUNTER — Other Ambulatory Visit: Payer: Self-pay

## 2021-08-31 MED FILL — Propranolol HCl Tab 20 MG: ORAL | 90 days supply | Qty: 180 | Fill #2 | Status: AC

## 2021-09-04 ENCOUNTER — Other Ambulatory Visit: Payer: Self-pay

## 2021-09-26 ENCOUNTER — Other Ambulatory Visit: Payer: Self-pay

## 2021-10-10 ENCOUNTER — Other Ambulatory Visit: Payer: Self-pay

## 2021-10-10 MED ORDER — ALPRAZOLAM 0.25 MG PO TABS
ORAL_TABLET | ORAL | 5 refills | Status: DC
  Filled 2021-10-10: qty 60, 30d supply, fill #0
  Filled 2022-02-26: qty 60, 30d supply, fill #1

## 2021-10-11 ENCOUNTER — Other Ambulatory Visit: Payer: Self-pay

## 2021-10-25 ENCOUNTER — Other Ambulatory Visit: Payer: Self-pay

## 2021-11-02 DIAGNOSIS — D229 Melanocytic nevi, unspecified: Secondary | ICD-10-CM | POA: Diagnosis not present

## 2021-11-02 DIAGNOSIS — C44612 Basal cell carcinoma of skin of right upper limb, including shoulder: Secondary | ICD-10-CM | POA: Diagnosis not present

## 2021-11-02 DIAGNOSIS — D485 Neoplasm of uncertain behavior of skin: Secondary | ICD-10-CM | POA: Diagnosis not present

## 2021-11-02 DIAGNOSIS — C44712 Basal cell carcinoma of skin of right lower limb, including hip: Secondary | ICD-10-CM | POA: Diagnosis not present

## 2021-11-02 DIAGNOSIS — L570 Actinic keratosis: Secondary | ICD-10-CM | POA: Diagnosis not present

## 2021-11-08 ENCOUNTER — Other Ambulatory Visit: Payer: Self-pay

## 2021-11-15 ENCOUNTER — Other Ambulatory Visit: Payer: Self-pay

## 2021-11-16 DIAGNOSIS — Z79899 Other long term (current) drug therapy: Secondary | ICD-10-CM | POA: Diagnosis not present

## 2021-11-16 DIAGNOSIS — E7800 Pure hypercholesterolemia, unspecified: Secondary | ICD-10-CM | POA: Diagnosis not present

## 2021-11-16 DIAGNOSIS — I10 Essential (primary) hypertension: Secondary | ICD-10-CM | POA: Diagnosis not present

## 2021-11-17 ENCOUNTER — Other Ambulatory Visit: Payer: Self-pay

## 2021-11-20 ENCOUNTER — Other Ambulatory Visit: Payer: Self-pay

## 2021-11-23 DIAGNOSIS — Z0000 Encounter for general adult medical examination without abnormal findings: Secondary | ICD-10-CM | POA: Diagnosis not present

## 2021-11-23 DIAGNOSIS — D649 Anemia, unspecified: Secondary | ICD-10-CM | POA: Diagnosis not present

## 2021-11-23 DIAGNOSIS — I10 Essential (primary) hypertension: Secondary | ICD-10-CM | POA: Diagnosis not present

## 2021-11-23 DIAGNOSIS — E7800 Pure hypercholesterolemia, unspecified: Secondary | ICD-10-CM | POA: Diagnosis not present

## 2021-11-23 DIAGNOSIS — R7309 Other abnormal glucose: Secondary | ICD-10-CM | POA: Diagnosis not present

## 2021-12-04 ENCOUNTER — Other Ambulatory Visit: Payer: Self-pay

## 2021-12-05 ENCOUNTER — Other Ambulatory Visit: Payer: Self-pay

## 2021-12-05 DIAGNOSIS — C4491 Basal cell carcinoma of skin, unspecified: Secondary | ICD-10-CM | POA: Diagnosis not present

## 2021-12-05 DIAGNOSIS — L988 Other specified disorders of the skin and subcutaneous tissue: Secondary | ICD-10-CM | POA: Diagnosis not present

## 2021-12-05 MED ORDER — PROPRANOLOL HCL 20 MG PO TABS
20.0000 mg | ORAL_TABLET | Freq: Two times a day (BID) | ORAL | 11 refills | Status: AC
  Filled 2021-12-05: qty 180, 90d supply, fill #0

## 2021-12-06 ENCOUNTER — Other Ambulatory Visit: Payer: Self-pay

## 2021-12-06 MED ORDER — PROPRANOLOL HCL 20 MG PO TABS
20.0000 mg | ORAL_TABLET | Freq: Two times a day (BID) | ORAL | 11 refills | Status: AC
  Filled 2021-12-06 – 2022-02-26 (×2): qty 180, 90d supply, fill #0

## 2021-12-11 ENCOUNTER — Telehealth: Payer: 59 | Admitting: Nurse Practitioner

## 2021-12-11 DIAGNOSIS — J069 Acute upper respiratory infection, unspecified: Secondary | ICD-10-CM | POA: Diagnosis not present

## 2021-12-11 NOTE — Progress Notes (Signed)
Virtual Visit Consent   Emma Atkinson, you are scheduled for a virtual visit with a [facility] provider today.     Just as with appointments in the office, your consent must be obtained to participate.  Your consent will be active for this visit and any virtual visit you may have with one of our providers in the next 365 days.     If you have a MyChart account, a copy of this consent can be sent to you electronically.  All virtual visits are billed to your insurance company just like a traditional visit in the office.    As this is a virtual visit, video technology does not allow for your provider to perform a traditional examination.  This may limit your provider's ability to fully assess your condition.  If your provider identifies any concerns that need to be evaluated in person or the need to arrange testing (such as labs, EKG, etc.), we will make arrangements to do so.     Although advances in technology are sophisticated, we cannot ensure that it will always work on either your end or our end.  If the connection with a video visit is poor, the visit may have to be switched to a telephone visit.  With either a video or telephone visit, we are not always able to ensure that we have a secure connection.     I need to obtain your verbal consent now.   Are you willing to proceed with your visit today?    BRUNILDA EBLE has provided verbal consent on 12/11/2021 for a virtual visit (video or telephone).   Apolonio Schneiders, FNP   Date: 12/11/2021 5:37 PM   Virtual Visit via Video Note   I, Apolonio Schneiders, connected with  Emma Atkinson  ([id number], Jun 11, 1966) on 12/11/21 at  5:45 PM EST by a video-enabled telemedicine application and verified that I am speaking with the correct person using two identifiers.  Location: Patient: Virtual Visit Location Patient: Home Provider: Virtual Visit Location Provider: Home Office   I discussed the limitations of evaluation and management by telemedicine and  the availability of in person appointments. The patient expressed understanding and agreed to proceed.    History of Present Illness: Emma Atkinson is a 56 y.o. who identifies as a female who was assigned female at birth, and is being seen today  with complaints of fever that has been present for the past 3 days.   She has had a fever up to 101  Today is day 3  She has a headache  A productive cough  Clear sputum Denies any nasal congestion  She did take a home COVID test the day her fever started and it was negative.   She has had COVID in the past 2 months ago  She has been vaccinated for COVID with a booster   She did have a flu vaccine this year    Problems:  Patient Active Problem List   Diagnosis Date Noted   Vasomotor symptoms due to menopause 11/07/2019   Memory changes 07/29/2019   Neuropathy 07/29/2019   Osteopenia of neck of left femur 05/06/2019   Chronic fatigue 05/01/2017   Cellulitis 02/22/2017   Hypokalemia 12/06/2016   Hot flashes due to tamoxifen 08/18/2016   Carcinoma of overlapping sites of right breast in female, estrogen receptor positive ([place]) 07/26/2016   Loss of weight 12/06/2015   Peripheral neuropathy due to chemotherapy ([place]) 12/06/2015   Skin lesion 11/29/2015  Low serum cortisol level 09/19/2015   Malignant neoplasm of central portion of right female breast ([place]) 09/04/2015   Anxiety and depression 08/31/2015   Insomnia, persistent 08/31/2015    Allergies:  Allergies  Allergen Reactions   Ceftin [Cefuroxime Axetil] Rash   Medications:  Current Outpatient Medications:    ALPRAZolam (XANAX) 0.25 MG tablet, Take 0.0625 mg by mouth 3 (three) times daily as needed for anxiety., Disp: , Rfl:    ALPRAZolam (XANAX) 0.25 MG tablet, Take by mouth. (Patient not taking: Reported on 05/24/2021), Disp: , Rfl:    ALPRAZolam (XANAX) 0.25 MG tablet, Take 1 tablet (0.25 mg total) by mouth 2 (two) times daily as needed, Disp: 60 tablet, Rfl: 5    amLODipine (NORVASC) 5 MG tablet, Take by mouth., Disp: , Rfl:    amLODipine (NORVASC) 5 MG tablet, TAKE 1 TABLET BY MOUTH ONCE DAILY, Disp: 30 tablet, Rfl: 5   BIOTIN PO, Take by mouth., Disp: , Rfl:    Calcium Carbonate-Vitamin D3 600-400 MG-UNIT TABS, Take 1 tablet by mouth daily. , Disp: , Rfl:    gabapentin (NEURONTIN) 300 MG capsule, Take by mouth., Disp: , Rfl:    gabapentin (NEURONTIN) 300 MG capsule, TAKE 1 CAPSULE BY MOUTH NIGHTLY, Disp: 30 capsule, Rfl: 5   hydrochlorothiazide (HYDRODIURIL) 12.5 MG tablet, Take 12.5 mg by mouth daily., Disp: , Rfl:    hydrochlorothiazide (HYDRODIURIL) 12.5 MG tablet, TAKE 1 TABLET BY MOUTH ONCE DAILY, Disp: 30 tablet, Rfl: 11   hydrochlorothiazide (HYDRODIURIL) 12.5 MG tablet, TAKE 1 TABLET BY MOUTH ONCE DAILY, Disp: 30 tablet, Rfl: 11   influenza vac split quadrivalent PF (FLUARIX) 0.5 ML injection, Inject into the muscle., Disp: 0.5 mL, Rfl: 0   letrozole (FEMARA) 2.5 MG tablet, TAKE 1 TABLET BY MOUTH DAILY, Disp: 90 tablet, Rfl: 3   Multiple Vitamin (MULTIVITAMIN) capsule, Take 1 capsule by mouth daily., Disp: , Rfl:    polyethylene glycol powder (GLYCOLAX/MIRALAX) powder, Take 1 Container by mouth daily., Disp: , Rfl:    propranolol (INDERAL) 20 MG tablet, Take 20 mg by mouth 2 (two) times daily., Disp: , Rfl:    propranolol (INDERAL) 20 MG tablet, TAKE 1 TABLET BY MOUTH 2 TIMES DAILY, Disp: 180 tablet, Rfl: 11   propranolol (INDERAL) 20 MG tablet, TAKE 1 TABLET BY MOUTH 2 TIMES DAILY, Disp: 180 tablet, Rfl: 11   propranolol (INDERAL) 20 MG tablet, Take 1 tablet (20 mg total) by mouth 2 (two) times daily, Disp: 180 tablet, Rfl: 11   traZODone (DESYREL) 50 MG tablet, Take 50 mg by mouth at bedtime., Disp: , Rfl:    traZODone (DESYREL) 50 MG tablet, TAKE 1 TABLET BY MOUTH DAILY AT NIGHT, Disp: 30 tablet, Rfl: 5   traZODone (DESYREL) 50 MG tablet, TAKE 1 TABLET BY MOUTH DAILY AT NIGHT (Patient not taking: Reported on 05/24/2021), Disp: 30 tablet, Rfl:  5   traZODone (DESYREL) 50 MG tablet, TAKE 1 TABLET BY MOUTH DAILY AT NIGHT, Disp: 30 tablet, Rfl: 5   traZODone (DESYREL) 50 MG tablet, Take 1 tablet (50 mg total) by mouth at bedtime, Disp: 30 tablet, Rfl: 5   venlafaxine XR (EFFEXOR-XR) 37.5 MG 24 hr capsule, TAKE 1 CAPSULE BY MOUTH DAILY WITH BREAKFAST., Disp: 90 capsule, Rfl: 0   venlafaxine XR (EFFEXOR-XR) 75 MG 24 hr capsule, Take 1 capsule (75 mg total) by mouth once daily, Disp: 90 capsule, Rfl: 3  Observations/Objective: Patient is well-developed, well-nourished in no acute distress.  Resting comfortably at home.  Head  is normocephalic, atraumatic.  No labored breathing.  Speech is clear and coherent with logical content.  Patient is alert and oriented at baseline.    Assessment and Plan: 1. Viral upper respiratory tract infection Continue Mucinex OTC  as needed for cough Push fluids Rest  Consider retaking COVID test to assure results  Follow up if congestion persists for longer than one week or with new or worsening symptoms as discussed    Follow Up Instructions: I discussed the assessment and treatment plan with the patient. The patient was provided an opportunity to ask questions and all were answered. The patient agreed with the plan and demonstrated an understanding of the instructions.  A copy of instructions were sent to the patient via MyChart unless otherwise noted below.   The patient was advised to call back or seek an in-person evaluation if the symptoms worsen or if the condition fails to improve as anticipated.  Time:  I spent 10 minutes with the patient via telehealth technology discussing the above problems/concerns.    Apolonio Schneiders, FNP

## 2022-01-01 ENCOUNTER — Other Ambulatory Visit: Payer: Self-pay

## 2022-01-28 ENCOUNTER — Other Ambulatory Visit: Payer: Self-pay

## 2022-01-29 ENCOUNTER — Other Ambulatory Visit: Payer: Self-pay

## 2022-01-29 MED ORDER — AMLODIPINE BESYLATE 5 MG PO TABS
5.0000 mg | ORAL_TABLET | Freq: Every day | ORAL | 5 refills | Status: DC
  Filled 2022-01-29: qty 30, 30d supply, fill #0
  Filled 2022-02-26: qty 30, 30d supply, fill #1
  Filled 2022-03-26: qty 30, 30d supply, fill #2
  Filled 2022-04-26: qty 30, 30d supply, fill #3
  Filled 2022-05-28: qty 30, 30d supply, fill #4
  Filled 2022-06-25: qty 30, 30d supply, fill #5

## 2022-02-05 ENCOUNTER — Other Ambulatory Visit: Payer: Self-pay

## 2022-02-06 ENCOUNTER — Other Ambulatory Visit: Payer: Self-pay

## 2022-02-06 MED ORDER — HYDROCHLOROTHIAZIDE 12.5 MG PO TABS
12.5000 mg | ORAL_TABLET | Freq: Every day | ORAL | 11 refills | Status: AC
  Filled 2022-02-06: qty 30, 30d supply, fill #0
  Filled 2022-02-26: qty 30, 30d supply, fill #1
  Filled 2022-04-06: qty 30, 30d supply, fill #2
  Filled 2022-05-08: qty 30, 30d supply, fill #3
  Filled 2022-05-28 – 2022-06-02 (×3): qty 30, 30d supply, fill #4
  Filled 2022-07-02: qty 30, 30d supply, fill #5
  Filled 2022-07-23 – 2022-07-30 (×2): qty 30, 30d supply, fill #6
  Filled 2022-09-03: qty 30, 30d supply, fill #7
  Filled 2022-10-03: qty 30, 30d supply, fill #8
  Filled 2022-10-24 – 2022-10-27 (×2): qty 30, 30d supply, fill #9
  Filled 2022-11-23: qty 30, 30d supply, fill #10

## 2022-02-26 ENCOUNTER — Other Ambulatory Visit: Payer: Self-pay

## 2022-02-27 ENCOUNTER — Other Ambulatory Visit: Payer: Self-pay

## 2022-02-27 MED ORDER — PROPRANOLOL HCL 20 MG PO TABS
20.0000 mg | ORAL_TABLET | Freq: Two times a day (BID) | ORAL | 3 refills | Status: DC
  Filled 2022-02-27 – 2022-06-13 (×2): qty 180, 90d supply, fill #0
  Filled 2022-09-17: qty 180, 90d supply, fill #1

## 2022-03-01 ENCOUNTER — Other Ambulatory Visit: Payer: Self-pay

## 2022-03-05 ENCOUNTER — Other Ambulatory Visit: Payer: Self-pay

## 2022-03-08 ENCOUNTER — Other Ambulatory Visit (HOSPITAL_COMMUNITY): Payer: Self-pay

## 2022-03-14 ENCOUNTER — Other Ambulatory Visit (HOSPITAL_COMMUNITY): Payer: Self-pay

## 2022-03-14 ENCOUNTER — Other Ambulatory Visit: Payer: Self-pay

## 2022-03-26 ENCOUNTER — Other Ambulatory Visit (HOSPITAL_COMMUNITY): Payer: Self-pay

## 2022-04-06 ENCOUNTER — Other Ambulatory Visit (HOSPITAL_COMMUNITY): Payer: Self-pay

## 2022-04-26 ENCOUNTER — Other Ambulatory Visit (HOSPITAL_COMMUNITY): Payer: Self-pay

## 2022-05-08 ENCOUNTER — Other Ambulatory Visit (HOSPITAL_COMMUNITY): Payer: Self-pay

## 2022-05-23 ENCOUNTER — Other Ambulatory Visit: Payer: Self-pay

## 2022-05-23 DIAGNOSIS — Z79899 Other long term (current) drug therapy: Secondary | ICD-10-CM | POA: Diagnosis not present

## 2022-05-23 DIAGNOSIS — R7309 Other abnormal glucose: Secondary | ICD-10-CM | POA: Diagnosis not present

## 2022-05-23 DIAGNOSIS — F419 Anxiety disorder, unspecified: Secondary | ICD-10-CM | POA: Diagnosis not present

## 2022-05-23 DIAGNOSIS — F32A Depression, unspecified: Secondary | ICD-10-CM | POA: Diagnosis not present

## 2022-05-23 DIAGNOSIS — I10 Essential (primary) hypertension: Secondary | ICD-10-CM | POA: Diagnosis not present

## 2022-05-23 DIAGNOSIS — E7800 Pure hypercholesterolemia, unspecified: Secondary | ICD-10-CM | POA: Diagnosis not present

## 2022-05-23 MED ORDER — SERTRALINE HCL 50 MG PO TABS
ORAL_TABLET | ORAL | 11 refills | Status: DC
  Filled 2022-05-23: qty 30, 30d supply, fill #0
  Filled 2022-06-13: qty 30, 30d supply, fill #1
  Filled 2022-07-02 – 2022-08-13 (×2): qty 30, 30d supply, fill #2
  Filled 2022-09-06: qty 30, 30d supply, fill #3
  Filled 2022-11-14: qty 30, 30d supply, fill #4
  Filled 2023-01-14: qty 30, 30d supply, fill #5
  Filled 2023-03-21: qty 30, 30d supply, fill #6

## 2022-05-28 ENCOUNTER — Other Ambulatory Visit (HOSPITAL_COMMUNITY): Payer: Self-pay

## 2022-05-31 ENCOUNTER — Other Ambulatory Visit (HOSPITAL_COMMUNITY): Payer: Self-pay

## 2022-06-01 ENCOUNTER — Other Ambulatory Visit (HOSPITAL_COMMUNITY): Payer: Self-pay

## 2022-06-01 ENCOUNTER — Other Ambulatory Visit: Payer: Self-pay

## 2022-06-01 MED ORDER — ONDANSETRON HCL 4 MG PO TABS
ORAL_TABLET | ORAL | 5 refills | Status: DC
Start: 2022-06-01 — End: 2023-08-21
  Filled 2022-06-01: qty 30, 10d supply, fill #0
  Filled 2022-07-02: qty 30, 10d supply, fill #1
  Filled 2022-08-13: qty 30, 10d supply, fill #2
  Filled 2022-10-03: qty 30, 10d supply, fill #3
  Filled 2023-03-21: qty 30, 10d supply, fill #4
  Filled 2023-05-01: qty 30, 10d supply, fill #5

## 2022-06-02 ENCOUNTER — Other Ambulatory Visit (HOSPITAL_COMMUNITY): Payer: Self-pay

## 2022-06-05 ENCOUNTER — Other Ambulatory Visit (HOSPITAL_COMMUNITY): Payer: Self-pay

## 2022-06-07 ENCOUNTER — Other Ambulatory Visit (HOSPITAL_COMMUNITY): Payer: Self-pay

## 2022-06-13 ENCOUNTER — Other Ambulatory Visit (HOSPITAL_COMMUNITY): Payer: Self-pay

## 2022-06-16 ENCOUNTER — Other Ambulatory Visit (HOSPITAL_COMMUNITY): Payer: Self-pay

## 2022-06-25 ENCOUNTER — Other Ambulatory Visit (HOSPITAL_COMMUNITY): Payer: Self-pay

## 2022-07-02 ENCOUNTER — Other Ambulatory Visit (HOSPITAL_COMMUNITY): Payer: Self-pay

## 2022-07-23 ENCOUNTER — Other Ambulatory Visit (HOSPITAL_COMMUNITY): Payer: Self-pay

## 2022-07-24 ENCOUNTER — Other Ambulatory Visit (HOSPITAL_COMMUNITY): Payer: Self-pay

## 2022-07-24 MED ORDER — AMLODIPINE BESYLATE 5 MG PO TABS
5.0000 mg | ORAL_TABLET | Freq: Every day | ORAL | 5 refills | Status: DC
  Filled 2022-07-24: qty 30, 30d supply, fill #0
  Filled 2022-08-26: qty 30, 30d supply, fill #1
  Filled 2022-09-17 – 2022-09-20 (×2): qty 30, 30d supply, fill #2
  Filled 2022-10-24: qty 30, 30d supply, fill #3
  Filled 2022-11-23: qty 30, 30d supply, fill #4
  Filled 2022-12-27: qty 30, 30d supply, fill #5

## 2022-07-30 ENCOUNTER — Other Ambulatory Visit (HOSPITAL_COMMUNITY): Payer: Self-pay

## 2022-08-07 DIAGNOSIS — L578 Other skin changes due to chronic exposure to nonionizing radiation: Secondary | ICD-10-CM | POA: Diagnosis not present

## 2022-08-07 DIAGNOSIS — Z85828 Personal history of other malignant neoplasm of skin: Secondary | ICD-10-CM | POA: Diagnosis not present

## 2022-08-07 DIAGNOSIS — Z872 Personal history of diseases of the skin and subcutaneous tissue: Secondary | ICD-10-CM | POA: Diagnosis not present

## 2022-08-07 DIAGNOSIS — L570 Actinic keratosis: Secondary | ICD-10-CM | POA: Diagnosis not present

## 2022-08-14 ENCOUNTER — Other Ambulatory Visit (HOSPITAL_COMMUNITY): Payer: Self-pay

## 2022-08-16 ENCOUNTER — Other Ambulatory Visit: Payer: Self-pay

## 2022-08-16 DIAGNOSIS — J40 Bronchitis, not specified as acute or chronic: Secondary | ICD-10-CM | POA: Diagnosis not present

## 2022-08-16 MED ORDER — AMOXICILLIN 875 MG PO TABS
ORAL_TABLET | ORAL | 0 refills | Status: AC
  Filled 2022-08-16: qty 14, 7d supply, fill #0

## 2022-08-16 MED ORDER — PREDNISONE 10 MG PO TABS
ORAL_TABLET | ORAL | 0 refills | Status: DC
  Filled 2022-08-16: qty 12, 8d supply, fill #0

## 2022-08-23 ENCOUNTER — Other Ambulatory Visit: Payer: Self-pay

## 2022-08-23 DIAGNOSIS — E7800 Pure hypercholesterolemia, unspecified: Secondary | ICD-10-CM | POA: Diagnosis not present

## 2022-08-23 DIAGNOSIS — I10 Essential (primary) hypertension: Secondary | ICD-10-CM | POA: Diagnosis not present

## 2022-08-23 DIAGNOSIS — F419 Anxiety disorder, unspecified: Secondary | ICD-10-CM | POA: Diagnosis not present

## 2022-08-23 DIAGNOSIS — R7309 Other abnormal glucose: Secondary | ICD-10-CM | POA: Diagnosis not present

## 2022-08-23 DIAGNOSIS — Z79899 Other long term (current) drug therapy: Secondary | ICD-10-CM | POA: Diagnosis not present

## 2022-08-23 DIAGNOSIS — F32A Depression, unspecified: Secondary | ICD-10-CM | POA: Diagnosis not present

## 2022-08-23 MED ORDER — REXULTI 1 MG PO TABS
1.0000 mg | ORAL_TABLET | Freq: Every day | ORAL | 3 refills | Status: AC
  Filled 2022-08-23: qty 30, 30d supply, fill #0

## 2022-08-27 ENCOUNTER — Other Ambulatory Visit (HOSPITAL_COMMUNITY): Payer: Self-pay

## 2022-09-03 ENCOUNTER — Other Ambulatory Visit (HOSPITAL_COMMUNITY): Payer: Self-pay

## 2022-09-06 ENCOUNTER — Other Ambulatory Visit (HOSPITAL_COMMUNITY): Payer: Self-pay

## 2022-09-07 ENCOUNTER — Other Ambulatory Visit (HOSPITAL_COMMUNITY): Payer: Self-pay

## 2022-09-17 ENCOUNTER — Other Ambulatory Visit (HOSPITAL_COMMUNITY): Payer: Self-pay

## 2022-09-20 ENCOUNTER — Other Ambulatory Visit (HOSPITAL_COMMUNITY): Payer: Self-pay

## 2022-09-21 ENCOUNTER — Other Ambulatory Visit: Payer: Self-pay

## 2022-09-21 DIAGNOSIS — I10 Essential (primary) hypertension: Secondary | ICD-10-CM | POA: Diagnosis not present

## 2022-09-21 DIAGNOSIS — J40 Bronchitis, not specified as acute or chronic: Secondary | ICD-10-CM | POA: Diagnosis not present

## 2022-09-21 MED ORDER — AZITHROMYCIN 250 MG PO TABS
ORAL_TABLET | ORAL | 0 refills | Status: AC
  Filled 2022-09-21: qty 6, 5d supply, fill #0

## 2022-09-21 MED ORDER — PREDNISONE 10 MG PO TABS
ORAL_TABLET | ORAL | 0 refills | Status: AC
  Filled 2022-09-21: qty 20, 8d supply, fill #0

## 2022-09-27 ENCOUNTER — Encounter: Payer: Self-pay | Admitting: Hematology and Oncology

## 2022-09-29 ENCOUNTER — Encounter: Payer: Self-pay | Admitting: Hematology and Oncology

## 2022-10-03 ENCOUNTER — Other Ambulatory Visit: Payer: Self-pay

## 2022-10-03 ENCOUNTER — Other Ambulatory Visit (HOSPITAL_COMMUNITY): Payer: Self-pay

## 2022-10-03 MED ORDER — TRAZODONE HCL 50 MG PO TABS
50.0000 mg | ORAL_TABLET | Freq: Every evening | ORAL | 5 refills | Status: AC
  Filled 2022-10-03: qty 30, 30d supply, fill #0

## 2022-10-11 ENCOUNTER — Other Ambulatory Visit (HOSPITAL_COMMUNITY): Payer: Self-pay

## 2022-10-12 ENCOUNTER — Other Ambulatory Visit: Payer: Self-pay

## 2022-10-12 ENCOUNTER — Other Ambulatory Visit (HOSPITAL_COMMUNITY): Payer: Self-pay

## 2022-10-12 MED ORDER — ALPRAZOLAM 0.25 MG PO TABS
0.2500 mg | ORAL_TABLET | Freq: Two times a day (BID) | ORAL | 5 refills | Status: DC | PRN
  Filled 2022-10-12: qty 60, 30d supply, fill #0
  Filled 2022-11-23: qty 60, 30d supply, fill #1
  Filled 2023-01-14: qty 60, 30d supply, fill #2
  Filled 2023-03-22: qty 60, 30d supply, fill #3

## 2022-10-24 ENCOUNTER — Other Ambulatory Visit (HOSPITAL_COMMUNITY): Payer: Self-pay

## 2022-10-24 ENCOUNTER — Other Ambulatory Visit: Payer: Self-pay

## 2022-10-26 ENCOUNTER — Other Ambulatory Visit: Payer: Self-pay

## 2022-10-26 ENCOUNTER — Ambulatory Visit
Admission: EM | Admit: 2022-10-26 | Discharge: 2022-10-26 | Disposition: A | Discharge status: Home / Self Care | Payer: Commercial Managed Care - PPO | Attending: Family Medicine | Admitting: Family Medicine

## 2022-10-26 ENCOUNTER — Encounter: Payer: Self-pay | Admitting: Emergency Medicine

## 2022-10-26 DIAGNOSIS — I10 Essential (primary) hypertension: Secondary | ICD-10-CM | POA: Insufficient documentation

## 2022-10-26 DIAGNOSIS — R519 Headache, unspecified: Secondary | ICD-10-CM | POA: Insufficient documentation

## 2022-10-26 LAB — BASIC METABOLIC PANEL
Anion gap: 12 (ref 5–15)
BUN: 16 mg/dL (ref 6–20)
CO2: 22 mmol/L (ref 22–32)
Calcium: 9.5 mg/dL (ref 8.9–10.3)
Chloride: 102 mmol/L (ref 98–111)
Creatinine, Ser: 0.61 mg/dL (ref 0.44–1.00)
GFR, Estimated: >60 mL/min (ref >60)
Glucose, Bld: 91 mg/dL (ref 70–99)
Potassium: 3.7 mmol/L (ref 3.5–5.1)
Sodium: 136 mmol/L (ref 135–145)

## 2022-10-26 LAB — CBC WITH DIFFERENTIAL/PLATELET
Abs Immature Granulocytes: 0.03 10*3/uL (ref 0.00–0.07)
Basophils Absolute: 0 10*3/uL (ref 0.0–0.1)
Basophils Relative: 0 %
Eosinophils Absolute: 0.1 10*3/uL (ref 0.0–0.5)
Eosinophils Relative: 1 %
HCT: 39.7 % (ref 36.0–46.0)
Hemoglobin: 13.3 g/dL (ref 12.0–15.0)
Immature Granulocytes: 0 %
Lymphocytes Relative: 25 %
Lymphs Abs: 2 10*3/uL (ref 0.7–4.0)
MCH: 30.6 pg (ref 26.0–34.0)
MCHC: 33.5 g/dL (ref 30.0–36.0)
MCV: 91.5 fL (ref 80.0–100.0)
Monocytes Absolute: 0.4 10*3/uL (ref 0.1–1.0)
Monocytes Relative: 5 %
Neutro Abs: 5.4 10*3/uL (ref 1.7–7.7)
Neutrophils Relative %: 69 %
Platelets: 311 10*3/uL (ref 150–400)
RBC: 4.34 MIL/uL (ref 3.87–5.11)
RDW: 13.7 % (ref 11.5–15.5)
WBC: 8 10*3/uL (ref 4.0–10.5)
nRBC: 0 % (ref 0.0–0.2)

## 2022-10-26 MED ORDER — PROCHLORPERAZINE MALEATE 10 MG PO TABS
10.0000 mg | ORAL_TABLET | Freq: Two times a day (BID) | ORAL | 0 refills | Status: AC | PRN
  Filled 2022-10-26: qty 10, 5d supply, fill #0

## 2022-10-26 NOTE — ED Provider Notes (Signed)
[facility]    CSN: [id number] Arrival date & time: 10/26/22  1011      History   Chief Complaint Chief Complaint  Patient presents with   Hypertension    HPI TAYLYNN EASTON is a 57 y.o. female.   HPI   Amber presents for higher blood pressure. Home 141/95 today.  She called her primary care doctor who couldn't see her today.  Has had some swelling in her fingers.  No chest pain or shortness of breath.   Takes amlodipine, HCTZ and propanol for blood pressures.  She has not missed any doses. 0   Fever : no  Chills: no Sore throat: no   Cough: no Sputum: no Nasal congestion : no  Rhinorrhea: no Myalgias: no Appetite: normal  Hydration: normal  Abdominal pain: no Nausea: no Vomiting: no Diarrhea: No Rash: No Sleep disturbance: no Headache: no      Past Medical History:  Diagnosis Date   Allergy    Anemia    H/O   Anxiety    Blood transfusion without reported diagnosis    Breast cancer ([place])    breast   Chronic kidney disease 12-2015   PYLONEPHRITIS   Complication of anesthesia    HARD TO WAKE UP AFTER TONSILLECTOMY AS A CHILD BUT NO PROBLEMS SINCE   Complication of anesthesia    PT STATES THAT BACK IN OR DURING PORT PLACMENT IN 2016 THAT SHE BECAME VEERY FLUSHED, LIGHT HEADED AND CRNA SAID ANTIBIOTIC MAY BE GOING IN IN TO FAST-RATE DECREASED AND PTS SYMPTOMS RESOLVED IMMEDIATELY   Depression    GERD (gastroesophageal reflux disease)    Hypertension    PONV (postoperative nausea and vomiting)    Shortness of breath dyspnea     Patient Active Problem List   Diagnosis Date Noted   Vasomotor symptoms due to menopause 11/07/2019   Memory changes 07/29/2019   Neuropathy 07/29/2019   Osteopenia of neck of left femur 05/06/2019   Chronic fatigue 05/01/2017   Cellulitis 02/22/2017   Hypokalemia 12/06/2016   Hot flashes due to tamoxifen 08/18/2016   Carcinoma of overlapping sites of right breast in female, estrogen receptor positive ([place])  07/26/2016   Loss of weight 12/06/2015   Peripheral neuropathy due to chemotherapy ([place]) 12/06/2015   Skin lesion 11/29/2015   Low serum cortisol level 09/19/2015   Malignant neoplasm of central portion of right female breast ([place]) 09/04/2015   Anxiety and depression 08/31/2015   Insomnia, persistent 08/31/2015    Past Surgical History:  Procedure Laterality Date   ABDOMINAL HYSTERECTOMY  2001   BACK SURGERY     FOR SCOLIOSIS-HERRINGTON RODS IN PLACE   BREAST BIOPSY Bilateral    08/17/2015    BREAST RECONSTRUCTION Bilateral    BREAST RECONSTRUCTION WITH PLACEMENT OF TISSUE EXPANDER AND FLEX HD (ACELLULAR HYDRATED DERMIS) Right 11/14/2020   Procedure: REVISION OF BILATERAL BREAST RECONSTRUCTION, RIGHT CAPSULORRHAPHY, PLACEMENT OF FLEX HD TO RIGHT BREAST, EXCHANGE OF BILATERAL IMPLANTS.;  Surgeon: Cindra Presume, MD;  Location: [facility];  Service: Plastics;  Laterality: Right;   PORTACATH PLACEMENT N/A 08/30/2015   Procedure: INSERTION PORT-A-CATH;  Surgeon: Leonie Green, MD;  Location: [facility];  Service: General;  Laterality: N/A;   SENTINEL NODE BIOPSY Bilateral 03/08/2016   Procedure: SENTINEL NODE BIOPSY;  Surgeon: Leonie Green, MD;  Location: [facility];  Service: General;  Laterality: Bilateral;   SIMPLE MASTECTOMY WITH AXILLARY SENTINEL NODE BIOPSY Bilateral 03/08/2016   Procedure: SIMPLE MASTECTOMY;  Surgeon: Leonie Green, MD;  Location: [facility];  Service: General;  Laterality: Bilateral;   TONSILLECTOMY      OB History   No obstetric history on file.      Home Medications    Prior to Admission medications   Medication Sig Start Date End Date Taking? Authorizing Provider  amLODipine (NORVASC) 5 MG tablet Take 1 tablet (5 mg total) by mouth daily. 07/23/22  Yes   hydrochlorothiazide (HYDRODIURIL) 12.5 MG tablet Take 1 tablet (12.5 mg total) by mouth daily. 02/05/22  Yes   Multiple Vitamin (MULTIVITAMIN) capsule Take 1 capsule by mouth  daily.   Yes [provider]  propranolol (INDERAL) 20 MG tablet TAKE 1 TABLET BY MOUTH 2 TIMES DAILY 12/04/21  Yes   sertraline (ZOLOFT) 50 MG tablet Take one tablet by mouth at bedtime 05/23/22  Yes   traZODone (DESYREL) 50 MG tablet Take 50 mg by mouth at bedtime.   Yes [provider]  ALPRAZolam (XANAX) 0.25 MG tablet Take 0.0625 mg by mouth 3 (three) times daily as needed for anxiety.    [provider]  ALPRAZolam Duanne Moron) 0.25 MG tablet Take by mouth. Patient not taking: Reported on 05/24/2021 03/29/21   [provider]  ALPRAZolam Duanne Moron) 0.25 MG tablet Take 1 tablet (0.25 mg total) by mouth 2 (two) times daily as needed. 10/12/22     amLODipine (NORVASC) 5 MG tablet Take by mouth. 08/03/20 08/03/21  [provider]  amoxicillin (AMOXIL) 875 MG tablet Take 1 tablet (875 mg total) by mouth every 12 (twelve) hours for 7 days 08/16/22     azithromycin (ZITHROMAX) 250 MG tablet Take 2 tablets ('500mg'$ ) by mouth on Day 1, then 1 tablet daily on Days 2-5. 09/21/22     BIOTIN PO Take by mouth.    [provider]  brexpiprazole (REXULTI) 1 MG TABS tablet Take 1 tablet (1 mg total) by mouth daily. 08/23/22     Calcium Carbonate-Vitamin D3 600-400 MG-UNIT TABS Take 1 tablet by mouth daily.     [provider]  gabapentin (NEURONTIN) 300 MG capsule Take by mouth. 08/03/20 08/03/21  [provider]  gabapentin (NEURONTIN) 300 MG capsule TAKE 1 CAPSULE BY MOUTH NIGHTLY 03/29/21     hydrochlorothiazide (HYDRODIURIL) 12.5 MG tablet Take 12.5 mg by mouth daily. 11/13/19   [provider]  hydrochlorothiazide (HYDRODIURIL) 12.5 MG tablet TAKE 1 TABLET BY MOUTH ONCE DAILY 11/23/19 11/22/20  Idelle Crouch, MD  influenza vac split quadrivalent PF (FLUARIX) 0.5 ML injection Inject into the muscle. 08/01/21     ondansetron (ZOFRAN) 4 MG tablet Take 1 tablet (4 mg total) by mouth every 8 (eight) hours as needed for Nausea for up to 7 days  06/01/22     polyethylene glycol powder (GLYCOLAX/MIRALAX) powder Take 1 Container by mouth daily.    [provider]  predniSONE (DELTASONE) 10 MG tablet Take 4 tablets by mouth daily for 2 days, 3 tablets daily for 2 days, 2 tablets daily for  2 days, then 1 tablet daily for 2 days 09/21/22     propranolol (INDERAL) 20 MG tablet Take 20 mg by mouth 2 (two) times daily. 11/23/19   [provider]  propranolol (INDERAL) 20 MG tablet TAKE 1 TABLET BY MOUTH 2 TIMES DAILY 11/30/20 11/30/21  Idelle Crouch, MD  propranolol (INDERAL) 20 MG tablet Take 1 tablet (20 mg total) by mouth 2 (two) times daily 12/05/21     propranolol (INDERAL) 20 MG  tablet Take 1 tablet (20 mg total) by mouth 2 (two) times daily 02/27/22     traZODone (DESYREL) 50 MG tablet TAKE 1 TABLET BY MOUTH DAILY AT NIGHT 02/02/20 02/18/21  Idelle Crouch, MD  traZODone (DESYREL) 50 MG tablet TAKE 1 TABLET BY MOUTH DAILY AT NIGHT Patient not taking: Reported on 05/24/2021 02/27/21     traZODone (DESYREL) 50 MG tablet Take 1 tablet (50 mg total) by mouth at bedtime 07/10/21     traZODone (DESYREL) 50 MG tablet Take 1 tablet (50 mg total) by mouth Nightly. 10/03/22     venlafaxine XR (EFFEXOR-XR) 37.5 MG 24 hr capsule TAKE 1 CAPSULE BY MOUTH DAILY WITH BREAKFAST. 03/09/21 03/09/22  Jacquelin Hawking, NP  venlafaxine XR (EFFEXOR-XR) 75 MG 24 hr capsule Take 1 capsule (75 mg total) by mouth once daily 06/06/21       Family History Family History  Problem Relation Age of Onset   Breast cancer Maternal Aunt 60   Cancer Father    Cancer Sister    Cancer Maternal Grandfather    Colon cancer Neg Hx    Esophageal cancer Neg Hx    Rectal cancer Neg Hx    Stomach cancer Neg Hx     Social History Social History   Tobacco Use   Smoking status: Former    Packs/day: 1.00    Years: 10.00    Total pack years: 10.00    Types: Cigarettes    Quit date: 10/16/1999    Years since quitting: 23.0   Smokeless tobacco: Never  Vaping Use    Vaping Use: Never used  Substance Use Topics   Alcohol use: Yes    Comment: daily   Drug use: No     Allergies   Ceftin [cefuroxime axetil]   Review of Systems Review of Systems: negative unless otherwise stated in HPI.      Physical Exam Triage Vital Signs ED Triage Vitals  Enc Vitals Group     BP 10/26/22 1105 (!) 151/89     Pulse Rate 10/26/22 1105 98     Resp 10/26/22 1105 14     Temp 10/26/22 1105 98.2 F (36.8 C)     Temp Source 10/26/22 1105 Oral     SpO2 10/26/22 1105 100 %     Weight 10/26/22 1102 119 lb (54 kg)     Height 10/26/22 1102 5' (1.524 m)     Head Circumference --      Peak Flow --      Pain Score 10/26/22 1102 3     Pain Loc --      Pain Edu? --      Excl. in [place]? --    No data found.  Updated Vital Signs BP (!) 151/89 (BP Location: Left Arm)   Pulse 98   Temp 98.2 F (36.8 C) (Oral)   Resp 14   Ht 5' (1.524 m)   Wt 54 kg   SpO2 100%   BMI 23.24 kg/m   Visual Acuity Right Eye Distance:   Left Eye Distance:   Bilateral Distance:    Right Eye Near:   Left Eye Near:    Bilateral Near:     Physical Exam GEN:     alert, non-toxic appearing female in no distress ***   HENT:  mucus membranes moist, oropharyngeal ***without lesions or ***exudate, no*** tonsillar hypertrophy, *** mild oropharyngeal erythema , *** moderate erythematous edematous turbinates, ***clear nasal discharge, ***bilateral TM normal EYES:  pupils equal and reactive, ***no scleral injection or discharge NECK:  normal ROM, no ***lymphadenopathy, ***no meningismus   RESP:  no increased work of breathing, ***clear to auscultation bilaterally CVS:   regular rate ***and rhythm Skin:   warm and dry, no rash on visible skin***    UC Treatments / Results  Labs (all labs ordered are listed, but only abnormal results are displayed) Labs Reviewed - No data to display  EKG   Radiology No results found.  Procedures Procedures (including critical care  time)  Medications Ordered in UC Medications - No data to display  Initial Impression / Assessment and Plan / UC Course  I have reviewed the triage vital signs and the nursing notes.  Pertinent labs & imaging results that were available during my care of the patient were reviewed by me and considered in my medical decision making (see chart for details).       Pt is a 57 y.o. female who presents for *** days of respiratory symptoms. [place] is ***afebrile here without recent antipyretics. Satting well on room air. Overall pt is ***non-toxic appearing, well hydrated, without respiratory distress. Pulmonary exam ***is unremarkable.  COVID and influenza testing obtained ***and was negative. ***Pt to quarantine until COVID test results or longer if positive.  I will call patient with test results, if positive. History consistent with ***viral respiratory illness. Discussed symptomatic treatment.  Explained lack of efficacy of antibiotics in viral disease.  Typical duration of symptoms discussed.   Return and ED precautions given and voiced understanding. Discussed MDM, treatment plan and plan for follow-up with patient/guardian*** who agrees with plan.     Final Clinical Impressions(s) / UC Diagnoses   Final diagnoses:  None   Discharge Instructions   None    ED Prescriptions   None    PDMP not reviewed this encounter.

## 2022-10-26 NOTE — ED Triage Notes (Signed)
Patient reports history of Hypertension.  Patient states that for the past 2 days has had headache and blurry vision.  Patient states that she took her blood pressure and was elevated this morning at 145/95.  Patient took her BP medicine this medicine.

## 2022-10-26 NOTE — Discharge Instructions (Addendum)
Your blood work did not show cause of your elevated blood pressures.  I doubt that your blood pressure is causing acute damage to your organs at this time.  As discussed, go to the emergency department if you start to have chest pain or shortness of breath or headache that gets worse.  Be sure to continue to log your blood pressures and follow-up with your primary care provider in the next week or 2 to discuss medication changes.

## 2022-10-29 DIAGNOSIS — C44612 Basal cell carcinoma of skin of right upper limb, including shoulder: Secondary | ICD-10-CM | POA: Diagnosis not present

## 2022-10-29 DIAGNOSIS — C4491 Basal cell carcinoma of skin, unspecified: Secondary | ICD-10-CM | POA: Diagnosis not present

## 2022-11-14 ENCOUNTER — Other Ambulatory Visit: Payer: Self-pay

## 2022-11-21 DIAGNOSIS — E7800 Pure hypercholesterolemia, unspecified: Secondary | ICD-10-CM | POA: Diagnosis not present

## 2022-11-21 DIAGNOSIS — I10 Essential (primary) hypertension: Secondary | ICD-10-CM | POA: Diagnosis not present

## 2022-11-21 DIAGNOSIS — R7309 Other abnormal glucose: Secondary | ICD-10-CM | POA: Diagnosis not present

## 2022-11-21 DIAGNOSIS — Z79899 Other long term (current) drug therapy: Secondary | ICD-10-CM | POA: Diagnosis not present

## 2022-11-23 ENCOUNTER — Other Ambulatory Visit (HOSPITAL_COMMUNITY): Payer: Self-pay

## 2022-11-26 ENCOUNTER — Other Ambulatory Visit: Payer: Self-pay

## 2022-11-28 ENCOUNTER — Encounter: Payer: Self-pay | Admitting: Hematology and Oncology

## 2022-11-28 ENCOUNTER — Other Ambulatory Visit: Payer: Self-pay

## 2022-11-28 DIAGNOSIS — E7800 Pure hypercholesterolemia, unspecified: Secondary | ICD-10-CM | POA: Diagnosis not present

## 2022-11-28 DIAGNOSIS — F419 Anxiety disorder, unspecified: Secondary | ICD-10-CM | POA: Diagnosis not present

## 2022-11-28 DIAGNOSIS — R7309 Other abnormal glucose: Secondary | ICD-10-CM | POA: Diagnosis not present

## 2022-11-28 DIAGNOSIS — F32A Depression, unspecified: Secondary | ICD-10-CM | POA: Diagnosis not present

## 2022-11-28 DIAGNOSIS — Z0000 Encounter for general adult medical examination without abnormal findings: Secondary | ICD-10-CM | POA: Diagnosis not present

## 2022-11-28 DIAGNOSIS — I10 Essential (primary) hypertension: Secondary | ICD-10-CM | POA: Diagnosis not present

## 2022-11-28 DIAGNOSIS — Z13820 Encounter for screening for osteoporosis: Secondary | ICD-10-CM | POA: Diagnosis not present

## 2022-11-28 DIAGNOSIS — Z9013 Acquired absence of bilateral breasts and nipples: Secondary | ICD-10-CM | POA: Diagnosis not present

## 2022-11-28 MED ORDER — PROPRANOLOL HCL ER 60 MG PO CP24
60.0000 mg | ORAL_CAPSULE | Freq: Every day | ORAL | 3 refills | Status: AC
  Filled 2022-11-28: qty 90, 90d supply, fill #0
  Filled 2023-02-20 (×2): qty 90, 90d supply, fill #1
  Filled 2023-05-24: qty 90, 90d supply, fill #2

## 2022-11-28 MED ORDER — HYDROCHLOROTHIAZIDE 12.5 MG PO TABS
12.5000 mg | ORAL_TABLET | Freq: Every day | ORAL | 3 refills | Status: DC
  Filled 2022-11-28 – 2022-12-27 (×2): qty 90, 90d supply, fill #0
  Filled 2023-04-03: qty 90, 90d supply, fill #1
  Filled 2023-07-30: qty 90, 90d supply, fill #2
  Filled 2023-10-28: qty 90, 90d supply, fill #3

## 2022-12-12 DIAGNOSIS — Z01419 Encounter for gynecological examination (general) (routine) without abnormal findings: Secondary | ICD-10-CM | POA: Diagnosis not present

## 2022-12-12 DIAGNOSIS — Z1331 Encounter for screening for depression: Secondary | ICD-10-CM | POA: Diagnosis not present

## 2022-12-27 ENCOUNTER — Other Ambulatory Visit (HOSPITAL_COMMUNITY): Payer: Self-pay

## 2022-12-27 ENCOUNTER — Other Ambulatory Visit: Payer: Self-pay

## 2022-12-31 DIAGNOSIS — M8588 Other specified disorders of bone density and structure, other site: Secondary | ICD-10-CM | POA: Diagnosis not present

## 2023-01-14 ENCOUNTER — Other Ambulatory Visit (HOSPITAL_COMMUNITY): Payer: Self-pay

## 2023-01-28 ENCOUNTER — Other Ambulatory Visit (HOSPITAL_COMMUNITY): Payer: Self-pay

## 2023-01-29 ENCOUNTER — Other Ambulatory Visit (HOSPITAL_COMMUNITY): Payer: Self-pay

## 2023-01-29 ENCOUNTER — Other Ambulatory Visit: Payer: Self-pay

## 2023-01-29 MED ORDER — AMLODIPINE BESYLATE 5 MG PO TABS
5.0000 mg | ORAL_TABLET | Freq: Every day | ORAL | 5 refills | Status: DC
  Filled 2023-01-29: qty 90, 90d supply, fill #0
  Filled 2023-05-01: qty 90, 90d supply, fill #1

## 2023-02-06 ENCOUNTER — Other Ambulatory Visit: Payer: Self-pay

## 2023-02-06 DIAGNOSIS — L910 Hypertrophic scar: Secondary | ICD-10-CM | POA: Diagnosis not present

## 2023-02-06 DIAGNOSIS — L570 Actinic keratosis: Secondary | ICD-10-CM | POA: Diagnosis not present

## 2023-02-06 DIAGNOSIS — Z85828 Personal history of other malignant neoplasm of skin: Secondary | ICD-10-CM | POA: Diagnosis not present

## 2023-02-06 DIAGNOSIS — L578 Other skin changes due to chronic exposure to nonionizing radiation: Secondary | ICD-10-CM | POA: Diagnosis not present

## 2023-02-06 DIAGNOSIS — Z872 Personal history of diseases of the skin and subcutaneous tissue: Secondary | ICD-10-CM | POA: Diagnosis not present

## 2023-02-06 MED ORDER — CLOBETASOL PROPIONATE 0.05 % EX OINT
1.0000 | TOPICAL_OINTMENT | Freq: Two times a day (BID) | CUTANEOUS | 0 refills | Status: AC
  Filled 2023-02-06: qty 30, 15d supply, fill #0

## 2023-02-20 ENCOUNTER — Other Ambulatory Visit (HOSPITAL_COMMUNITY): Payer: Self-pay

## 2023-02-26 DIAGNOSIS — R7309 Other abnormal glucose: Secondary | ICD-10-CM | POA: Diagnosis not present

## 2023-02-26 DIAGNOSIS — F419 Anxiety disorder, unspecified: Secondary | ICD-10-CM | POA: Diagnosis not present

## 2023-02-26 DIAGNOSIS — F32A Depression, unspecified: Secondary | ICD-10-CM | POA: Diagnosis not present

## 2023-02-26 DIAGNOSIS — I10 Essential (primary) hypertension: Secondary | ICD-10-CM | POA: Diagnosis not present

## 2023-02-26 DIAGNOSIS — E7800 Pure hypercholesterolemia, unspecified: Secondary | ICD-10-CM | POA: Diagnosis not present

## 2023-03-21 ENCOUNTER — Other Ambulatory Visit: Payer: Self-pay

## 2023-03-22 ENCOUNTER — Other Ambulatory Visit (HOSPITAL_COMMUNITY): Payer: Self-pay

## 2023-03-22 ENCOUNTER — Other Ambulatory Visit: Payer: Self-pay

## 2023-04-03 ENCOUNTER — Other Ambulatory Visit (HOSPITAL_COMMUNITY): Payer: Self-pay

## 2023-05-01 ENCOUNTER — Other Ambulatory Visit (HOSPITAL_COMMUNITY): Payer: Self-pay

## 2023-05-24 ENCOUNTER — Other Ambulatory Visit: Payer: Self-pay

## 2023-05-24 ENCOUNTER — Other Ambulatory Visit (HOSPITAL_COMMUNITY): Payer: Self-pay

## 2023-05-25 ENCOUNTER — Other Ambulatory Visit (HOSPITAL_COMMUNITY): Payer: Self-pay

## 2023-05-26 ENCOUNTER — Other Ambulatory Visit (HOSPITAL_COMMUNITY): Payer: Self-pay

## 2023-05-26 MED ORDER — HYDROCHLOROTHIAZIDE 12.5 MG PO TABS
12.5000 mg | ORAL_TABLET | Freq: Every day | ORAL | 11 refills | Status: AC
  Filled 2023-06-14: qty 30, 30d supply, fill #0

## 2023-05-26 MED ORDER — PROPRANOLOL HCL 20 MG PO TABS
20.0000 mg | ORAL_TABLET | Freq: Two times a day (BID) | ORAL | 3 refills | Status: DC
  Filled 2023-05-26: qty 180, 90d supply, fill #0

## 2023-05-26 MED ORDER — SERTRALINE HCL 50 MG PO TABS
50.0000 mg | ORAL_TABLET | Freq: Every day | ORAL | 11 refills | Status: AC
  Filled 2023-05-26: qty 30, 30d supply, fill #0

## 2023-05-27 ENCOUNTER — Other Ambulatory Visit: Payer: Self-pay

## 2023-05-27 ENCOUNTER — Other Ambulatory Visit (HOSPITAL_COMMUNITY): Payer: Self-pay

## 2023-06-14 ENCOUNTER — Other Ambulatory Visit: Payer: Self-pay

## 2023-06-14 ENCOUNTER — Other Ambulatory Visit (HOSPITAL_COMMUNITY): Payer: Self-pay

## 2023-06-14 MED ORDER — ALPRAZOLAM 0.25 MG PO TABS
0.2500 mg | ORAL_TABLET | Freq: Two times a day (BID) | ORAL | 5 refills | Status: DC | PRN
  Filled 2023-06-14: qty 60, 30d supply, fill #0
  Filled 2023-09-02: qty 60, 30d supply, fill #1

## 2023-06-15 ENCOUNTER — Other Ambulatory Visit (HOSPITAL_BASED_OUTPATIENT_CLINIC_OR_DEPARTMENT_OTHER): Payer: Self-pay

## 2023-06-18 ENCOUNTER — Other Ambulatory Visit: Payer: Self-pay

## 2023-07-01 ENCOUNTER — Other Ambulatory Visit (HOSPITAL_COMMUNITY): Payer: Self-pay

## 2023-07-01 ENCOUNTER — Other Ambulatory Visit: Payer: Self-pay

## 2023-07-01 DIAGNOSIS — I10 Essential (primary) hypertension: Secondary | ICD-10-CM | POA: Diagnosis not present

## 2023-07-01 DIAGNOSIS — R7309 Other abnormal glucose: Secondary | ICD-10-CM | POA: Diagnosis not present

## 2023-07-01 DIAGNOSIS — Z79899 Other long term (current) drug therapy: Secondary | ICD-10-CM | POA: Diagnosis not present

## 2023-07-01 DIAGNOSIS — F32A Depression, unspecified: Secondary | ICD-10-CM | POA: Diagnosis not present

## 2023-07-01 DIAGNOSIS — F419 Anxiety disorder, unspecified: Secondary | ICD-10-CM | POA: Diagnosis not present

## 2023-07-01 DIAGNOSIS — Z23 Encounter for immunization: Secondary | ICD-10-CM | POA: Diagnosis not present

## 2023-07-01 DIAGNOSIS — E7800 Pure hypercholesterolemia, unspecified: Secondary | ICD-10-CM | POA: Diagnosis not present

## 2023-07-01 DIAGNOSIS — D126 Benign neoplasm of colon, unspecified: Secondary | ICD-10-CM | POA: Diagnosis not present

## 2023-07-01 MED ORDER — PROPRANOLOL HCL ER 60 MG PO CP24
60.0000 mg | ORAL_CAPSULE | Freq: Every day | ORAL | 3 refills | Status: DC
  Filled 2023-08-21: qty 90, 90d supply, fill #0

## 2023-07-01 MED ORDER — BUSPIRONE HCL 10 MG PO TABS
10.0000 mg | ORAL_TABLET | Freq: Two times a day (BID) | ORAL | 11 refills | Status: DC
  Filled 2023-07-01: qty 60, 30d supply, fill #0

## 2023-07-01 MED ORDER — TRAZODONE HCL 50 MG PO TABS
50.0000 mg | ORAL_TABLET | Freq: Every day | ORAL | 3 refills | Status: AC
  Filled 2023-07-01: qty 90, 90d supply, fill #0
  Filled 2024-03-23: qty 90, 90d supply, fill #1

## 2023-07-01 MED ORDER — SERTRALINE HCL 50 MG PO TABS
50.0000 mg | ORAL_TABLET | Freq: Every day | ORAL | 3 refills | Status: DC
  Filled 2023-07-01: qty 90, 90d supply, fill #0
  Filled 2024-02-10: qty 90, 90d supply, fill #1

## 2023-07-01 MED ORDER — AMLODIPINE BESYLATE 5 MG PO TABS
5.0000 mg | ORAL_TABLET | Freq: Every day | ORAL | 3 refills | Status: DC
  Filled 2023-07-01 – 2023-07-30 (×2): qty 90, 90d supply, fill #0
  Filled 2023-10-28: qty 90, 90d supply, fill #1
  Filled 2024-01-26: qty 90, 90d supply, fill #2
  Filled 2024-04-27: qty 90, 90d supply, fill #3

## 2023-07-17 DIAGNOSIS — Z79899 Other long term (current) drug therapy: Secondary | ICD-10-CM | POA: Diagnosis not present

## 2023-07-30 ENCOUNTER — Other Ambulatory Visit: Payer: Self-pay

## 2023-07-30 ENCOUNTER — Other Ambulatory Visit (HOSPITAL_COMMUNITY): Payer: Self-pay

## 2023-08-21 ENCOUNTER — Other Ambulatory Visit: Payer: Self-pay

## 2023-08-21 ENCOUNTER — Other Ambulatory Visit (HOSPITAL_COMMUNITY): Payer: Self-pay

## 2023-08-21 MED ORDER — ONDANSETRON HCL 4 MG PO TABS
4.0000 mg | ORAL_TABLET | Freq: Three times a day (TID) | ORAL | 5 refills | Status: AC | PRN
  Filled 2023-08-21: qty 30, 10d supply, fill #0
  Filled 2024-02-10: qty 18, 6d supply, fill #1
  Filled 2024-03-23: qty 18, 6d supply, fill #2
  Filled 2024-04-27: qty 18, 6d supply, fill #3
  Filled 2024-07-01: qty 18, 6d supply, fill #4
  Filled 2024-08-03: qty 18, 6d supply, fill #5
  Filled 2024-08-17: qty 18, 21d supply, fill #6

## 2023-09-02 ENCOUNTER — Other Ambulatory Visit: Payer: Self-pay

## 2023-09-02 ENCOUNTER — Other Ambulatory Visit (HOSPITAL_COMMUNITY): Payer: Self-pay

## 2023-10-28 ENCOUNTER — Encounter: Payer: Self-pay | Admitting: Hematology and Oncology

## 2023-10-28 ENCOUNTER — Other Ambulatory Visit: Payer: Self-pay

## 2023-10-29 ENCOUNTER — Other Ambulatory Visit: Payer: Self-pay

## 2023-10-30 ENCOUNTER — Other Ambulatory Visit (HOSPITAL_COMMUNITY): Payer: Self-pay

## 2023-11-05 ENCOUNTER — Other Ambulatory Visit: Payer: Self-pay

## 2023-11-05 ENCOUNTER — Other Ambulatory Visit (HOSPITAL_COMMUNITY): Payer: Self-pay

## 2023-11-05 MED ORDER — PROPRANOLOL HCL ER 80 MG PO CP24
80.0000 mg | ORAL_CAPSULE | Freq: Every day | ORAL | 3 refills | Status: DC
  Filled 2023-11-05: qty 90, 90d supply, fill #0
  Filled 2024-01-26: qty 90, 90d supply, fill #1
  Filled 2024-04-27: qty 90, 90d supply, fill #2
  Filled 2024-07-24: qty 90, 90d supply, fill #3

## 2023-11-05 MED ORDER — PREDNISONE 10 MG PO TABS
ORAL_TABLET | ORAL | 0 refills | Status: DC
  Filled 2023-11-05: qty 21, 6d supply, fill #0

## 2023-11-05 MED ORDER — AZITHROMYCIN 250 MG PO TABS
ORAL_TABLET | ORAL | 0 refills | Status: AC
  Filled 2023-11-05: qty 6, 5d supply, fill #0

## 2023-11-05 MED ORDER — PSEUDOEPHEDRINE-GUAIFENESIN ER 60-600 MG PO TB12
ORAL_TABLET | ORAL | 0 refills | Status: AC
  Filled 2024-01-04: qty 10, fill #0

## 2024-01-04 ENCOUNTER — Other Ambulatory Visit (HOSPITAL_COMMUNITY): Payer: Self-pay

## 2024-01-04 ENCOUNTER — Other Ambulatory Visit (HOSPITAL_BASED_OUTPATIENT_CLINIC_OR_DEPARTMENT_OTHER): Payer: Self-pay

## 2024-01-04 MED ORDER — ALPRAZOLAM 0.25 MG PO TABS
0.2500 mg | ORAL_TABLET | Freq: Two times a day (BID) | ORAL | 5 refills | Status: AC | PRN
  Filled 2024-01-04: qty 60, 30d supply, fill #0
  Filled 2024-02-10: qty 60, 30d supply, fill #1
  Filled 2024-07-01: qty 60, 30d supply, fill #2

## 2024-01-06 ENCOUNTER — Other Ambulatory Visit: Payer: Self-pay

## 2024-01-26 ENCOUNTER — Other Ambulatory Visit (HOSPITAL_COMMUNITY): Payer: Self-pay

## 2024-01-27 ENCOUNTER — Other Ambulatory Visit: Payer: Self-pay

## 2024-01-27 ENCOUNTER — Other Ambulatory Visit (HOSPITAL_COMMUNITY): Payer: Self-pay

## 2024-01-27 MED ORDER — HYDROCHLOROTHIAZIDE 12.5 MG PO TABS
12.5000 mg | ORAL_TABLET | Freq: Every day | ORAL | 3 refills | Status: AC
  Filled 2024-01-27: qty 90, 90d supply, fill #0
  Filled 2024-04-27: qty 90, 90d supply, fill #1
  Filled 2024-07-24: qty 90, 90d supply, fill #2
  Filled 2024-10-21: qty 90, 90d supply, fill #3

## 2024-01-28 ENCOUNTER — Other Ambulatory Visit: Payer: Self-pay

## 2024-02-10 ENCOUNTER — Other Ambulatory Visit (HOSPITAL_COMMUNITY): Payer: Self-pay

## 2024-02-10 ENCOUNTER — Other Ambulatory Visit: Payer: Self-pay

## 2024-03-05 ENCOUNTER — Other Ambulatory Visit: Payer: Self-pay

## 2024-03-05 MED ORDER — NA SULFATE-K SULFATE-MG SULF 17.5-3.13-1.6 GM/177ML PO SOLN
1.0000 | ORAL | 0 refills | Status: DC
  Filled 2024-03-05: qty 354, 1d supply, fill #0

## 2024-03-23 ENCOUNTER — Other Ambulatory Visit (HOSPITAL_COMMUNITY): Payer: Self-pay

## 2024-03-24 ENCOUNTER — Other Ambulatory Visit (HOSPITAL_COMMUNITY): Payer: Self-pay

## 2024-03-24 ENCOUNTER — Encounter: Payer: Self-pay | Admitting: Pharmacist

## 2024-03-24 ENCOUNTER — Other Ambulatory Visit: Payer: Self-pay

## 2024-04-27 ENCOUNTER — Other Ambulatory Visit (HOSPITAL_COMMUNITY): Payer: Self-pay

## 2024-07-01 ENCOUNTER — Other Ambulatory Visit (HOSPITAL_COMMUNITY): Payer: Self-pay

## 2024-07-02 ENCOUNTER — Other Ambulatory Visit: Payer: Self-pay

## 2024-07-24 ENCOUNTER — Other Ambulatory Visit: Payer: Self-pay

## 2024-07-24 MED ORDER — AMLODIPINE BESYLATE 5 MG PO TABS
5.0000 mg | ORAL_TABLET | Freq: Every day | ORAL | 1 refills | Status: AC
  Filled 2024-07-24: qty 90, 90d supply, fill #0
  Filled 2024-10-21: qty 90, 90d supply, fill #1

## 2024-07-30 ENCOUNTER — Encounter: Payer: Self-pay | Admitting: Hematology and Oncology

## 2024-07-30 ENCOUNTER — Ambulatory Visit (INDEPENDENT_AMBULATORY_CARE_PROVIDER_SITE_OTHER): Payer: Self-pay

## 2024-07-30 DIAGNOSIS — K640 First degree hemorrhoids: Secondary | ICD-10-CM | POA: Diagnosis not present

## 2024-07-30 DIAGNOSIS — K5730 Diverticulosis of large intestine without perforation or abscess without bleeding: Secondary | ICD-10-CM | POA: Diagnosis not present

## 2024-07-30 DIAGNOSIS — Z09 Encounter for follow-up examination after completed treatment for conditions other than malignant neoplasm: Secondary | ICD-10-CM | POA: Diagnosis present

## 2024-07-30 DIAGNOSIS — Z860100 Personal history of colon polyps, unspecified: Secondary | ICD-10-CM | POA: Diagnosis not present

## 2024-08-03 ENCOUNTER — Other Ambulatory Visit: Payer: Self-pay

## 2024-08-05 ENCOUNTER — Other Ambulatory Visit: Payer: Self-pay

## 2024-08-05 MED ORDER — SERTRALINE HCL 50 MG PO TABS
50.0000 mg | ORAL_TABLET | Freq: Every day | ORAL | 3 refills | Status: AC
  Filled 2024-08-05: qty 90, 90d supply, fill #0

## 2024-08-06 ENCOUNTER — Other Ambulatory Visit: Payer: Self-pay

## 2024-08-18 ENCOUNTER — Other Ambulatory Visit: Payer: Self-pay

## 2024-08-18 MED ORDER — TIZANIDINE HCL 4 MG PO TABS
4.0000 mg | ORAL_TABLET | Freq: Three times a day (TID) | ORAL | 3 refills | Status: AC | PRN
  Filled 2024-08-18: qty 60, 20d supply, fill #0

## 2024-08-20 ENCOUNTER — Other Ambulatory Visit: Payer: Self-pay

## 2024-10-21 ENCOUNTER — Other Ambulatory Visit (HOSPITAL_COMMUNITY): Payer: Self-pay

## 2024-10-21 ENCOUNTER — Other Ambulatory Visit: Payer: Self-pay

## 2024-10-21 ENCOUNTER — Encounter: Payer: Self-pay | Admitting: Pharmacist

## 2024-10-21 MED ORDER — PROPRANOLOL HCL ER 80 MG PO CP24
80.0000 mg | ORAL_CAPSULE | Freq: Every day | ORAL | 3 refills | Status: AC
  Filled 2024-10-21 (×2): qty 90, 90d supply, fill #0
  Filled 2024-10-21: qty 30, 30d supply, fill #0
